# Patient Record
Sex: Male | Born: 1945 | Race: White | Hispanic: No | Smoking: Never smoker
Health system: Southern US, Community
[De-identification: ages and names within clinical notes are randomized; demographics above are authoritative.]

## PROBLEM LIST (undated history)

## (undated) DIAGNOSIS — H349 Unspecified retinal vascular occlusion: Secondary | ICD-10-CM

## (undated) DIAGNOSIS — I480 Paroxysmal atrial fibrillation: Secondary | ICD-10-CM

## (undated) DIAGNOSIS — E119 Type 2 diabetes mellitus without complications: Secondary | ICD-10-CM

## (undated) DIAGNOSIS — E785 Hyperlipidemia, unspecified: Secondary | ICD-10-CM

## (undated) DIAGNOSIS — F0390 Unspecified dementia without behavioral disturbance: Secondary | ICD-10-CM

## (undated) DIAGNOSIS — I2510 Atherosclerotic heart disease of native coronary artery without angina pectoris: Secondary | ICD-10-CM

## (undated) DIAGNOSIS — I469 Cardiac arrest, cause unspecified: Secondary | ICD-10-CM

## (undated) DIAGNOSIS — L570 Actinic keratosis: Secondary | ICD-10-CM

## (undated) DIAGNOSIS — I428 Other cardiomyopathies: Secondary | ICD-10-CM

## (undated) DIAGNOSIS — N200 Calculus of kidney: Secondary | ICD-10-CM

## (undated) DIAGNOSIS — I10 Essential (primary) hypertension: Secondary | ICD-10-CM

## (undated) DIAGNOSIS — Z95810 Presence of automatic (implantable) cardiac defibrillator: Secondary | ICD-10-CM

## (undated) DIAGNOSIS — M1990 Unspecified osteoarthritis, unspecified site: Secondary | ICD-10-CM

## (undated) DIAGNOSIS — C801 Malignant (primary) neoplasm, unspecified: Secondary | ICD-10-CM

## (undated) DIAGNOSIS — C439 Malignant melanoma of skin, unspecified: Secondary | ICD-10-CM

## (undated) DIAGNOSIS — I4891 Unspecified atrial fibrillation: Secondary | ICD-10-CM

## (undated) DIAGNOSIS — J45909 Unspecified asthma, uncomplicated: Secondary | ICD-10-CM

## (undated) HISTORY — PX: JOINT REPLACEMENT: SHX530

## (undated) HISTORY — PX: CYSTOSCOPY W/ LITHOLAPAXY / EHL: SUR377

## (undated) HISTORY — PX: VARICOSE VEIN SURGERY: SHX832

## (undated) HISTORY — DX: Paroxysmal atrial fibrillation: I48.0

## (undated) HISTORY — PX: TOTAL KNEE ARTHROPLASTY: SHX125

## (undated) HISTORY — PX: TOTAL KNEE ARTHROPLASTY WITH REVISION COMPONENTS: SHX6198

## (undated) HISTORY — DX: Unspecified retinal vascular occlusion: H34.9

## (undated) HISTORY — PX: OTHER SURGICAL HISTORY: SHX169

## (undated) HISTORY — PX: TONSILLECTOMY: SUR1361

## (undated) HISTORY — DX: Actinic keratosis: L57.0

---

## 2004-07-09 ENCOUNTER — Ambulatory Visit: Payer: Self-pay | Admitting: General Surgery

## 2004-08-08 ENCOUNTER — Ambulatory Visit: Payer: Self-pay

## 2006-09-10 ENCOUNTER — Other Ambulatory Visit: Payer: Self-pay

## 2006-09-10 ENCOUNTER — Ambulatory Visit: Payer: Self-pay | Admitting: Urology

## 2006-09-11 ENCOUNTER — Ambulatory Visit: Payer: Self-pay | Admitting: Urology

## 2006-10-02 ENCOUNTER — Ambulatory Visit: Payer: Self-pay | Admitting: Urology

## 2006-10-09 ENCOUNTER — Ambulatory Visit: Payer: Self-pay | Admitting: Urology

## 2008-08-29 DIAGNOSIS — C4491 Basal cell carcinoma of skin, unspecified: Secondary | ICD-10-CM

## 2008-08-29 HISTORY — DX: Basal cell carcinoma of skin, unspecified: C44.91

## 2008-11-28 ENCOUNTER — Ambulatory Visit: Payer: Self-pay | Admitting: Orthopedic Surgery

## 2008-12-01 ENCOUNTER — Inpatient Hospital Stay: Payer: Self-pay | Admitting: Orthopedic Surgery

## 2009-01-24 ENCOUNTER — Ambulatory Visit: Payer: Self-pay | Admitting: Orthopedic Surgery

## 2009-07-05 ENCOUNTER — Ambulatory Visit: Payer: Self-pay | Admitting: Orthopedic Surgery

## 2009-07-13 ENCOUNTER — Inpatient Hospital Stay: Payer: Self-pay | Admitting: Orthopedic Surgery

## 2009-10-19 ENCOUNTER — Ambulatory Visit: Payer: Self-pay | Admitting: Orthopedic Surgery

## 2010-12-24 ENCOUNTER — Ambulatory Visit: Payer: Self-pay | Admitting: Gastroenterology

## 2010-12-26 LAB — PATHOLOGY REPORT

## 2012-04-13 ENCOUNTER — Ambulatory Visit: Payer: Self-pay | Admitting: Gastroenterology

## 2012-04-14 LAB — PATHOLOGY REPORT

## 2014-03-30 ENCOUNTER — Other Ambulatory Visit: Payer: Self-pay | Admitting: Physician Assistant

## 2014-03-30 ENCOUNTER — Ambulatory Visit: Payer: Self-pay | Admitting: Student

## 2014-03-30 ENCOUNTER — Encounter (HOSPITAL_COMMUNITY): Payer: Self-pay | Admitting: Physician Assistant

## 2014-03-30 ENCOUNTER — Inpatient Hospital Stay (HOSPITAL_COMMUNITY)
Admission: AD | Admit: 2014-03-30 | Discharge: 2014-04-03 | DRG: 226 | Disposition: A | Discharge status: Home / Self Care | Payer: Medicare Other | Source: Other Acute Inpatient Hospital | Attending: Cardiology | Admitting: Cardiology

## 2014-03-30 ENCOUNTER — Encounter: Payer: Self-pay | Admitting: Cardiology

## 2014-03-30 DIAGNOSIS — E785 Hyperlipidemia, unspecified: Secondary | ICD-10-CM | POA: Diagnosis present

## 2014-03-30 DIAGNOSIS — Z96652 Presence of left artificial knee joint: Secondary | ICD-10-CM | POA: Diagnosis present

## 2014-03-30 DIAGNOSIS — J45909 Unspecified asthma, uncomplicated: Secondary | ICD-10-CM | POA: Diagnosis present

## 2014-03-30 DIAGNOSIS — I10 Essential (primary) hypertension: Secondary | ICD-10-CM | POA: Diagnosis present

## 2014-03-30 DIAGNOSIS — I4901 Ventricular fibrillation: Principal | ICD-10-CM | POA: Diagnosis present

## 2014-03-30 DIAGNOSIS — Z95810 Presence of automatic (implantable) cardiac defibrillator: Secondary | ICD-10-CM

## 2014-03-30 DIAGNOSIS — I469 Cardiac arrest, cause unspecified: Secondary | ICD-10-CM | POA: Diagnosis present

## 2014-03-30 DIAGNOSIS — I495 Sick sinus syndrome: Secondary | ICD-10-CM

## 2014-03-30 DIAGNOSIS — R7401 Elevation of levels of liver transaminase levels: Secondary | ICD-10-CM

## 2014-03-30 DIAGNOSIS — I429 Cardiomyopathy, unspecified: Secondary | ICD-10-CM | POA: Diagnosis present

## 2014-03-30 DIAGNOSIS — G9340 Encephalopathy, unspecified: Secondary | ICD-10-CM | POA: Diagnosis present

## 2014-03-30 DIAGNOSIS — R740 Nonspecific elevation of levels of transaminase and lactic acid dehydrogenase [LDH]: Secondary | ICD-10-CM | POA: Diagnosis present

## 2014-03-30 DIAGNOSIS — R9431 Abnormal electrocardiogram [ECG] [EKG]: Secondary | ICD-10-CM

## 2014-03-30 DIAGNOSIS — E119 Type 2 diabetes mellitus without complications: Secondary | ICD-10-CM | POA: Diagnosis present

## 2014-03-30 DIAGNOSIS — Z79899 Other long term (current) drug therapy: Secondary | ICD-10-CM

## 2014-03-30 DIAGNOSIS — I472 Ventricular tachycardia: Secondary | ICD-10-CM | POA: Diagnosis present

## 2014-03-30 DIAGNOSIS — I428 Other cardiomyopathies: Secondary | ICD-10-CM

## 2014-03-30 DIAGNOSIS — I2510 Atherosclerotic heart disease of native coronary artery without angina pectoris: Secondary | ICD-10-CM

## 2014-03-30 HISTORY — DX: Calculus of kidney: N20.0

## 2014-03-30 HISTORY — DX: Unspecified asthma, uncomplicated: J45.909

## 2014-03-30 HISTORY — DX: Essential (primary) hypertension: I10

## 2014-03-30 HISTORY — PX: CARDIOVERSION: SHX1299

## 2014-03-30 HISTORY — DX: Other cardiomyopathies: I42.8

## 2014-03-30 HISTORY — DX: Atherosclerotic heart disease of native coronary artery without angina pectoris: I25.10

## 2014-03-30 HISTORY — DX: Type 2 diabetes mellitus without complications: E11.9

## 2014-03-30 HISTORY — DX: Hyperlipidemia, unspecified: E78.5

## 2014-03-30 HISTORY — DX: Presence of automatic (implantable) cardiac defibrillator: Z95.810

## 2014-03-30 HISTORY — DX: Cardiac arrest, cause unspecified: I46.9

## 2014-03-30 HISTORY — DX: Unspecified osteoarthritis, unspecified site: M19.90

## 2014-03-30 HISTORY — PX: CARDIAC CATHETERIZATION: SHX172

## 2014-03-30 LAB — COMPREHENSIVE METABOLIC PANEL
ALBUMIN: 3.5 g/dL (ref 3.4–5.0)
ALK PHOS: 100 U/L
ALT: 165 U/L — AB
Anion Gap: 13 (ref 7–16)
BUN: 18 mg/dL (ref 7–18)
Bilirubin,Total: 2.2 mg/dL — ABNORMAL HIGH (ref 0.2–1.0)
CALCIUM: 8.1 mg/dL — AB (ref 8.5–10.1)
CHLORIDE: 105 mmol/L (ref 98–107)
CO2: 21 mmol/L (ref 21–32)
Creatinine: 1.15 mg/dL (ref 0.60–1.30)
EGFR (African American): >60
EGFR (Non-African Amer.): >60
Glucose: 191 mg/dL — ABNORMAL HIGH (ref 65–99)
Osmolality: 285 (ref 275–301)
Potassium: 3.5 mmol/L (ref 3.5–5.1)
SGOT(AST): 199 U/L — ABNORMAL HIGH (ref 15–37)
Sodium: 139 mmol/L (ref 136–145)
Total Protein: 6.5 g/dL (ref 6.4–8.2)

## 2014-03-30 LAB — CBC
HCT: 44.8 % (ref 40.0–52.0)
HCT: 41.6 % (ref 39.0–52.0)
HGB: 14.8 g/dL (ref 13.0–18.0)
Hemoglobin: 14.5 g/dL (ref 13.0–17.0)
MCH: 30.8 pg (ref 26.0–34.0)
MCH: 31.4 pg (ref 26.0–34.0)
MCHC: 33 g/dL (ref 32.0–36.0)
MCHC: 34.9 g/dL (ref 30.0–36.0)
MCV: 93 fL (ref 80–100)
MCV: 90 fL (ref 78.0–100.0)
PLATELETS: 225 10*3/uL (ref 150–440)
Platelets: 194 10*3/uL (ref 150–400)
RBC: 4.8 10*6/uL (ref 4.40–5.90)
RBC: 4.62 MIL/uL (ref 4.22–5.81)
RDW: 13.5 % (ref 11.5–14.5)
RDW: 13.1 % (ref 11.5–15.5)
WBC: 10.5 10*3/uL (ref 3.8–10.6)
WBC: 13.4 10*3/uL — ABNORMAL HIGH (ref 4.0–10.5)

## 2014-03-30 LAB — TROPONIN I: Troponin-I: 0.04 ng/mL

## 2014-03-30 LAB — PROTIME-INR
INR: 1.1
PROTHROMBIN TIME: 13.8 s (ref 11.5–14.7)

## 2014-03-30 LAB — GLUCOSE, CAPILLARY: Glucose-Capillary: 109 mg/dL — ABNORMAL HIGH (ref 70–99)

## 2014-03-30 LAB — CK TOTAL AND CKMB (NOT AT ARMC)
CK, Total: 107 U/L (ref 39–308)
CK-MB: 3.1 ng/mL (ref 0.5–3.6)

## 2014-03-30 LAB — CREATININE, SERUM
CREATININE: 1.17 mg/dL (ref 0.50–1.35)
GFR, EST AFRICAN AMERICAN: 71 mL/min — AB (ref >90)
GFR, EST NON AFRICAN AMERICAN: 61 mL/min — AB (ref >90)

## 2014-03-30 LAB — APTT: Activated PTT: 33.9 secs (ref 23.6–35.9)

## 2014-03-30 LAB — MRSA PCR SCREENING: MRSA BY PCR: NEGATIVE

## 2014-03-30 MED ORDER — NITROGLYCERIN 0.4 MG SL SUBL: 0.4000 mg | SUBLINGUAL_TABLET | SUBLINGUAL | Status: DC | PRN

## 2014-03-30 MED ORDER — BUDESONIDE-FORMOTEROL FUMARATE 160-4.5 MCG/ACT IN AERO
2.0000 | INHALATION_SPRAY | Freq: Two times a day (BID) | RESPIRATORY_TRACT | Status: DC
  Administered 2014-03-31 – 2014-04-03 (×6): 2 via RESPIRATORY_TRACT
  Filled 2014-03-30 (×2): qty 6

## 2014-03-30 MED ORDER — OXYCODONE-ACETAMINOPHEN 5-325 MG PO TABS
1.0000 | ORAL_TABLET | Freq: Four times a day (QID) | ORAL | Status: DC | PRN
  Administered 2014-03-30 – 2014-04-02 (×4): 1 via ORAL
  Filled 2014-03-30 (×5): qty 1

## 2014-03-30 MED ORDER — MORPHINE SULFATE 2 MG/ML IJ SOLN: 2.0000 mg | INTRAMUSCULAR | Status: DC | PRN

## 2014-03-30 MED ORDER — SODIUM CHLORIDE 0.9 % IJ SOLN: 3.0000 mL | INTRAMUSCULAR | Status: DC | PRN

## 2014-03-30 MED ORDER — LISINOPRIL 5 MG PO TABS
5.0000 mg | ORAL_TABLET | Freq: Every day | ORAL | Status: DC
  Filled 2014-03-30 (×2): qty 1

## 2014-03-30 MED ORDER — ACETAMINOPHEN 325 MG PO TABS
650.0000 mg | ORAL_TABLET | ORAL | Status: DC | PRN
  Administered 2014-03-30 – 2014-04-02 (×2): 650 mg via ORAL
  Filled 2014-03-30: qty 2

## 2014-03-30 MED ORDER — SODIUM CHLORIDE 0.9 % IV SOLN
250.0000 mL | INTRAVENOUS | Status: DC | PRN
  Administered 2014-03-30: 250 mL via INTRAVENOUS

## 2014-03-30 MED ORDER — SODIUM CHLORIDE 0.9 % IJ SOLN
3.0000 mL | Freq: Two times a day (BID) | INTRAMUSCULAR | Status: DC
Start: 2014-03-30 — End: 2014-04-03
  Administered 2014-03-30 – 2014-04-02 (×6): 3 mL via INTRAVENOUS

## 2014-03-30 MED ORDER — METFORMIN HCL 500 MG PO TABS
500.0000 mg | ORAL_TABLET | Freq: Two times a day (BID) | ORAL | Status: DC
  Administered 2014-04-01: 500 mg via ORAL
  Filled 2014-03-30 (×4): qty 1

## 2014-03-30 MED ORDER — ONDANSETRON HCL 4 MG/2ML IJ SOLN: 4.0000 mg | Freq: Four times a day (QID) | INTRAMUSCULAR | Status: DC | PRN

## 2014-03-30 MED ORDER — ONDANSETRON HCL 4 MG/2ML IJ SOLN
4.0000 mg | Freq: Four times a day (QID) | INTRAMUSCULAR | Status: DC | PRN
  Administered 2014-03-30: 4 mg via INTRAVENOUS

## 2014-03-30 MED ORDER — AMIODARONE HCL IN DEXTROSE 360-4.14 MG/200ML-% IV SOLN: 60.0000 mg/h | INTRAVENOUS | Status: DC

## 2014-03-30 MED ORDER — CETYLPYRIDINIUM CHLORIDE 0.05 % MT LIQD
7.0000 mL | Freq: Two times a day (BID) | OROMUCOSAL | Status: DC
  Administered 2014-03-30 – 2014-04-02 (×7): 7 mL via OROMUCOSAL

## 2014-03-30 MED ORDER — CARVEDILOL 3.125 MG PO TABS
3.1250 mg | ORAL_TABLET | Freq: Two times a day (BID) | ORAL | Status: DC
  Administered 2014-03-31 – 2014-04-03 (×7): 3.125 mg via ORAL
  Filled 2014-03-30 (×11): qty 1

## 2014-03-30 MED ORDER — AMIODARONE HCL IN DEXTROSE 360-4.14 MG/200ML-% IV SOLN: 30.0000 mg/h | INTRAVENOUS | Status: DC

## 2014-03-30 MED ORDER — DOCUSATE SODIUM 100 MG PO CAPS
100.0000 mg | ORAL_CAPSULE | Freq: Every day | ORAL | Status: DC
  Administered 2014-04-01 – 2014-04-03 (×3): 100 mg via ORAL
  Filled 2014-03-30 (×5): qty 1

## 2014-03-30 MED ORDER — ATORVASTATIN CALCIUM 40 MG PO TABS
40.0000 mg | ORAL_TABLET | Freq: Every day | ORAL | Status: DC
Start: 2014-03-30 — End: 2014-04-03
  Administered 2014-03-31 – 2014-04-02 (×2): 40 mg via ORAL
  Filled 2014-03-30 (×5): qty 1

## 2014-03-30 MED ORDER — HEPARIN SODIUM (PORCINE) 5000 UNIT/ML IJ SOLN
5000.0000 [IU] | Freq: Three times a day (TID) | INTRAMUSCULAR | Status: DC
  Administered 2014-03-30 – 2014-03-31 (×2): 5000 [IU] via SUBCUTANEOUS
  Filled 2014-03-30 (×5): qty 1

## 2014-03-30 MED ORDER — ONDANSETRON HCL 4 MG/2ML IJ SOLN
INTRAMUSCULAR | Status: AC
  Administered 2014-03-30: 4 mg via INTRAVENOUS
  Filled 2014-03-30: qty 2

## 2014-03-30 NOTE — H&P (Signed)
History and Physical  Patient ID: Ethan Vega MRN: [id number], DOB: [birth year] Date of Encounter: 03/30/2014, 2:29 PM Primary Physician: No primary care provider on file. Primary Cardiologist: New to [facility]  Chief Complaint: Cardiac arrest  Reason for Admission: Cardiac arrest (found in v-fib by EMS)  HPI: 69 y.o. male with no prior cardiac history, but with h/o DM2, HTN, HLD, and asthma who was [place] at the mall today with a head contusion and received CPR via bystander until EMS arrival. Upon EMS arrival he was found to be in V-fib, received shock x 2, went into V-tach, followed by NSR and was brought to [facility] ED.   Patient was in his usual state of health this morning while he was at the mall then all of the sudden he became dizzy and could no longer see. This was followed by him passing out. He suffered a head contusion with a laceration over the left eye in this process of him hitting the ground. He received CPR from a bystander at the mall. Upon EMS arrival he was found to be in V-fib. He received shock x 2, went into V-tach, followed by NSR and was brought to [facility] ED. EKG shows irregular rhythm, 101, RBBB, nonspecific st/t changes in lateral leads.   Upon his arrival to [facility] ED he received amiodarone bolus 150 mg and was placed on full-dose amiodarone gtt. He also received heparin bolus. Initial troponin 0.04. CK-MB 3.1. He was sent for head CT, stat read negative for bleed. While in CT he went back into v-tach. He was brought back into ED room 16. He was confused and lethargic. The decision was made to avoid sedation given his somnolence/lethargy. Given his recent cardiac arrest and urgent need for cardiac catheterization the choice was made for immediate synchronized cardioversion at 100 J. He promptly went into NSR with a rate in the 60s-70s. His mentation improved. Unfortunately, his vision remained poor. He was rebolused with 150 mg of amiodarone. He was taken to the cardiac cath lab.  He remained in contact with a provider.  Past Medical History  Diagnosis Date  . DM2 (diabetes mellitus, type 2)   . HTN (hypertension)   . HLD (hyperlipidemia)   . Asthma   . Coronary artery disease, non-occlusive     a. cath 03/2014: normal cors, EF 35%     Most Recent Cardiac Studies: Please see packet   Surgical History: No past surgical history on file.   Home Meds: Prior to Admission medications   Not on File    Allergies: Allergies not on file  History   Social History  . Marital Status: Divorced    Spouse Name: N/A    Number of Children: N/A  . Years of Education: N/A   Occupational History  . Not on file.   Social History Main Topics  . Smoking status: Not on file  . Smokeless tobacco: Not on file  . Alcohol Use: Not on file  . Drug Use: Not on file  . Sexual Activity: Not on file   Other Topics Concern  . Not on file   Social History Narrative  . No narrative on file     No family history on file.  Review of Systems: Unable to obtain 2/2 patient's mental status.   Labs: Please see packet that accompanied patient.     Radiology/Studies:  No results found.   EKG: irregular rhythm, 101, RBBB, nonspecific lateral st/t changes   Physical Exam: BP:  149/100, pulse 104, temp 97.2, resp 24, sat 97% General: Well developed, well nourished, critically ill appearing.  Head: Normocephalic, trauma over left eye, sclera non-icteric, no xanthomas, nares are without discharge.  Neck: Negative for carotid bruits. JVD not elevated. Lungs: Clear bilaterally to auscultation without wheezes, rales, or rhonchi. Breathing is unlabored. Heart: RRR with S1 S2. No murmurs, rubs, or gallops appreciated. Abdomen: Soft, non-tender, non-distended with normoactive bowel sounds. No hepatomegaly. No rebound/guarding. No obvious abdominal masses. Msk:  Strength and tone appear normal for age. Extremities: No clubbing or cyanosis. No edema.  Distal pedal pulses are 2+  and equal bilaterally. Neuro: Alert at times, lethargic mostly. No focal deficit. No facial asymmetry. Moves all extremities spontaneously.  Psych:  Lethargic.     ASSESSMENT AND PLAN:  69 year old male with no prior cardiac history, but with h/o HTN, HLD, and asthma who was found down with cardiac arrest at the mall today with a head contusion and received CPR via bystander until EMS arrival. Upon EMS arrival he was found to be in V-fib, received shock x 2, went into V-tach, followed by NSR and was brought to [facility] ED.   1. Cardiac arrest: -Back into v-tach while in CT, 2/2 his head trauma and lethargy sedation was held. Given his recent cardiac arrest, urgent need for cardiac cath, and declining response in mental status the decision to proceed with synchronized cardioversion at 100 J rather than rebolus of amiodarone was made. Called Dr. Ellyn Hack to discuss case with him who wanted patient out of v-tach then brought over to cath lab. Dr. Ellyn Hack came over to evaluate the patient in the ED as well. He converted to NSR at rate of 70s with 100J with synchronized cardioversion and remained there. -He was rebolused with 150 mg IV amiodarone -He was transported to cardiac cath lab  -Heparin gtt started in cath lab (head CT negative for bleed) -Cardiac cath showed normal coronaries with an EF of 35% -He will be transferred to [facility] for further evaluation including EP   2. Nonischemic cardiomyopathy: -EF 35% -Check echo -EP to evaluate -Coreg 3.125 mg bid and lisinopril 5 mg daily added  3. Encephalopathy: -Head CT negative -May need MRI  -Consider ETOH, UDS -No obvious signs of infection at this time  4. HTN: -Mildly elevated at presentation (149/100 during the above) -Monitor   5. HLD: -Change simvastatin to Lipitor 40 mg    6. Asthma: -Continue inhalers   Signed, DUNN,RYAN PA-C 03/30/2014, 2:29 PM   I personally saw & examined the patient @ [facility] ER - he had just been Cardioverted  for a recurrent run of Sustained VT.  He was quite confused, but awake & arousable.  No c/o CP besides from CPR. Apparently had suffered a witnessed arrest - on-looker CPR.  He fell & hit his head -- Head CT negative for bleed.  He was taken Emergently to the Cardiac CATH Lab to exclude ACS related ischemic VT.  To expedite matters (had a R wrist IV), a femoral approach was chosen.  Minimal CAD noted, but LV Gram EF ~35% --> cannot be sure if the cardiomyopathy preceded or was a result of VT Arrest.    With primary VT event & cardiomyopathy - needs EP evaluation.   Plan is transfer to [facility] for evaluation & treatment.  He was treated in ER with Amiodarone bolus & infusion, 2d bolus given for recurrent VT.  Post cath, he noted MSK related chest pain &  nausea.  Was given IV Morphine & BP/HR droppped wth nausea.  His BP dropped to 80s & HR to 40s in [place] -- Amiodarone infusion was stoppped.  IVF bolus & Zofran given.   Was stable for transport by [facility] to [facility].  Agree with the remainder of History, findings & exam / plan as noted above.   Leonie Man, M.D., M.S. Interventional Cardiologist   Pager # [phone]

## 2014-03-31 ENCOUNTER — Encounter (HOSPITAL_COMMUNITY): Payer: Self-pay | Admitting: *Deleted

## 2014-03-31 ENCOUNTER — Encounter (HOSPITAL_COMMUNITY)
Admission: AD | Disposition: A | Discharge status: Home / Self Care | Payer: Medicare Other | Source: Other Acute Inpatient Hospital | Attending: Interventional Cardiology

## 2014-03-31 ENCOUNTER — Encounter: Payer: Self-pay | Admitting: Cardiology

## 2014-03-31 DIAGNOSIS — I4901 Ventricular fibrillation: Principal | ICD-10-CM

## 2014-03-31 DIAGNOSIS — Z95810 Presence of automatic (implantable) cardiac defibrillator: Secondary | ICD-10-CM

## 2014-03-31 DIAGNOSIS — I059 Rheumatic mitral valve disease, unspecified: Secondary | ICD-10-CM

## 2014-03-31 DIAGNOSIS — I429 Cardiomyopathy, unspecified: Secondary | ICD-10-CM

## 2014-03-31 DIAGNOSIS — I469 Cardiac arrest, cause unspecified: Secondary | ICD-10-CM

## 2014-03-31 HISTORY — PX: IMPLANTABLE CARDIOVERTER DEFIBRILLATOR IMPLANT: SHX5473

## 2014-03-31 HISTORY — DX: Presence of automatic (implantable) cardiac defibrillator: Z95.810

## 2014-03-31 LAB — COMPREHENSIVE METABOLIC PANEL
ALK PHOS: 66 U/L (ref 39–117)
ALT: 104 U/L — ABNORMAL HIGH (ref 0–53)
AST: 112 U/L — AB (ref 0–37)
Albumin: 3.5 g/dL (ref 3.5–5.2)
Anion gap: 17 — ABNORMAL HIGH (ref 5–15)
BILIRUBIN TOTAL: 2.9 mg/dL — AB (ref 0.3–1.2)
BUN: 16 mg/dL (ref 6–23)
CO2: 25 mmol/L (ref 19–32)
Calcium: 9.5 mg/dL (ref 8.4–10.5)
Chloride: 99 mEq/L (ref 96–112)
Creatinine, Ser: 1.07 mg/dL (ref 0.50–1.35)
GFR calc Af Amer: 79 mL/min — ABNORMAL LOW (ref >90)
GFR calc non Af Amer: 68 mL/min — ABNORMAL LOW (ref >90)
Glucose, Bld: 124 mg/dL — ABNORMAL HIGH (ref 70–99)
POTASSIUM: 4.1 mmol/L (ref 3.5–5.1)
SODIUM: 141 mmol/L (ref 135–145)
Total Protein: 5.9 g/dL — ABNORMAL LOW (ref 6.0–8.3)

## 2014-03-31 LAB — HEMOGLOBIN A1C
HEMOGLOBIN A1C: 5.5 % (ref <5.7)
Mean Plasma Glucose: 111 mg/dL (ref <117)

## 2014-03-31 LAB — TSH: TSH: 0.985 u[IU]/mL (ref 0.350–4.500)

## 2014-03-31 LAB — GLUCOSE, CAPILLARY
Glucose-Capillary: 55 mg/dL — ABNORMAL LOW (ref 70–99)
Glucose-Capillary: 97 mg/dL (ref 70–99)

## 2014-03-31 LAB — BRAIN NATRIURETIC PEPTIDE: B NATRIURETIC PEPTIDE 5: 549.6 pg/mL — AB (ref 0.0–100.0)

## 2014-03-31 LAB — MAGNESIUM: MAGNESIUM: 1.9 mg/dL (ref 1.5–2.5)

## 2014-03-31 SURGERY — IMPLANTABLE CARDIOVERTER DEFIBRILLATOR IMPLANT
Anesthesia: LOCAL

## 2014-03-31 MED ORDER — FENTANYL CITRATE 0.05 MG/ML IJ SOLN
INTRAMUSCULAR | Status: AC
  Filled 2014-03-31: qty 2

## 2014-03-31 MED ORDER — ONDANSETRON HCL 4 MG/2ML IJ SOLN: 4.0000 mg | Freq: Four times a day (QID) | INTRAMUSCULAR | Status: DC | PRN

## 2014-03-31 MED ORDER — PERFLUTREN LIPID MICROSPHERE
INTRAVENOUS | Status: AC
  Filled 2014-03-31: qty 10

## 2014-03-31 MED ORDER — CHLORHEXIDINE GLUCONATE 4 % EX LIQD
60.0000 mL | Freq: Once | CUTANEOUS | Status: AC
  Administered 2014-03-31: 4 via TOPICAL
  Filled 2014-03-31: qty 60

## 2014-03-31 MED ORDER — LISINOPRIL 10 MG PO TABS
10.0000 mg | ORAL_TABLET | Freq: Every day | ORAL | Status: DC
  Administered 2014-04-01 – 2014-04-02 (×2): 10 mg via ORAL
  Filled 2014-03-31 (×2): qty 1

## 2014-03-31 MED ORDER — CEFAZOLIN SODIUM 1-5 GM-% IV SOLN
1.0000 g | Freq: Four times a day (QID) | INTRAVENOUS | Status: AC
  Administered 2014-03-31 – 2014-04-01 (×3): 1 g via INTRAVENOUS
  Filled 2014-03-31 (×3): qty 50

## 2014-03-31 MED ORDER — HEPARIN (PORCINE) IN NACL 2-0.9 UNIT/ML-% IJ SOLN
INTRAMUSCULAR | Status: AC
Start: 2014-03-31 — End: 2014-03-31
  Filled 2014-03-31: qty 500

## 2014-03-31 MED ORDER — SODIUM CHLORIDE 0.9 % IV SOLN
INTRAVENOUS | Status: DC
  Administered 2014-03-31: 09:00:00 via INTRAVENOUS

## 2014-03-31 MED ORDER — SODIUM CHLORIDE 0.9 % IR SOLN
80.0000 mg | Status: DC
  Filled 2014-03-31: qty 2

## 2014-03-31 MED ORDER — CEFAZOLIN SODIUM-DEXTROSE 2-3 GM-% IV SOLR
2.0000 g | INTRAVENOUS | Status: DC
  Filled 2014-03-31: qty 50

## 2014-03-31 MED ORDER — LIDOCAINE HCL (PF) 1 % IJ SOLN
INTRAMUSCULAR | Status: AC
  Filled 2014-03-31: qty 30

## 2014-03-31 MED ORDER — ACETAMINOPHEN 325 MG PO TABS
325.0000 mg | ORAL_TABLET | ORAL | Status: DC | PRN
Start: 2014-03-31 — End: 2014-04-03
  Administered 2014-04-01: 650 mg via ORAL
  Filled 2014-03-31 (×2): qty 2

## 2014-03-31 MED ORDER — MIDAZOLAM HCL 5 MG/5ML IJ SOLN
INTRAMUSCULAR | Status: AC
  Filled 2014-03-31: qty 5

## 2014-03-31 MED ORDER — PERFLUTREN LIPID MICROSPHERE
1.0000 mL | INTRAVENOUS | Status: AC | PRN
  Administered 2014-03-31: 2 mL via INTRAVENOUS
  Filled 2014-03-31: qty 10

## 2014-03-31 MED FILL — Ondansetron HCl Inj 4 MG/2ML (2 MG/ML): INTRAMUSCULAR | Qty: 2 | Status: AC

## 2014-03-31 NOTE — Progress Notes (Signed)
Percocet taken from pyxis around 2300 and was contaminated and had to be wasted in sharps. Witnessed by [place] RN. Patient became nauseated and vomited after drinking some water. Percocet was held and order obtained for Zofran IV. After nausea passed, percocet was given for pain.

## 2014-03-31 NOTE — Consult Note (Signed)
ELECTROPHYSIOLOGY CONSULT NOTE    Patient ID: Ethan Vega: [id number], DOB/AGE: [birth year] 69 y.o.  Admit date: 03/30/2014 Date of Consult: 03-31-2014  Primary Physician: Ethan Napoleon, MD Primary Cardiologist: new to [facility]  Reason for Consultation: cardiac arrest  HPI:  Ethan Vega is a 69 y.o. male with a past medical history significant for hypertension, hyperlipidemia, arthritis, and diabetes who suffered an OOH arrest on 03-30-14.  He was walking at the mall when he had a moment of dizziness and collapsed.  He received bystander CPR and EMS was called.  On their arrival, he was in VF and was defibrillated X2 and transferred to [facility].  He had recurrent VT while getting head CT and was cardioverted emergently and placed on amiodarone.  Cardiac catheterization demonstrated non obstructive CAD with an EF of 35%.  He had bradycardia on amiodarone with rates in the 40's and amio was discontinued.  He was transferred to [facility] for further evaluation.   Prior to this event, his activity is limited by arthritis in his knee.  He denies chest pain, shortness of breath, palpitations, pre-syncope. He has had one prior episode of syncope when he was 14 having dental work done.  His father died suddenly at age 71 of "heart attack" but no autopsy was done. There is no other family history of sudden death.    Echocardiogram is pending.  EP has been asked to evaluate for treatment options.  ROS is negative except as outlined above.   Past Medical History  Diagnosis Date  . HTN (hypertension)   . HLD (hyperlipidemia)   . Asthma   . Coronary artery disease, non-occlusive     a. cath 03/2014: normal cors, EF 35%  . Cardiac arrest 03/30/2014     found down at the mall with a head contusion and received CPR via bystander   . Heart murmur   . DM2 (diabetes mellitus, type 2)     "not since losing weight and exercising" (03/30/2014)  . Arthritis     "left knee" (03/30/2014)  .  Kidney stones      Surgical History:  Past Surgical History  Procedure Laterality Date  . Tonsillectomy      "as a kid"  . Total knee arthroplasty Left ~ 2010  . Total knee arthroplasty with revision components Left ~ 2011  . Joint replacement    . Cystoscopy w/ litholapaxy / ehl    . Cardioversion  03/30/2014  . Varicose vein surgery Left ~ 2014  . Cardiac catheterization  03/30/2014     Prescriptions prior to admission  Medication Sig Dispense Refill Last Dose  . lisinopril (PRINIVIL,ZESTRIL) 10 MG tablet Take 10 mg by mouth daily.   03/30/2014 at Unknown time  . meloxicam (MOBIC) 15 MG tablet Take 15 mg by mouth daily.   03/30/2014 at Unknown time    Inpatient Medications:  . antiseptic oral rinse  7 mL Mouth Rinse BID  . atorvastatin  40 mg Oral q1800  . budesonide-formoterol  2 puff Inhalation BID  . carvedilol  3.125 mg Oral BID WC  . docusate sodium  100 mg Oral Daily  . lisinopril  5 mg Oral Daily  . [START ON 04/01/2014] metFORMIN  500 mg Oral BID WC  . sodium chloride  3 mL Intravenous Q12H    Allergies: No Known Allergies  History   Social History  . Marital Status: Divorced    Spouse Name: N/A    Number of  Children: N/A  . Years of Education: N/A   Occupational History  . retired     Furniture conservator/restorer at [employer]  . Smoking status: Passive Smoke Exposure - Never Smoker  . Smokeless tobacco: Never Used  . Alcohol Use: No  . Drug Use: No  . Sexual Activity: Yes   Other Topics Concern  . Not on file   Social History Narrative     Family History  Problem Relation Age of Onset  . Esophageal cancer Mother     died at 27  . Heart attack Father     died at 43  . Hypertension Brother     BP 101/82 mmHg  Pulse 61  Temp(Src) 98.4 F (36.9 C) (Oral)  Resp 21  Ht 5\' 6"  (1.676 m)  Wt 201 lb 1 oz (91.2 kg)  BMI 32.47 kg/m2  SpO2 92%  Physical Exam: Well appearing 69 yo man, NAD HEENT: Unremarkable,[place], AT Neck:   6 JVD, no thyromegally Back:  No CVA tenderness Lungs:  Clear with no wheezes, rales, or rhonchi HEART:  Regular rate rhythm, no murmurs, no rubs, no clicks Abd:  soft, positive bowel sounds, no organomegally, no rebound, no guarding Ext:  2 plus pulses, no edema, no cyanosis, no clubbing Skin:  No rashes no nodules Neuro:  CN II through XII intact, motor grossly intact   Labs:   Lab Results  Component Value Date   WBC 13.4* 03/30/2014   HGB 14.5 03/30/2014   HCT 41.6 03/30/2014   MCV 90.0 03/30/2014   PLT 194 03/30/2014     Recent Labs Lab 03/30/14 1941  CREATININE 1.17    Radiology/Studies: No results found.  EKG:nsr  TELEMETRY: sinus rhythm with NSVT   A/P 1. VF arrest 2. Non-ischemic cm 3. HTN 4. Dyslipidemia 5. Sinus node dysfunction Rec: I have discussed the treatment options with the patient. The risks/benefits/goals/expectations of ICD implantation have been discussed with the patient and he wishes to proceed. He will require long term CHF meds. He was bradycardic on medical therapy and will need an atrial lead as well.  Mikle Bosworth.D.

## 2014-03-31 NOTE — Care Management Note (Signed)
    Page 1 of 1   03/31/2014     8:49:56 AM CARE MANAGEMENT NOTE 03/31/2014  Patient:  KUNIO, CUMMISKEY   Account Number:  [id number]  Date Initiated:  03/31/2014  Documentation initiated by:  Elissa Hefty  Subjective/Objective Assessment:   adm w cardiac arrest     Action/Plan:   lives alone, pcp dr Elijio Miles   Anticipated DC Date:     Anticipated DC Plan:  HOME/SELF CARE         Choice offered to / List presented to:             Status of service:   Medicare Important Message given?   (If response is "NO", the following Medicare IM given date fields will be blank) Date Medicare IM given:   Medicare IM given by:   Date Additional Medicare IM given:   Additional Medicare IM given by:    Discharge Disposition:    Per UR Regulation:  Reviewed for med. necessity/level of care/duration of stay  If discussed at [place] of Stay Meetings, dates discussed:    Comments:

## 2014-03-31 NOTE — Progress Notes (Signed)
ELECTROPHYSIOLOGY PROCEDURE DISCHARGE SUMMARY    Patient ID: Ethan Vega,  MRN: [id number], DOB/AGE: 11/22/1945 69 y.o.  Admit date: 03/30/2014 Discharge date: 04/01/2014  Primary Care Physician: Volanda Napoleon, MD Primary Cardiologist: Rockey Situ (will be new at follow-up) Electrophysiologist: Caryl Comes (to be followed in [facility])  Primary Discharge Diagnosis:  VF arrest status post ICD implant this admission  Secondary Discharge Diagnosis:  1.  Arthritis 2.  Diet controlled diabetes 3.  Hypertension  No Known Allergies   Procedures This Admission:  1.  Implantation of a STJ dual chamber ICD on 03-31-14 by Dr Lovena Le.  The patient received a STJ ICD with model number 2500 right atrial lead, 7122 right ventricular lead.  DFT's were successful at 51 J.  There were no immediate post procedure complications. 2.  CXR on 04-01-14 demonstrated no pneumothorax status post device implantation.   Brief HPI/Hospital Course:  Ethan Vega is a 69 y.o. male with a past medical history significant for hypertension, diabetes, and arthritis.  He was in his usual state of health on the day of admission when he suffered an OOH cardiac arrest.  Bystander CPR was started and EMS was called. On their arrival, he was in VF and was defibrillated.  He was taken to [facility] for further evaluation.  He had recurrent hemodynamically unstable VT while in CT scanner and underwent emergent cardioversion.  Catheterization demonstrated non obstructive CAD with EF of 35%.  He was transferred to [facility] for EP evaluation.  He was seen by Dr Lovena Le who recommended ICD implant for secondary prevention.  Due to bradycardia with medical therapy at [facility], dual chamber ICD implant was planned.  Risks, benefits, and alternatives to ICD implantation were reviewed with the patient who wished to proceed.   The patient underwent implantation of a STJ dual chamber ICD with details as outlined above.   He was monitored on  telemetry overnight which demonstrated sinus rhythm with 1 run of NSVT.  Left chest was without hematoma or ecchymosis.  The device was interrogated and found to be functioning normally.  CXR was obtained and demonstrated no pneumothorax status post device implantation.  Wound care, arm mobility, and restrictions were reviewed with the patient.  Dr Lovena Le examined the patient and considered them stable for discharge to home.   The patient's discharge medications include an ACE-I (Lisinopril) and beta blocker (Coreg).   He will need TOC visit in 7-10 days (staff message sent to schedule). No driving for 6 months-patient aware.   Discharge Vitals: Blood pressure 132/75, pulse 60, temperature 98.6 F (37 C), temperature source Oral, resp. rate 15, height 5\' 6"  (1.676 m), weight 199 lb 6.4 oz (90.447 kg), SpO2 99 %.   Well developed and nourished in no acute distress HENT normal Neck supple with JVP-flat Clear Device pocket without hematoma or erythema.   Regular rate and rhythm, no murmurs or gallops Abd-soft with active BS No Clubbing cyanosis edema Skin-warm and dry A & Oriented  Grossly normal sensory and motor function  Labs:   Lab Results  Component Value Date   WBC 13.4* 03/30/2014   HGB 14.5 03/30/2014   HCT 41.6 03/30/2014   MCV 90.0 03/30/2014   PLT 194 03/30/2014     Recent Labs Lab 03/31/14 0830  NA 141  K 4.1  CL 99  CO2 25  BUN 16  CREATININE 1.07  CALCIUM 9.5  PROT 5.9*  BILITOT 2.9*  ALKPHOS 66  ALT 104*  AST 112*  GLUCOSE 124*   ECG with QT interval of 500 msec  CXR some retraction of the RV ICD lead Discharge Medications:    Medication List    ASK your doctor about these medications        lisinopril 10 MG tablet  Commonly known as:  PRINIVIL,ZESTRIL  Take 10 mg by mouth daily.     meloxicam 15 MG tablet  Commonly known as:  MOBIC  Take 15 mg by mouth daily.       Active Problems:   Cardiac arrest   EF nearly normalized  We have  unexplained VF with modest QT prolongation and notably low voltage without evidence of LVh by echo or effusion  Will discuss with Dr Elliot Cousin re drug therapy  With LV dysfunction would anticpate beta blocker and with unexplained and freqeutn ( 2 episodes in 24 hrs without trigger) VF would anticipate antiarrhythmic therapy  Now with atrial lead  Coreg i nitiated  Will give flecainide challenge today then begin amiodarone and anticipate discharge on sundayu

## 2014-03-31 NOTE — Progress Notes (Signed)
Echocardiogram 2D Echocardiogram with Definity has been performed.  Babe Anthis 03/31/2014, 10:56 AM

## 2014-03-31 NOTE — Progress Notes (Addendum)
Sling applied to left arm. Instructions given. Awake on own. Talking coherently. IV saline locked.

## 2014-03-31 NOTE — CV Procedure (Signed)
   SURGEON:  Cristopher Peru, MD      PREPROCEDURE DIAGNOSES:   1. Non-Ischemic cardiomyopathy, EF 35%  2. Ventricular fibrillation cardiac arrest      POSTPROCEDURE DIAGNOSES:  Same as pre-procedure     PROCEDURES:    1. ICD implantation.  2. Defibrillation threshold testing     INTRODUCTION:  Ethan Vega is a 69 y.o. male with an non-ischemic CM (EF 35%), s/p VF arrest. At this time, he meets criteria for ICD implantation for secondary prevention of sudden death.  The patient has a narrow QRS and does not meet criteria for revascularization.   The patient therefore  presents today for ICD implantation.      DESCRIPTION OF PROCEDURE:  Informed written consent was obtained and the patient was brought to the electrophysiology lab in the fasting state. The patient was adequately sedated with intravenous Versed, and fentanyl as outlined in the nursing report.  The patient's left chest was prepped and draped in the usual sterile fashion by the EP lab staff.  The skin overlying the left deltopectoral region was infiltrated with lidocaine for local analgesia.  A 5-cm incision was made over the left deltopectoral region.  A left subcutaneous defibrillator pocket was fashioned using a combination of sharp and blunt dissection.  Electrocautery was used to assure hemostasis.   RA/RV Lead Placement: The left axillary vein was cannulated with fluoroscopic visualization.  No contrast was required for this endeavor.  Through the left axillary vein, a St. Jude [id number]  (serial # [id number]  ) right atrial lead and a St. Jude [id number] (serial number [id number]) right ventricular defibrillator lead were advanced with fluoroscopic visualization into the right atrial appendage and right ventricular apical septal positions respectively.  Initial atrial lead P-waves measured 3.1 mV with an impedance of 459 ohms and a threshold of 0.7 volts at 0.5 milliseconds.  The right ventricular lead R-wave measured 8.3 mV with impedance  of 629 ohms and a threshold of 0.7 volts at 0.5 milliseconds.   The leads were secured to the pectoralis  fascia using #2 silk suture over the suture sleeves.  The pocket then  irrigated with copious gentamicin solution.  The leads were then  connected to a St. Jude (serial  Number [id number]) ICD.  The defibrillator was placed into the  pocket.  The pocket was then closed in 2 layers with 2.0 Vicryl suture  for the subcutaneous and subcuticular layers.  Steri-Strips and a  sterile dressing were then applied.   DFT Testing: Defibrillation Threshold testing was then performed. Ventricular fibrillation was induced with the fibber mode.  Adequate sensing of ventricular fibrillation was observed with minimal dropout with a programmed sensitivity of 1.11mV.  The patient was successfully defibrillated to sinus rhythm with a single 15 joules shock delivered from the device with an impedance of 57 ohms in a duration of 5 seconds.  The patient went into atrial fibrillation before transitioning into NSR.  There were no early apparent complications.   CONCLUSIONS:   1. Non-Ischemic cardiomyopathy with chronic [place] Heart Association class I heart failure with a rescucitated VF arrest.   2. Successful ICD implantation.   3. DFT less than or equal to 15 joules.   4. No early apparent complications.   Cristopher Peru, MD  3:29 PM 03/31/2014

## 2014-03-31 NOTE — H&P (View-Only) (Signed)
ELECTROPHYSIOLOGY CONSULT NOTE    Patient ID: Ethan Vega MRN: [id number], DOB/AGE: 04/01/1945 69 y.o.  Admit date: 03/30/2014 Date of Consult: 03-31-2014  Primary Physician: Volanda Napoleon, MD Primary Cardiologist: new to [facility]  Reason for Consultation: cardiac arrest  HPI:  WADSWORTH SKOLNICK is a 69 y.o. male with a past medical history significant for hypertension, hyperlipidemia, arthritis, and diabetes who suffered an OOH arrest on 03-30-14.  [place] when he had a moment of dizziness and collapsed.  He received bystander CPR and EMS was called.  On their arrival, he was in VF and was defibrillated X2 and transferred to [facility].  He had recurrent VT while getting head CT and was cardioverted emergently and placed on amiodarone.  Cardiac catheterization demonstrated non obstructive CAD with an EF of 35%.  He had bradycardia on amiodarone with rates in the 40's and amio was discontinued.  He was transferred to [facility] for further evaluation.   Prior to this event, his activity is limited by arthritis in his knee.  He denies chest pain, shortness of breath, palpitations, pre-syncope. He has had one prior episode of syncope when he was 14 having dental work done.  His father died suddenly at age 5 of "heart attack" but no autopsy was done. There is no other family history of sudden death.    Echocardiogram is pending.  EP has been asked to evaluate for treatment options.  ROS is negative except as outlined above.   Past Medical History  Diagnosis Date  . HTN (hypertension)   . HLD (hyperlipidemia)   . Asthma   . Coronary artery disease, non-occlusive     a. cath 03/2014: normal cors, EF 35%  . Cardiac arrest 03/30/2014     [place] the [place] with a [place] contusion and received CPR via bystander   . Heart murmur   . DM2 (diabetes mellitus, type 2)     "not since losing weight and exercising" (03/30/2014)  . Arthritis     "left knee" (03/30/2014)  .  Kidney stones      Surgical History:  Past Surgical History  Procedure Laterality Date  . Tonsillectomy      "as a kid"  . Total knee arthroplasty Left ~ 2010  . Total knee arthroplasty with revision components Left ~ 2011  . Joint replacement    . Cystoscopy w/ litholapaxy / ehl    . Cardioversion  03/30/2014  . Varicose vein surgery Left ~ 2014  . Cardiac catheterization  03/30/2014     Prescriptions prior to admission  Medication Sig Dispense Refill Last Dose  . lisinopril (PRINIVIL,ZESTRIL) 10 MG tablet Take 10 mg by mouth daily.   03/30/2014 at Unknown time  . meloxicam (MOBIC) 15 MG tablet Take 15 mg by mouth daily.   03/30/2014 at Unknown time    Inpatient Medications:  . antiseptic oral rinse  7 mL Mouth Rinse BID  . atorvastatin  40 mg Oral q1800  . budesonide-formoterol  2 puff Inhalation BID  . carvedilol  3.125 mg Oral BID WC  . docusate sodium  100 mg Oral Daily  . lisinopril  5 mg Oral Daily  . [START ON 04/01/2014] metFORMIN  500 mg Oral BID WC  . sodium chloride  3 mL Intravenous Q12H    Allergies: No Known Allergies  History   Social History  . Marital Status: Divorced    Spouse Name: N/A    Number of  Children: N/A  . Years of Education: N/A   Occupational History  . retired     Furniture conservator/restorer at [employer] Topics  . Smoking status: Passive Smoke Exposure - Never Smoker  . Smokeless tobacco: Never Used  . Alcohol Use: No  . Drug Use: No  . Sexual Activity: Yes   Other Topics Concern  . Not on file   Social History Narrative     Family History  Problem Relation Age of Onset  . Esophageal cancer Mother     died at 29  . Heart attack Father     died at 28  . Hypertension Brother     BP 101/82 mmHg  Pulse 61  Temp(Src) 98.4 F (36.9 C) (Oral)  Resp 21  Ht 5\' 6"  (1.676 m)  Wt 201 lb 1 oz (91.2 kg)  BMI 32.47 kg/m2  SpO2 92%  Physical Exam: Well appearing 69 yo man, NAD HEENT: Unremarkable,[place], AT Neck:   6 JVD, no thyromegally Back:  No CVA tenderness Lungs:  Clear with no wheezes, rales, or rhonchi HEART:  Regular rate rhythm, no murmurs, no rubs, no clicks Abd:  soft, positive bowel sounds, no organomegally, no rebound, no guarding Ext:  2 plus pulses, no edema, no cyanosis, no clubbing Skin:  No rashes no nodules Neuro:  CN II through XII intact, motor grossly intact   Labs:   Lab Results  Component Value Date   WBC 13.4* 03/30/2014   HGB 14.5 03/30/2014   HCT 41.6 03/30/2014   MCV 90.0 03/30/2014   PLT 194 03/30/2014     Recent Labs Lab 03/30/14 1941  CREATININE 1.17    Radiology/Studies: No results found.  EKG:nsr  TELEMETRY: sinus rhythm with NSVT   A/P 1. VF arrest 2. Non-ischemic cm 3. HTN 4. Dyslipidemia 5. Sinus node dysfunction Rec: I have discussed the treatment options with the patient. The risks/benefits/goals/expectations of ICD implantation have been discussed with the patient and he wishes to proceed. He will require long term CHF meds. He was bradycardic on medical therapy and will need an atrial lead as well.  Mikle Bosworth.D.

## 2014-03-31 NOTE — Interval H&P Note (Signed)
History and Physical Interval Note:  03/31/2014 1:49 PM  Ethan Vega  has presented today for surgery, with the diagnosis of cardiac arrest  The various methods of treatment have been discussed with the patient and family. After consideration of risks, benefits and other options for treatment, the patient has consented to  Procedure(s): IMPLANTABLE CARDIOVERTER DEFIBRILLATOR IMPLANT (N/A) as a surgical intervention .  The patient's history has been reviewed, patient examined, no change in status, stable for surgery.  I have reviewed the patient's chart and labs.  Questions were answered to the patient's satisfaction.     Cristopher Peru

## 2014-04-01 ENCOUNTER — Inpatient Hospital Stay (HOSPITAL_COMMUNITY): Payer: Medicare Other

## 2014-04-01 ENCOUNTER — Other Ambulatory Visit: Payer: Self-pay

## 2014-04-01 ENCOUNTER — Encounter (HOSPITAL_COMMUNITY): Payer: Self-pay | Admitting: Internal Medicine

## 2014-04-01 DIAGNOSIS — R740 Nonspecific elevation of levels of transaminase and lactic acid dehydrogenase [LDH]: Secondary | ICD-10-CM

## 2014-04-01 DIAGNOSIS — R7401 Elevation of levels of liver transaminase levels: Secondary | ICD-10-CM

## 2014-04-01 DIAGNOSIS — I495 Sick sinus syndrome: Secondary | ICD-10-CM

## 2014-04-01 MED ORDER — FLECAINIDE ACETATE 100 MG PO TABS
300.0000 mg | ORAL_TABLET | Freq: Once | ORAL | Status: AC
  Administered 2014-04-01: 300 mg via ORAL
  Filled 2014-04-01: qty 3

## 2014-04-01 MED ORDER — AMIODARONE HCL 200 MG PO TABS
400.0000 mg | ORAL_TABLET | Freq: Two times a day (BID) | ORAL | Status: DC
  Administered 2014-04-01 – 2014-04-03 (×4): 400 mg via ORAL
  Filled 2014-04-01 (×4): qty 2

## 2014-04-01 NOTE — Progress Notes (Signed)
Patient was given warm prune juice to help relieve symptoms of constipation. States he has not had a bowel movement in 2 days and usually goes every morning. Starting to feel uncomfortable. Will ask MD for prn laxative as well. [employer]

## 2014-04-01 NOTE — Progress Notes (Signed)
Inpatient Diabetes Program Recommendations  AACE/ADA: New Consensus Statement on Inpatient Glycemic Control (2013)  Target Ranges:  Prepandial:   less than 140 mg/dL      Peak postprandial:   less than 180 mg/dL (1-2 hours)      Critically ill patients:  140 - 180 mg/dL   Reason for assessment:   Diabetes history: Type 2, A1C 5.5% Outpatient Diabetes medications: none Current orders for Inpatient glycemic control: Metformin 500mg  bid  Spoke with RN regarding the need for Metformin for this patient (written on discharge orders as well). A1C is 5.5% and patient has not been taking oral diabetes medications at home. RN will speak to physician before patient is discharged home to clarify orders and question the need for Metformin.    Gentry Fitz, RN, BA, MHA, CDE Diabetes Coordinator Inpatient Diabetes Program  [phone] (Team Pager) [phone] [facility]) 04/01/2014 11:06 AM

## 2014-04-01 NOTE — Progress Notes (Signed)
Pt ambulated in hall without walker, gait steady, HR upper 90s, denies chest pain, no SOB noted.

## 2014-04-02 LAB — HEPATIC FUNCTION PANEL
ALK PHOS: 61 U/L (ref 39–117)
ALT: 57 U/L — ABNORMAL HIGH (ref 0–53)
AST: 57 U/L — AB (ref 0–37)
Albumin: 3.6 g/dL (ref 3.5–5.2)
Bilirubin, Direct: 0.3 mg/dL (ref 0.0–0.3)
Indirect Bilirubin: 2.5 mg/dL — ABNORMAL HIGH (ref 0.3–0.9)
TOTAL PROTEIN: 5.9 g/dL — AB (ref 6.0–8.3)
Total Bilirubin: 2.8 mg/dL — ABNORMAL HIGH (ref 0.3–1.2)

## 2014-04-02 MED ORDER — ACETAMINOPHEN-CODEINE #3 300-30 MG PO TABS: 1.0000 | ORAL_TABLET | Freq: Four times a day (QID) | ORAL | Status: DC | PRN

## 2014-04-02 NOTE — Progress Notes (Signed)
Patient Name: Ethan Vega      SUBJECTIVE S/[ cardiac arrest Started amio Without nausea or other complaint  Past Medical History  Diagnosis Date  . HTN (hypertension)   . HLD (hyperlipidemia)   . Asthma   . Coronary artery disease, non-occlusive     a. cath 03/2014: normal cors, EF 35%  . Cardiac arrest 03/30/2014     [place] with a [place] contusion and received CPR via bystander   . Heart murmur   . DM2 (diabetes mellitus, type 2)     "not since losing weight and exercising" (03/30/2014)  . Arthritis     "left knee" (03/30/2014)  . Kidney stones     Scheduled Meds:  Scheduled Meds: . amiodarone  400 mg Oral BID  . antiseptic oral rinse  7 mL Mouth Rinse BID  . atorvastatin  40 mg Oral q1800  . budesonide-formoterol  2 puff Inhalation BID  . carvedilol  3.125 mg Oral BID WC  . docusate sodium  100 mg Oral Daily  . lisinopril  10 mg Oral Daily  . metFORMIN  500 mg Oral BID WC  . sodium chloride  3 mL Intravenous Q12H   Continuous Infusions:  sodium chloride, acetaminophen, acetaminophen, morphine injection, nitroGLYCERIN, ondansetron (ZOFRAN) IV, ondansetron (ZOFRAN) IV, oxyCODONE-acetaminophen, sodium chloride    PHYSICAL EXAM Filed Vitals:   04/01/14 2057 04/02/14 0000 04/02/14 0454 04/02/14 0823  BP:   143/90 137/81  Pulse: 71  60 62  Temp:  98.3 F (36.8 C) 98.2 F (36.8 C)   TempSrc:  Oral Oral   Resp: 12  18   Height:      Weight:   197 lb 12.8 oz (89.721 kg)   SpO2: 98%  98%     Well developed and nourished in no acute distress HENT normal Neck supple with JVP-flat Clear Regular rate and rhythm, no murmurs or gallops Abd-soft with active BS No Clubbing cyanosis edema Skin-warm and dry A & Oriented  Grossly normal sensory and motor function   TELEMETRY: Reviewed telemetry pt in NSR AND some atrial pacing    Intake/Output Summary (Last 24 hours) at 04/02/14 0913 Last data filed at 04/02/14 0848  Gross per 24 hour   Intake    480 ml  Output    575 ml  Net    -95 ml    LABS: Basic Metabolic Panel:  Recent Labs Lab 03/30/14 1941 03/31/14 0830  NA  --  141  K  --  4.1  CL  --  99  CO2  --  25  GLUCOSE  --  124*  BUN  --  16  CREATININE 1.17 1.07  CALCIUM  --  9.5  MG  --  1.9   Cardiac Enzymes: No results for input(s): CKTOTAL, CKMB, CKMBINDEX, TROPONINI in the last 72 hours. CBC:  Recent Labs Lab 03/30/14 1941  WBC 13.4*  HGB 14.5  HCT 41.6  MCV 90.0  PLT 194   PROTIME: No results for input(s): LABPROT, INR in the last 72 hours. Liver Function Tests:  Recent Labs  03/31/14 0830  AST 112*  ALT 104*  ALKPHOS 66  BILITOT 2.9*  PROT 5.9*  ALBUMIN 3.5   No results for input(s): LIPASE, AMYLASE in the last 72 hours. BNP: BNP (last 3 results) No results for input(s): PROBNP in the last 8760 hours. D-Dimer: No results for input(s): DDIMER in the last 72 hours. Hemoglobin A1C:  Recent Labs  03/31/14 0830  HGBA1C 5.5   Fasting Lipid Panel: No results for input(s): CHOL, HDL, LDLCALC, TRIG, CHOLHDL, LDLDIRECT in the last 72 hours. Thyroid Function Tests:  Recent Labs  03/31/14 0830  TSH 0.985    Flecainide challenge negative  QT unimpressive    Has very unusual voltage pattern with low volts ? Infiltrative myopathy  ASSESSMENT AND PLAN:  Active Problems:   Cardiac arrest   Sinus node dysfunction   Elevated transaminase level repeat tranaminases  Continue amio  400 bid x 2qwks 200 bisd x 2weeks The  200 qd  anticpate home on Sunday Will need LFTs at wound check NO QT PROLONGING DRUGS   Signed, Virl Axe MD  04/02/2014

## 2014-04-03 ENCOUNTER — Other Ambulatory Visit: Payer: Self-pay | Admitting: Physician Assistant

## 2014-04-03 ENCOUNTER — Encounter (HOSPITAL_COMMUNITY): Payer: Self-pay | Admitting: Physician Assistant

## 2014-04-03 DIAGNOSIS — Z79899 Other long term (current) drug therapy: Secondary | ICD-10-CM

## 2014-04-03 DIAGNOSIS — I10 Essential (primary) hypertension: Secondary | ICD-10-CM

## 2014-04-03 DIAGNOSIS — R9431 Abnormal electrocardiogram [ECG] [EKG]: Secondary | ICD-10-CM

## 2014-04-03 DIAGNOSIS — R7401 Elevation of levels of liver transaminase levels: Secondary | ICD-10-CM

## 2014-04-03 DIAGNOSIS — Z95810 Presence of automatic (implantable) cardiac defibrillator: Secondary | ICD-10-CM

## 2014-04-03 DIAGNOSIS — R740 Nonspecific elevation of levels of transaminase and lactic acid dehydrogenase [LDH]: Secondary | ICD-10-CM

## 2014-04-03 DIAGNOSIS — I428 Other cardiomyopathies: Secondary | ICD-10-CM

## 2014-04-03 DIAGNOSIS — Z5181 Encounter for therapeutic drug level monitoring: Secondary | ICD-10-CM

## 2014-04-03 DIAGNOSIS — I469 Cardiac arrest, cause unspecified: Secondary | ICD-10-CM

## 2014-04-03 MED ORDER — LISINOPRIL 10 MG PO TABS: 10.0000 mg | ORAL_TABLET | Freq: Two times a day (BID) | ORAL | Status: DC

## 2014-04-03 MED ORDER — AMIODARONE HCL 400 MG PO TABS: ORAL_TABLET | ORAL | Status: DC

## 2014-04-03 MED ORDER — LISINOPRIL 10 MG PO TABS
10.0000 mg | ORAL_TABLET | Freq: Two times a day (BID) | ORAL | Status: DC
  Administered 2014-04-03: 10 mg via ORAL
  Filled 2014-04-03: qty 1

## 2014-04-03 MED ORDER — ACETAMINOPHEN 500 MG PO TABS: 1000.0000 mg | ORAL_TABLET | Freq: Four times a day (QID) | ORAL | Status: DC | PRN

## 2014-04-03 MED ORDER — OXYCODONE HCL 5 MG PO TABS: 5.0000 mg | ORAL_TABLET | Freq: Four times a day (QID) | ORAL | Status: DC | PRN

## 2014-04-03 MED ORDER — ATORVASTATIN CALCIUM 40 MG PO TABS: 40.0000 mg | ORAL_TABLET | Freq: Every day | ORAL | Status: DC

## 2014-04-03 MED ORDER — BUDESONIDE-FORMOTEROL FUMARATE 160-4.5 MCG/ACT IN AERO: 2.0000 | INHALATION_SPRAY | Freq: Two times a day (BID) | RESPIRATORY_TRACT | Status: DC

## 2014-04-03 MED ORDER — CARVEDILOL 3.125 MG PO TABS
3.1250 mg | ORAL_TABLET | Freq: Two times a day (BID) | ORAL | Status: DC
Start: 2014-04-03 — End: 2014-04-11

## 2014-04-03 NOTE — Discharge Instructions (Signed)
° ° °  Supplemental Discharge Instructions for  Defibrillator Patients  Activity No heavy lifting or vigorous activity with your left arm for 6 to 8 weeks.  Do not raise your left  arm above your head for one week.  Gradually raise your affected arm as drawn below.          1/22                              1/23                        1/24                      1/25       NO DRIVING for 6 months     ; you may begin driving on   Dr. Caryl Comes will tell you when       . WOUND CARE - Keep the wound area clean and dry.  Do not get this area wet for one week. No showers for one week; you may shower on   04/08/14           . - The tape/steri-strips on your wound will fall off; do not pull them off.  No bandage is needed on the site.  DO  NOT apply any creams, oils, or ointments to the wound area. - If you notice any drainage or discharge from the wound, any swelling or bruising at the site, or you develop a fever > 101? F after you are discharged home, call the office at once.  Special Instructions - You are still able to use cellular telephones; use the ear opposite the side where you have your pacemaker/defibrillator.  Avoid carrying your cellular phone near your device. - When traveling through airports, show security personnel your identification card to avoid being screened in the metal detectors.  Ask the security personnel to use the hand wand. - Avoid arc welding equipment, MRI testing (magnetic resonance imaging), TENS units (transcutaneous nerve stimulators).  Call the office for questions about other devices. - Avoid electrical appliances that are in poor condition or are not properly grounded. - Microwave ovens are safe to be near or to operate.  Additional information for defibrillator patients should your device go off: - If your device goes off ONCE and you feel fine afterward, notify the device clinic nurses. - If your device goes off ONCE and you do not feel well afterward, call  911. - If your device goes off TWICE, call 911. - If your device goes off THREE times in one day, call 911.  DO NOT DRIVE YOURSELF OR A FAMILY MEMBER WITH A DEFIBRILLATOR TO THE HOSPITAL--CALL 911.

## 2014-04-03 NOTE — Progress Notes (Signed)
Primary cardiologist:  Dr. Glenetta Hew [facility]) Primary electrophysiologist:  Dr. Virl Axe [facility])   Subjective:    69 y.o. male with a history of DM 2, HTN, HL and asthma. Admitted 03/30/14 after suffering a V. fib arrest in the community. He received CPR and was defibrillated 2 by EMS.  He was originally taken to [facility]. He had recurrent VT while undergoing head CT. Head CT was negative for bleed. He was placed on IV amiodarone. LHC demonstrated no significant CAD. EF noted to be 35%. He developed bradycardia and amiodarone was stopped. He was transferred to [facility] for [facility] evaluation. Echo with improved LVF (EF 50-55%).  Evaluated by EP (Dr. Lovena Le). Felt to meet criteria for ICD.  He underwent dual-chamber AICD implantation 03/31/14.  CHF medications have been titrated. He is now on beta blocker and is also back on amiodarone. LFTs elevated on admit.  Improved now.  Discharge on 04/03/14 is anticipated.  Today continues to note chest soreness related to CPR. He denies dyspnea, nausea, dizziness.  Objective:   Temp:  [98.1 F (36.7 C)-98.7 F (37.1 C)] 98.4 F (36.9 C) (01/17 0515) Pulse Rate:  [59-62] 60 (01/17 0515) Resp:  [16-18] 18 (01/17 0515) BP: (104-143)/(56-88) 143/83 mmHg (01/17 0515) SpO2:  [95 %-98 %] 98 % (01/17 0515) Weight:  [197 lb 8 oz (89.585 kg)] 197 lb 8 oz (89.585 kg) (01/17 0515) Last BM Date: 04/02/14  Filed Weights   04/01/14 0400 04/02/14 0454 04/03/14 0515  Weight: 199 lb 6.4 oz (90.447 kg) 197 lb 12.8 oz (89.721 kg) 197 lb 8 oz (89.585 kg)    Intake/Output Summary (Last 24 hours) at 04/03/14 0805 Last data filed at 04/03/14 0514  Gross per 24 hour  Intake    480 ml  Output    800 ml  Net   -320 ml    Telemetry:  Atrial paced, 1 ventricular couplet  Exam:  General:  NAD  HEENT:  Unremarkable  Lungs:  Clear to auscultation bilaterally, no wheezing, no rales  Cardiac:  RRR, no murmur, no edema, no  JVD  Abdomen:  Soft  MSK: No deformities  Neuro: No deficits  Psych: Normal affect   Lab Results:  Basic Metabolic Panel:  Recent Labs Lab 03/30/14 1941 03/31/14 0830  NA  --  141  K  --  4.1  CL  --  99  CO2  --  25  GLUCOSE  --  124*  BUN  --  16  CREATININE 1.17 1.07  CALCIUM  --  9.5  MG  --  1.9    Liver Function Tests:  Recent Labs Lab 03/31/14 0830 04/02/14 1300  AST 112* 57*  ALT 104* 57*  ALKPHOS 66 61  BILITOT 2.9* 2.8*  PROT 5.9* 5.9*  ALBUMIN 3.5 3.6    CBC:  Recent Labs Lab 03/30/14 1941  WBC 13.4*  HGB 14.5  HCT 41.6  MCV 90.0  PLT 194    Cardiac Enzymes: No results for input(s): CKTOTAL, CKMB, CKMBINDEX, TROPONINI in the last 168 hours.  BNP: No results for input(s): PROBNP in the last 8760 hours.  Coagulation: No results for input(s): INR in the last 168 hours.    Medications:   Scheduled Medications: . amiodarone  400 mg Oral BID  . antiseptic oral rinse  7 mL Mouth Rinse BID  . atorvastatin  40 mg Oral q1800  . budesonide-formoterol  2 puff Inhalation BID  . carvedilol  3.125  mg Oral BID WC  . docusate sodium  100 mg Oral Daily  . lisinopril  10 mg Oral Daily  . metFORMIN  500 mg Oral BID WC  . sodium chloride  3 mL Intravenous Q12H    Infusions:    PRN Medications: sodium chloride, acetaminophen, acetaminophen, acetaminophen-codeine, morphine injection, nitroGLYCERIN, ondansetron (ZOFRAN) IV, ondansetron (ZOFRAN) IV, oxyCODONE-acetaminophen, sodium chloride   Assessment:    Principal Problem:   Cardiac arrest Active Problems:   NICM (nonischemic cardiomyopathy)   S/P implantation of automatic cardioverter/defibrillator (AICD)   Sinus node dysfunction/bradycardia   Elevated transaminase level   HTN (hypertension)   Prolonged Q-T interval on ECG     Plan/Discussion:    Doing well. Just remain sore from CPR. He will likely need a prescription for oxycodone at discharge.   BP borderline elevated.   Will increase Lisinopril to 10 mg twice daily.  Will need FU BMET at wound check. Check LFTs at wound check visit. Per Dr. Virl Axe, will DC on Amio 400 twice daily x 2 weeks, then 200 twice daily x 2 weeks, then 200 mg daily. Avoid QT prolonging drugs.  FU:  Device clinic on [facility]. 1/25 at 4 pm.  Dr. Caryl Comes in [facility] 4/19.  Will need to arrange FU with MD/PA in [place] office in next 2-3 weeks for FU on CHF.  Anticipate DC today after seen by attending Cardiologist.   Signed,  Richardson Dopp, PA-C   04/03/2014 8:05 AM Pager [phone]    Personally seen and examined. Agree with above. Doing well. Still having some post CPR chest pain. Recommend Tylenol. Short course of oxycodone. Deep breathing can be helpful to stretch. Muscles. Avoid stretching arm above head with recent defibrillator placement. Agree with increasing lisinopril  Candee Furbish, MD

## 2014-04-03 NOTE — Discharge Summary (Signed)
Discharge Summary   Patient ID: Ethan Vega, MRN: [id number], DOB/AGE: [birth year] 69 y.o.  Admit date: 03/30/2014 Discharge date: 04/03/2014   Primary Care Physician:  [name] AHMED   Primary Cardiologist:  Dr. Glenetta Hew [facility]) Primary Electrophysiologist:  Dr. Virl Axe [facility])  Reason for Admission:  Cardiac Arrest   Primary Discharge Diagnoses:  Principal Problem:   Cardiac arrest Active Problems:   NICM (nonischemic cardiomyopathy)   S/P implantation of automatic cardioverter/defibrillator (AICD)   Sinus node dysfunction/bradycardia   Elevated transaminase level   HTN (hypertension)   Prolonged Q-T interval on ECG     Wt Readings from Last 3 Encounters:  04/03/14 197 lb 8 oz (89.585 kg)    Secondary Discharge Diagnoses:   Past Medical History  Diagnosis Date  . HTN (hypertension)   . HLD (hyperlipidemia)   . Asthma   . Coronary artery disease, non-occlusive     a. cath 03/2014 at [facility]: no sig CAD (OM1 30%, CFX 30%), EF 35%  . Cardiac arrest 03/30/2014    VF arrest in setting of NICM >> CPR/defib in community >> s/p dual chamber AICD  . DM2 (diabetes mellitus, type 2)     "not since losing weight and exercising" (03/30/2014)  . Arthritis     "left knee" (03/30/2014)  . Kidney stones   . AICD (automatic cardioverter/defibrillator) present 03/31/14    Dr. Lovena Le - [employer] Jude (serial  Number [id number]) ICD  . NICM (nonischemic cardiomyopathy)     a. echo 1/16:  Inferolateral and lateral HK, EF 93-26%, grade 1 diastolic dysfunction, mild MR, mild LAE, mild RAE, trivial TR, no effusion      Allergies:   No Known Allergies    Procedures Performed This Admission:   1. Cardiac catheterization at [facility] 03/30/14: No significant CAD 2. Dual-chamber AICD implantation 03/31/14 by Dr. Thayer Ohm Course:  Ethan Vega is a 69 y.o. male with a history of DM 2, HTN, HL and asthma. He was admitted 03/30/14 after suffering a  V. fib arrest in the community. He received CPR and was defibrillated 2 by EMS. He was originally taken to [facility]. He had recurrent VT while undergoing head CT. Head CT was negative for bleed. He was placed on IV amiodarone. Emergent LHC demonstrated no significant CAD. EF noted to be 35%. He developed bradycardia and amiodarone was stopped. He was transferred to [facility] for further evaluation. Echo at [facility] demonstrated improved LVF (EF 50-55%). He was evaluated by EP (Dr. Lovena Le). He was felt to meet criteria for ICD. He underwent dual-chamber AICD implantation 03/31/14. CHF medications have been titrated. He is now on beta blocker, ACEI and is also back on amiodarone. LFTs were elevated on admision. They are improved now. He has had continued chest discomfort from CPR relieved by oxycodone.  QTc was noted to be somewhat prolonged. QT prolonging drugs are to be avoided. He will follow-up with Dr. Caryl Comes in [place] as well as Dr. Ellyn Hack. He will need follow-up LFTs and BMET in the next week. He was seen by Dr. Marlou Porch this morning and felt to be stable for discharge to home.    Discharge Vitals:   Blood pressure 141/77, pulse 60, temperature 98.4 F (36.9 C), temperature source Oral, resp. rate 18, height 5\' 6"  (1.676 m), weight 197 lb 8 oz (89.585 kg), SpO2 98 %.   Labs:    Lab Results  Component Value Date  WBC 13.4* 03/30/2014   HGB 14.5 03/30/2014   HCT 41.6 03/30/2014   MCV 90.0 03/30/2014   PLT 194 03/30/2014     Lab Results  Component Value Date   CREATININE 1.07 03/31/2014   BUN 16 03/31/2014   NA 141 03/31/2014   K 4.1 03/31/2014   CL 99 03/31/2014   CO2 25 03/31/2014     Recent Labs  04/02/14 1300  PROT 5.9*  BILITOT 2.8*  ALKPHOS 61  ALT 57*  AST 57*   B NATRIURETIC PEPTIDE  Date/Time Value Ref Range Status  03/31/2014 08:30 AM 549.6* 0.0 - 100.0 pg/mL Final    Comment:    Please note change in reference range.       Lab Results  Component Value Date   TSH 0.985 03/31/2014    Diagnostic Procedures and Studies:  Dg Chest 2 View  04/01/2014   CLINICAL DATA:  AICD.  EXAM: CHEST  2 VIEW  COMPARISON:  03/30/2014 to  FINDINGS: AICD noted with lead tips in right atrium and right ventricle. Cardiomegaly with normal pulmonary vascularity. No focal infiltrate. No pleural effusion or pneumothorax. Degenerative changes thoracic spine.  IMPRESSION: AICD noted with lead tips in right atrium right ventricle. Cardiomegaly. No CHF.   Electronically Signed   By: Marcello Moores  Register   On: 04/01/2014 07:50     2D Echocardiogram 03/31/14  Study Conclusions  - Left ventricle: There appears to be mid inferolateral and distal lateral hypokinesis, overall LVEF is low normal 50-55%. The cavity size was mildly dilated. Systolic function was normal.  Wall motion was normal; there were no regional wall motion abnormalities. Doppler parameters are consistent with abnormal left ventricular relaxation (grade 1 diastolic dysfunction).  There was no evidence of elevated ventricular filling pressure by Doppler parameters. - Aortic valve: Trileaflet; normal thickness leaflets. There was no regurgitation. - Aortic root: The aortic root was normal in size. - Mitral valve: Structurally normal valve. There was mild regurgitation. - Left atrium: The atrium was mildly dilated. - Right ventricle: Systolic function was normal. - Right atrium: The atrium was mildly dilated. - Tricuspid valve: There was trivial regurgitation. - Pulmonary arteries: Systolic pressure was within the normal range. - Inferior vena cava: The vessel was dilated. The respirophasic diameter changes were blunted (< 50%), consistent with elevated central venous pressure. - Pericardium, extracardiac: There was no pericardial effusion.    Disposition:   Pt is being discharged home today in good condition.  Follow-up Plans & Appointments      Follow-up Information     Follow up with [facility] On 04/11/2014.   Why:  4:00 PM - , For wound re-check; You will need labs drawn:  LFTs, BMET   Contact information:   [place] [phone]       Follow up with Christell Faith, PA-C In 2 weeks.   Specialty:  Physician Assistant   Why:  The office will contact you to arrange an appointment with the PA for Dr. Ellyn Hack in [facility] information:   [place] [address] [phone]       Follow up with Virl Axe, MD On 07/05/2014.   Specialty:  Cardiology   Why:  10:15 am   Contact information:   [address] [place] [address] [phone]       Follow up with Volanda Napoleon, MD.   Specialty:  Internal Medicine   Why:  as planned   Contact information:   [place] [address] [phone]       Discharge Medications    Medication List    STOP taking these medications        meloxicam 15 MG tablet  Commonly known as:  MOBIC      TAKE these medications        acetaminophen 500 MG tablet  Commonly known as:  TYLENOL  Take 2 tablets (1,000 mg total) by mouth every 6 (six) hours as needed for mild pain.     amiodarone 400 MG tablet  Commonly known as:  PACERONE  Take one tab (400 mg) twice a day for 2 weeks, then take 1/2 tab (200 mg) twice a day for 2 weeks, then take 1/2 tab (200 mg) once daily     atorvastatin 40 MG tablet  Commonly known as:  LIPITOR  Take 1 tablet (40 mg total) by mouth daily at 6 PM.     budesonide-formoterol 160-4.5 MCG/ACT inhaler  Commonly known as:  SYMBICORT  Inhale 2 puffs into the lungs 2 (two) times daily.     carvedilol 3.125 MG tablet  Commonly known as:  COREG  Take 1 tablet (3.125 mg total) by mouth 2 (two) times daily with a meal.     lisinopril 10 MG tablet  Commonly known as:  PRINIVIL,ZESTRIL  Take 1 tablet (10 mg total) by mouth 2 (two) times daily.      oxyCODONE 5 MG immediate release tablet  Commonly known as:  Oxy IR/ROXICODONE  Take 1 tablet (5 mg total) by mouth every 6 (six) hours as needed for severe pain.         Outstanding Labs/Studies  1. BMET and LFTs on 1/25/165   Duration of Discharge Encounter: Greater than 30 minutes including physician and PA time.  Signed, Richardson Dopp, PA-C   04/03/2014 11:12 AM

## 2014-04-05 ENCOUNTER — Encounter: Payer: Self-pay | Admitting: Cardiology

## 2014-04-11 ENCOUNTER — Encounter: Payer: Self-pay | Admitting: Nurse Practitioner

## 2014-04-11 ENCOUNTER — Ambulatory Visit (INDEPENDENT_AMBULATORY_CARE_PROVIDER_SITE_OTHER): Payer: Medicare Other | Admitting: *Deleted

## 2014-04-11 ENCOUNTER — Ambulatory Visit (INDEPENDENT_AMBULATORY_CARE_PROVIDER_SITE_OTHER): Payer: Medicare Other | Admitting: Nurse Practitioner

## 2014-04-11 VITALS — BP 168/86 | HR 60 | Ht 66.0 in | Wt 206.4 lb

## 2014-04-11 DIAGNOSIS — I4581 Long QT syndrome: Secondary | ICD-10-CM

## 2014-04-11 DIAGNOSIS — Z95810 Presence of automatic (implantable) cardiac defibrillator: Secondary | ICD-10-CM

## 2014-04-11 DIAGNOSIS — I429 Cardiomyopathy, unspecified: Secondary | ICD-10-CM

## 2014-04-11 DIAGNOSIS — I428 Other cardiomyopathies: Secondary | ICD-10-CM

## 2014-04-11 DIAGNOSIS — R9431 Abnormal electrocardiogram [ECG] [EKG]: Secondary | ICD-10-CM

## 2014-04-11 DIAGNOSIS — I472 Ventricular tachycardia, unspecified: Secondary | ICD-10-CM

## 2014-04-11 DIAGNOSIS — I469 Cardiac arrest, cause unspecified: Secondary | ICD-10-CM

## 2014-04-11 DIAGNOSIS — I495 Sick sinus syndrome: Secondary | ICD-10-CM

## 2014-04-11 DIAGNOSIS — Z9889 Other specified postprocedural states: Secondary | ICD-10-CM

## 2014-04-11 LAB — MDC_IDC_ENUM_SESS_TYPE_INCLINIC
Battery Remaining Longevity: 69.6 mo
Brady Statistic RV Percent Paced: 0.01 %
Date Time Interrogation Session: 20160125162032
HighPow Impedance: 52.875
Lead Channel Impedance Value: 437.5 Ohm
Lead Channel Pacing Threshold Amplitude: 0.75 V
Lead Channel Pacing Threshold Amplitude: 0.75 V
Lead Channel Pacing Threshold Amplitude: 0.75 V
Lead Channel Pacing Threshold Pulse Width: 0.5 ms
Lead Channel Sensing Intrinsic Amplitude: 10.3 mV
Lead Channel Setting Pacing Amplitude: 3.5 V
Lead Channel Setting Pacing Amplitude: 3.5 V
Lead Channel Setting Pacing Pulse Width: 0.5 ms
Lead Channel Setting Sensing Sensitivity: 0.5 mV
MDC IDC MSMT LEADCHNL RA PACING THRESHOLD AMPLITUDE: 0.75 V
MDC IDC MSMT LEADCHNL RA PACING THRESHOLD PULSEWIDTH: 0.5 ms
MDC IDC MSMT LEADCHNL RA PACING THRESHOLD PULSEWIDTH: 0.5 ms
MDC IDC MSMT LEADCHNL RA SENSING INTR AMPL: 2.9 mV
MDC IDC MSMT LEADCHNL RV IMPEDANCE VALUE: 462.5 Ohm
MDC IDC MSMT LEADCHNL RV PACING THRESHOLD PULSEWIDTH: 0.5 ms
MDC IDC PG SERIAL: 7241695
MDC IDC STAT BRADY RA PERCENT PACED: 93 %
Zone Setting Detection Interval: 250 ms
Zone Setting Detection Interval: 300 ms
Zone Setting Detection Interval: 350 ms

## 2014-04-11 MED ORDER — CARVEDILOL 6.25 MG PO TABS: 6.2500 mg | ORAL_TABLET | Freq: Two times a day (BID) | ORAL | Status: DC

## 2014-04-11 NOTE — Progress Notes (Signed)
CARDIOLOGY OFFICE NOTE  Date:  04/11/2014    [name] Date of Birth: [birth year] Medical Record #[id number]  PCP:  Volanda Napoleon, MD  Cardiologist:  Randa Spike   Chief Complaint  Patient presents with  . Cardiac Arrest    Post hospital/TOC visit - seen for Dr. Ellyn Hack & Caryl Comes   History of Present Illness: Ethan Vega is a 69 y.o. male who presents today for a TOC/post hospital visit. He has not had a TOC phone call however. He is seen for Dr. Caryl Comes & Ellyn Hack. He has a history of DM 2, HTN, HL and asthma.   He was admitted 03/30/14 after suffering a V. fib arrest in the community. [place] when he had a moment of dizziness and collapsed. He received bystander CPR and EMS was called. On their arrival, he was in VF and was defibrillated X2 and transferred to [facility]. He had recurrent VT while undergoing head CT. Head CT was negative for bleed. He was placed on IV amiodarone. Emergent LHC demonstrated no significant CAD. EF noted to be 35%. He developed bradycardia and amiodarone was stopped. He was transferred to [facility] for further evaluation. Echo at [facility] demonstrated improved LVF (EF 50-55%). He was evaluated by EP (Dr. Lovena Le). He was felt to meet criteria for ICD. He underwent dual-chamber AICD implantation 03/31/14.  He was discharged on on beta blocker, ACEI and put back on amiodarone. LFTs were elevated on admission but improved at discharge.  He has had continued chest discomfort from CPR relieved by oxycodone. QTc was noted to be somewhat prolonged. QT prolonging drugs are to be avoided.   Comes in today. Here alone. Home a little over a week. Has done ok. He is not driving. He understands that this is for 6 months. Chest discomfort is improving. Not using the narcotic. Not short of breath. Not dizzy or lightheaded. Does not have a BP monitor at home. Restricting his salt. Seems to have a good understanding about his medicines.  He does use SBE since he has had prior knee replacement. Needs a permanent crown in about 4 weeks. Seeing his PCP next month as well. No fever or chills. Limiting his movement of the left arm.    Past Medical History  Diagnosis Date  . HTN (hypertension)   . HLD (hyperlipidemia)   . Asthma   . Coronary artery disease, non-occlusive     a. cath 03/2014 at [facility]: no sig CAD (OM1 30%, CFX 30%), EF 35%  . Cardiac arrest 03/30/2014    VF arrest in setting of NICM >> CPR/defib in community >> s/p dual chamber AICD  . DM2 (diabetes mellitus, type 2)     "not since losing weight and exercising" (03/30/2014)  . Arthritis     "left knee" (03/30/2014)  . Kidney stones   . AICD (automatic cardioverter/defibrillator) present 03/31/14    Dr. Lovena Le - St. Jude (serial  Number [id number]) ICD  . NICM (nonischemic cardiomyopathy)     a. echo 1/16:  Inferolateral and lateral HK, EF 01-75%, grade 1 diastolic dysfunction, mild MR, mild LAE, mild RAE, trivial TR, no effusion    Past Surgical History  Procedure Laterality Date  . Tonsillectomy      "as a kid"  . Total knee arthroplasty Left ~ 2010  . Total knee arthroplasty with revision components Left ~ 2011  . Joint replacement    . Cystoscopy w/ litholapaxy /  ehl    . Cardioversion  03/30/2014  . Varicose vein surgery Left ~ 2014  . Cardiac catheterization  03/30/2014  . Implantable cardioverter defibrillator implant N/A 03/31/2014    Procedure: IMPLANTABLE CARDIOVERTER DEFIBRILLATOR IMPLANT;  Surgeon: Evans Lance, MD;  Location: [facility] CATH LAB;  Service: Cardiovascular;  Laterality: N/A;     Medications: Current Outpatient Prescriptions  Medication Sig Dispense Refill  . acetaminophen (TYLENOL) 500 MG tablet Take 2 tablets (1,000 mg total) by mouth every 6 (six) hours as needed for mild pain.    Marland Kitchen amiodarone (PACERONE) 400 MG tablet Take one tab (400 mg) twice a day for 2 weeks, then take 1/2 tab (200 mg) twice a day for 2 weeks, then take 1/2 tab (200  mg) once daily 60 tablet 6  . atorvastatin (LIPITOR) 40 MG tablet Take 1 tablet (40 mg total) by mouth daily at 6 PM. 30 tablet 5  . budesonide-formoterol (SYMBICORT) 160-4.5 MCG/ACT inhaler Inhale 2 puffs into the lungs 2 (two) times daily. 1 Inhaler 12  . carvedilol (COREG) 3.125 MG tablet Take 1 tablet (3.125 mg total) by mouth 2 (two) times daily with a meal. 60 tablet 5  . lisinopril (PRINIVIL,ZESTRIL) 10 MG tablet Take 1 tablet (10 mg total) by mouth 2 (two) times daily. 60 tablet 5  . oxyCODONE (OXY IR/ROXICODONE) 5 MG immediate release tablet Take 1 tablet (5 mg total) by mouth every 6 (six) hours as needed for severe pain. 30 tablet 0   No current facility-administered medications for this visit.    Allergies: No Known Allergies  Social History: The patient  reports that he has been passively smoking.  He has never used smokeless tobacco. He reports that he does not drink alcohol or use illicit drugs.   Family History: The patient's family history includes Esophageal cancer in his mother; Heart attack in his father; Hypertension in his brother.   Review of Systems: Please see the history of present illness.  All other systems are reviewed and negative.   Physical Exam: VS:  BP 168/86 mmHg  Pulse 60  Ht 5\' 6"  (1.676 m)  Wt 206 lb 6.4 oz (93.622 kg)  BMI 33.33 kg/m2 .  BMI Body mass index is 33.33 kg/(m^2).  BP by me is 150/80.  General: Pleasant. Well developed, well nourished and in no acute distress.  HEENT: Normal. Neck: Supple, no JVD, carotid bruits, or masses noted.  Cardiac: Regular rate and rhythm. No murmurs, rubs, or gallops. No edema. His ICD site looks good.  Respiratory:  Lungs are clear to auscultation bilaterally with normal work of breathing.  GI: Soft and nontender.  MS: No deformity or atrophy. Gait and ROM intact. Skin: Warm and dry. Color is normal.  Neuro:  Strength and sensation are intact and no gross focal deficits noted.  Psych: Alert,  appropriate and with normal affect.   Wt Readings from Last 3 Encounters:  04/11/14 206 lb 6.4 oz (93.622 kg)  04/03/14 197 lb 8 oz (89.585 kg)    LABORATORY DATA:  EKG:  EKG is ordered today.  The EKG ordered today demonstrates atrial pacing.  Lab Results  Component Value Date   WBC 13.4* 03/30/2014   HGB 14.5 03/30/2014   HCT 41.6 03/30/2014   PLT 194 03/30/2014   GLUCOSE 124* 03/31/2014   ALT 57* 04/02/2014   AST 57* 04/02/2014   NA 141 03/31/2014   K 4.1 03/31/2014   CL 99 03/31/2014   CREATININE 1.07 03/31/2014  BUN 16 03/31/2014   CO2 25 03/31/2014   TSH 0.985 03/31/2014   HGBA1C 5.5 03/31/2014    BNP (last 3 results) No results for input(s): PROBNP in the last 8760 hours.  Other Studies Reviewed Today:  Echo Study Conclusions from 03/31/2014  - Left ventricle: There appears to be mid inferolateral and distal lateral hypokinesis, overall LVEF is low normal 50-55%. The cavity size was mildly dilated. Systolic function was normal. Wall motion was normal; there were no regional wall motion abnormalities. Doppler parameters are consistent with abnormal left ventricular relaxation (grade 1 diastolic dysfunction). There was no evidence of elevated ventricular filling pressure by Doppler parameters. - Aortic valve: Trileaflet; normal thickness leaflets. There was no regurgitation. - Aortic root: The aortic root was normal in size. - Mitral valve: Structurally normal valve. There was mild regurgitation. - Left atrium: The atrium was mildly dilated. - Right ventricle: Systolic function was normal. - Right atrium: The atrium was mildly dilated. - Tricuspid valve: There was trivial regurgitation. - Pulmonary arteries: Systolic pressure was within the normal range. - Inferior vena cava: The vessel was dilated. The respirophasic diameter changes were blunted (< 50%), consistent with elevated central venous pressure. - Pericardium,  extracardiac: There was no pericardial effusion.  Assessment/Plan: 1. Post cardiac arrest - doing very well. Has his ICD in place - looks good. Wound check and device check to follow my visit.   2. S/P emergent cardiac cath - no significant CAD noted in the chart.  3. HTN - BP is up some - he is agreeable to checking some readings at home and getting his own cuff. I have increased his Coreg today to 6.25 mg BID. See back in 2 to 3 weeks.   4. Elevated LFTs - rechecking today.  5. High risk drug therapy - on decreasing doses of amiodarone.   6.  LV dysfunction - improved on echocardiogram - would continue with ACE, beta blocker  Current medicines are reviewed with the patient today.  Coreg is increased. We have reviewed the indications for his current regiman.   The following changes have been made:  See above  Labs/ tests ordered today include:    Orders Placed This Encounter  Procedures  . Basic metabolic panel  . Magnesium  . Hepatic function panel  . EKG 12-Lead     Disposition:   FU with me in 2 weeks. He has follow up with Dr. Caryl Comes. I will be happy to see back as well.   Patient is agreeable to this plan and will call if any problems develop in the interim.   Signed: Burtis Junes, RN, ANP-C 04/11/2014 3:19 PM  [facility] [address] [place], [place]  [address] Phone: [phone] Fax: [phone]

## 2014-04-11 NOTE — Progress Notes (Signed)
Wound check appointment. Steri-strips removed. Wound without redness or edema. Incision edges approximated, wound well healed. Normal device function. Thresholds, sensing, and impedances consistent with implant measurements. Device programmed at 3.5V for extra safety margin until 3 month visit. Histogram distribution appropriate for patient and level of activity. No mode switches or ventricular arrhythmias noted. Patient educated about wound care, arm mobility, lifting restrictions, shock plan. ROV on 4/19 @ 10:15am w/SK/B.

## 2014-04-11 NOTE — Patient Instructions (Addendum)
We will be checking the following labs today BMET & HPF  Stay on your current medicines but I am increasing the Coreg to 6.25 mg two times a day - you can take 2 of your 3.125 mg tabs twice a day and use those up - I have sent the 6.25 mg to your drug store  We will get your device checked today  Try to get your own BP cuff - Omron is the better brand - keep a diary  See Dr. Caryl Comes as planned in April at the [facility] office  See me in 2 to 3 weeks  Call the [place] office at [phone] if you have any questions, problems or concerns.

## 2014-04-12 ENCOUNTER — Telehealth: Payer: Self-pay

## 2014-04-12 LAB — HEPATIC FUNCTION PANEL
ALT: 31 U/L (ref 0–53)
AST: 29 U/L (ref 0–37)
Albumin: 4 g/dL (ref 3.5–5.2)
Alkaline Phosphatase: 117 U/L (ref 39–117)
Bilirubin, Direct: 0.3 mg/dL (ref 0.0–0.3)
Total Bilirubin: 1.3 mg/dL — ABNORMAL HIGH (ref 0.2–1.2)
Total Protein: 6.6 g/dL (ref 6.0–8.3)

## 2014-04-12 LAB — BASIC METABOLIC PANEL
BUN: 17 mg/dL (ref 6–23)
CO2: 29 mEq/L (ref 19–32)
Calcium: 9.4 mg/dL (ref 8.4–10.5)
Chloride: 104 mEq/L (ref 96–112)
Creatinine, Ser: 0.91 mg/dL (ref 0.40–1.50)
GFR: 87.35 mL/min (ref >60.00)
Glucose, Bld: 93 mg/dL (ref 70–99)
Potassium: 4.3 mEq/L (ref 3.5–5.1)
Sodium: 140 mEq/L (ref 135–145)

## 2014-04-12 LAB — MAGNESIUM: Magnesium: 2 mg/dL (ref 1.5–2.5)

## 2014-04-12 NOTE — Telephone Encounter (Signed)
Called pt to inform of lab results and he has questions about is device transmission box.  Pt is worried that something has changed and that it is not transmitting properly.  Per the pt "cell phone light on back of the box is usually red but sometime this afternoon changed to green", wants to make sure all is okay with box and device.

## 2014-04-13 NOTE — H&P (Signed)
  ICD Criteria  Current LVEF:35% ;Obtained < 1 month ago.  NYHA Functional Classification: Class II  Heart Failure History:  Yes, Duration of heart failure since onset is < 3 months  Non-Ischemic Dilated Cardiomyopathy History:  Yes, timeframe is < 3 months  Atrial Fibrillation/Atrial Flutter:  No.  Ventricular Tachycardia History:  Yes, Hemodynamic instability present, VT Type:  SVT - Monomorphic.  Cardiac Arrest History:  Yes, This was a Ventricular Tachycardia/Ventricular Fibrillation Arrest. This was NOT a bradycardia arrest.  History of Syndromes with Risk of Sudden Death:  No.  Previous ICD:  No.  Electrophysiology Study: No.  Prior MI: No.  PPM: No.  OSA:  No  Patient Life Expectancy of >=1 year: Yes.  Anticoagulation Therapy:  Patient is NOT on anticoagulation therapy.   Beta Blocker Therapy:  Yes.   Ace Inhibitor/ARB Therapy:  Yes.

## 2014-04-21 NOTE — Telephone Encounter (Signed)
Per pt, he contacted tech [employer] who advised him to keep his monitor near a window for reception. Pt complied.

## 2014-04-27 ENCOUNTER — Encounter: Payer: Self-pay | Admitting: Internal Medicine

## 2014-05-03 ENCOUNTER — Encounter: Payer: Self-pay | Admitting: Nurse Practitioner

## 2014-05-03 ENCOUNTER — Ambulatory Visit (INDEPENDENT_AMBULATORY_CARE_PROVIDER_SITE_OTHER): Payer: Medicare Other | Admitting: Nurse Practitioner

## 2014-05-03 ENCOUNTER — Encounter (INDEPENDENT_AMBULATORY_CARE_PROVIDER_SITE_OTHER): Payer: Self-pay

## 2014-05-03 VITALS — BP 138/82 | HR 60 | Ht 66.0 in | Wt 201.0 lb

## 2014-05-03 DIAGNOSIS — I10 Essential (primary) hypertension: Secondary | ICD-10-CM

## 2014-05-03 DIAGNOSIS — I2510 Atherosclerotic heart disease of native coronary artery without angina pectoris: Secondary | ICD-10-CM

## 2014-05-03 DIAGNOSIS — I429 Cardiomyopathy, unspecified: Secondary | ICD-10-CM

## 2014-05-03 DIAGNOSIS — I4581 Long QT syndrome: Secondary | ICD-10-CM

## 2014-05-03 DIAGNOSIS — I428 Other cardiomyopathies: Secondary | ICD-10-CM

## 2014-05-03 DIAGNOSIS — I469 Cardiac arrest, cause unspecified: Secondary | ICD-10-CM

## 2014-05-03 MED ORDER — LISINOPRIL 20 MG PO TABS: 20.0000 mg | ORAL_TABLET | Freq: Two times a day (BID) | ORAL | Status: DC

## 2014-05-03 NOTE — Patient Instructions (Signed)
Please increase your lisinopril to 20 mg twice daily  Your physician recommends that you schedule a follow-up appointment in: 3 months w/ Dr. Ellyn Hack

## 2014-05-03 NOTE — Progress Notes (Signed)
Patient Name: Ethan Vega. Date of Encounter: 05/03/2014  Primary Care Provider:  Volanda Napoleon, MD Primary Cardiologist:  Roni Bread, MD / S. Caryl Comes, MD   Chief Complaint  69 year old male status post VF arrest who presents for follow-up.  Past Medical History   Past Medical History  Diagnosis Date  . HTN (hypertension)   . HLD (hyperlipidemia)   . Asthma   . Coronary artery disease, non-occlusive     a. cath 03/2014 at [facility] no sig CAD (OM1 30%, CFX 30%), EF 35%  . Cardiac arrest     a. 03/30/2014 VF arrest in setting of NICM >> CPR/defib in community >> s/p dual chamber AICD  . DM2 (diabetes mellitus, type 2)     a. Diet controlled.  . Arthritis     a. L knee.  . Kidney stones   . AICD (automatic cardioverter/defibrillator) present 03/31/14    a. 03/2014 s/p SJM 2411-36C dual chamber AICD (serial Number [id number])- followed by Dr. Caryl Comes  . NICM (nonischemic cardiomyopathy)     a. 03/2014 Echo:  Inferolateral and lateral HK, EF 40-08%, grade 1 diastolic dysfunction, mild MR, mild LAE, mild RAE, trivial TR, no effusion.   Past Surgical History  Procedure Laterality Date  . Tonsillectomy      "as a kid"  . Total knee arthroplasty Left ~ 2010  . Total knee arthroplasty with revision components Left ~ 2011  . Joint replacement    . Cystoscopy w/ litholapaxy / ehl    . Cardioversion  03/30/2014  . Varicose vein surgery Left ~ 2014  . Cardiac catheterization  03/30/2014  . Implantable cardioverter defibrillator implant N/A 03/31/2014    Procedure: IMPLANTABLE CARDIOVERTER DEFIBRILLATOR IMPLANT;  Surgeon: Evans Lance, MD;  Location: [facility] CATH LAB;  Service: Cardiovascular;  Laterality: N/A;   Allergies  No Known Allergies  HPI  69 year old male with the above complex problem list. In mid January 2016, he suffered a VF arrest. He was resuscitated via bystander CPR and subsequent defibrillation. He underwent emergent catheterization revealing normal coronary  arteries at [facility] and was subsequent transferred to [facility] for further evaluation. Though he was noted to have LV dysfunction by left ventriculography during catheterization, follow-up echocardiogram a day later showed normal LV function with an EF 50-55%. He was seen by electrophysiology and decision was made to place an AICD and this was successfully performed. He was subsequently discharged and followed up in the office on January 25, at which time he was doing well. His carvedilol was titrated. He presents today for follow-up and reports that overall he has been doing well. He denies chest pain, dyspnea, PND, orthopnea, dizziness, syncope, palpitations, edema, or early satiety. He monitors his blood pressure at home regularly and notes that it runs in the high 130s to the 150s. His weight has been stable.  Home Medications  Prior to Admission medications   Medication Sig Start Date End Date Taking? Authorizing Provider  acetaminophen (TYLENOL) 500 MG tablet Take 2 tablets (1,000 mg total) by mouth every 6 (six) hours as needed for mild pain. 04/03/14  Yes Scott Joylene Draft, PA-C  amiodarone (PACERONE) 200 MG tablet Take 200 mg by mouth daily.   Yes Historical Provider, MD  atorvastatin (LIPITOR) 40 MG tablet Take 1 tablet (40 mg total) by mouth daily at 6 PM. 04/03/14  Yes Scott T Kathlen Mody, PA-C  budesonide-formoterol (SYMBICORT) 160-4.5 MCG/ACT inhaler Inhale 2 puffs into the lungs 2 (two) times daily. 04/03/14  Yes Liliane Shi, PA-C  carvedilol (COREG) 6.25 MG tablet Take 1 tablet (6.25 mg total) by mouth 2 (two) times daily. 04/11/14  Yes Burtis Junes, NP  lisinopril (PRINIVIL,ZESTRIL) 20 MG tablet Take 1 tablet (20 mg total) by mouth 2 (two) times daily. 05/03/14  Yes Rogelia Mire, NP  oxyCODONE (OXY IR/ROXICODONE) 5 MG immediate release tablet Take 1 tablet (5 mg total) by mouth every 6 (six) hours as needed for severe pain. 04/03/14  Yes Liliane Shi, PA-C    Review of Systems  He  denies chest pain, palpitations, dyspnea, pnd, orthopnea, n, v, dizziness, syncope, edema, weight gain, or early satiety. All other systems reviewed and are otherwise negative except as noted above.  Physical Exam  VS:  BP 138/82 mmHg  Pulse 60  Ht 5\' 6"  (1.676 m)  Wt 201 lb (91.173 kg)  BMI 32.46 kg/m2 , BMI Body mass index is 32.46 kg/(m^2). GEN: Well nourished, well developed, in no acute distress. HEENT: normal. Neck: Supple, no JVD, carotid bruits, or masses. Cardiac: RRR, no murmurs, rubs, or gallops. No clubbing, cyanosis, edema.  Radials/DP/PT 2+ and equal bilaterally.  Respiratory:  Respirations regular and unlabored, clear to auscultation bilaterally. GI: Soft, nontender, nondistended, BS + x 4. MS: no deformity or atrophy. Skin: warm and dry, no rash. Neuro:  Strength and sensation are intact. Psych: Normal affect.  Accessory Clinical Findings  ECG - a paced, V sensed, 60, IVCD. Nonspecific ST and T changes.  Assessment & Plan  1.  Cardiac arrest/VF arrest: Status post ICD placement. He remains on amiodarone and carvedilol therapy. Amiodarone has been reduced to 200 mg daily previously. He appears to be tolerating this well. He had repeat liver function testing in January and this was normal. TSH was also normal on January 14. He will require outpatient pulmonary function testing at some point.  He has follow-up with electrophysiology in April.  2. Nonischemic cardiomyopathy: Patient had normal coronary arteries by catheterization in January. LV function was initially measured at 30-35% by left ventriculography on the day of the event that an echo a day later showed normal LV function. He remains on beta blocker and ACE inhibitor therapy and is euvolemic on exam. I will titrate his ACE inhibitor 20 mg twice a day as his blood pressures at home have been running in the 130s to 150s.  3. Hypertension: As above, I am increasing lisinopril to 20 mg twice a day.   4.  Hyperlipidemia: Continue statin therapy. LFTs normalized in late January.  5. Disposition: Follow-up with Dr. Caryl Comes as scheduled in April. We will arrange for follow-up with Dr. Ellyn Hack here in 3 months or sooner if necessary.  Murray Hodgkins, NP 05/03/2014, 4:02 PM

## 2014-05-06 ENCOUNTER — Ambulatory Visit: Payer: Medicare Other | Admitting: Nurse Practitioner

## 2014-05-13 ENCOUNTER — Ambulatory Visit: Payer: Medicare Other | Admitting: Nurse Practitioner

## 2014-05-30 ENCOUNTER — Telehealth: Payer: Self-pay | Admitting: *Deleted

## 2014-05-30 NOTE — Telephone Encounter (Signed)
Pt calling stating that PCP told pt that he may need [place] to re look at Lipitor  For the mg might be a bit high Pt is running out soon and does not want to reorder them if they are to be lowered in dosage.  Please call patient.

## 2014-05-31 NOTE — Telephone Encounter (Signed)
Patient stated that he had his labs done with his PCP and they feel his dose of Lipitor might be to high  They advised him to check with [place]  Patient is having lab results faxed to [place] today

## 2014-06-03 NOTE — Telephone Encounter (Signed)
Pt calling to make sure of the doses that were given to him.  I Told him what I saw in his chart  and explained to him the mg on each pill. If he had any questions he would call us back.

## 2014-07-05 ENCOUNTER — Encounter: Payer: Self-pay | Admitting: Internal Medicine

## 2014-07-05 ENCOUNTER — Ambulatory Visit (INDEPENDENT_AMBULATORY_CARE_PROVIDER_SITE_OTHER): Payer: Medicare Other | Admitting: Internal Medicine

## 2014-07-05 VITALS — BP 144/90 | HR 60 | Ht 66.0 in | Wt 213.0 lb

## 2014-07-05 DIAGNOSIS — I4581 Long QT syndrome: Secondary | ICD-10-CM | POA: Diagnosis not present

## 2014-07-05 DIAGNOSIS — I469 Cardiac arrest, cause unspecified: Secondary | ICD-10-CM

## 2014-07-05 DIAGNOSIS — I428 Other cardiomyopathies: Secondary | ICD-10-CM

## 2014-07-05 DIAGNOSIS — I495 Sick sinus syndrome: Secondary | ICD-10-CM | POA: Diagnosis not present

## 2014-07-05 DIAGNOSIS — I429 Cardiomyopathy, unspecified: Secondary | ICD-10-CM

## 2014-07-05 DIAGNOSIS — R9431 Abnormal electrocardiogram [ECG] [EKG]: Secondary | ICD-10-CM

## 2014-07-05 LAB — MDC_IDC_ENUM_SESS_TYPE_INCLINIC
Battery Remaining Longevity: 87.6 mo
Brady Statistic RV Percent Paced: 0 %
HighPow Impedance: 60.75 Ohm
Implantable Pulse Generator Serial Number: 7241695
Lead Channel Impedance Value: 437.5 Ohm
Lead Channel Pacing Threshold Amplitude: 1 V
Lead Channel Pacing Threshold Amplitude: 1 V
Lead Channel Pacing Threshold Pulse Width: 0.5 ms
Lead Channel Pacing Threshold Pulse Width: 0.5 ms
Lead Channel Pacing Threshold Pulse Width: 0.5 ms
Lead Channel Sensing Intrinsic Amplitude: 11.7 mV
Lead Channel Sensing Intrinsic Amplitude: 2.4 mV
Lead Channel Setting Pacing Amplitude: 2 V
Lead Channel Setting Pacing Amplitude: 2.5 V
Lead Channel Setting Sensing Sensitivity: 0.5 mV
MDC IDC MSMT LEADCHNL RA PACING THRESHOLD PULSEWIDTH: 0.5 ms
MDC IDC MSMT LEADCHNL RV IMPEDANCE VALUE: 462.5 Ohm
MDC IDC MSMT LEADCHNL RV PACING THRESHOLD AMPLITUDE: 0.75 V
MDC IDC MSMT LEADCHNL RV PACING THRESHOLD AMPLITUDE: 0.75 V
MDC IDC SESS DTM: 20160419104511
MDC IDC SET LEADCHNL RV PACING PULSEWIDTH: 0.5 ms
MDC IDC SET ZONE DETECTION INTERVAL: 250 ms
MDC IDC SET ZONE DETECTION INTERVAL: 300 ms
MDC IDC SET ZONE DETECTION INTERVAL: 350 ms
MDC IDC STAT BRADY RA PERCENT PACED: 97 %

## 2014-07-05 NOTE — Patient Instructions (Addendum)
Medication Instructions: 1) Decrease amiodarone to 100 mg daily 2) Increase lisinopril to 20 mg one tablet by mouth twice daily  Labwork: Your physician recommends that you have lab work today: CMET/ TSH  Procedures/Testing: Your physician has requested that you have a resting thallium study. Please follow instruction sheet, as given.  Your physician has requested that you have an echocardiogram with Bubble Study. Echocardiography is a painless test that uses sound waves to create images of your heart. It provides your doctor with information about the size and shape of your heart and how well your heart's chambers and valves are working. This procedure takes approximately one hour. There are no restrictions for this procedure.  Follow-Up: Your physician recommends that you schedule a follow-up appointment in: 4 months with Dr. Caryl Comes.  Any Additional Special Instructions Will Be Listed Below (If Applicable). - None    [facility] MYOVIEW  Your caregiver has ordered a Stress Test with nuclear imaging. The purpose of this test is to evaluate the blood supply to your heart muscle. This procedure is referred to as a "Non-Invasive Stress Test." This is because other than having an IV started in your vein, nothing is inserted or "invades" your body. Cardiac stress tests are done to find areas of poor blood flow to the heart by determining the extent of coronary artery disease (CAD). Some patients exercise on a treadmill, which naturally increases the blood flow to your heart, while others who are  unable to walk on a treadmill due to physical limitations have a pharmacologic/chemical stress agent called Lexiscan . This medicine will mimic walking on a treadmill by temporarily increasing your coronary blood flow.   Please note: these test may take anywhere between 2-4 hours to complete  PLEASE REPORT TO [place] AT THE FIRST DESK WILL DIRECT YOU WHERE TO GO  Date of  Procedure:_____Tuesday 4/26/16________________________________  Arrival Time for Procedure:________7:45 am______________________  Instructions regarding medication:   ____ : Hold diabetes medication morning of procedure  _x___:  Hold betablocker(s) night before procedure and morning of procedure (Carvedilol)  ____:  Hold other medications as follows:_________________________________________________________________________________________________________________________________________________________________________________________________________________________________________________________________________________________  PLEASE NOTIFY THE OFFICE AT LEAST 24 HOURS IN ADVANCE IF YOU ARE UNABLE TO KEEP YOUR APPOINTMENT.  [phone] AND  PLEASE NOTIFY NUCLEAR MEDICINE AT [facility] AT LEAST 24 HOURS IN ADVANCE IF YOU ARE UNABLE TO KEEP YOUR APPOINTMENT. [phone]  How to prepare for your Myoview test:  1. Do not eat or drink after midnight 2. No caffeine for 24 hours prior to test 3. No smoking 24 hours prior to test. 4. Your medication may be taken with water.  If your doctor stopped a medication because of this test, do not take that medication. 5. Ladies, please do not wear dresses.  Skirts or pants are appropriate. Please wear a short sleeve shirt. 6. No perfume, cologne or lotion. 7. Wear comfortable walking shoes. No heels!

## 2014-07-05 NOTE — Progress Notes (Signed)
Patient Care Team: Jodi Marble, MD as PCP - General (Internal Medicine)   HPI  Ethan Vega. is a 69 y.o. male Seen following ICD implantation 1/16 following aborted cardiac arrest. Cardiac evaluation at that time included an echocardiogram demonstrating normal left ventricular function had recovered from the EF of 30-35% range. Catheterization 1/16 demonstrated no significant coronary disease Flecainide challenge was negative ECG  Low Volts R-V1 with RAD with mild RA enlargement SAECG done but results not in EPIC  He was discharged with amiodarone beta blockers and Ace inhibitors  He denies significant exercise tolerance  Has long hx of bradycardia  Past Medical History  Diagnosis Date  . HTN (hypertension)   . HLD (hyperlipidemia)   . Asthma   . Coronary artery disease, non-occlusive     a. cath 03/2014 at [facility]: no sig CAD (OM1 30%, CFX 30%), EF 35%  . Cardiac arrest     a. 03/30/2014 VF arrest in setting of NICM >> CPR/defib in community >> s/p dual chamber AICD  . DM2 (diabetes mellitus, type 2)     a. Diet controlled.  . Arthritis     a. L knee.  . Kidney stones   . AICD (automatic cardioverter/defibrillator) present 03/31/14    a. 03/2014 s/p SJM 2411-36C dual chamber AICD (serial Number [id number])- followed by Dr. Caryl Comes  . NICM (nonischemic cardiomyopathy)     a. 03/2014 Echo:  Inferolateral and lateral HK, EF 42-35%, grade 1 diastolic dysfunction, mild MR, mild LAE, mild RAE, trivial TR, no effusion.    Past Surgical History  Procedure Laterality Date  . Tonsillectomy      "as a kid"  . Total knee arthroplasty Left ~ 2010  . Total knee arthroplasty with revision components Left ~ 2011  . Joint replacement    . Cystoscopy w/ litholapaxy / ehl    . Cardioversion  03/30/2014  . Varicose vein surgery Left ~ 2014  . Cardiac catheterization  03/30/2014  . Implantable cardioverter defibrillator implant N/A 03/31/2014    Procedure: IMPLANTABLE CARDIOVERTER  DEFIBRILLATOR IMPLANT;  Surgeon: Evans Lance, MD;  Location: [facility] CATH LAB;  Service: Cardiovascular;  Laterality: N/A;    Current Outpatient Prescriptions  Medication Sig Dispense Refill  . amiodarone (PACERONE) 200 MG tablet Take 200 mg by mouth daily.    Marland Kitchen atorvastatin (LIPITOR) 40 MG tablet Take 1 tablet (40 mg total) by mouth daily at 6 PM. 30 tablet 5  . budesonide-formoterol (SYMBICORT) 160-4.5 MCG/ACT inhaler Inhale 2 puffs into the lungs 2 (two) times daily. (Patient taking differently: Inhale 2 puffs into the lungs as needed. ) 1 Inhaler 12  . carvedilol (COREG) 6.25 MG tablet Take 1 tablet (6.25 mg total) by mouth 2 (two) times daily. 60 tablet 3  . lisinopril (PRINIVIL,ZESTRIL) 20 MG tablet Take 1 tablet (20 mg total) by mouth 2 (two) times daily. (Patient taking differently: Take 20 mg by mouth daily. ) 60 tablet 6  . oxyCODONE (OXY IR/ROXICODONE) 5 MG immediate release tablet Take 1 tablet (5 mg total) by mouth every 6 (six) hours as needed for severe pain. 30 tablet 0   No current facility-administered medications for this visit.    No Known Allergies  Review of Systems negative except from HPI and PMH  Physical Exam BP 144/90 mmHg  Pulse 60  Ht 5\' 6"  (1.676 m)  Wt 213 lb (96.616 kg)  BMI 34.40 kg/m2 Well developed and well nourished in no acute distress  HENT normal E scleral and icterus clear Neck Supple JVP flat; carotids brisk and full Clear to ausculation Device pocket well healed; without hematoma or erythema.  There is no tethering Regular rate and rhythm, no murmurs gallops or rub Soft with active bowel sounds No clubbing cyanosis  Edema Alert and oriented, grossly normal motor and sensory function Skin Warm and Dry  ECG  Apacing 14/11/35 Low Volts RAD and Rwave V1  Assessment and  Plan  Aborted Cardiac arrest  ICD  St Jude   Hypertension  Sleep disiordered breathing  Sinus bradycardia/chronotropic incompetence  Low Voltage ECG  FHx  Cardiac Arrest  The patient had an aborted cardiac arrest without a identified cause. Potential clues are suggested by his electrocardiogram which demonstrates low voltage as well as RVH and right axis deviation. The latter is associated by echo with mild right atrial enlargement suggesting a possible ASD. Having had his defibrillator implanted, ability to look at myocardium is limited; will try to undertake a resting thallium scan  We have discussed the importance of trying to understand the mechanism of his cardiac arrest. The family history of death in his father previously ascribed to coronary disease now gives [place] further pause.  Will increase his lisinopril to try to address his elevated blood pressure.  We will decrease his amiodarone as an been no intercurrent arrhythmias. We will check surveillance laboratories today.  We will activate rate response on his device

## 2014-07-06 ENCOUNTER — Other Ambulatory Visit: Payer: Self-pay

## 2014-07-06 ENCOUNTER — Other Ambulatory Visit
Admit: 2014-07-06 | Disposition: A | Discharge status: Home / Self Care | Payer: Self-pay | Attending: Internal Medicine | Admitting: Internal Medicine

## 2014-07-06 DIAGNOSIS — E875 Hyperkalemia: Secondary | ICD-10-CM

## 2014-07-06 DIAGNOSIS — I428 Other cardiomyopathies: Secondary | ICD-10-CM

## 2014-07-06 LAB — COMPREHENSIVE METABOLIC PANEL
ALBUMIN: 4.7 g/dL (ref 3.5–4.8)
ALK PHOS: 94 IU/L (ref 39–117)
ALT: 31 IU/L (ref 0–44)
AST: 34 IU/L (ref 0–40)
Albumin/Globulin Ratio: 2.1 (ref 1.1–2.5)
BILIRUBIN TOTAL: 2.9 mg/dL — AB (ref 0.0–1.2)
BUN/Creatinine Ratio: 14 (ref 10–22)
BUN: 15 mg/dL (ref 8–27)
CALCIUM: 9.9 mg/dL (ref 8.6–10.2)
CO2: 24 mmol/L (ref 18–29)
Chloride: 104 mmol/L (ref 97–108)
Creatinine, Ser: 1.09 mg/dL (ref 0.76–1.27)
GFR, EST AFRICAN AMERICAN: 79 mL/min/{1.73_m2} (ref >59)
GFR, EST NON AFRICAN AMERICAN: 68 mL/min/{1.73_m2} (ref >59)
Globulin, Total: 2.2 g/dL (ref 1.5–4.5)
Glucose: 95 mg/dL (ref 65–99)
Potassium: 5.8 mmol/L — ABNORMAL HIGH (ref 3.5–5.2)
Sodium: 146 mmol/L — ABNORMAL HIGH (ref 134–144)
TOTAL PROTEIN: 6.9 g/dL (ref 6.0–8.5)

## 2014-07-06 LAB — BASIC METABOLIC PANEL
Anion Gap: 5 — ABNORMAL LOW (ref 7–16)
BUN: 17 mg/dL
CO2: 30 mmol/L
CREATININE: 0.87 mg/dL
Calcium, Total: 9.1 mg/dL
Chloride: 106 mmol/L
EGFR (African American): >60
EGFR (Non-African Amer.): >60
Glucose: 99 mg/dL
Potassium: 4.2 mmol/L
SODIUM: 141 mmol/L

## 2014-07-06 LAB — TSH: TSH: 4.98 u[IU]/mL — AB (ref 0.450–4.500)

## 2014-07-12 ENCOUNTER — Ambulatory Visit
Admit: 2014-07-12 | Disposition: A | Discharge status: Home / Self Care | Payer: Self-pay | Attending: Internal Medicine | Admitting: Internal Medicine

## 2014-07-12 DIAGNOSIS — I340 Nonrheumatic mitral (valve) insufficiency: Secondary | ICD-10-CM | POA: Diagnosis not present

## 2014-07-17 NOTE — Consult Note (Signed)
General Aspect Primary cardiologist: New to [facility] __________________  69 year old male with no prior cardiac history [place] contusion and received CPR via bystander until EMS arrival. Upon their arrival he was found to be in V-fib, received shock x 2, went into V-tach, followed by NSR and was brought to [facility] ED.  __________________  PMH: Unknown at this time secondary to emergent situation  __________________   Present Illness 69 year old male with the above problem list who [place] with a head contusion and received CPR via bystander until EMS arrival. Upon their arrival he was found to be in V-fib, received shock x 2, went into V-tach, followed by NSR and was brought to [facility] ED.   Patient was in his usual state of health this morning while he was at the mall then all of the sudden he became dizzy and could no longer see. This was followed by him passing out. He suffered a head contusion in this process. He received CPR from a bystander. Upon EMS arrival he was found to be in V-fib, received shock x 2, went into V-tach, followed by NSR and was brought to [facility] ED. EKG shows irregular rhythm, 101, RBBB, nonspecific st/t changes in lateral leads.   Upon his arrival to [facility] ED he received amiodarone bolus 150 mg and was placed on amiodarone gtt. He also received heparin bolus. Initial troponin 0.04. CK-MB 3.1. He was sent for head CT, state read as negative for bleed. While in CT he went back into v-tach. He was brought back into ED 16. He was confused and lethargic. The decision was made to avoid sedation given that. Given his recent cardiac arrest and urgent need for cardiac catheterization the choice was made for immediate synchronized cardioversion at 100 J. He promptly went into NSR with a rate in the 60s-70s. His mentation improved. Unfortunately, his vision remained poor. He was rebolused with 150 mg of amiodarone. He was taken to  the cardiac cath lab. He remained in contact with a provider.   Physical Exam:  GEN obese, critically ill appearing   HEENT hearing intact to voice   NECK supple   RESP normal resp effort  clear BS   CARD Irregular rate and rhythm  No murmur   ABD denies tenderness  soft   EXTR negative edema   SKIN normal to palpation   NEURO lethargic   PSYCH lethargic   Review of Systems:  ROS Pt not able to provide ROS   Medications/Allergies Reviewed Medications/Allergies reviewed   Family & Social History:  Family and Social History:  Family History unable to provide 2/2 emergent situation and lethargy   Social History as above   Place of Living Home  live alone     diabetes:    high cholesterol:    chicken pox:    Back problems [diffuse idiopathic skeletal hyperostosis]:    Hx Kidney Stones:    Asthma:    Hypertension:    tonsillectomy and adenoidectomy:    left total knee revision: 13-Jul-2009   Left Total Knee Replacement: Sep 2010   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 + D 1 tab(s) PO 2 times a day  Active  Fish Oil 1000 mg 1 cap(s) PO 2 times a day  Active  aspirin 42m 1   once a day  Active  metformin 500 mg oral tablet, extended release 1  orally 2 times a day  Active  stool softener 100 3   once a day (at bedtime)  Active  hydrochlorothiazide-lisinopril 12.5 mg-10 mg oral tablet 1  orally once a day AM Active  Celebrex 200 mg oral capsule 1 cap(s) orally once a day x 30 days as needed  Active   oxycodone 5 mg oral tablet 1- 2 tabs  orally every 4 hours as needed  for pain Active   Lab Results:  Hepatic:  13-Jan-16 11:14   Bilirubin, Total  2.2  Alkaline Phosphatase 100 (46-116 NOTE: New Reference Range 10/05/13)  SGPT (ALT)  165 (14-63 NOTE: New Reference Range 10/05/13)  SGOT (AST)  199  Total Protein, Serum 6.5  Albumin, Serum 3.5  Routine Chem:  13-Jan-16 11:14   Glucose, Serum  191  BUN 18  Creatinine (comp) 1.15  Sodium, Serum 139  Potassium, Serum 3.5  Chloride, Serum 105  CO2, Serum 21  Calcium (Total), Serum  8.1  Osmolality (calc) 285  eGFR (African American) >60  eGFR (Non-African American) >60 (eGFR values <633mmin/1.73 m2 may be an indication of chronic kidney disease (CKD). Calculated eGFR, using the MRDR Study equation, is useful in  patients with stable renal function. The eGFR calculation will not be reliable in acutely ill patients when serum creatinine is changing rapidly. It is not useful in patients on dialysis. The eGFR calculation may not be applicable to patients at the low and high extremes of body sizes, pregnant women, and vegetarians.)  Anion Gap 13  Cardiac:  13-Jan-16 11:14   Troponin I 0.04 (0.00-0.05 0.05 ng/mL or less: NEGATIVE  Repeat testing in 3-6 hrs  if clinically indicated. >0.05 ng/mL: POTENTIAL  MYOCARDIAL INJURY. Repeat  testing in 3-6 hrs if  clinically indicated. NOTE: An increase or decrease  of 30% or more on serial  testing suggests a  clinically important change)  CK, Total 107  CPK-MB, Serum 3.1 (Result(s) reported on 30 Mar 2014 at 11:42AM.)  Routine Coag:  13-Jan-16 11:14   Prothrombin 13.8  INR 1.1 (INR reference interval applies to patients on anticoagulant therapy. A single INR therapeutic range for coumarins is not optimal for all indications; however, the suggested range for most indications is 2.0 - 3.0. Exceptions to the INR Reference Range may include: Prosthetic heart valves, acute  myocardial infarction, prevention of myocardial infarction, and combinations of aspirin and anticoagulant. The need for a higher or lower target INR must be assessed individually. Reference: The Pharmacology and Management of the Vitamin K  antagonists: the seventh ACCP Conference on Antithrombotic and Thrombolytic Therapy. ChWCBJS.2831ept:126 (3suppl): [id number] HCT value >55% may artifactually increase the PT.  In one study,  the increase was an average of 25%. Reference:  "Effect on Routine and Special Coagulation Testing Values of Citrate Anticoagulant Adjustment in Patients with High HCT Values." American Journal of Clinical Pathology 2006;126:400-405.)  Activated PTT (APTT) 33.9 (A HCT value >55% may artifactually increase the APTT. In one study, the increase was  an average of 19%. Reference: "Effect on Routine and Special Coagulation Testing Values of Citrate Anticoagulant Adjustment in Patients with High HCT Values." American Journal of Clinical Pathology 2006;126:400-405.)  Routine Hem:  13-Jan-16 11:14   WBC (CBC) 10.5  RBC (CBC) 4.80  Hemoglobin (CBC) 14.8  Hematocrit (CBC) 44.8  Platelet Count (CBC) 225 (Result(s) reported on 30 Mar 2014 at 11:27AM.)  MCV 93  MCH 30.8  MCHC 33.0  RDW 13.5   EKG:  EKG Interp. by me   Interpretation irregular rhythm possible  a-fib, 101, RBBB, nonspecific st/t changes in lateral leads   Radiology Results: XRay:    13-Jan-16 11:27, Chest Portable Single View  Chest Portable Single View   REASON FOR EXAM:    Chest Pain  COMMENTS:       PROCEDURE: DXR - DXR PORTABLE CHEST SINGLE VIEW  - Mar 30 2014 11:27AM     CLINICAL DATA:  Cardiac arrest.    EXAM:  PORTABLE CHEST - 1 VIEW    COMPARISON:  PA and lateral chest 11/28/2008.    FINDINGS:  Defibrillator pads are in place. Lung volumes are low with  asymmetric elevation of the right hemidiaphragm identified. Heart  size is mildly enlarged but there is no pulmonary edema.  No  pneumothorax or pleural effusion.     IMPRESSION:  No acute finding in a low volume chest.      Electronically Signed    By: Inge Rise M.D.    On: 03/30/2014 11:44         Verified By: Ramond Dial, M.D.,  CT:    13-Jan-16 11:46, CT Head Without Contrast  CT Head Without Contrast   REASON FOR EXAM:    loss of sight post CPR  COMMENTS:       PROCEDURE: CT  - CT HEAD WITHOUT CONTRAST  - Mar 30 2014 11:46AM     CLINICAL DATA:  Loss of vision after CPR today.    EXAM:  CT HEAD WITHOUT CONTRAST    TECHNIQUE:  Contiguous axial imageswere obtained from the base of the skull  through the vertex without intravenous contrast.    COMPARISON:  None.  FINDINGS:  No evidence of acute intracranial abnormality including hemorrhage,  infarct, mass lesion, mass effect, midline shift or abnormal  extra-axial fluid collection is identified. No hydrocephalus or  pneumocephalus. Small mucous retention cysts or polyps are seen the  maxillary sinuses.     IMPRESSION:  No acute abnormality.      Electronically Signed    By: Inge Rise M.D.    On: 03/30/2014 11:56     Verified By: Ramond Dial, M.D.,    Adhesive: Blisters   Impression 69 year old male with no prior cardiac history who [place] with a head contusion and received CPR via bystander until EMS arrival. Upon their arrival he was found to be in V-fib, received shock x 2, went into V-tach, followed by NSR and was brought to [facility] ED.   1. Cardiac arrest: -Received CPR via bystander until EMS arrival. Upon their arrival he was found to be in V-fib, received shock x 2, went into V-tach, followed by NSR and was brought to [facility] ED --Back into v-tach while in CT, given his head trauma sedation was held. Given his recent cardiac arrest, urgent need for cardiac cath, and declining response in mental status the decision to proceed with synchronized cardioversion at 100 J rather  than  rebolus of amiodarone was made. He converted to NSR at rate of 70s with 100J with synchronized cardioversion and remained there. -He was rebolused with 150 mg IV amiodarone -He was transported to cardiac cath lab - provider remained with patient -Heparin gtt started in cath lab (head CT negative for bleed) -Cardiac cath  2. Encephalopathy: -Head CT negative -May need MRI  -Consider ETOH, UDS -No obvious signs of infection at this time  3. HTN: -Publishing rights manager for Addendum Section:  Leonie Man (MD) (Signed Addendum 13-Jan-16 18:48)  I saw the patient in the ER along with Mr. Idolina Primer.  Witnessed cardiac arrest - VT s/p CPR / Defibrillation - again in ER.   Rebolused Amiodarone. Unknown Cardiac history - no DM or HLD but + HTN with new unexplained VT arrest.  Need to exclude Ischemic etiology  Emergent Cardiac Cath today - PCI if indicated, if not, will arrange transfer to [facility] for EP evaluation. Continue Amiodarone infusion for now - rebolused in ER.  --> monitor for bradycardia.  More plan following cardiac cath.   Briefly discussed procedure with R/B/A/I with the patient - he remains confused, but well awake.  Not able to fully comprehend everything, but agrees to proceed.  [place]   Electronic Signatures: Rise Mu (PA-C)  (Signed 13-Jan-16 12:28)  Authored: General Aspect/Present Illness, History and Physical Exam, Review of System, Family & Social History, Past Medical History, Home Medications, Labs, EKG , Radiology, Allergies, Impression/Plan Leonie Man (MD)  (Signed 13-Jan-16 18:48)  Co-Signer: General Aspect/Present Illness, History and Physical Exam, Review of System, Family & Social History, Past Medical History, Home Medications, Labs, EKG , Radiology, Allergies, Impression/Plan   Last Updated: 13-Jan-16 18:48 by Leonie Man (MD)

## 2014-07-21 ENCOUNTER — Other Ambulatory Visit: Payer: Self-pay

## 2014-07-21 DIAGNOSIS — I428 Other cardiomyopathies: Secondary | ICD-10-CM

## 2014-07-27 ENCOUNTER — Ambulatory Visit
Admission: RE | Admit: 2014-07-27 | Discharge: 2014-07-27 | Disposition: A | Discharge status: Home / Self Care | Payer: Medicare Other | Source: Ambulatory Visit | Attending: Internal Medicine | Admitting: Internal Medicine

## 2014-07-27 ENCOUNTER — Ambulatory Visit: Payer: Medicare Other

## 2014-07-27 ENCOUNTER — Encounter: Payer: Medicare Other | Attending: Internal Medicine

## 2014-07-27 ENCOUNTER — Ambulatory Visit (INDEPENDENT_AMBULATORY_CARE_PROVIDER_SITE_OTHER): Payer: Medicare Other | Admitting: Cardiovascular Disease

## 2014-07-27 ENCOUNTER — Other Ambulatory Visit: Payer: Self-pay | Admitting: *Deleted

## 2014-07-27 ENCOUNTER — Encounter: Payer: Self-pay | Admitting: Cardiovascular Disease

## 2014-07-27 ENCOUNTER — Ambulatory Visit (INDEPENDENT_AMBULATORY_CARE_PROVIDER_SITE_OTHER): Payer: Medicare Other | Admitting: Cardiology

## 2014-07-27 VITALS — BP 158/90 | HR 60 | Ht 66.0 in | Wt 214.0 lb

## 2014-07-27 DIAGNOSIS — I4581 Long QT syndrome: Secondary | ICD-10-CM

## 2014-07-27 DIAGNOSIS — Z95810 Presence of automatic (implantable) cardiac defibrillator: Secondary | ICD-10-CM

## 2014-07-27 DIAGNOSIS — I428 Other cardiomyopathies: Secondary | ICD-10-CM

## 2014-07-27 DIAGNOSIS — M7989 Other specified soft tissue disorders: Secondary | ICD-10-CM | POA: Diagnosis not present

## 2014-07-27 DIAGNOSIS — I495 Sick sinus syndrome: Secondary | ICD-10-CM | POA: Diagnosis not present

## 2014-07-27 DIAGNOSIS — I429 Cardiomyopathy, unspecified: Secondary | ICD-10-CM | POA: Insufficient documentation

## 2014-07-27 DIAGNOSIS — I10 Essential (primary) hypertension: Secondary | ICD-10-CM | POA: Diagnosis not present

## 2014-07-27 MED ORDER — FUROSEMIDE 20 MG PO TABS: 20.0000 mg | ORAL_TABLET | Freq: Every day | ORAL | Status: DC | PRN

## 2014-07-27 NOTE — Patient Instructions (Signed)
You are doing well.  If swelling gets severe, Take lasix as needed Leg elevation, TED/compression hose  Please call us if you have new issues that need to be addressed before your next appt.  Your physician wants you to follow-up in: 6 months with Dr. Melody Haver will receive a reminder letter in the mail two months in advance. If you don't receive a letter, please call our office to schedule the follow-up appointment.

## 2014-07-27 NOTE — Progress Notes (Signed)
Patient ID: Ethan Maule., male    DOB: 02/27/1946, 69 y.o.   MRN: [id number]  HPI Comments: 69 year old male with history of VF arrest in January 2016 , resuscitated via bystander CPR and subsequent defibrillation, catheterization revealing normal coronary arteries at [facility] and was subsequent transferred to [facility] for further evaluation.  Initial LV dysfunction by left ventriculography during catheterization, follow-up echocardiogram a day later showed normal LV function with an EF 50-55%. AICD successfully placed.  Previously seen by Dr. Caryl Comes with increase of lisinopril for hypertension. He was scheduled to see Dr. Ellyn Hack today but he was unavailable and called to the cardiac catheterization lab for urgent case. He was added to my schedule. He reports having worsening lower extremity edema over the past week or 2.  He has been very active, on his feet, cleaning out old papers, trying to walk more. Previously seen by Dr. dew one year ago for chronic venous insufficiency. Reports they did a "procedure"to his legs.   Denies drinking excessively, blood pressure has been stable at home, 120 up to 130s. He bought a new blood pressure cuff. He is surprised his blood pressure is elevated on today's visit.  He has completed his thallium scan to look at viability but we were not aware this was complete and it has not been read yet  EKG on today's visit shows normal sinus rhythm with rate 60 bpm, low voltage    No Known Allergies  Current Outpatient Prescriptions on File Prior to Visit  Medication Sig Dispense Refill  . amiodarone (PACERONE) 200 MG tablet Take 1/2 tablet (100 mg) by mouth once daily    . atorvastatin (LIPITOR) 40 MG tablet Take 1 tablet (40 mg total) by mouth daily at 6 PM. 30 tablet 5  . carvedilol (COREG) 6.25 MG tablet Take 1 tablet (6.25 mg total) by mouth 2 (two) times daily. 60 tablet 3  . lisinopril (PRINIVIL,ZESTRIL) 20 MG tablet Take 1 tablet (20 mg total) by  mouth 2 (two) times daily.     No current facility-administered medications on file prior to visit.    Past Medical History  Diagnosis Date  . HTN (hypertension)   . HLD (hyperlipidemia)   . Asthma   . Coronary artery disease, non-occlusive     a. cath 03/2014 at [facility]: no sig CAD (OM1 30%, CFX 30%), EF 35%  . Cardiac arrest     a. 03/30/2014 VF arrest in setting of NICM >> CPR/defib in community >> s/p dual chamber AICD  . DM2 (diabetes mellitus, type 2)     a. Diet controlled.  . Arthritis     a. L knee.  . Kidney stones   . AICD (automatic cardioverter/defibrillator) present 03/31/14    a. 03/2014 s/p SJM 2411-36C dual chamber AICD (serial Number [id number])- followed by Dr. Caryl Comes  . NICM (nonischemic cardiomyopathy)     a. 03/2014 Echo:  Inferolateral and lateral HK, EF 05-39%, grade 1 diastolic dysfunction, mild MR, mild LAE, mild RAE, trivial TR, no effusion.    Past Surgical History  Procedure Laterality Date  . Tonsillectomy      "as a kid"  . Total knee arthroplasty Left ~ 2010  . Total knee arthroplasty with revision components Left ~ 2011  . Joint replacement    . Cystoscopy w/ litholapaxy / ehl    . Cardioversion  03/30/2014  . Varicose vein surgery Left ~ 2014  . Cardiac catheterization  03/30/2014  . Implantable cardioverter defibrillator implant  N/A 03/31/2014    Procedure: IMPLANTABLE CARDIOVERTER DEFIBRILLATOR IMPLANT;  Surgeon: Evans Lance, MD;  Location: [facility] CATH LAB;  Service: Cardiovascular;  Laterality: N/A;    Social History  reports that he has been passively smoking.  He has never used smokeless tobacco. He reports that he does not drink alcohol or use illicit drugs.  Family History family history includes Esophageal cancer in his mother; Heart attack in his father; Hypertension in his brother.  Review of Systems  Constitutional: Negative.   Respiratory: Negative.   Cardiovascular: Negative.   Gastrointestinal: Negative.   Musculoskeletal: Negative.    Skin: Negative.   Neurological: Negative.   Hematological: Negative.   Psychiatric/Behavioral: Negative.   All other systems reviewed and are negative.   BP 158/90 mmHg  Pulse 60  Ht 5\' 6"  (1.676 m)  Wt 214 lb (97.07 kg)  BMI 34.56 kg/m2 Blood pressure remained elevated even on my recheck Physical Exam  Constitutional: He is oriented to person, place, and time. He appears well-developed and well-nourished.  HENT:  Head: Normocephalic.  Nose: Nose normal.  Mouth/Throat: Oropharynx is clear and moist.  Eyes: Conjunctivae are normal. Pupils are equal, round, and reactive to light.  Neck: Normal range of motion. Neck supple. No JVD present.  Cardiovascular: Normal rate, regular rhythm, S1 normal, S2 normal, normal heart sounds and intact distal pulses.  Exam reveals no gallop and no friction rub.   No murmur heard. Nonpitting edema consistent with venous insufficiency  Pulmonary/Chest: Effort normal and breath sounds normal. No respiratory distress. He has no wheezes. He has no rales. He exhibits no tenderness.  Abdominal: Soft. Bowel sounds are normal. He exhibits no distension. There is no tenderness.  Musculoskeletal: Normal range of motion. He exhibits edema. He exhibits no tenderness.  Lymphadenopathy:    He has no cervical adenopathy.  Neurological: He is alert and oriented to person, place, and time. Coordination normal.  Skin: Skin is warm and dry. No rash noted. No erythema.  Psychiatric: He has a normal mood and affect. His behavior is normal. Judgment and thought content normal.      Assessment and Plan   Nursing note and vitals reviewed.

## 2014-07-27 NOTE — Assessment & Plan Note (Signed)
Followed by Dr. Caryl Comes. History of arrest

## 2014-07-27 NOTE — Assessment & Plan Note (Signed)
Leg edema likely noncardiac. Lasix provided to take sparingly. Will monitor blood pressure closely with him. Otherwise no medication changes at this time

## 2014-07-27 NOTE — Assessment & Plan Note (Signed)
He reports significantly improved blood pressure at home using new blood pressure cuff. We have recommended he bring this into the office so we can compare his numbers to our manual cuff

## 2014-07-27 NOTE — Assessment & Plan Note (Signed)
Leg swelling likely from chronic venous insufficiency. Recommended compression hose, leg elevation. He has been on his feet more the past week or so. He also volunteers that he has been evaluated in the past by Dr. dew for a vein problem Lasix was provided for him to take dairy sparingly if symptoms get worse. Recommended he take this with potassium

## 2014-08-18 ENCOUNTER — Other Ambulatory Visit: Payer: Self-pay | Admitting: Ophthalmology

## 2014-08-18 DIAGNOSIS — H4710 Unspecified papilledema: Secondary | ICD-10-CM

## 2014-08-19 ENCOUNTER — Telehealth: Payer: Self-pay | Admitting: *Deleted

## 2014-08-19 NOTE — Telephone Encounter (Signed)
Pt went to an eye specialist. And now he is told he needs an MRI  Not sure if he was told by Dr Caryl Comes, but he recalls that he was told he is not allowed to do those any more for pt has a pacemaker. Please adviise

## 2014-08-19 NOTE — Telephone Encounter (Signed)
Patient wants to know if he can take [place] with all the other medications he is on for his heart???

## 2014-08-19 NOTE — Telephone Encounter (Signed)
Spoke w/ pt.  Advised him of Ryan's recommendation.  She verbalizes understanding and states that he needs an answer regarding MRI before 08/26/14. Please advise.  Thank you.

## 2014-08-19 NOTE — Telephone Encounter (Signed)
Please see previous note.

## 2014-08-19 NOTE — Telephone Encounter (Signed)
1. Generally MRI may be contraindicated with PPM. WIth some of the newer models they may be able to be done. Some institutions actually have cardiologists sit in on patients that have devices placed while they are undergoing MRIs to make sure things go ok. I would defer this question to Dr. Caryl Comes as he could answer yes or no for him definitively.   2. There are no interactions with amoxicillin with the current medications on his list.

## 2014-08-19 NOTE — Telephone Encounter (Signed)
Ethan Vega at 08/19/2014 11:02 AM             Patient wants to know if he can take [place] with all the other medications he is on for his heart???

## 2014-08-22 ENCOUNTER — Other Ambulatory Visit: Payer: Self-pay | Admitting: Nurse Practitioner

## 2014-08-22 NOTE — Telephone Encounter (Signed)
Most institutions will not do an MRI with a ICD in place.  Some amy and it will be up to them,  If it is a matter oflife or death ( in terms of diagnosis), we should have the orderign MD call me

## 2014-08-23 ENCOUNTER — Other Ambulatory Visit: Payer: Self-pay | Admitting: Ophthalmology

## 2014-08-23 NOTE — Telephone Encounter (Signed)
Pt called back, states his MRI was cancelled, and he was scheduled for a CT scan instead. Pt would like to know if a CT scan is ok to have.

## 2014-08-23 NOTE — Telephone Encounter (Signed)
The patient is aware that CT scanning is ok.

## 2014-08-23 NOTE — Telephone Encounter (Signed)
Confirmed with Juanda Crumble in the device clinic that the patient does not have a MRI compatible device.  I spoke with the patient and made him aware of Dr. Olin Pia recommendations regarding the MRI and the need for the ordering MD to call him if it is critical that an MRI be done. Per the patient, the ordering doctor is Dr. Charlann Boxer at [facility].  I advised him to call [facility] and give them our office # to contact, we can then give them the appropriate contact # for Dr. Caryl Comes is he is not still in the office.  He verbalizes understanding.

## 2014-08-25 ENCOUNTER — Other Ambulatory Visit: Payer: Self-pay | Admitting: Ophthalmology

## 2014-08-25 DIAGNOSIS — H4710 Unspecified papilledema: Secondary | ICD-10-CM

## 2014-08-26 ENCOUNTER — Ambulatory Visit: Payer: Medicare Other

## 2014-08-29 ENCOUNTER — Ambulatory Visit
Admission: RE | Admit: 2014-08-29 | Discharge: 2014-08-29 | Disposition: A | Discharge status: Home / Self Care | Payer: Medicare Other | Source: Ambulatory Visit | Attending: Ophthalmology | Admitting: Ophthalmology

## 2014-08-29 DIAGNOSIS — H4710 Unspecified papilledema: Secondary | ICD-10-CM

## 2014-08-29 HISTORY — DX: Malignant (primary) neoplasm, unspecified: C80.1

## 2014-08-29 MED ORDER — IOHEXOL 300 MG/ML  SOLN
75.0000 mL | Freq: Once | INTRAMUSCULAR | Status: AC | PRN
  Administered 2014-08-29: 75 mL via INTRAVENOUS

## 2014-08-31 ENCOUNTER — Other Ambulatory Visit
Admission: RE | Admit: 2014-08-31 | Discharge: 2014-08-31 | Disposition: A | Discharge status: Home / Self Care | Payer: Medicare Other | Source: Ambulatory Visit | Attending: Ophthalmology | Admitting: Ophthalmology

## 2014-08-31 DIAGNOSIS — H4710 Unspecified papilledema: Secondary | ICD-10-CM | POA: Insufficient documentation

## 2014-08-31 LAB — C-REACTIVE PROTEIN: CRP: <0.5 mg/dL (ref <1.0)

## 2014-08-31 LAB — CBC WITH DIFFERENTIAL/PLATELET
Basophils Absolute: 0.1 10*3/uL (ref 0–0.1)
Basophils Relative: 2 %
EOS ABS: 0.4 10*3/uL (ref 0–0.7)
Eosinophils Relative: 8 %
HCT: 41.7 % (ref 40.0–52.0)
Hemoglobin: 13.7 g/dL (ref 13.0–18.0)
LYMPHS PCT: 21 %
Lymphs Abs: 1 10*3/uL (ref 1.0–3.6)
MCH: 29.9 pg (ref 26.0–34.0)
MCHC: 32.8 g/dL (ref 32.0–36.0)
MCV: 91.2 fL (ref 80.0–100.0)
MONOS PCT: 9 %
Monocytes Absolute: 0.4 10*3/uL (ref 0.2–1.0)
NEUTROS ABS: 2.8 10*3/uL (ref 1.4–6.5)
NEUTROS PCT: 60 %
PLATELETS: 199 10*3/uL (ref 150–440)
RBC: 4.57 MIL/uL (ref 4.40–5.90)
RDW: 13.8 % (ref 11.5–14.5)
WBC: 4.6 10*3/uL (ref 3.8–10.6)

## 2014-08-31 LAB — SEDIMENTATION RATE: Sed Rate: 5 mm/hr (ref 0–20)

## 2014-09-22 ENCOUNTER — Encounter: Payer: Self-pay | Admitting: Internal Medicine

## 2014-10-04 ENCOUNTER — Encounter: Payer: Self-pay | Admitting: Internal Medicine

## 2014-10-04 ENCOUNTER — Ambulatory Visit (INDEPENDENT_AMBULATORY_CARE_PROVIDER_SITE_OTHER): Payer: Medicare Other | Admitting: Internal Medicine

## 2014-10-04 ENCOUNTER — Telehealth: Payer: Self-pay | Admitting: Internal Medicine

## 2014-10-04 ENCOUNTER — Other Ambulatory Visit: Payer: Self-pay | Admitting: Internal Medicine

## 2014-10-04 ENCOUNTER — Other Ambulatory Visit
Admission: RE | Admit: 2014-10-04 | Discharge: 2014-10-04 | Disposition: A | Discharge status: Home / Self Care | Payer: Medicare Other | Source: Ambulatory Visit | Attending: Internal Medicine | Admitting: Internal Medicine

## 2014-10-04 VITALS — BP 133/82 | HR 60 | Ht 66.0 in | Wt 210.4 lb

## 2014-10-04 DIAGNOSIS — Z95810 Presence of automatic (implantable) cardiac defibrillator: Secondary | ICD-10-CM | POA: Diagnosis not present

## 2014-10-04 DIAGNOSIS — Z79899 Other long term (current) drug therapy: Secondary | ICD-10-CM | POA: Insufficient documentation

## 2014-10-04 DIAGNOSIS — I4581 Long QT syndrome: Secondary | ICD-10-CM

## 2014-10-04 DIAGNOSIS — R9439 Abnormal result of other cardiovascular function study: Secondary | ICD-10-CM

## 2014-10-04 DIAGNOSIS — I429 Cardiomyopathy, unspecified: Secondary | ICD-10-CM

## 2014-10-04 DIAGNOSIS — Z5181 Encounter for therapeutic drug level monitoring: Secondary | ICD-10-CM | POA: Insufficient documentation

## 2014-10-04 DIAGNOSIS — R931 Abnormal findings on diagnostic imaging of heart and coronary circulation: Secondary | ICD-10-CM

## 2014-10-04 DIAGNOSIS — I495 Sick sinus syndrome: Secondary | ICD-10-CM | POA: Diagnosis not present

## 2014-10-04 DIAGNOSIS — R748 Abnormal levels of other serum enzymes: Secondary | ICD-10-CM | POA: Diagnosis present

## 2014-10-04 LAB — CUP PACEART INCLINIC DEVICE CHECK
Date Time Interrogation Session: 20160719114455
HighPow Impedance: 65.25 Ohm
Lead Channel Impedance Value: 400 Ohm
Lead Channel Impedance Value: 425 Ohm
Lead Channel Pacing Threshold Amplitude: 0.75 V
Lead Channel Pacing Threshold Amplitude: 1 V
Lead Channel Pacing Threshold Amplitude: 1 V
Lead Channel Pacing Threshold Pulse Width: 0.5 ms
Lead Channel Pacing Threshold Pulse Width: 0.5 ms
Lead Channel Sensing Intrinsic Amplitude: 11.7 mV
Lead Channel Sensing Intrinsic Amplitude: 2.8 mV
Lead Channel Setting Pacing Pulse Width: 0.5 ms
Lead Channel Setting Sensing Sensitivity: 0.5 mV
MDC IDC MSMT BATTERY REMAINING LONGEVITY: 84 mo
MDC IDC MSMT LEADCHNL RV PACING THRESHOLD PULSEWIDTH: 0.5 ms
MDC IDC SET LEADCHNL RA PACING AMPLITUDE: 1.75 V
MDC IDC SET LEADCHNL RV PACING AMPLITUDE: 2.5 V
MDC IDC SET ZONE DETECTION INTERVAL: 350 ms
MDC IDC STAT BRADY RA PERCENT PACED: 99.31 %
MDC IDC STAT BRADY RV PERCENT PACED: 1.7 %
Pulse Gen Serial Number: 7241695
Zone Setting Detection Interval: 250 ms
Zone Setting Detection Interval: 300 ms

## 2014-10-04 LAB — TSH: TSH: 5.163 u[IU]/mL — AB (ref 0.350–4.500)

## 2014-10-04 MED ORDER — CLOPIDOGREL BISULFATE 75 MG PO TABS: 75.0000 mg | ORAL_TABLET | Freq: Every day | ORAL | Status: DC

## 2014-10-04 NOTE — Patient Instructions (Addendum)
Medication Instructions: 1) STOP aleve/ aspirin 2) START plavix 75 mg once daily  Labwork: - Your physician recommends that you have lab work today: TSH  Procedures/Testing: - Non-Cardiac CT scanning, (CAT scanning), is a noninvasive, special x-ray that produces cross-sectional images of the body using x-rays and a computer. CT scans help physicians diagnose and treat medical conditions. For some CT exams, a contrast material is used to enhance visibility in the area of the body being studied. CT scans provide greater clarity and reveal more details than regular x-ray exams.- High resolution CT scan of the chest  Follow-Up: - Remote monitoring is used to monitor your Pacemaker of ICD from home. This monitoring reduces the number of office visits required to check your device to one time per year. It allows [place] to keep an eye on the functioning of your device to ensure it is working properly. You are scheduled for a device check from home on 01/03/15. You may send your transmission at any time that day. If you have a wireless device, the transmission will be sent automatically. After your physician reviews your transmission, you will receive a postcard with your next transmission date.  - Your physician wants you to follow-up in: 6 months with Dr. Caryl Comes. You will receive a reminder letter in the mail two months in advance. If you don't receive a letter, please call our office to schedule the follow-up appointment.  Any Additional Special Instructions Will Be Listed Below (If Applicable). - none

## 2014-10-04 NOTE — Progress Notes (Signed)
Patient Care Team: Jodi Marble, MD as PCP - General (Internal Medicine)   HPI  Ethan Vega. is a 69 y.o. male Seen following ICD implantation 1/16 following aborted cardiac arrest. Cardiac evaluation at that time included an echocardiogram demonstrating normal left ventricular function had recovered from the EF of 30-35% range. Catheterization 1/16 demonstrated no significant coronary disease;   We undertook thallium for perfusion defects looking for infiltrative processes   Flecainide challenge was negative ECG  Low Volts R-V1 with RAD with mild RA enlargement SAECG done but results not in EPIC  He was discharged with amiodarone beta blockers and Ace inhibitors; surveillance laboratories 4/16 demonstrated normal ALT and a TSH that had gone from 0.9--4.9.  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 HTN (hypertension)   . HLD (hyperlipidemia)   . Asthma   . Coronary artery disease, non-occlusive     a. cath 03/2014 at [facility]: no sig CAD (OM1 30%, CFX 30%), EF 35%  . Cardiac arrest     a. 03/30/2014 VF arrest in setting of NICM >> CPR/defib in community >> s/p dual chamber AICD  . DM2 (diabetes mellitus, type 2)     a. Diet controlled.  . Arthritis     a. L knee.  . Kidney stones   . AICD (automatic cardioverter/defibrillator) present 03/31/14    a. 03/2014 s/p SJM 2411-36C dual chamber AICD (serial Number [id number])- followed by Dr. Caryl Comes  . NICM (nonischemic cardiomyopathy)     a. 03/2014 Echo:  Inferolateral and lateral HK, EF 82-95%, grade 1 diastolic dysfunction, mild MR, mild LAE, mild RAE, trivial TR, no effusion.  . Presence of combination internal cardiac defibrillator (ICD) and pacemaker 03/30/2014  . Cancer     Melanoma resected from scalp    Past Surgical History  Procedure Laterality Date  .  Tonsillectomy      "as a kid"  . Total knee arthroplasty Left ~ 2010  . Total knee arthroplasty with revision components Left ~ 2011  . Joint replacement    . Cystoscopy w/ litholapaxy / ehl    . Cardioversion  03/30/2014  . Varicose vein surgery Left ~ 2014  . Cardiac catheterization  03/30/2014  . Implantable cardioverter defibrillator implant N/A 03/31/2014    Procedure: IMPLANTABLE CARDIOVERTER DEFIBRILLATOR IMPLANT;  Surgeon: Evans Lance, MD;  Location: [facility] CATH LAB;  Service: Cardiovascular;  Laterality: N/A;    Current Outpatient Prescriptions  Medication Sig Dispense Refill  . amiodarone (PACERONE) 200 MG tablet Take 1/2 tablet (100 mg) by mouth once daily    . atorvastatin (LIPITOR) 20 MG tablet Take 20 mg by mouth daily.    . carvedilol (COREG) 6.25 MG tablet TAKE ONE TABLET BY MOUTH TWICE DAILY 60 tablet 6  . lisinopril (PRINIVIL,ZESTRIL) 20 MG tablet Take 1 tablet (20 mg total) by mouth 2 (two) times daily.    . traMADol-acetaminophen (ULTRACET) 37.5-325 MG per tablet 1 tablet every 4 (four) hours as needed.      No current facility-administered medications for this visit.    No Known Allergies  Review of Systems negative except from HPI and PMH  Physical Exam BP 133/82 mmHg  Pulse 60  Ht 5\' 6"  (1.676 m)  Wt 210 lb 6.4 oz (95.437 kg)  BMI 33.98 kg/m2 Well developed  and well nourished in no acute distress HENT normal E scleral and icterus clear Neck Supple JVP flat; carotids brisk and full Clear to ausculation Device pocket well healed; without hematoma or erythema.  There is no tethering Regular rate and rhythm, no murmurs gallops or rub Soft with active bowel sounds No clubbing cyanosis  Edema Alert and oriented, grossly normal motor and sensory function Skin Warm and Dry     Assessment and  Plan  Aborted Cardiac arrest  ICD  St Jude   Hypertension  Sleep disordered breathing  Sinus bradycardia/chronotropic incompetence  Low Voltage ECG  FHx  Cardiac Arrest   Retinal artery occlusion   Blood pressure is reasonably controlled  Thallium scan raises the possibility of sarcoid. This does not address the issues related to family history unfortunately. The echocardiogram was unrevealing inherited structural issues i.e. cardiomyopathy, non-compaction, HCM    We will undertake a CT scan-high resolution of his lungs. He has been seeing an eye doctor because of the retinal artery occlusion (see below) and will ask him to follow up as to whether there is any evidence of sarcoid or whether it could've been excluded by the examinations  With his retinal artery occlusion, notably he does not have atrial fibrillation detected on his device. Reviewing recommendations as relates to large vessel stroke disease, recommendations were for Plavix and/or aspirin-dipyridamole as opposed to aspirin alone and certainly not Aleve. I've discussed this with him and we'll begin him on Plavix 75 mg a day.  He anticipates going to [place]; we discussed interactions between roller coasters and devices, specifically breaks-magnetic and acceleration. He will avoid them  We will check a TSH given the interval change from [facility]

## 2014-10-04 NOTE — Telephone Encounter (Signed)
New message     Pt would like Dr. Caryl Comes to call Dr. Dawna Part @ [phone] regarding the problem with pt eye/stroke behind the eye. Pt states that Dr Dawna Part is only in the office Monday- Wednesday. Pt cannot make out the name of the condition that Dr. Caryl Comes wanted him to ask this other doctor about.   Please call to discuss.

## 2014-10-06 ENCOUNTER — Other Ambulatory Visit
Admission: RE | Admit: 2014-10-06 | Discharge: 2014-10-06 | Disposition: A | Discharge status: Home / Self Care | Payer: Medicare Other | Source: Ambulatory Visit | Attending: Internal Medicine | Admitting: Internal Medicine

## 2014-10-06 ENCOUNTER — Other Ambulatory Visit: Payer: Self-pay

## 2014-10-06 ENCOUNTER — Telehealth: Payer: Self-pay

## 2014-10-06 DIAGNOSIS — E038 Other specified hypothyroidism: Secondary | ICD-10-CM | POA: Diagnosis present

## 2014-10-06 NOTE — Telephone Encounter (Signed)
Left detailed message on pt cell phone VM with CB number for any questions.

## 2014-10-06 NOTE — Telephone Encounter (Signed)
Pt came in today, states he is having a CT scan on 7/27. Pt would like to know if there is any prep before this test.

## 2014-10-06 NOTE — Telephone Encounter (Signed)
Informed patient that I would call this physician on 7/26 when return to office.  Pt is agreeable to this and praised Dr. Caryl Comes!

## 2014-10-07 ENCOUNTER — Telehealth: Payer: Self-pay | Admitting: *Deleted

## 2014-10-07 LAB — T4: T4, Total: 5.3 ug/dL (ref 4.5–12.0)

## 2014-10-07 LAB — T3: T3 TOTAL: 91 ng/dL (ref 71–180)

## 2014-10-07 NOTE — Telephone Encounter (Signed)
Amy from scheduling calling for pt had a CT coming up and they need the orders to be signed  Please call amy if we have any questions.

## 2014-10-12 ENCOUNTER — Ambulatory Visit
Admission: RE | Admit: 2014-10-12 | Discharge: 2014-10-12 | Disposition: A | Discharge status: Home / Self Care | Payer: Medicare Other | Source: Ambulatory Visit | Attending: Internal Medicine | Admitting: Internal Medicine

## 2014-10-12 DIAGNOSIS — I2510 Atherosclerotic heart disease of native coronary artery without angina pectoris: Secondary | ICD-10-CM | POA: Insufficient documentation

## 2014-10-12 DIAGNOSIS — R931 Abnormal findings on diagnostic imaging of heart and coronary circulation: Secondary | ICD-10-CM | POA: Diagnosis present

## 2014-10-12 DIAGNOSIS — R918 Other nonspecific abnormal finding of lung field: Secondary | ICD-10-CM | POA: Diagnosis not present

## 2014-10-12 DIAGNOSIS — R9439 Abnormal result of other cardiovascular function study: Secondary | ICD-10-CM

## 2014-10-12 NOTE — Telephone Encounter (Signed)
Dr. Dawna Part returns my call and explains pt does not have sarcoid. I will relay information to Dr. Caryl Comes.

## 2014-10-12 NOTE — Telephone Encounter (Signed)
Left detailed message with Judeen Hammans at Dr. Olegario Shearer office about sarcoid question.

## 2014-10-13 NOTE — Progress Notes (Signed)
Spoke with Dr. Dawna Part at [facility] who states pt does not have sarcoid from an eye point of view.

## 2014-10-17 ENCOUNTER — Encounter: Payer: Self-pay | Admitting: Internal Medicine

## 2014-10-24 ENCOUNTER — Telehealth: Payer: Self-pay | Admitting: *Deleted

## 2014-10-24 NOTE — Telephone Encounter (Signed)
Forward to [place] results

## 2014-10-24 NOTE — Telephone Encounter (Signed)
Pt called asking for results on test he did on the 27 th.  It was his CT of the chest.  Please call pt is worried about results.

## 2014-10-27 ENCOUNTER — Other Ambulatory Visit: Payer: Self-pay | Admitting: Physician Assistant

## 2014-11-01 ENCOUNTER — Ambulatory Visit: Payer: Medicare Other | Admitting: Internal Medicine

## 2014-11-09 NOTE — Telephone Encounter (Signed)
Forward again to Dr. Caryl Comes Pt awaiting results

## 2014-11-11 ENCOUNTER — Telehealth: Payer: Self-pay

## 2014-11-11 NOTE — Telephone Encounter (Signed)
Forward to Dr Klein 

## 2014-11-13 NOTE — Telephone Encounter (Addendum)
Please beg my apologies   CT showed no evidence of sarcoid   He has tiny nodules which if he is a smoker should be re CT in one year, but otherwise no followup is needed

## 2014-11-18 ENCOUNTER — Telehealth: Payer: Self-pay

## 2014-11-18 NOTE — Telephone Encounter (Signed)
Patient Demographics     Patient Name Sex DOB SSN Address Phone    Ethan Vega, Ethan Vega. Male December 19, 1945 [id number] [address] [place] [address] [phone] (Home)      Message  Received: 2 days ago    Deboraha Sprang, MD  Georgiana Shore, RN           There is a message in cyber space about this CT was neg for sarcoid He has tiny nodules that if he is a smoker requires repeat scanning in one year but otherwise ( low risk) nothing else needs to be done  TM ------  Guess:)       Previous Messages       S/w pt regarding results and recommendations. Pt denies being a smoker therefore, per Dr. Caryl Comes, nothing else needs to be done.  Pt verbalized understanding with no other questions.

## 2014-11-18 NOTE — Telephone Encounter (Signed)
Patient Demographics     Patient Name Sex DOB SSN Address Phone    Ethan Vega, Ethan Vega. Male 1945/05/14 [id number] [place] [address] [phone] (Home)      Message  Received: 2 days ago    Deboraha Sprang, MD  Georgiana Shore, RN           There is a message in cyber space about this CT was neg for sarcoid He has tiny nodules that if he is a smoker requires repeat scanning in one year but otherwise ( low risk) nothing else needs to be done  TM ------  Guess:)       Previous Messages       Reviewed results and recommendations with pt who verbalized understanding. Per pt., he is not a smoker. Pt had no further questions.

## 2014-12-19 ENCOUNTER — Other Ambulatory Visit: Payer: Self-pay | Admitting: Nurse Practitioner

## 2014-12-23 ENCOUNTER — Other Ambulatory Visit: Payer: Self-pay

## 2014-12-28 ENCOUNTER — Other Ambulatory Visit: Payer: Self-pay

## 2014-12-28 MED ORDER — ATORVASTATIN CALCIUM 20 MG PO TABS: 20.0000 mg | ORAL_TABLET | Freq: Every day | ORAL | Status: DC

## 2014-12-28 MED ORDER — AMIODARONE HCL 200 MG PO TABS: 100.0000 mg | ORAL_TABLET | Freq: Every day | ORAL | Status: DC

## 2014-12-28 MED ORDER — CARVEDILOL 6.25 MG PO TABS: 6.2500 mg | ORAL_TABLET | Freq: Two times a day (BID) | ORAL | Status: DC

## 2014-12-28 MED ORDER — CLOPIDOGREL BISULFATE 75 MG PO TABS: 75.0000 mg | ORAL_TABLET | Freq: Every day | ORAL | Status: DC

## 2014-12-28 MED ORDER — LISINOPRIL 20 MG PO TABS: 20.0000 mg | ORAL_TABLET | Freq: Two times a day (BID) | ORAL | Status: DC

## 2015-01-03 ENCOUNTER — Ambulatory Visit (INDEPENDENT_AMBULATORY_CARE_PROVIDER_SITE_OTHER): Payer: Medicare Other | Admitting: *Deleted

## 2015-01-03 ENCOUNTER — Encounter: Payer: Self-pay | Admitting: Cardiology

## 2015-01-03 DIAGNOSIS — I469 Cardiac arrest, cause unspecified: Secondary | ICD-10-CM

## 2015-01-03 DIAGNOSIS — I495 Sick sinus syndrome: Secondary | ICD-10-CM

## 2015-01-03 LAB — CUP PACEART REMOTE DEVICE CHECK
Brady Statistic AP VP Percent: 1.6 %
Brady Statistic AP VS Percent: 98 %
Brady Statistic AS VP Percent: 1 %
Brady Statistic RA Percent Paced: 98 %
Brady Statistic RV Percent Paced: 1.6 %
HighPow Impedance: 69 Ohm
HighPow Impedance: 69 Ohm
Implantable Lead Implant Date: 20160114
Implantable Lead Implant Date: 20160114
Implantable Lead Location: 753859
Implantable Lead Location: 753860
Implantable Lead Model: 7122
Lead Channel Impedance Value: 350 Ohm
Lead Channel Pacing Threshold Pulse Width: 0.5 ms
Lead Channel Sensing Intrinsic Amplitude: 11.7 mV
Lead Channel Sensing Intrinsic Amplitude: 2.1 mV
Lead Channel Setting Pacing Amplitude: 1.75 V
Lead Channel Setting Pacing Amplitude: 2.5 V
Lead Channel Setting Pacing Pulse Width: 0.5 ms
Lead Channel Setting Sensing Sensitivity: 0.5 mV
MDC IDC MSMT BATTERY REMAINING LONGEVITY: 78 mo
MDC IDC MSMT BATTERY REMAINING PERCENTAGE: 89 %
MDC IDC MSMT BATTERY VOLTAGE: 3.07 V
MDC IDC MSMT LEADCHNL RA IMPEDANCE VALUE: 400 Ohm
MDC IDC MSMT LEADCHNL RA PACING THRESHOLD AMPLITUDE: 0.75 V
MDC IDC PG SERIAL: 7241695
MDC IDC SESS DTM: 20161018080015
MDC IDC STAT BRADY AS VS PERCENT: 1 %

## 2015-01-03 NOTE — Progress Notes (Signed)
Remote ICD transmission.   

## 2015-02-15 ENCOUNTER — Other Ambulatory Visit: Payer: Self-pay | Admitting: Internal Medicine

## 2015-02-17 ENCOUNTER — Other Ambulatory Visit: Payer: Self-pay

## 2015-02-17 MED ORDER — ATORVASTATIN CALCIUM 20 MG PO TABS: 20.0000 mg | ORAL_TABLET | Freq: Every day | ORAL | Status: DC

## 2015-03-28 ENCOUNTER — Ambulatory Visit (INDEPENDENT_AMBULATORY_CARE_PROVIDER_SITE_OTHER): Payer: Medicare Other | Admitting: Internal Medicine

## 2015-03-28 ENCOUNTER — Encounter: Payer: Self-pay | Admitting: Internal Medicine

## 2015-03-28 VITALS — BP 138/84 | HR 67 | Ht 66.0 in | Wt 220.2 lb

## 2015-03-28 DIAGNOSIS — I480 Paroxysmal atrial fibrillation: Secondary | ICD-10-CM

## 2015-03-28 DIAGNOSIS — I429 Cardiomyopathy, unspecified: Secondary | ICD-10-CM | POA: Diagnosis not present

## 2015-03-28 DIAGNOSIS — I428 Other cardiomyopathies: Secondary | ICD-10-CM

## 2015-03-28 DIAGNOSIS — Z95810 Presence of automatic (implantable) cardiac defibrillator: Secondary | ICD-10-CM | POA: Diagnosis not present

## 2015-03-28 DIAGNOSIS — I10 Essential (primary) hypertension: Secondary | ICD-10-CM | POA: Diagnosis not present

## 2015-03-28 DIAGNOSIS — I495 Sick sinus syndrome: Secondary | ICD-10-CM | POA: Diagnosis not present

## 2015-03-28 DIAGNOSIS — I4581 Long QT syndrome: Secondary | ICD-10-CM

## 2015-03-28 DIAGNOSIS — R9431 Abnormal electrocardiogram [ECG] [EKG]: Secondary | ICD-10-CM

## 2015-03-28 HISTORY — DX: Paroxysmal atrial fibrillation: I48.0

## 2015-03-28 LAB — CUP PACEART INCLINIC DEVICE CHECK
Battery Remaining Longevity: 79.2
Brady Statistic RA Percent Paced: 96 %
Brady Statistic RV Percent Paced: 1.5 %
HighPow Impedance: 64.125
Implantable Lead Implant Date: 20160114
Implantable Lead Location: 753859
Implantable Lead Location: 753860
Implantable Lead Model: 7122
Lead Channel Impedance Value: 375 Ohm
Lead Channel Pacing Threshold Amplitude: 0.625 V
Lead Channel Pacing Threshold Pulse Width: 0.5 ms
Lead Channel Pacing Threshold Pulse Width: 0.5 ms
Lead Channel Sensing Intrinsic Amplitude: 11.7 mV
Lead Channel Sensing Intrinsic Amplitude: 2.4 mV
Lead Channel Setting Pacing Amplitude: 1.625
Lead Channel Setting Pacing Amplitude: 2.5 V
MDC IDC LEAD IMPLANT DT: 20160114
MDC IDC MSMT LEADCHNL RV IMPEDANCE VALUE: 350 Ohm
MDC IDC MSMT LEADCHNL RV PACING THRESHOLD AMPLITUDE: 0.75 V
MDC IDC SESS DTM: 20170110125555
MDC IDC SET LEADCHNL RV PACING PULSEWIDTH: 0.5 ms
MDC IDC SET LEADCHNL RV SENSING SENSITIVITY: 0.5 mV
Pulse Gen Serial Number: 7241695

## 2015-03-28 MED ORDER — LOSARTAN POTASSIUM 100 MG PO TABS: 100.0000 mg | ORAL_TABLET | Freq: Every day | ORAL | Status: DC

## 2015-03-28 NOTE — Progress Notes (Signed)
Patient Care Team: Jodi Marble, MD as PCP - General (Internal Medicine)   HPI  Ethan Vega. is a 70 y.o. male Seen following ICD implantation 1/16 following aborted cardiac arrest. Cardiac evaluation at that time included an echocardiogram demonstrating normal left ventricular function had recovered from the EF of 30-35% range. Catheterization 1/16 demonstrated no significant coronary disease;   We undertook thallium for perfusion defects looking for infiltrative processes  thsi was abnormal raising the spectre of sarcoid,  Further eval has been unilluminating   CT-highresolution was neg for interstitial/LN   There were small nodules  Eye exam showed apparently no sarcoid  Flecainide challenge was negative ECG  Low Volts R-V1 with RAD with mild RA enlargement SAECG not done as he was in AFib RVR and rate related RBBB  He was discharged with amiodarone beta blockers and Ace inhibitors; surveillance laboratories 4/16 demonstrated normal ALT and a TSH that had gone from 0.9--4.9.  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 HTN (hypertension)   . HLD (hyperlipidemia)   . Asthma   . Coronary artery disease, non-occlusive     a. cath 03/2014 at [facility]: no sig CAD (OM1 30%, CFX 30%), EF 35%  . Cardiac arrest [facility])     a. 03/30/2014 VF arrest in setting of NICM >> CPR/defib in community >> s/p dual chamber AICD  . DM2 (diabetes mellitus, type 2) ([place])     a. Diet controlled.  . Arthritis     a. L knee.  . Kidney stones   . AICD (automatic cardioverter/defibrillator) present 03/31/14    a. 03/2014 s/p SJM 2411-36C dual chamber AICD (serial Number [id number])- followed by Dr. Caryl Comes  . NICM (nonischemic cardiomyopathy) ([place])     a. 03/2014 Echo:  Inferolateral and lateral HK, EF 50-55%, grade 1  diastolic dysfunction, mild MR, mild LAE, mild RAE, trivial TR, no effusion.  . Presence of combination internal cardiac defibrillator (ICD) and pacemaker 03/30/2014  . Cancer [facility])     Melanoma resected from scalp    Past Surgical History  Procedure Laterality Date  . Tonsillectomy      "as a kid"  . Total knee arthroplasty Left ~ 2010  . Total knee arthroplasty with revision components Left ~ 2011  . Joint replacement    . Cystoscopy w/ litholapaxy / ehl    . Cardioversion  03/30/2014  . Varicose vein surgery Left ~ 2014  . Cardiac catheterization  03/30/2014  . Implantable cardioverter defibrillator implant N/A 03/31/2014    Procedure: IMPLANTABLE CARDIOVERTER DEFIBRILLATOR IMPLANT;  Surgeon: Evans Lance, MD;  Location: [facility] CATH LAB;  Service: Cardiovascular;  Laterality: N/A;    Current Outpatient Prescriptions  Medication Sig Dispense Refill  . amiodarone (PACERONE) 200 MG tablet Take 0.5 tablets (100 mg total) by mouth daily. 30 tablet 3  . atorvastatin (LIPITOR) 20 MG tablet Take 1 tablet (20 mg total) by mouth daily. 30 tablet 1  . carvedilol (COREG) 6.25 MG tablet Take 1 tablet (6.25 mg total) by mouth 2 (two) times daily. 60 tablet 6  . clopidogrel (PLAVIX) 75 MG tablet Take 1 tablet (75 mg total) by mouth daily. 30 tablet 6  . lisinopril (PRINIVIL,ZESTRIL) 20 MG tablet Take 1 tablet (20 mg total) by mouth  2 (two) times daily. 180 tablet 3   No current facility-administered medications for this visit.    No Known Allergies  Review of Systems negative except from HPI and PMH  Physical Exam BP 138/84 mmHg  Pulse 67  Ht 5\' 6"  (1.676 m)  Wt 220 lb 4 oz (99.905 kg)  BMI 35.57 kg/m2 Well developed and well nourished in no acute distress HENT normal E scleral and icterus clear Neck Supple JVP flat; carotids brisk and full Clear to ausculation Device pocket well healed; without hematoma or erythema.  There is no tethering Regular rate and rhythm, no murmurs gallops or  rub Soft with active bowel sounds No clubbing cyanosis  Edema Alert and oriented, grossly normal motor and sensory function Skin Warm and Dry   Atrial pacing at 67 Intervals 15/10/34 neck site incomplete right bundle branch block with right axis deviation Rare PVC  Assessment and  Plan  Aborted Cardiac arrest  ICD  St Jude   Hypertension  Sleep disordered breathing  Sinus bradycardia/chronotropic incompetence  Low Voltage ECG  FHx Cardiac Arrest   Atrial Fibrillation   Cough  Retinal artery occlusion   Blood pressure is reasonably controlled  Interestingly, when he went for his signal average ECG he was noted to be in atrial fibrillation.  In the context of his retinal artery occlusion it would be appropriate at this juncture to begin him on anticoagulation.  I will have him look into the prices of the NOACs    We discussed the use of the NOACs compared to Coumadin. We briefly reviewed the data of at least comparability in stroke prevention, bleeding and outcome. We discussed some of the new once wherein somewhat associated with decreased ischemic stroke risk, one to be taken daily, and has been shown to be comparable and bleeding risk to aspirin.  We also discussed bleeding associated with warfarin as well as NOACs and a wall bleeding as a complication of all these drugs intracranial bleeding is more frequently associated with warfarin then the NOACs and a GI bleeding is more commonly associated with the latter  He also has a cough. He previously poorly tolerated a different ACE inhibitor; I think could be reasonable to start him on an ARB. His blood pressure still modestly elevated. We will increase his carvedilol from 6.25--12.5.  Needs surveillance labs for amio; TSH mildly elvated 7/16 , transaminases normal 4/16

## 2015-03-28 NOTE — Patient Instructions (Signed)
Medication Instructions: 1) Increase coreg (carvedilol) to 12.5 mg one tablet by mouth twice daily 2) Stop lisinopril when your current supply is done. 3) Start Cozaar (losartan) 100 mg one tablet by mouth once daily- (when your lisinopril is completed)   Labwork: - Your physician recommends that you return for lab work today: Liver/ TSH  Procedures/Testing: - none  Follow-Up: - Remote monitoring is used to monitor your Pacemaker of ICD from home. This monitoring reduces the number of office visits required to check your device to one time per year. It allows [place] to keep an eye on the functioning of your device to ensure it is working properly. You are scheduled for a device check from home on 06/27/15. You may send your transmission at any time that day. If you have a wireless device, the transmission will be sent automatically. After your physician reviews your transmission, you will receive a postcard with your next transmission date.  - Your physician wants you to follow-up in: 6 months with Dr. Caryl Comes. You will receive a reminder letter in the mail two months in advance. If you don't receive a letter, please call our office to schedule the follow-up appointment.  Any Additional Special Instructions Will Be Listed Below (If Applicable).

## 2015-03-29 LAB — HEPATIC FUNCTION PANEL
ALK PHOS: 85 IU/L (ref 39–117)
ALT: 31 IU/L (ref 0–44)
AST: 39 IU/L (ref 0–40)
Albumin: 4.5 g/dL (ref 3.5–4.8)
BILIRUBIN, DIRECT: 0.31 mg/dL (ref 0.00–0.40)
Bilirubin Total: 2.4 mg/dL — ABNORMAL HIGH (ref 0.0–1.2)
Total Protein: 6.6 g/dL (ref 6.0–8.5)

## 2015-03-29 LAB — TSH: TSH: 6.41 u[IU]/mL — AB (ref 0.450–4.500)

## 2015-03-31 ENCOUNTER — Other Ambulatory Visit: Payer: Self-pay | Admitting: *Deleted

## 2015-03-31 MED ORDER — CARVEDILOL 12.5 MG PO TABS: 12.5000 mg | ORAL_TABLET | Freq: Two times a day (BID) | ORAL | Status: DC

## 2015-04-14 ENCOUNTER — Encounter: Payer: Self-pay | Admitting: Internal Medicine

## 2015-05-05 ENCOUNTER — Other Ambulatory Visit: Payer: Self-pay | Admitting: Internal Medicine

## 2015-05-07 ENCOUNTER — Other Ambulatory Visit: Payer: Self-pay | Admitting: Internal Medicine

## 2015-06-13 ENCOUNTER — Other Ambulatory Visit: Payer: Self-pay | Admitting: Internal Medicine

## 2015-06-27 ENCOUNTER — Ambulatory Visit (INDEPENDENT_AMBULATORY_CARE_PROVIDER_SITE_OTHER): Payer: Medicare Other | Admitting: *Deleted

## 2015-06-27 ENCOUNTER — Telehealth: Payer: Self-pay | Admitting: Cardiology

## 2015-06-27 DIAGNOSIS — R9431 Abnormal electrocardiogram [ECG] [EKG]: Secondary | ICD-10-CM

## 2015-06-27 DIAGNOSIS — Z95810 Presence of automatic (implantable) cardiac defibrillator: Secondary | ICD-10-CM | POA: Diagnosis not present

## 2015-06-27 DIAGNOSIS — I4581 Long QT syndrome: Secondary | ICD-10-CM

## 2015-06-27 DIAGNOSIS — I495 Sick sinus syndrome: Secondary | ICD-10-CM | POA: Diagnosis not present

## 2015-06-27 NOTE — Telephone Encounter (Signed)
Spoke with pt and reminded pt of remote transmission that is due today. Pt verbalized understanding.   

## 2015-06-28 NOTE — Progress Notes (Signed)
Remote ICD transmission.   

## 2015-08-07 LAB — CUP PACEART REMOTE DEVICE CHECK
Battery Remaining Percentage: 84 %
Battery Voltage: 2.99 V
Brady Statistic AS VS Percent: 1.2 %
Brady Statistic RA Percent Paced: 97 %
Date Time Interrogation Session: 20170412044011
HIGH POWER IMPEDANCE MEASURED VALUE: 69 Ohm
HIGH POWER IMPEDANCE MEASURED VALUE: 69 Ohm
Implantable Lead Location: 753859
Lead Channel Impedance Value: 350 Ohm
Lead Channel Impedance Value: 380 Ohm
Lead Channel Pacing Threshold Pulse Width: 0.5 ms
Lead Channel Sensing Intrinsic Amplitude: 11.7 mV
Lead Channel Setting Pacing Amplitude: 1.625
Lead Channel Setting Sensing Sensitivity: 0.5 mV
MDC IDC LEAD IMPLANT DT: 20160114
MDC IDC LEAD IMPLANT DT: 20160114
MDC IDC LEAD LOCATION: 753860
MDC IDC LEAD MODEL: 7122
MDC IDC MSMT BATTERY REMAINING LONGEVITY: 74 mo
MDC IDC MSMT LEADCHNL RA PACING THRESHOLD AMPLITUDE: 0.625 V
MDC IDC MSMT LEADCHNL RA SENSING INTR AMPL: 2.1 mV
MDC IDC SET LEADCHNL RV PACING AMPLITUDE: 2.5 V
MDC IDC SET LEADCHNL RV PACING PULSEWIDTH: 0.5 ms
MDC IDC STAT BRADY AP VP PERCENT: 1.3 %
MDC IDC STAT BRADY AP VS PERCENT: 97 %
MDC IDC STAT BRADY AS VP PERCENT: 1 %
MDC IDC STAT BRADY RV PERCENT PACED: 1.3 %
Pulse Gen Serial Number: 7241695

## 2015-08-08 ENCOUNTER — Encounter: Payer: Self-pay | Admitting: Cardiology

## 2015-08-25 ENCOUNTER — Telehealth: Payer: Self-pay | Admitting: Internal Medicine

## 2015-08-25 ENCOUNTER — Encounter: Payer: Self-pay | Admitting: *Deleted

## 2015-08-25 NOTE — Telephone Encounter (Signed)
Pt c/o medication issue:  1. Name of Medication: Coreg  2. How are you currently taking this medication (dosage and times per day)? 6.25 po x 2 daily   3. Are you having a reaction (difficulty breathing--STAT)? no  4. What is your medication issue? Patient has new bottle that says 12.5 mg po x 2 daily and wants to confirm correct dosage   Please call to confirm increase with patient

## 2015-08-25 NOTE — Telephone Encounter (Signed)
Patient called in and was very confused about his medications. He was not clear on the change in his coreg. Reviewed his medications and he was still confused and had not been on the losartan at all. We had a very lengthy discussion regarding his medications and I reviewed them multiple times as he was not understanding. Reviewed his medication list with him one by one and had him write down the medication, dosage, and when to take it. He stated that he had not received the losartan and I see that it was sent to [facility] back on 03/28/2015. He requested that I call and see why they had not sent that medication to him and provided me with that number. Offered to send a medication list and he said that would be helpful (see letter under chart review). He did state that his primary care physician did decrease his atorvastatin to 10 mg daily. Let him know that I would mail out a list for his personal use and to please call back if he has any further questions. He verbalized understanding of our conversation and had no further questions at this time.   Called [facility] and spoke with the pharmacist. She reported that they did not receive the prescription from 03/28/15 at 10:54 am and also had not received a discontinue order for the lisinopril that he was on. Gave her a verbal order for that and also confirmed the current dosage of atorvastatin with her to be 10 mg once daily. She stated that she made the changes in her system and would send out that medication to the patient.

## 2015-08-30 ENCOUNTER — Telehealth: Payer: Self-pay | Admitting: Internal Medicine

## 2015-08-30 NOTE — Telephone Encounter (Signed)
Pt presented to the office today stating he is concerned that his BP is too high. We spoke in the lobby and he reports normal BP 128/60 but since speaking with Pam, RN on 6/9 and clarifying medications his SBP has been 140. He checks his BP each morning prior to taking medications but does not record readings. He stopped lisinopril June 11, expecting losartan to arrive via mail June 12. It did not and he did not take lisinopril that day.  He started losartan 100mg  qd as directed at [facility] on June 13. I reviewed all medications w/pt who confirms he is now taking as prescribed in January. Advised pt to follow low sodium diet, check BP prior to and one hour after medications and keep a log.  He understands to report consistent elevated blood pressures to [place]. States he knows Dr. Caryl Comes will "chew him out" at [facility] for not taking meds properly but states he doesn't remember getting any paperwork reflecting changes. Advised pt to always be sure he has paperwork before he leaves [facility] and to always feel free to ask questions and review changes w/MD or nurse. Assured him Dr. Caryl Comes will not be upset with him.  Pt verbalized understanding and is appreciative of time spent w/him reviewing information.

## 2015-10-17 ENCOUNTER — Encounter: Payer: Self-pay | Admitting: Internal Medicine

## 2015-10-17 ENCOUNTER — Ambulatory Visit (INDEPENDENT_AMBULATORY_CARE_PROVIDER_SITE_OTHER): Payer: Medicare Other | Admitting: Internal Medicine

## 2015-10-17 VITALS — BP 130/84 | HR 60 | Ht 66.0 in | Wt 225.5 lb

## 2015-10-17 DIAGNOSIS — I4589 Other specified conduction disorders: Secondary | ICD-10-CM

## 2015-10-17 DIAGNOSIS — I469 Cardiac arrest, cause unspecified: Secondary | ICD-10-CM | POA: Diagnosis not present

## 2015-10-17 DIAGNOSIS — I429 Cardiomyopathy, unspecified: Secondary | ICD-10-CM

## 2015-10-17 DIAGNOSIS — Z95810 Presence of automatic (implantable) cardiac defibrillator: Secondary | ICD-10-CM | POA: Diagnosis not present

## 2015-10-17 DIAGNOSIS — I428 Other cardiomyopathies: Secondary | ICD-10-CM

## 2015-10-17 DIAGNOSIS — I495 Sick sinus syndrome: Secondary | ICD-10-CM | POA: Diagnosis not present

## 2015-10-17 DIAGNOSIS — I480 Paroxysmal atrial fibrillation: Secondary | ICD-10-CM

## 2015-10-17 LAB — CUP PACEART INCLINIC DEVICE CHECK
Brady Statistic RV Percent Paced: 1.4 %
HighPow Impedance: 65.25 Ohm
Implantable Lead Implant Date: 20160114
Implantable Lead Location: 753859
Lead Channel Impedance Value: 350 Ohm
Lead Channel Pacing Threshold Pulse Width: 0.5 ms
Lead Channel Sensing Intrinsic Amplitude: 11.7 mV
Lead Channel Sensing Intrinsic Amplitude: 2.7 mV
Lead Channel Setting Pacing Pulse Width: 0.5 ms
MDC IDC LEAD IMPLANT DT: 20160114
MDC IDC LEAD LOCATION: 753860
MDC IDC LEAD MODEL: 7122
MDC IDC MSMT BATTERY REMAINING LONGEVITY: 74.4
MDC IDC MSMT LEADCHNL RA IMPEDANCE VALUE: 400 Ohm
MDC IDC MSMT LEADCHNL RA PACING THRESHOLD AMPLITUDE: 0.625 V
MDC IDC MSMT LEADCHNL RV PACING THRESHOLD AMPLITUDE: 1.25 V
MDC IDC MSMT LEADCHNL RV PACING THRESHOLD PULSEWIDTH: 0.5 ms
MDC IDC SESS DTM: 20170801121941
MDC IDC SET LEADCHNL RA PACING AMPLITUDE: 1.625
MDC IDC SET LEADCHNL RV PACING AMPLITUDE: 2.5 V
MDC IDC SET LEADCHNL RV SENSING SENSITIVITY: 0.5 mV
MDC IDC STAT BRADY RA PERCENT PACED: 97 %
Pulse Gen Serial Number: 7241695

## 2015-10-17 NOTE — Progress Notes (Signed)
Patient Care Team: Jodi Marble, MD as PCP - General (Internal Medicine)   HPI  Ethan Vega. is a 70 y.o. male Seen following ICD implantation 1/16 following aborted cardiac arrest. Cardiac evaluation at that time included an echocardiogram demonstrating normal left ventricular function had recovered from the EF of 30-35% range. Catheterization 1/16 demonstrated no significant coronary disease;   We undertook thallium for perfusion defects looking for infiltrative processes  this was abnormal raising the spectre of sarcoid,  Further eval has been unilluminating   CT-highresolution was neg for interstitial/LN   There were small nodules  Eye exam showed apparently no sarcoid  Flecainide challenge was negative ECG  Low Volts R-V1 with RAD with mild RA enlargement SAECG not done as he was in AFib RVR and rate related RBBB  He was discharged with amiodarone beta blockers and Ace inhibitors;   Surveillance laboratories 4/16 demonstrated normal ALT and a TSH that had gone from 0.9--4.9. 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 NOAC therapy was recommended but not consummated because of cost   Remains on Plavix/ASA   No chest pain or sob  Walking to try and lose weight--L knee is hinderance  Past Medical History:  Diagnosis Date  . AICD (automatic cardioverter/defibrillator) present 03/31/14   a. 03/2014 s/p SJM 2411-36C dual chamber AICD (serial Number [id number])- followed by Dr. Caryl Comes  . Arthritis    a. L knee.  . Asthma   . Cancer ([place])    Melanoma resected from scalp  . Cardiac arrest [facility])    a. 03/30/2014 VF arrest in setting of NICM >> CPR/defib in community >> s/p dual chamber AICD  . Coronary artery  disease, non-occlusive    a. cath 03/2014 at [facility]: no sig CAD (OM1 30%, CFX 30%), EF 35%  . DM2 (diabetes mellitus, type 2) ([place])    a. Diet controlled.  Marland Kitchen HLD (hyperlipidemia)   . HTN (hypertension)   . Kidney stones   . NICM (nonischemic cardiomyopathy) ([place])    a. 03/2014 Echo:  Inferolateral and lateral HK, EF 99991111, grade 1 diastolic dysfunction, mild MR, mild LAE, mild RAE, trivial TR, no effusion.  . Paroxysmal atrial fibrillation ([place]) 03/28/2015  . Presence of combination internal cardiac defibrillator (ICD) and pacemaker 03/30/2014    Past Surgical History:  Procedure Laterality Date  . CARDIAC CATHETERIZATION  03/30/2014  . CARDIOVERSION  03/30/2014  . CYSTOSCOPY W/ LITHOLAPAXY / EHL    . IMPLANTABLE CARDIOVERTER DEFIBRILLATOR IMPLANT N/A 03/31/2014   Procedure: IMPLANTABLE CARDIOVERTER DEFIBRILLATOR IMPLANT;  Surgeon: Evans Lance, MD;  Location: [facility] CATH LAB;  Service: Cardiovascular;  Laterality: N/A;  . JOINT REPLACEMENT    . TONSILLECTOMY     "as a kid"  . TOTAL KNEE ARTHROPLASTY Left ~ 2010  . TOTAL KNEE ARTHROPLASTY WITH REVISION COMPONENTS Left ~ 2011  . VARICOSE VEIN SURGERY Left ~ 2014    Current Outpatient Prescriptions  Medication Sig Dispense Refill  . amiodarone (PACERONE) 200 MG tablet Take one-half tablet by  mouth daily 45 tablet 3  . atorvastatin (LIPITOR) 10 MG tablet Take 10 mg by mouth daily.    . budesonide-formoterol (SYMBICORT) 160-4.5  MCG/ACT inhaler Inhale 2 puffs into the lungs daily.    . carvedilol (COREG) 12.5 MG tablet Take 1 tablet (12.5 mg total) by mouth 2 (two) times daily. 180 tablet 3  . clopidogrel (PLAVIX) 75 MG tablet Take 1 tablet by mouth  daily 90 tablet 3  . fexofenadine (ALLEGRA) 180 MG tablet Take 180 mg by mouth daily.    . fluticasone (VERAMYST) 27.5 MCG/SPRAY nasal spray Place 2 sprays into the nose daily.    Marland Kitchen losartan (COZAAR) 100 MG tablet Take 1 tablet (100 mg total) by mouth daily. 90 tablet 3  . Multiple Vitamin  (MULTIVITAMIN) capsule Take 1 capsule by mouth daily.    . Omega-3 Fatty Acids (FISH OIL) 1200 MG CAPS Take by mouth daily.     No current facility-administered medications for this visit.     No Known Allergies  Review of Systems negative except from HPI and PMH  Physical Exam BP 130/84 (BP Location: Left Arm, Patient Position: Sitting, Cuff Size: Large)   Pulse 60   Ht 5\' 6"  (1.676 m)   Wt 225 lb 8 oz (102.3 kg)   BMI 36.40 kg/m  Well developed and well nourished in no acute distress HENT normal E scleral and icterus clear Neck Supple JVP flat; carotids brisk and full Clear to ausculation Device pocket well healed; without hematoma or erythema.  There is no tethering Regular rate and rhythm, no murmurs gallops or rub Soft with active bowel sounds No clubbing cyanosis  Edema Alert and oriented, grossly normal motor and sensory function Skin Warm and Dry   Atrial pacing at 67 Intervals 15/10/34 neck site incomplete right bundle branch block with right axis deviation Rare PVC  Assessment and  Plan  Aborted Cardiac arrest//FHx Cardiac Arrest   ICD  St Jude   Hypertension  Sleep disordered breathing  Sinus bradycardia/chronotropic incompetence  Low Voltage ECG  Atrial Fibrillation   Retinal artery occlusion  No intercurrent Ventricular tachycardia  Blood pressure is reasonably controlled  Interestingly, when he went for his signal average ECG he was noted to be in atrial fibrillation.  In the context of his retinal artery occlusion it would be appropriate at this juncture to begin him on anticoagulation.  I will have him look into the prices of the NOACs    We discussed the use of the NOACs compared to Coumadin. We briefly reviewed the data of at least comparability in stroke prevention, bleeding and outcome. We discussed some of the new once wherein somewhat associated with decreased ischemic stroke risk,   We also discussed bleeding associated with warfarin  as well as NOACs and a wall bleeding as a complication of all these drugs intracranial bleeding is more frequently associated with warfarin then the NOACs and a GI bleeding is more commonly associated with the latter  Finances are limited. He is on [employer]  Will give him LIS form  Needs surveillance labs for amio; TSH mildly elvated 7/16 , transaminases normal 4/16  Have encouraged him to look for a job

## 2015-10-17 NOTE — Patient Instructions (Signed)
Medication Instructions: - Your physician recommends that you continue on your current medications as directed. Please refer to the Current Medication list given to you today.  Labwork: - Your physician recommends that you have lab work today: TSH/ Liver  Procedures/Testing: - none  Follow-Up: - Remote monitoring is used to monitor your Pacemaker of ICD from home. This monitoring reduces the number of office visits required to check your device to one time per year. It allows [place] to keep an eye on the functioning of your device to ensure it is working properly. You are scheduled for a device check from home on 01/16/16. You may send your transmission at any time that day. If you have a wireless device, the transmission will be sent automatically. After your physician reviews your transmission, you will receive a postcard with your next transmission date.  - Your physician wants you to follow-up in: 6 months with Dr. Caryl Comes. You will receive a reminder letter in the mail two months in advance. If you don't receive a letter, please call our office to schedule the follow-up appointment.   Any Additional Special Instructions Will Be Listed Below (If Applicable).     If you need a refill on your cardiac medications before your next appointment, please call your pharmacy.

## 2015-10-18 LAB — HEPATIC FUNCTION PANEL
ALT: 29 IU/L (ref 0–44)
AST: 37 IU/L (ref 0–40)
Albumin: 4.1 g/dL (ref 3.5–4.8)
Alkaline Phosphatase: 72 IU/L (ref 39–117)
BILIRUBIN TOTAL: 2.8 mg/dL — AB (ref 0.0–1.2)
BILIRUBIN, DIRECT: 0.22 mg/dL (ref 0.00–0.40)
TOTAL PROTEIN: 6.3 g/dL (ref 6.0–8.5)

## 2015-10-18 LAB — TSH: TSH: 4.85 u[IU]/mL — AB (ref 0.450–4.500)

## 2015-10-24 ENCOUNTER — Other Ambulatory Visit: Payer: Self-pay | Admitting: *Deleted

## 2015-10-24 DIAGNOSIS — R7989 Other specified abnormal findings of blood chemistry: Secondary | ICD-10-CM

## 2015-10-26 ENCOUNTER — Other Ambulatory Visit (INDEPENDENT_AMBULATORY_CARE_PROVIDER_SITE_OTHER): Payer: Medicare Other | Admitting: *Deleted

## 2015-10-26 DIAGNOSIS — R7989 Other specified abnormal findings of blood chemistry: Secondary | ICD-10-CM | POA: Diagnosis not present

## 2015-10-27 LAB — T3, FREE: T3, Free: 3 pg/mL (ref 2.0–4.4)

## 2015-10-27 LAB — T4, FREE: FREE T4: 1.08 ng/dL (ref 0.82–1.77)

## 2015-10-30 ENCOUNTER — Encounter: Payer: Self-pay | Admitting: Internal Medicine

## 2015-12-21 ENCOUNTER — Telehealth: Payer: Self-pay | Admitting: Internal Medicine

## 2015-12-21 NOTE — Telephone Encounter (Signed)
Spoke with the patient regarding the switch from Plavix to Eliquis per Dr. Olin Pia request. The patient was to check to see how much the Eliquis would cost the patient and he states, he can't afford $298.00 for 90 day supply.  I have suggested the patient fill out the forms for Eliquis patient assistance coverage to see if he will qualify.

## 2015-12-21 NOTE — Telephone Encounter (Signed)
Patient came to office today with Questions about possibly switching to Eliquis from Plavix.    Patient is concerned about cost of med and wants to check and see if there are assistance options to lower the cost.  Patient does not want to try samples and then not be able to afford to keep taking them.    Patient given Medication mag clinic application and is now aware there are coupons and assistance programs through the drug company. Please call to discuss further what we may be able to approve for him before actually switching to Eliqus.

## 2015-12-22 NOTE — Telephone Encounter (Signed)
The patient will not be given samples of Eliquis until he gets approved. The patient will continue on the plavix for now.

## 2015-12-22 NOTE — Telephone Encounter (Signed)
Please pick up forms for patient assistance program on Eliquis.  Patient will be provided samples of Eliquis until he is approved with the patient assistance program.

## 2015-12-27 ENCOUNTER — Telehealth: Payer: Self-pay | Admitting: Internal Medicine

## 2015-12-27 NOTE — Telephone Encounter (Signed)
Patient states that he has talked to [facility] about medication cost for Eliquis but they cannot give him information about cost and tier until they have a rx or a strength for dose he would be taking.  Please call patient to discuss.     Patient says that insurance wants PA/ tier exeption  done.  Please also call [phone] to discuss .

## 2015-12-28 NOTE — Telephone Encounter (Signed)
lmtcb Regarding patient assistance forms.

## 2015-12-28 NOTE — Telephone Encounter (Signed)
No answer left message to contact office.

## 2015-12-29 ENCOUNTER — Telehealth: Payer: Self-pay | Admitting: Internal Medicine

## 2015-12-29 DIAGNOSIS — I480 Paroxysmal atrial fibrillation: Secondary | ICD-10-CM

## 2015-12-29 NOTE — Telephone Encounter (Signed)
Patient has a large deductible and needs samples for Eliquis. Dr. Caryl Comes wants him off of Plavix.

## 2015-12-29 NOTE — Telephone Encounter (Signed)
Spoke with Ethan Vega regarding the eliquis, told him to continue taking the plavix until we here back from Dr. Caryl Comes on the instructions and correct dose for the Eliquis.  The patient understands instructions and will continue with the plavix.

## 2015-12-29 NOTE — Telephone Encounter (Signed)
Spoke with Ivin Booty in [place] office.  Informed her patient should be on full dose Eliquis (5mg  BID) per protocol.  However, will forward this to Dr. Olin Pia  nurse to address next week w/ physician.  Need confirmation ok to stop Plavix as he does not mention stopping this medication in his last office note. Ivin Booty will advise patient that nurse will contact him next week with orders.

## 2015-12-29 NOTE — Telephone Encounter (Signed)
If you discussed switching Ethan Vega from plavix to eliquis, what dosage do your recommend he be on?  Please advise.

## 2016-01-01 NOTE — Telephone Encounter (Signed)
He should probably be on Eliquis 5 mg BID, but his creatinine has been checked, that I can see since April 2016. Will forward to Dr. Caryl Comes to review and see if labs need to be updated first.

## 2016-01-02 ENCOUNTER — Other Ambulatory Visit
Admission: RE | Admit: 2016-01-02 | Discharge: 2016-01-02 | Disposition: A | Discharge status: Home / Self Care | Payer: Medicare Other | Source: Ambulatory Visit | Attending: Internal Medicine | Admitting: Internal Medicine

## 2016-01-02 DIAGNOSIS — I480 Paroxysmal atrial fibrillation: Secondary | ICD-10-CM | POA: Diagnosis present

## 2016-01-02 LAB — CBC WITH DIFFERENTIAL/PLATELET
Basophils Absolute: 0.1 10*3/uL (ref 0–0.1)
Basophils Relative: 1 %
EOS ABS: 0.2 10*3/uL (ref 0–0.7)
Eosinophils Relative: 3 %
HEMATOCRIT: 44.7 % (ref 40.0–52.0)
HEMOGLOBIN: 14.7 g/dL (ref 13.0–18.0)
LYMPHS ABS: 1 10*3/uL (ref 1.0–3.6)
Lymphocytes Relative: 21 %
MCH: 30.6 pg (ref 26.0–34.0)
MCHC: 32.8 g/dL (ref 32.0–36.0)
MCV: 93.1 fL (ref 80.0–100.0)
Monocytes Absolute: 0.4 10*3/uL (ref 0.2–1.0)
Monocytes Relative: 8 %
NEUTROS ABS: 3.2 10*3/uL (ref 1.4–6.5)
NEUTROS PCT: 67 %
Platelets: 208 10*3/uL (ref 150–440)
RBC: 4.8 MIL/uL (ref 4.40–5.90)
RDW: 13.7 % (ref 11.5–14.5)
WBC: 4.9 10*3/uL (ref 3.8–10.6)

## 2016-01-02 LAB — BASIC METABOLIC PANEL
Anion gap: 8 (ref 5–15)
BUN: 20 mg/dL (ref 6–20)
CALCIUM: 9.6 mg/dL (ref 8.9–10.3)
CO2: 26 mmol/L (ref 22–32)
CREATININE: 0.82 mg/dL (ref 0.61–1.24)
Chloride: 106 mmol/L (ref 101–111)
GFR calc Af Amer: >60 mL/min (ref >60)
GLUCOSE: 108 mg/dL — AB (ref 65–99)
Potassium: 4.2 mmol/L (ref 3.5–5.1)
SODIUM: 140 mmol/L (ref 135–145)

## 2016-01-02 NOTE — Telephone Encounter (Signed)
Patient came to office to check response and to see which person he should talk to [place] or heather.  Please call patient back when response received from md.

## 2016-01-02 NOTE — Telephone Encounter (Signed)
Per Dr. Caryl Comes- will need a repeat BMP/ CBC to verify that his renal function is ok for eliquis 5 mg BID. I saw the patient in the hall in the office when I was sending a patient out.  He is agreeable with labs- will go to [facility] today for labs.

## 2016-01-03 ENCOUNTER — Telehealth: Payer: Self-pay | Admitting: Internal Medicine

## 2016-01-03 NOTE — Telephone Encounter (Signed)
NEw Message  Antionette from [employer] call requesting to speak with RN about a pre authorization for pt. Please call back to discuss

## 2016-01-04 NOTE — Telephone Encounter (Signed)
I called and spoke with a rep from [employer] and they were stating that the pharmacy had not received the PA back on this patient and they were calling to see how they could assist. I advised the rep that I was unsure of a PA that was started, but will inquire from our CMA's to see if they are aware of this. They state they will resend forms if needed.  Sharon/ Lenda Kelp- do either of you know about a PA for this patient?

## 2016-01-04 NOTE — Telephone Encounter (Signed)
Please review regarding Eliquis.

## 2016-01-04 NOTE — Telephone Encounter (Deleted)
I called and spoke with a rep from [employer] and they were stating that the pharmacy had not received the PA back on this patient and they were calling to see how they could assist. I advised the rep that I was unsure of a PA that was started, but will inquire from our CMA's to see if they are aware of this. They state they will resend forms if needed.  Sharon/ Lenda Kelp- do either of you know about a PA for this patient?

## 2016-01-05 NOTE — Telephone Encounter (Signed)
Can you ask Dr. Caryl Comes if he is going to start this patient on Eliquis? The patient would like to start the Eliquis and stop plavix but Dr. Caryl Comes has not yet said, what dose and instructions on how to take nor a Rx sent in for him. Please discuss with Dr. Caryl Comes since the patient has not had a Rx sent in yet.

## 2016-01-05 NOTE — Telephone Encounter (Signed)
Please advise based on labs if the patient is to start on Eliquis 5 mg twice a day and if you can send in Rx to his pharmacy. I will take care of the prior authorization.

## 2016-01-09 NOTE — Telephone Encounter (Signed)
Pt came in and is asking about starting Eliquis. He would like a call so he can know the status of this. Please call.

## 2016-01-09 NOTE — Telephone Encounter (Signed)
Patient calling in to see the status on his Eliquis. I do not see an order for this medication and let him know that is why no prior authorization has been done. We can't do prior authorization until after it has been ordered by a physician. I see where it had been discussed in various phone notes but I do not see an order in for this medication. Let him know that I would check with Nira Conn RN, Dr. Olin Pia nurse and we would call him with an update. He verbalized some frustration and let him know that I was sorry for any confusion but I would follow up and someone would give him a call. He verbalized understanding of our conversation and had no further questions at this time.

## 2016-01-10 MED ORDER — APIXABAN 5 MG PO TABS: 5.0000 mg | ORAL_TABLET | Freq: Two times a day (BID) | ORAL | 11 refills | Status: DC

## 2016-01-10 NOTE — Telephone Encounter (Signed)
See 10/18 phone note.

## 2016-01-10 NOTE — Telephone Encounter (Signed)
Patient is also aware he will stop plavix when he starts eliquis.

## 2016-01-10 NOTE — Telephone Encounter (Signed)
I spoke with the patient: age < 25, wt > 60 kg, serum creatinine- 0.82 on 10/17. He is aware to start eliquis 5 mg BID. He is also aware that samples are available at the front desk in out [place] office to pick up. I explained to him that when Dr. Caryl Comes returns next week, I will have him sign a RX and give him a free 30-day trial card as well. He is agreeable and voices understanding.

## 2016-01-10 NOTE — Telephone Encounter (Signed)
Medication Samples have been placed up front for patient to pick up per Alvis Lemmings RN request.  Drug name: Eliquis       Strength: 5 mg        Qty: 4 boxes LOT: [id number] Exp.Date: [facility] 2020

## 2016-01-11 ENCOUNTER — Telehealth: Payer: Self-pay | Admitting: Internal Medicine

## 2016-01-11 NOTE — Telephone Encounter (Signed)
New message      Calling to speak with the nurse regarding a prior authorization/tier exception for this pt

## 2016-01-11 NOTE — Telephone Encounter (Signed)
Patient picked up samples

## 2016-01-12 ENCOUNTER — Telehealth: Payer: Self-pay

## 2016-01-12 ENCOUNTER — Other Ambulatory Visit: Payer: Self-pay

## 2016-01-12 MED ORDER — APIXABAN 5 MG PO TABS: 5.0000 mg | ORAL_TABLET | Freq: Two times a day (BID) | ORAL | 1 refills | Status: DC

## 2016-01-12 NOTE — Telephone Encounter (Signed)
Prior auth for Eliquis 5mg  obtained through [facility]. PA- [id number], and is good through 03/17/2017.

## 2016-01-12 NOTE — Telephone Encounter (Signed)
Spoke with patient about his Eliquis 5mg . He feels quite sure he will not be able to afford it. Has samples for a month, plus a coupon for 30 free days at the local pharmacy. This should take him until January 1, when the insurance starts all over again. I sent in a Rx to [facility] in [place] for 30 days supply with a refill.

## 2016-01-16 ENCOUNTER — Ambulatory Visit (INDEPENDENT_AMBULATORY_CARE_PROVIDER_SITE_OTHER): Payer: Medicare Other | Admitting: *Deleted

## 2016-01-16 ENCOUNTER — Telehealth: Payer: Self-pay

## 2016-01-16 DIAGNOSIS — I428 Other cardiomyopathies: Secondary | ICD-10-CM | POA: Diagnosis not present

## 2016-01-16 NOTE — Telephone Encounter (Signed)
Pt is aware and agreeable to normal results. Pt wanted me to tell Dr. Caryl Comes that he started his first dose of Eliquis today and is extremely thankful to Dr. Caryl Comes for all the help he provided in him receiving that medication. Pt also wanted to confirm that today was his remote transmission day. I confirmed with pt date and time of remote, he was agreeable.

## 2016-01-17 NOTE — Progress Notes (Signed)
Remote ICD transmission.   

## 2016-01-18 NOTE — Telephone Encounter (Signed)
RX and #30- free trial card mailed to the patient 10/31.

## 2016-01-24 ENCOUNTER — Encounter: Payer: Self-pay | Admitting: Cardiology

## 2016-02-13 ENCOUNTER — Telehealth: Payer: Self-pay | Admitting: Internal Medicine

## 2016-02-14 NOTE — Telephone Encounter (Signed)
Patient calling the office for samples of medication:  1.  What medication and dosage are you requesting samples for? Eliquis 5 mg  2.  Are you currently out of this medication?  He is just needing to get to the end of the year he has some left

## 2016-02-14 NOTE — Telephone Encounter (Signed)
Eliquis 5 mg samples placed at front desk for pick up. 

## 2016-02-15 LAB — CUP PACEART REMOTE DEVICE CHECK
Battery Remaining Percentage: 79 %
Brady Statistic AP VP Percent: 2.3 %
Brady Statistic AP VS Percent: 97 %
Brady Statistic AS VP Percent: 1 %
Brady Statistic AS VS Percent: 1 %
Brady Statistic RV Percent Paced: 2.3 %
HighPow Impedance: 70 Ohm
HighPow Impedance: 70 Ohm
Implantable Lead Implant Date: 20160114
Implantable Lead Location: 753859
Implantable Pulse Generator Implant Date: 20160114
Lead Channel Impedance Value: 380 Ohm
Lead Channel Pacing Threshold Amplitude: 0.625 V
Lead Channel Pacing Threshold Pulse Width: 0.5 ms
Lead Channel Pacing Threshold Pulse Width: 0.5 ms
Lead Channel Sensing Intrinsic Amplitude: 2.2 mV
Lead Channel Setting Pacing Amplitude: 2.5 V
MDC IDC LEAD IMPLANT DT: 20160114
MDC IDC LEAD LOCATION: 753860
MDC IDC LEAD MODEL: 7122
MDC IDC MSMT BATTERY REMAINING LONGEVITY: 69 mo
MDC IDC MSMT BATTERY VOLTAGE: 2.96 V
MDC IDC MSMT LEADCHNL RV IMPEDANCE VALUE: 350 Ohm
MDC IDC MSMT LEADCHNL RV PACING THRESHOLD AMPLITUDE: 1.25 V
MDC IDC MSMT LEADCHNL RV SENSING INTR AMPL: 9 mV
MDC IDC PG SERIAL: 7241695
MDC IDC SESS DTM: 20171031080017
MDC IDC SET LEADCHNL RA PACING AMPLITUDE: 1.625
MDC IDC SET LEADCHNL RV PACING PULSEWIDTH: 0.5 ms
MDC IDC SET LEADCHNL RV SENSING SENSITIVITY: 0.5 mV
MDC IDC STAT BRADY RA PERCENT PACED: 98 %

## 2016-03-01 ENCOUNTER — Other Ambulatory Visit: Payer: Self-pay | Admitting: Internal Medicine

## 2016-03-21 ENCOUNTER — Other Ambulatory Visit: Payer: Self-pay | Admitting: Internal Medicine

## 2016-03-21 ENCOUNTER — Telehealth: Payer: Self-pay | Admitting: Internal Medicine

## 2016-03-21 MED ORDER — APIXABAN 5 MG PO TABS: 5.0000 mg | ORAL_TABLET | Freq: Two times a day (BID) | ORAL | 3 refills | Status: DC

## 2016-03-21 NOTE — Telephone Encounter (Signed)
Sent refill

## 2016-03-21 NOTE — Telephone Encounter (Signed)
°*  STAT* If patient is at the pharmacy, call can be transferred to refill team.   1. Which medications need to be refilled? (please list name of each medication and dose if known)  Eliquis 5 mg   2. Which pharmacy/location (including street and city if local pharmacy) is medication to be sent to? [facility]   3. Do they need a 30 day or 90 day supply?  90 Day

## 2016-04-16 ENCOUNTER — Encounter: Payer: Self-pay | Admitting: Internal Medicine

## 2016-04-16 ENCOUNTER — Telehealth: Payer: Self-pay | Admitting: Internal Medicine

## 2016-04-16 ENCOUNTER — Ambulatory Visit (INDEPENDENT_AMBULATORY_CARE_PROVIDER_SITE_OTHER): Payer: Medicare Other | Admitting: Internal Medicine

## 2016-04-16 VITALS — BP 142/80 | HR 60 | Ht 66.0 in | Wt 226.2 lb

## 2016-04-16 DIAGNOSIS — I428 Other cardiomyopathies: Secondary | ICD-10-CM

## 2016-04-16 DIAGNOSIS — I469 Cardiac arrest, cause unspecified: Secondary | ICD-10-CM

## 2016-04-16 DIAGNOSIS — I480 Paroxysmal atrial fibrillation: Secondary | ICD-10-CM

## 2016-04-16 DIAGNOSIS — Z95810 Presence of automatic (implantable) cardiac defibrillator: Secondary | ICD-10-CM

## 2016-04-16 DIAGNOSIS — Z79899 Other long term (current) drug therapy: Secondary | ICD-10-CM

## 2016-04-16 LAB — CUP PACEART INCLINIC DEVICE CHECK
Brady Statistic RV Percent Paced: 2 %
HighPow Impedance: 68.625
Implantable Lead Implant Date: 20160114
Implantable Lead Location: 753860
Implantable Lead Model: 7122
Implantable Pulse Generator Implant Date: 20160114
Lead Channel Impedance Value: 362.5 Ohm
Lead Channel Pacing Threshold Amplitude: 1.25 V
Lead Channel Pacing Threshold Pulse Width: 0.5 ms
Lead Channel Sensing Intrinsic Amplitude: 11.7 mV
Lead Channel Sensing Intrinsic Amplitude: 2.5 mV
Lead Channel Setting Pacing Amplitude: 1.75 V
Lead Channel Setting Pacing Pulse Width: 0.5 ms
Lead Channel Setting Sensing Sensitivity: 0.5 mV
MDC IDC LEAD IMPLANT DT: 20160114
MDC IDC LEAD LOCATION: 753859
MDC IDC MSMT LEADCHNL RA IMPEDANCE VALUE: 412.5 Ohm
MDC IDC MSMT LEADCHNL RA PACING THRESHOLD AMPLITUDE: 0.75 V
MDC IDC MSMT LEADCHNL RA PACING THRESHOLD PULSEWIDTH: 0.5 ms
MDC IDC PG SERIAL: 7241695
MDC IDC SESS DTM: 20180130103906
MDC IDC SET LEADCHNL RV PACING AMPLITUDE: 2.5 V
MDC IDC STAT BRADY RA PERCENT PACED: 98 %

## 2016-04-16 MED ORDER — FUROSEMIDE 20 MG PO TABS: ORAL_TABLET | ORAL | Status: DC

## 2016-04-16 MED ORDER — CARVEDILOL 12.5 MG PO TABS: 18.7500 mg | ORAL_TABLET | Freq: Two times a day (BID) | ORAL | Status: DC

## 2016-04-16 MED ORDER — FUROSEMIDE 20 MG PO TABS: ORAL_TABLET | ORAL | 2 refills | Status: DC

## 2016-04-16 MED ORDER — CARVEDILOL 12.5 MG PO TABS: 18.7500 mg | ORAL_TABLET | Freq: Two times a day (BID) | ORAL | 3 refills | Status: DC

## 2016-04-16 MED ORDER — FUROSEMIDE 20 MG PO TABS: ORAL_TABLET | ORAL | 0 refills | Status: DC

## 2016-04-16 NOTE — Patient Instructions (Signed)
Medication Instructions: - Your physician has recommended you make the following change in your medication:  1) Increase coreg (carvedilol) 12.5 mg- take 1 & 1/2 tablets (18.75 mg) by mouth twice daily 2) Start lasix (furosemide) 20 mg- take one tablet by mouth daily x 3 days, then take one tablet by mouth Mondays/ Wednesdays/ & Fridays  Labwork: - Your physician recommends that you have lab work: TSH- please take the lab order given to you today when you have your next blood draw at your primary care office.  Procedures/Testing: - Please check with your primary care doctor to see if you have had an echocardiogram done recently- if so, please have them fax results to Dr. Caryl Comes at [phone].  Follow-Up: - Remote monitoring is used to monitor your Pacemaker of ICD from home. This monitoring reduces the number of office visits required to check your device to one time per year. It allows [place] to keep an eye on the functioning of your device to ensure it is working properly. You are scheduled for a device check from home on 07/16/16. You may send your transmission at any time that day. If you have a wireless device, the transmission will be sent automatically. After your physician reviews your transmission, you will receive a postcard with your next transmission date.  - Your physician wants you to follow-up in: 6 months with Dr. Caryl Comes. You will receive a reminder letter in the mail two months in advance. If you don't receive a letter, please call our office to schedule the follow-up appointment.  Any Additional Special Instructions Will Be Listed Below (If Applicable).     If you need a refill on your cardiac medications before your next appointment, please call your pharmacy.

## 2016-04-16 NOTE — Telephone Encounter (Signed)
Patient called and stated that no ultrasound has been done per Dr. Elijio Miles.

## 2016-04-16 NOTE — Progress Notes (Signed)
Patient Care Team: Jodi Marble, MD as PCP - General (Internal Medicine)   HPI  Ethan Vega. is a 71 y.o. male Seen following ICD implantation 1/16 following aborted cardiac arrest. Cardiac evaluation at that time included an echocardiogram demonstrating normal left ventricular function had recovered from the EF of 30-35% range. Catheterization 1/16 demonstrated no significant coronary disease;   We undertook thallium for perfusion defects looking for infiltrative processes  this was abnormal raising the spectre of sarcoid,  Further eval has been unilluminating   CT-highresolution was neg for interstitial/LN   There were small nodules  Eye exam showed apparently no sarcoid  Flecainide challenge was negative ECG  Low Volts R-V1 with RAD with mild RA enlargement SAECG not done as he was in AFib RVR and rate related RBBB  He also has atrial fibrillation; now on amio and apixoban   TERF  Stroke-2, age 73, HTN-1 DM -1 for CHADSVAS score >= 5  Date TSH LFTs PFTs  1/17  6.4 31    8/17  4.85 29    1.18   28      DATE TEST    1/16 Cath EF 30-35%  CA normal   4/16    Echo   EF 45-50 %   5/16 myoview   EF %  prior infarct?? No ischemia        C/o increasing SOB   edema  Past Medical History:  Diagnosis Date  . AICD (automatic cardioverter/defibrillator) present 03/31/14   a. 03/2014 s/p SJM 2411-36C dual chamber AICD (serial Number [id number])- followed by Dr. Caryl Comes  . Arthritis    a. L knee.  . Asthma   . Cancer ([place])    Melanoma resected from scalp  . Cardiac arrest [facility])    a. 03/30/2014 VF arrest in setting of NICM >> CPR/defib in community >> s/p dual chamber AICD  . Coronary artery disease, non-occlusive    a. cath 03/2014 at [facility]: no sig CAD (OM1 30%, CFX 30%), EF 35%  . DM2 (diabetes mellitus, type 2) ([place])    a. Diet controlled.  Marland Kitchen HLD (hyperlipidemia)   . HTN (hypertension)   . Kidney stones   . NICM (nonischemic cardiomyopathy) ([place])    a. 03/2014  Echo:  Inferolateral and lateral HK, EF 99991111, grade 1 diastolic dysfunction, mild MR, mild LAE, mild RAE, trivial TR, no effusion.  . Paroxysmal atrial fibrillation ([place]) 03/28/2015  . Retinal artery occlusion     Past Surgical History:  Procedure Laterality Date  . CARDIAC CATHETERIZATION  03/30/2014  . CARDIOVERSION  03/30/2014  . CYSTOSCOPY W/ LITHOLAPAXY / EHL    . IMPLANTABLE CARDIOVERTER DEFIBRILLATOR IMPLANT N/A 03/31/2014   Procedure: IMPLANTABLE CARDIOVERTER DEFIBRILLATOR IMPLANT;  Surgeon: Evans Lance, MD;  Location: [facility] CATH LAB;  Service: Cardiovascular;  Laterality: N/A;  . JOINT REPLACEMENT    . TONSILLECTOMY     "as a kid"  . TOTAL KNEE ARTHROPLASTY Left ~ 2010  . TOTAL KNEE ARTHROPLASTY WITH REVISION COMPONENTS Left ~ 2011  . VARICOSE VEIN SURGERY Left ~ 2014    Current Outpatient Prescriptions  Medication Sig Dispense Refill  . amiodarone (PACERONE) 200 MG tablet Take one-half tablet by  mouth daily 45 tablet 3  . apixaban (ELIQUIS) 5 MG TABS tablet Take 1 tablet (5 mg total) by mouth 2 (two) times daily. 180 tablet 3  . atorvastatin (LIPITOR) 10 MG tablet Take 10 mg by mouth daily.    Marland Kitchen  budesonide-formoterol (SYMBICORT) 160-4.5 MCG/ACT inhaler Inhale 2 puffs into the lungs daily.    . carvedilol (COREG) 12.5 MG tablet TAKE 1 TABLET BY MOUTH TWO  TIMES DAILY 180 tablet 3  . fexofenadine (ALLEGRA) 180 MG tablet Take 180 mg by mouth daily.    . fluticasone (VERAMYST) 27.5 MCG/SPRAY nasal spray Place 2 sprays into the nose daily.    Marland Kitchen losartan (COZAAR) 100 MG tablet Take 1 tablet (100 mg total) by mouth daily. 90 tablet 3  . Multiple Vitamin (MULTIVITAMIN) capsule Take 1 capsule by mouth daily.    . Omega-3 Fatty Acids (FISH OIL) 1200 MG CAPS Take by mouth daily.     No current facility-administered medications for this visit.     No Known Allergies  Review of Systems negative except from HPI and PMH  Physical Exam BP (!) 142/80 (BP Location: Left Arm, Patient  Position: Sitting, Cuff Size: Large)   Pulse 60   Ht 5\' 6"  (1.676 m)   Wt 226 lb 4 oz (102.6 kg)   BMI 36.52 kg/m  Well developed and well nourished in no acute distress HENT normal E scleral and icterus clear Neck Supple JVP 6 ; carotids brisk and full Clear to ausculation Device pocket well healed; without hematoma or erythema.  There is no tethering Regular rate and rhythm, no murmurs gallops or rub Soft with active bowel sounds No clubbing cyanosis  1-2 +Edema Alert and oriented, grossly normal motor and sensory function Skin Warm and Dry   Atrial pacing at  6  incomplete right bundle branch block with right axis deviation    Assessment and  Plan  Aborted Cardiac arrest//FHx Cardiac Arrest   ICD  St Jude   Hypertension  Sleep disordered breathing  Sinus bradycardia/chronotropic incompetence  Low Voltage ECG  Atrial Fibrillation   Retinal artery occlusion  Dyspnea on Exertion  No intercurrent Ventricular tachycardia  Blood pressure is reasonably controlled  No intercurrent atrial fibrillation or flutter  On Anticoagulation;  No bleeding issues --   Blood pressure a little bit up and with cardiomyopathy will increase carvediolol 12.5>>18.75  Some volume overload  Will begin low dose diuretic   Kidney function ( GFR 85 ) 1/18  Still no explanation for low voltage--amyloid remains in the back of the mind; without evidence of hypertrophy do not need to worry about [place]  Will get echo to look at LV function and have encouraged to get a sleep study   .n

## 2016-04-16 NOTE — Telephone Encounter (Signed)
I called and spoke with the patient. He is aware that per Dr. Caryl Comes, he will need to have a repeat echocardiogram done. He is agreeable. I advised I will have one of the schedulers from [facility] call him to arrange.

## 2016-04-17 NOTE — Telephone Encounter (Signed)
Attempted to schedule.  Patient wants to wait .  Will call us back when ready

## 2016-04-22 ENCOUNTER — Telehealth: Payer: Self-pay | Admitting: Cardiovascular Disease

## 2016-04-22 NOTE — Telephone Encounter (Signed)
Please call patient as he is confused on the directions for taking furosemide (LASIX) 20 MG tablet.

## 2016-04-22 NOTE — Telephone Encounter (Signed)
S/w patient.  Clarified with patient to take furosemide on Mondays/Wednesdays/Fridays according to AVS from oV on 04/16/16: 2) Start lasix (furosemide) 20 mg- take one tablet by mouth daily x 3 days, then take one tablet by mouth Mondays/ Wednesdays/ & Fridays.  Patient verbalized understanding and took the 3 days in a row of furosemide last week. Patient very appreciative.

## 2016-05-08 ENCOUNTER — Other Ambulatory Visit: Payer: Self-pay | Admitting: Cardiovascular Disease

## 2016-05-13 ENCOUNTER — Other Ambulatory Visit: Payer: Self-pay | Admitting: Internal Medicine

## 2016-05-15 ENCOUNTER — Ambulatory Visit
Admission: RE | Admit: 2016-05-15 | Discharge: 2016-05-15 | Disposition: A | Discharge status: Home / Self Care | Payer: Medicare Other | Source: Ambulatory Visit | Attending: Physician Assistant | Admitting: Physician Assistant

## 2016-05-15 ENCOUNTER — Other Ambulatory Visit: Payer: Self-pay

## 2016-05-15 ENCOUNTER — Ambulatory Visit (INDEPENDENT_AMBULATORY_CARE_PROVIDER_SITE_OTHER): Payer: Medicare Other

## 2016-05-15 ENCOUNTER — Other Ambulatory Visit: Payer: Self-pay | Admitting: Physician Assistant

## 2016-05-15 DIAGNOSIS — I428 Other cardiomyopathies: Secondary | ICD-10-CM

## 2016-05-15 DIAGNOSIS — I4891 Unspecified atrial fibrillation: Secondary | ICD-10-CM | POA: Insufficient documentation

## 2016-05-15 LAB — ECHOCARDIOGRAM COMPLETE
Ao-asc: 36 cm
E decel time: 229 msec
EERAT: 14.02
FS: 34 % (ref 28–44)
IVS/LV PW RATIO, ED: 0.77
LA ID, A-P, ES: 45 mm
LA diam index: 2.13 cm/m2
LA vol A4C: 58.4 ml
LA vol index: 30.3 mL/m2
LAVOL: 63.9 mL
LDCA: 3.46 cm2
LEFT ATRIUM END SYS DIAM: 45 mm
LV E/e' medial: 14.02
LV E/e'average: 14.02
LV PW d: 13 mm — AB (ref 0.6–1.1)
LV TDI E'LATERAL: 6.64
LV TDI E'MEDIAL: 6.2
LVELAT: 6.64 cm/s
LVOT VTI: 24.2 cm
LVOT diameter: 21 mm
LVOTSV: 84 mL
Lateral S' vel: 15.6 cm/s
MV Dec: 229
MV pk A vel: 105 m/s
MV pk E vel: 93.1 m/s
MVPG: 3 mmHg
PV Reg vel dias: 85.4 cm/s
RV TAPSE: 29 mm

## 2016-05-15 MED ORDER — PERFLUTREN LIPID MICROSPHERE
1.0000 mL | INTRAVENOUS | Status: AC | PRN
Start: 2016-05-15 — End: 2016-05-15
  Administered 2016-05-15: 2 mL via INTRAVENOUS
  Filled 2016-05-15: qty 10

## 2016-05-15 NOTE — Progress Notes (Signed)
*  PRELIMINARY RESULTS* Echocardiogram 2D Echocardiogram has been performed.  Ethan Vega 05/15/2016, 3:59 PM

## 2016-05-16 NOTE — Significant Event (Signed)
Patient presented for outpatient echo. Images are concerning for apical akinesis and abnormal echos that could represent mural thrombus (though patient is on apixaban). Arrangements have been made for limited echo with [facility] at [facility] to rule out thrombus.  Nelva Bush, MD [facility] Pager: [phone]

## 2016-07-16 ENCOUNTER — Ambulatory Visit (INDEPENDENT_AMBULATORY_CARE_PROVIDER_SITE_OTHER): Payer: Medicare Other | Admitting: *Deleted

## 2016-07-16 DIAGNOSIS — I469 Cardiac arrest, cause unspecified: Secondary | ICD-10-CM

## 2016-07-16 DIAGNOSIS — I428 Other cardiomyopathies: Secondary | ICD-10-CM

## 2016-07-16 NOTE — Progress Notes (Signed)
Remote ICD transmission.   

## 2016-07-17 ENCOUNTER — Encounter: Payer: Self-pay | Admitting: Cardiology

## 2016-07-17 LAB — CUP PACEART REMOTE DEVICE CHECK
Battery Remaining Longevity: 65 mo
Brady Statistic AP VS Percent: 98 %
Brady Statistic AS VP Percent: 1 %
Brady Statistic AS VS Percent: 1 %
Brady Statistic RA Percent Paced: 98 %
Brady Statistic RV Percent Paced: 1.3 %
Date Time Interrogation Session: 20180501080017
HIGH POWER IMPEDANCE MEASURED VALUE: 69 Ohm
HighPow Impedance: 69 Ohm
Implantable Lead Implant Date: 20160114
Implantable Lead Location: 753860
Lead Channel Impedance Value: 380 Ohm
Lead Channel Pacing Threshold Amplitude: 0.625 V
Lead Channel Pacing Threshold Amplitude: 1.25 V
Lead Channel Sensing Intrinsic Amplitude: 2.1 mV
Lead Channel Setting Sensing Sensitivity: 0.5 mV
MDC IDC LEAD IMPLANT DT: 20160114
MDC IDC LEAD LOCATION: 753859
MDC IDC MSMT BATTERY REMAINING PERCENTAGE: 74 %
MDC IDC MSMT BATTERY VOLTAGE: 2.95 V
MDC IDC MSMT LEADCHNL RA PACING THRESHOLD PULSEWIDTH: 0.5 ms
MDC IDC MSMT LEADCHNL RV IMPEDANCE VALUE: 340 Ohm
MDC IDC MSMT LEADCHNL RV PACING THRESHOLD PULSEWIDTH: 0.5 ms
MDC IDC MSMT LEADCHNL RV SENSING INTR AMPL: 8 mV
MDC IDC PG IMPLANT DT: 20160114
MDC IDC SET LEADCHNL RA PACING AMPLITUDE: 1.625
MDC IDC SET LEADCHNL RV PACING AMPLITUDE: 2.5 V
MDC IDC SET LEADCHNL RV PACING PULSEWIDTH: 0.5 ms
MDC IDC STAT BRADY AP VP PERCENT: 1.3 %
Pulse Gen Serial Number: 7241695

## 2016-07-17 NOTE — Progress Notes (Signed)
Letter  

## 2016-08-08 IMAGING — NM MYOCARDIAL PERFUSION
6 series · 36 of 36 positions shown · non-contrast
Comparison: none

[Series 1000: rest thallium 6 hr · 9.59mm/px · 6 of 63 frames shown]
[frame 6/63]
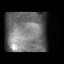
[frame 16/63]
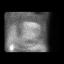
[frame 27/63]
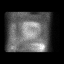
[frame 37/63]
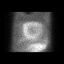
[frame 48/63]
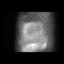
[frame 58/63]
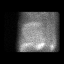

[Series 1000: rest thallium 24 hr_dc · 9.59mm/px · 6 of 64 frames shown]
[frame 6/64]
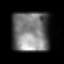
[frame 16/64]
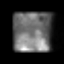
[frame 27/64]
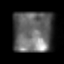
[frame 38/64]
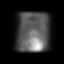
[frame 48/64]
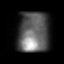
[frame 59/64]
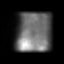

[Series 1000: rest thallium 6 hr_dc · 9.59mm/px · 6 of 64 frames shown]
[frame 6/64]
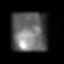
[frame 16/64]
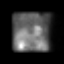
[frame 27/64]
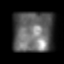
[frame 38/64]
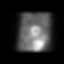
[frame 48/64]
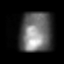
[frame 59/64]
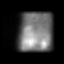

[Series 1000: rest thallium 24 hr (recon - noac ) · 9.6mm · 9.59mm/px · 6 of 22 frames shown]
[frame 2/22]
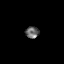
[frame 6/22]
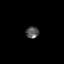
[frame 10/22]
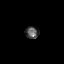
[frame 13/22]
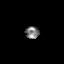
[frame 17/22]
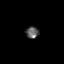
[frame 21/22]
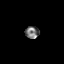

[Series 1000: rest thallium 6 hr (recon - noac ) · 9.6mm · 9.59mm/px · 6 of 22 frames shown]
[frame 2/22]
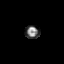
[frame 6/22]
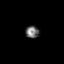
[frame 10/22]
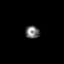
[frame 13/22]
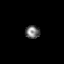
[frame 17/22]
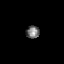
[frame 21/22]
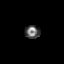

[Series 1000: rest thallium 24 hr · 9.59mm/px · 6 of 58 frames shown]
[frame 5/58]
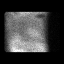
[frame 15/58]
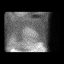
[frame 25/58]
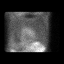
[frame 34/58]
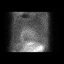
[frame 44/58]
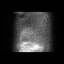
[frame 54/58]
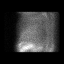

[36 of 36 positions shown; findings below may reference images not displayed]

Canned report from images found in remote index.

Refer to host system for actual result text.

## 2016-09-17 ENCOUNTER — Telehealth: Payer: Self-pay | Admitting: Internal Medicine

## 2016-09-17 NOTE — Telephone Encounter (Signed)
Clearance request for 03/24/2017 colonoscopy with [facility] placed in [place] [facility] inbasket. Pt has July 10 appt w/Dr. Caryl Comes.

## 2016-09-24 ENCOUNTER — Encounter: Payer: Self-pay | Admitting: Internal Medicine

## 2016-09-24 ENCOUNTER — Ambulatory Visit (INDEPENDENT_AMBULATORY_CARE_PROVIDER_SITE_OTHER): Payer: Medicare Other | Admitting: Internal Medicine

## 2016-09-24 VITALS — BP 140/78 | HR 61 | Ht 66.0 in | Wt 235.2 lb

## 2016-09-24 DIAGNOSIS — Z95810 Presence of automatic (implantable) cardiac defibrillator: Secondary | ICD-10-CM

## 2016-09-24 DIAGNOSIS — I469 Cardiac arrest, cause unspecified: Secondary | ICD-10-CM | POA: Diagnosis not present

## 2016-09-24 DIAGNOSIS — I480 Paroxysmal atrial fibrillation: Secondary | ICD-10-CM

## 2016-09-24 DIAGNOSIS — I495 Sick sinus syndrome: Secondary | ICD-10-CM | POA: Diagnosis not present

## 2016-09-24 DIAGNOSIS — Z79899 Other long term (current) drug therapy: Secondary | ICD-10-CM

## 2016-09-24 MED ORDER — FUROSEMIDE 20 MG PO TABS: ORAL_TABLET | ORAL | Status: DC

## 2016-09-24 NOTE — Patient Instructions (Signed)
Medication Instructions: - Your physician has recommended you make the following change in your medication:  1) Take lasix (furosemide) 20 mg- take 2 tablets (40 mg) by mouth once daily on the even days of the month  Labwork: - Your physician recommends that you have lab work with your next lab draw at primary care: TSH (please take the order given to you today when you go for your next draw)  Procedures/Testing: - Your physician has recommended that you have a pulmonary function test. Pulmonary Function Tests are a group of tests that measure how well air moves in and out of your lungs.  Follow-Up: - Remote monitoring is used to monitor your Pacemaker of ICD from home. This monitoring reduces the number of office visits required to check your device to one time per year. It allows [place] to keep an eye on the functioning of your device to ensure it is working properly. You are scheduled for a device check from home on 12/24/16. You may send your transmission at any time that day. If you have a wireless device, the transmission will be sent automatically. After your physician reviews your transmission, you will receive a postcard with your next transmission date.  - Your physician wants you to follow-up in: 6 months with Dr. Caryl Comes. You will receive a reminder letter in the mail two months in advance. If you don't receive a letter, please call our office to schedule the follow-up appointment.   Any Additional Special Instructions Will Be Listed Below (If Applicable).     If you need a refill on your cardiac medications before your next appointment, please call your pharmacy.

## 2016-09-24 NOTE — Progress Notes (Signed)
Patient Care Team: Jodi Marble, MD as PCP - General (Internal Medicine)   HPI  Ethan Vega. is a 71 y.o. male Seen following ICD implantation 1/16 following aborted cardiac arrest. Cardiac evaluation at that time included an echocardiogram demonstrating normal left ventricular function had recovered from the EF of 30-35% range. Catheterization 1/16 demonstrated no significant coronary disease;   We undertook thallium for perfusion defects looking for infiltrative processes  this was abnormal raising the spectre of sarcoid,  Further eval has been unilluminating    CT-highresolution was neg for interstitial/LN   There were small nodules  Eye exam showed apparently no sarcoid  Flecainide challenge was negative ECG  Low Volts R-V1 with RAD with mild RA enlargement SAECG not done as he was in AFib RVR and rate related RBBB  He also has atrial fibrillation; now on amio and apixoban no interval atrial fibrillation. Tolerating the amiodarone without complaints of nausea or sun sensitivity. He has had no major bleeding on apixoban.  He did stop taking his diuretics. He's had problems with some edema. Some sob but no chest pain  TERF  Stroke-2, age 29, HTN-1 DM -1 for CHADSVAS score >= 5  Date TSH LFTs PFTs  1/17  6.4 31    8/17  4.85 29    5/18 - 27    Date Cr Hgb  10/17 0.82 14.7  5/18 0.87 13.2      DATE TEST    1/16 Cath EF 30-35%  CA normal   4/16  Echo   EF 45-50 %   5/16 myoview   EF %  prior infarct?? No ischemia  2/18 ECHO EF 40-45 %    C/o increasing SOB   edema  Past Medical History:  Diagnosis Date  . AICD (automatic cardioverter/defibrillator) present 03/31/14   a. 03/2014 s/p SJM 2411-36C dual chamber AICD (serial Number [id number])- followed by Dr. Caryl Comes  . Arthritis    a. L knee.  . Asthma   . Cancer ([place])    Melanoma resected from scalp  . Cardiac arrest [facility])    a. 03/30/2014 VF arrest in setting of NICM >> CPR/defib in community >> s/p  dual chamber AICD  . Coronary artery disease, non-occlusive    a. cath 03/2014 at [facility]: no sig CAD (OM1 30%, CFX 30%), EF 35%  . DM2 (diabetes mellitus, type 2) ([place])    a. Diet controlled.  Marland Kitchen HLD (hyperlipidemia)   . HTN (hypertension)   . Kidney stones   . NICM (nonischemic cardiomyopathy) ([place])    a. 03/2014 Echo:  Inferolateral and lateral HK, EF 82-50%, grade 1 diastolic dysfunction, mild MR, mild LAE, mild RAE, trivial TR, no effusion.  . Paroxysmal atrial fibrillation ([place]) 03/28/2015  . Retinal artery occlusion     Past Surgical History:  Procedure Laterality Date  . CARDIAC CATHETERIZATION  03/30/2014  . CARDIOVERSION  03/30/2014  . CYSTOSCOPY W/ LITHOLAPAXY / EHL    . IMPLANTABLE CARDIOVERTER DEFIBRILLATOR IMPLANT N/A 03/31/2014   Procedure: IMPLANTABLE CARDIOVERTER DEFIBRILLATOR IMPLANT;  Surgeon: Evans Lance, MD;  Location: [facility] CATH LAB;  Service: Cardiovascular;  Laterality: N/A;  . JOINT REPLACEMENT    . TONSILLECTOMY     "as a kid"  . TOTAL KNEE ARTHROPLASTY Left ~ 2010  . TOTAL KNEE ARTHROPLASTY WITH REVISION COMPONENTS Left ~ 2011  . VARICOSE VEIN SURGERY Left ~ 2014    Current Outpatient Prescriptions  Medication Sig Dispense Refill  . amiodarone (  PACERONE) 200 MG tablet TAKE ONE-HALF TABLET BY  MOUTH DAILY 45 tablet 3  . apixaban (ELIQUIS) 5 MG TABS tablet Take 1 tablet (5 mg total) by mouth 2 (two) times daily. 180 tablet 3  . atorvastatin (LIPITOR) 10 MG tablet Take 10 mg by mouth daily.    . budesonide-formoterol (SYMBICORT) 160-4.5 MCG/ACT inhaler Inhale 2 puffs into the lungs daily.    . carvedilol (COREG) 12.5 MG tablet Take 1.5 tablets (18.75 mg total) by mouth 2 (two) times daily. 270 tablet 3  . fexofenadine (ALLEGRA) 180 MG tablet Take 180 mg by mouth daily.    Marland Kitchen losartan (COZAAR) 100 MG tablet TAKE 1 TABLET BY MOUTH  DAILY 90 tablet 3  . Multiple Vitamin (MULTIVITAMIN) capsule Take 1 capsule by mouth daily.    . Omega-3 Fatty Acids (FISH OIL) 1200 MG  CAPS Take by mouth daily.     No current facility-administered medications for this visit.     No Known Allergies  Review of Systems negative except from HPI and PMH  Physical Exam BP 140/78 (BP Location: Left Arm, Patient Position: Sitting, Cuff Size: Large)   Pulse 61   Ht 5\' 6"  (1.676 m)   Wt 235 lb 4 oz (106.7 kg)   BMI 37.97 kg/m  Well developed and well nourished in no acute distress HENT normal E scleral and icterus clear Neck Supple JVP 6 ; carotids brisk and full Clear to ausculation Device pocket well healed; without hematoma or erythema.  There is no tethering Regular rate and rhythm, no murmurs gallops or rub Soft with active bowel sounds No clubbing cyanosis  1-2 +Edema Alert and oriented, grossly normal motor and sensory function Skin Warm and Dry   Atrial pacing at 61 with intrinsic conduction 24/10/41  Assessment and  Plan  Aborted Cardiac arrest//FHx Cardiac Arrest   ICD  St Jude   Hypertension  Sinus bradycardia/chronotropic incompetence  Low Voltage ECG  Atrial Fibrillation   Retinal artery occlusion  Congestive heart failure-chronic-systolic/diastolic  On Anticoagulation;  No bleeding issues   willd volume overload  we'll have him resume his diuretics 40 mg every other day  Amiodarone surveillance labs lactic TSH. We'll have him get this from his primary care physician  We will arrange pulmonary function testing.  No intercurrent Ventricular tachycardia  No intercurrent atrial fibrillation or flutter

## 2016-09-25 ENCOUNTER — Other Ambulatory Visit: Payer: Self-pay

## 2016-09-25 ENCOUNTER — Telehealth: Payer: Self-pay | Admitting: Internal Medicine

## 2016-09-25 MED ORDER — FUROSEMIDE 20 MG PO TABS: ORAL_TABLET | ORAL | Status: DC

## 2016-09-25 NOTE — Telephone Encounter (Signed)
I called and spoke with the patient. Advised him that his Pulmonary Function Test has been scheduled for Thursday 10/17/16 at 10:30 am. He is aware to arrive at the [facility] entrance at 10:15 am. Letter of instructions given to the patient yesterday. He verbalizes understanding.  Patient also aware that Eliquis 5 mg samples have been pulled for him to pick up at the front desk.  Eliquis 5 mg Lot: [id number] Exp: 10/20 # 3 boxes given

## 2016-09-25 NOTE — Telephone Encounter (Deleted)
Requested Prescriptions   Signed Prescriptions Disp Refills  . furosemide (LASIX) 20 MG tablet 60 tablet     Sig: Take 2 tablets (40 mg) by mouth daily on the even days days of the month    Authorizing Provider: Deboraha Sprang    Ordering User: Janan Ridge

## 2016-09-26 ENCOUNTER — Other Ambulatory Visit: Payer: Self-pay | Admitting: *Deleted

## 2016-09-26 MED ORDER — FUROSEMIDE 20 MG PO TABS: ORAL_TABLET | ORAL | 3 refills | Status: DC

## 2016-10-01 LAB — CUP PACEART INCLINIC DEVICE CHECK
Battery Remaining Longevity: 67 mo
Brady Statistic RA Percent Paced: 98 %
Brady Statistic RV Percent Paced: 1.2 %
HIGH POWER IMPEDANCE MEASURED VALUE: 64.125
Implantable Lead Implant Date: 20160114
Implantable Lead Model: 7122
Lead Channel Impedance Value: 350 Ohm
Lead Channel Impedance Value: 400 Ohm
Lead Channel Pacing Threshold Pulse Width: 0.5 ms
Lead Channel Pacing Threshold Pulse Width: 0.5 ms
Lead Channel Sensing Intrinsic Amplitude: 2.1 mV
Lead Channel Setting Pacing Amplitude: 1.625
MDC IDC LEAD IMPLANT DT: 20160114
MDC IDC LEAD LOCATION: 753859
MDC IDC LEAD LOCATION: 753860
MDC IDC MSMT LEADCHNL RA PACING THRESHOLD AMPLITUDE: 0.75 V
MDC IDC MSMT LEADCHNL RV PACING THRESHOLD AMPLITUDE: 1 V
MDC IDC MSMT LEADCHNL RV SENSING INTR AMPL: 11.7 mV
MDC IDC PG IMPLANT DT: 20160114
MDC IDC PG SERIAL: 7241695
MDC IDC SESS DTM: 20180710123813
MDC IDC SET LEADCHNL RV PACING AMPLITUDE: 2.5 V
MDC IDC SET LEADCHNL RV PACING PULSEWIDTH: 0.5 ms
MDC IDC SET LEADCHNL RV SENSING SENSITIVITY: 0.5 mV

## 2016-10-15 ENCOUNTER — Ambulatory Visit (INDEPENDENT_AMBULATORY_CARE_PROVIDER_SITE_OTHER): Payer: Medicare Other | Admitting: *Deleted

## 2016-10-15 DIAGNOSIS — I428 Other cardiomyopathies: Secondary | ICD-10-CM

## 2016-10-15 NOTE — Progress Notes (Signed)
Remote ICD transmission.   

## 2016-10-17 ENCOUNTER — Ambulatory Visit: Payer: Medicare Other | Attending: Internal Medicine

## 2016-10-17 DIAGNOSIS — J449 Chronic obstructive pulmonary disease, unspecified: Secondary | ICD-10-CM | POA: Insufficient documentation

## 2016-10-17 DIAGNOSIS — I480 Paroxysmal atrial fibrillation: Secondary | ICD-10-CM | POA: Diagnosis present

## 2016-10-17 DIAGNOSIS — Z79899 Other long term (current) drug therapy: Secondary | ICD-10-CM | POA: Diagnosis present

## 2016-10-17 MED ORDER — ALBUTEROL SULFATE (2.5 MG/3ML) 0.083% IN NEBU
2.5000 mg | INHALATION_SOLUTION | Freq: Once | RESPIRATORY_TRACT | Status: AC
Start: 2016-10-17 — End: 2016-10-17
  Administered 2016-10-17: 2.5 mg via RESPIRATORY_TRACT
  Filled 2016-10-17: qty 3

## 2016-10-18 ENCOUNTER — Encounter: Payer: Self-pay | Admitting: Cardiology

## 2016-10-22 ENCOUNTER — Other Ambulatory Visit (HOSPITAL_COMMUNITY): Payer: Self-pay | Admitting: Gastroenterology

## 2016-10-22 ENCOUNTER — Other Ambulatory Visit: Payer: Self-pay | Admitting: Gastroenterology

## 2016-10-22 DIAGNOSIS — R7401 Elevation of levels of liver transaminase levels: Secondary | ICD-10-CM

## 2016-10-22 DIAGNOSIS — R740 Nonspecific elevation of levels of transaminase and lactic acid dehydrogenase [LDH]: Principal | ICD-10-CM

## 2016-10-28 ENCOUNTER — Telehealth: Payer: Self-pay | Admitting: Internal Medicine

## 2016-10-28 ENCOUNTER — Telehealth: Payer: Self-pay

## 2016-10-28 NOTE — Telephone Encounter (Signed)
Patient came to office to talk about labs and testing that gi clinic has ordered .    Patient wants to talk to someone here about this before he does it .  He states he trusts Dr. Caryl Comes but isn't familiar with  Algis Liming np at [facility] clinic ordered U/s and Liver testing   Please call.

## 2016-10-28 NOTE — Telephone Encounter (Signed)
See additional telephone note from today where these concerns were addressed.

## 2016-10-28 NOTE — Telephone Encounter (Signed)
Pt is aware and agreeable to normal results in regards to amio. Patient was very confused as to what pulmonary function test was and what COPD was so I had to educate him. Informed patient that Dr. Caryl Comes would recommend him to see Pulmonology for moderate COPD and patient requested [facility] as he already sees GI and ENDO there. I called and got patient scheduled 11/11/16 at 9:15 with Pulmonology at [facility]. He was very grateful for my efforts and my time. I will fax over PFT results and last OV Note for records. We are both agreeable to plan.

## 2016-10-31 ENCOUNTER — Ambulatory Visit
Admission: RE | Admit: 2016-10-31 | Discharge: 2016-10-31 | Disposition: A | Discharge status: Home / Self Care | Payer: Medicare Other | Source: Ambulatory Visit | Attending: Gastroenterology | Admitting: Gastroenterology

## 2016-10-31 DIAGNOSIS — I77811 Abdominal aortic ectasia: Secondary | ICD-10-CM | POA: Diagnosis not present

## 2016-10-31 DIAGNOSIS — R740 Nonspecific elevation of levels of transaminase and lactic acid dehydrogenase [LDH]: Secondary | ICD-10-CM | POA: Diagnosis not present

## 2016-10-31 DIAGNOSIS — N289 Disorder of kidney and ureter, unspecified: Secondary | ICD-10-CM | POA: Insufficient documentation

## 2016-10-31 DIAGNOSIS — R7401 Elevation of levels of liver transaminase levels: Secondary | ICD-10-CM

## 2016-11-05 ENCOUNTER — Other Ambulatory Visit: Payer: Self-pay | Admitting: *Deleted

## 2016-11-05 ENCOUNTER — Telehealth: Payer: Self-pay | Admitting: Internal Medicine

## 2016-11-05 ENCOUNTER — Other Ambulatory Visit
Admission: RE | Admit: 2016-11-05 | Discharge: 2016-11-05 | Disposition: A | Discharge status: Home / Self Care | Payer: Medicare Other | Source: Ambulatory Visit | Attending: Internal Medicine | Admitting: Internal Medicine

## 2016-11-05 DIAGNOSIS — Z79899 Other long term (current) drug therapy: Secondary | ICD-10-CM

## 2016-11-05 DIAGNOSIS — I10 Essential (primary) hypertension: Secondary | ICD-10-CM | POA: Diagnosis present

## 2016-11-05 LAB — TSH: TSH: 5.585 u[IU]/mL — AB (ref 0.350–4.500)

## 2016-11-05 NOTE — Telephone Encounter (Signed)
Returned call to patient. He was going to have TSH from Dr Caryl Comes and some other lab work from his GI doctor drawn at his PCP. However, they are unable to do it at the PCP. Patient calling to see about alternative way to have labs. Patient at [facility] and is having some labs drawn. He has the "external" lab order from his last office visit with Dr. Caryl Comes. He will see if they will accept that order. If so, he will have lab work there. If not, he will call back so that order may be entered for the [place]. Patient was very appreciative and verbalized understanding.

## 2016-11-05 NOTE — Telephone Encounter (Signed)
Cannot draw lab at PCP  Would like lab to be drawn [facility] Please call

## 2016-11-15 ENCOUNTER — Other Ambulatory Visit: Payer: Self-pay | Admitting: *Deleted

## 2016-11-15 DIAGNOSIS — E039 Hypothyroidism, unspecified: Secondary | ICD-10-CM

## 2016-11-20 LAB — CUP PACEART REMOTE DEVICE CHECK
Battery Remaining Longevity: 62 mo
Battery Voltage: 2.95 V
Brady Statistic RA Percent Paced: 97 %
Brady Statistic RV Percent Paced: 1.5 %
HIGH POWER IMPEDANCE MEASURED VALUE: 72 Ohm
HighPow Impedance: 72 Ohm
Implantable Lead Implant Date: 20160114
Implantable Lead Location: 753859
Implantable Lead Model: 7122
Lead Channel Impedance Value: 400 Ohm
Lead Channel Pacing Threshold Amplitude: 1 V
Lead Channel Pacing Threshold Pulse Width: 0.5 ms
Lead Channel Sensing Intrinsic Amplitude: 1.3 mV
Lead Channel Sensing Intrinsic Amplitude: 8.6 mV
Lead Channel Setting Pacing Amplitude: 1.75 V
Lead Channel Setting Pacing Amplitude: 2.5 V
Lead Channel Setting Pacing Pulse Width: 0.5 ms
MDC IDC LEAD IMPLANT DT: 20160114
MDC IDC LEAD LOCATION: 753860
MDC IDC MSMT BATTERY REMAINING PERCENTAGE: 71 %
MDC IDC MSMT LEADCHNL RA PACING THRESHOLD AMPLITUDE: 0.75 V
MDC IDC MSMT LEADCHNL RA PACING THRESHOLD PULSEWIDTH: 0.5 ms
MDC IDC MSMT LEADCHNL RV IMPEDANCE VALUE: 350 Ohm
MDC IDC PG IMPLANT DT: 20160114
MDC IDC SESS DTM: 20180731133635
MDC IDC SET LEADCHNL RV SENSING SENSITIVITY: 0.5 mV
MDC IDC STAT BRADY AP VP PERCENT: 1.5 %
MDC IDC STAT BRADY AP VS PERCENT: 98 %
MDC IDC STAT BRADY AS VP PERCENT: 1 %
MDC IDC STAT BRADY AS VS PERCENT: 1 %
Pulse Gen Serial Number: 7241695

## 2016-11-21 ENCOUNTER — Telehealth: Payer: Self-pay | Admitting: Internal Medicine

## 2016-11-21 ENCOUNTER — Other Ambulatory Visit
Admission: RE | Admit: 2016-11-21 | Discharge: 2016-11-21 | Disposition: A | Discharge status: Home / Self Care | Payer: Medicare Other | Source: Ambulatory Visit | Attending: Internal Medicine | Admitting: Internal Medicine

## 2016-11-21 DIAGNOSIS — E039 Hypothyroidism, unspecified: Secondary | ICD-10-CM

## 2016-11-21 DIAGNOSIS — Z79899 Other long term (current) drug therapy: Secondary | ICD-10-CM

## 2016-11-21 DIAGNOSIS — I480 Paroxysmal atrial fibrillation: Secondary | ICD-10-CM | POA: Diagnosis present

## 2016-11-21 LAB — T4, FREE: Free T4: 0.74 ng/dL (ref 0.61–1.12)

## 2016-11-21 LAB — TSH: TSH: 6.811 u[IU]/mL — ABNORMAL HIGH (ref 0.350–4.500)

## 2016-11-21 NOTE — Telephone Encounter (Signed)
Patient came to office to let us know he had an Abdominal CT at [facility] , an U/S at [facility] imaging and also some breathing tests with Dr Raul Del at [facility] (? Chest Xray)   Please call patient to discuss if Dr. Caryl Comes needs these results.

## 2016-11-21 NOTE — Telephone Encounter (Signed)
Will forward to Dr. Caryl Comes to review. We had him do PFT's on 10/18/16 and Dr. Caryl Comes recommended pulmonary follow up at that time. The patient chose to go to [facility] pulmonary.

## 2016-11-22 LAB — T3, FREE: T3 FREE: 2.8 pg/mL (ref 2.0–4.4)

## 2016-11-27 ENCOUNTER — Encounter: Payer: Self-pay | Admitting: Internal Medicine

## 2016-11-27 NOTE — Progress Notes (Signed)
With high TSH and normal T3 and T4, discussed with Dr. Carrolyn Meiers, recommended low-dose thyroid replacement for subclinical hypothyroidism.Ethan Vega begin 25 g.

## 2016-11-28 ENCOUNTER — Telehealth: Payer: Self-pay | Admitting: Internal Medicine

## 2016-11-28 MED ORDER — LEVOTHYROXINE SODIUM 25 MCG PO TABS: 25.0000 ug | ORAL_TABLET | Freq: Every day | ORAL | 6 refills | Status: DC

## 2016-11-28 NOTE — Telephone Encounter (Signed)
I spoke with the patient- he is aware of his thyroid lab results.  He will begin synthroid 25 mcg once daily as recommended by Dr. Caryl Comes.  I advised I will forward a copy of his results to his PCP as well.  He voices understanding.

## 2016-12-03 ENCOUNTER — Other Ambulatory Visit: Payer: Self-pay | Admitting: *Deleted

## 2016-12-03 MED ORDER — FUROSEMIDE 20 MG PO TABS: ORAL_TABLET | ORAL | 3 refills | Status: DC

## 2017-01-06 ENCOUNTER — Other Ambulatory Visit: Payer: Self-pay | Admitting: Gastroenterology

## 2017-01-06 DIAGNOSIS — K760 Fatty (change of) liver, not elsewhere classified: Secondary | ICD-10-CM

## 2017-01-14 ENCOUNTER — Ambulatory Visit (INDEPENDENT_AMBULATORY_CARE_PROVIDER_SITE_OTHER): Payer: Medicare Other | Admitting: *Deleted

## 2017-01-14 DIAGNOSIS — I428 Other cardiomyopathies: Secondary | ICD-10-CM

## 2017-01-14 NOTE — Progress Notes (Signed)
Remote ICD transmission.   

## 2017-01-22 ENCOUNTER — Encounter: Payer: Self-pay | Admitting: Cardiology

## 2017-02-03 LAB — CUP PACEART REMOTE DEVICE CHECK
Battery Remaining Longevity: 61 mo
Battery Remaining Percentage: 69 %
Battery Voltage: 2.95 V
Brady Statistic AP VP Percent: 1.4 %
Brady Statistic AS VP Percent: 1 %
Brady Statistic RA Percent Paced: 98 %
Brady Statistic RV Percent Paced: 1.4 %
Date Time Interrogation Session: 20181030080017
HIGH POWER IMPEDANCE MEASURED VALUE: 78 Ohm
HighPow Impedance: 78 Ohm
Implantable Pulse Generator Implant Date: 20160114
Lead Channel Impedance Value: 360 Ohm
Lead Channel Pacing Threshold Amplitude: 0.75 V
Lead Channel Pacing Threshold Amplitude: 1 V
Lead Channel Pacing Threshold Pulse Width: 0.5 ms
Lead Channel Sensing Intrinsic Amplitude: 8.7 mV
Lead Channel Setting Pacing Amplitude: 1.75 V
Lead Channel Setting Pacing Amplitude: 2.5 V
Lead Channel Setting Pacing Pulse Width: 0.5 ms
MDC IDC LEAD IMPLANT DT: 20160114
MDC IDC LEAD IMPLANT DT: 20160114
MDC IDC LEAD LOCATION: 753859
MDC IDC LEAD LOCATION: 753860
MDC IDC MSMT LEADCHNL RA IMPEDANCE VALUE: 410 Ohm
MDC IDC MSMT LEADCHNL RA PACING THRESHOLD PULSEWIDTH: 0.5 ms
MDC IDC MSMT LEADCHNL RA SENSING INTR AMPL: 2.8 mV
MDC IDC PG SERIAL: 7241695
MDC IDC SET LEADCHNL RV SENSING SENSITIVITY: 0.5 mV
MDC IDC STAT BRADY AP VS PERCENT: 98 %
MDC IDC STAT BRADY AS VS PERCENT: 1 %

## 2017-02-16 ENCOUNTER — Encounter (HOSPITAL_COMMUNITY): Payer: Self-pay | Admitting: Nurse Practitioner

## 2017-02-16 ENCOUNTER — Other Ambulatory Visit: Payer: Self-pay

## 2017-02-16 ENCOUNTER — Emergency Department (HOSPITAL_COMMUNITY): Payer: Medicare Other

## 2017-02-16 ENCOUNTER — Other Ambulatory Visit: Payer: Self-pay | Admitting: Internal Medicine

## 2017-02-16 ENCOUNTER — Observation Stay (HOSPITAL_COMMUNITY)
Admission: EM | Admit: 2017-02-16 | Discharge: 2017-02-17 | Disposition: A | Discharge status: Home / Self Care | Payer: Medicare Other | Attending: Internal Medicine | Admitting: Internal Medicine

## 2017-02-16 DIAGNOSIS — I2510 Atherosclerotic heart disease of native coronary artery without angina pectoris: Secondary | ICD-10-CM | POA: Diagnosis not present

## 2017-02-16 DIAGNOSIS — I5022 Chronic systolic (congestive) heart failure: Secondary | ICD-10-CM

## 2017-02-16 DIAGNOSIS — Z7901 Long term (current) use of anticoagulants: Secondary | ICD-10-CM | POA: Diagnosis not present

## 2017-02-16 DIAGNOSIS — I480 Paroxysmal atrial fibrillation: Secondary | ICD-10-CM | POA: Insufficient documentation

## 2017-02-16 DIAGNOSIS — Z8674 Personal history of sudden cardiac arrest: Secondary | ICD-10-CM | POA: Diagnosis not present

## 2017-02-16 DIAGNOSIS — Z7722 Contact with and (suspected) exposure to environmental tobacco smoke (acute) (chronic): Secondary | ICD-10-CM | POA: Diagnosis not present

## 2017-02-16 DIAGNOSIS — I472 Ventricular tachycardia, unspecified: Secondary | ICD-10-CM

## 2017-02-16 DIAGNOSIS — Z79899 Other long term (current) drug therapy: Secondary | ICD-10-CM | POA: Diagnosis not present

## 2017-02-16 DIAGNOSIS — Z95810 Presence of automatic (implantable) cardiac defibrillator: Secondary | ICD-10-CM | POA: Insufficient documentation

## 2017-02-16 DIAGNOSIS — Z87442 Personal history of urinary calculi: Secondary | ICD-10-CM | POA: Diagnosis not present

## 2017-02-16 DIAGNOSIS — Z85820 Personal history of malignant melanoma of skin: Secondary | ICD-10-CM | POA: Insufficient documentation

## 2017-02-16 DIAGNOSIS — J45909 Unspecified asthma, uncomplicated: Secondary | ICD-10-CM | POA: Insufficient documentation

## 2017-02-16 DIAGNOSIS — E785 Hyperlipidemia, unspecified: Secondary | ICD-10-CM | POA: Insufficient documentation

## 2017-02-16 DIAGNOSIS — R001 Bradycardia, unspecified: Secondary | ICD-10-CM | POA: Insufficient documentation

## 2017-02-16 DIAGNOSIS — I10 Essential (primary) hypertension: Secondary | ICD-10-CM | POA: Insufficient documentation

## 2017-02-16 DIAGNOSIS — R55 Syncope and collapse: Secondary | ICD-10-CM | POA: Insufficient documentation

## 2017-02-16 DIAGNOSIS — E119 Type 2 diabetes mellitus without complications: Secondary | ICD-10-CM | POA: Diagnosis not present

## 2017-02-16 DIAGNOSIS — I4729 Other ventricular tachycardia: Secondary | ICD-10-CM

## 2017-02-16 LAB — PROTIME-INR
INR: 1.35
PROTHROMBIN TIME: 16.5 s — AB (ref 11.4–15.2)

## 2017-02-16 LAB — CBC WITH DIFFERENTIAL/PLATELET
Basophils Absolute: 0 10*3/uL (ref 0.0–0.1)
Basophils Relative: 1 %
EOS ABS: 0.1 10*3/uL (ref 0.0–0.7)
EOS PCT: 2 %
HCT: 43.2 % (ref 39.0–52.0)
Hemoglobin: 14.3 g/dL (ref 13.0–17.0)
LYMPHS ABS: 1.1 10*3/uL (ref 0.7–4.0)
LYMPHS PCT: 18 %
MCH: 32.6 pg (ref 26.0–34.0)
MCHC: 33.1 g/dL (ref 30.0–36.0)
MCV: 98.6 fL (ref 78.0–100.0)
MONO ABS: 0.2 10*3/uL (ref 0.1–1.0)
Monocytes Relative: 4 %
Neutro Abs: 4.7 10*3/uL (ref 1.7–7.7)
Neutrophils Relative %: 75 %
PLATELETS: 212 10*3/uL (ref 150–400)
RBC: 4.38 MIL/uL (ref 4.22–5.81)
RDW: 14 % (ref 11.5–15.5)
WBC: 6.2 10*3/uL (ref 4.0–10.5)

## 2017-02-16 LAB — COMPREHENSIVE METABOLIC PANEL
ALT: 42 U/L (ref 17–63)
ANION GAP: 12 (ref 5–15)
AST: 66 U/L — ABNORMAL HIGH (ref 15–41)
Albumin: 3.5 g/dL (ref 3.5–5.0)
Alkaline Phosphatase: 75 U/L (ref 38–126)
BUN: 16 mg/dL (ref 6–20)
CHLORIDE: 107 mmol/L (ref 101–111)
CO2: 21 mmol/L — AB (ref 22–32)
CREATININE: 1.71 mg/dL — AB (ref 0.61–1.24)
Calcium: 9 mg/dL (ref 8.9–10.3)
GFR, EST AFRICAN AMERICAN: 44 mL/min — AB (ref >60)
GFR, EST NON AFRICAN AMERICAN: 38 mL/min — AB (ref >60)
Glucose, Bld: 201 mg/dL — ABNORMAL HIGH (ref 65–99)
POTASSIUM: 4.5 mmol/L (ref 3.5–5.1)
SODIUM: 140 mmol/L (ref 135–145)
Total Bilirubin: 2.9 mg/dL — ABNORMAL HIGH (ref 0.3–1.2)
Total Protein: 6.2 g/dL — ABNORMAL LOW (ref 6.5–8.1)

## 2017-02-16 LAB — I-STAT TROPONIN, ED: TROPONIN I, POC: 0.2 ng/mL — AB (ref 0.00–0.08)

## 2017-02-16 MED ORDER — LEVOTHYROXINE SODIUM 25 MCG PO TABS
25.0000 ug | ORAL_TABLET | Freq: Every day | ORAL | Status: DC
  Administered 2017-02-17: 25 ug via ORAL
  Filled 2017-02-16: qty 1

## 2017-02-16 MED ORDER — GUAIFENESIN-DM 100-10 MG/5ML PO SYRP
5.0000 mL | ORAL_SOLUTION | ORAL | Status: DC | PRN
  Administered 2017-02-16: 5 mL via ORAL
  Filled 2017-02-16: qty 5

## 2017-02-16 MED ORDER — CARVEDILOL 6.25 MG PO TABS
18.7500 mg | ORAL_TABLET | Freq: Two times a day (BID) | ORAL | Status: DC
  Administered 2017-02-16 – 2017-02-17 (×2): 18.75 mg via ORAL
  Filled 2017-02-16: qty 2
  Filled 2017-02-16 (×2): qty 1

## 2017-02-16 MED ORDER — LOSARTAN POTASSIUM 50 MG PO TABS
100.0000 mg | ORAL_TABLET | Freq: Every day | ORAL | Status: DC
Start: 2017-02-16 — End: 2017-02-17
  Administered 2017-02-16 – 2017-02-17 (×2): 100 mg via ORAL
  Filled 2017-02-16 (×2): qty 2

## 2017-02-16 MED ORDER — ACETAMINOPHEN 325 MG PO TABS: 650.0000 mg | ORAL_TABLET | ORAL | Status: DC | PRN

## 2017-02-16 MED ORDER — FUROSEMIDE 40 MG PO TABS
40.0000 mg | ORAL_TABLET | Freq: Every day | ORAL | Status: DC
  Administered 2017-02-16 – 2017-02-17 (×2): 40 mg via ORAL
  Filled 2017-02-16: qty 1
  Filled 2017-02-16: qty 2

## 2017-02-16 MED ORDER — MULTIVITAMINS PO CAPS: 1.0000 | ORAL_CAPSULE | Freq: Every day | ORAL | Status: DC

## 2017-02-16 MED ORDER — SODIUM CHLORIDE 0.9 % IV BOLUS (SEPSIS)
500.0000 mL | Freq: Once | INTRAVENOUS | Status: AC
  Administered 2017-02-16: 500 mL via INTRAVENOUS

## 2017-02-16 MED ORDER — ADULT MULTIVITAMIN W/MINERALS CH
1.0000 | ORAL_TABLET | Freq: Every day | ORAL | Status: DC
Start: 2017-02-16 — End: 2017-02-17
  Administered 2017-02-16 – 2017-02-17 (×2): 1 via ORAL
  Filled 2017-02-16 (×2): qty 1

## 2017-02-16 MED ORDER — AMIODARONE HCL 100 MG PO TABS
100.0000 mg | ORAL_TABLET | Freq: Every day | ORAL | Status: DC
  Administered 2017-02-16 – 2017-02-17 (×2): 100 mg via ORAL
  Filled 2017-02-16 (×2): qty 1

## 2017-02-16 MED ORDER — APIXABAN 5 MG PO TABS
5.0000 mg | ORAL_TABLET | Freq: Two times a day (BID) | ORAL | Status: DC
  Administered 2017-02-16 – 2017-02-17 (×3): 5 mg via ORAL
  Filled 2017-02-16 (×3): qty 1

## 2017-02-16 MED ORDER — ATORVASTATIN CALCIUM 10 MG PO TABS
10.0000 mg | ORAL_TABLET | Freq: Every day | ORAL | Status: DC
  Administered 2017-02-16 – 2017-02-17 (×2): 10 mg via ORAL
  Filled 2017-02-16 (×2): qty 1

## 2017-02-16 NOTE — ED Triage Notes (Signed)
Per EMS- pt woke up covered in urine, got up and felt nauseated & called EMS. Pt was in [facility]. Given amiodorone X 2,  Pt became unresponsive and was shocked by EMS, converted back into normal rhythm. Pt has a pacemaker/defib that is not working. Pt Hr currently 64. Pt on [place]. 20G PIV to R hand placed by EMS. 20 G PIV placed to L hand.

## 2017-02-16 NOTE — ED Notes (Signed)
Cardiology assessing the patient.

## 2017-02-16 NOTE — H&P (Signed)
Ethan Vega. is an 71 y.o. male.    Chief Complaint: "I passed out" HPI: The patient is a 71 year old man with a long-standing nonischemic cardiomyopathy, as well as coronary disease, with a history of hemodynamically unstable ventricular tachycardia over 2 years ago status post [employer] dual-chamber ICD. He has done well in the interim with no syncope, class II heart failure symptoms, and no anginal symptoms. He was in his usual state of health earlier this morning when he was watching television and passed out. On awakening he felt weak and lightheaded and had urinated on himself. He called the paramedics and was defibrillated in the field out of ventricular tachycardia at 180 bpm. He currently feels well with no chest pain or shortness of breath. Prior to the episode, he has not had anginal symptoms and his heart failure is no worse if not better. He denies prior syncope since his device was placed.  Past Medical History:  Diagnosis Date  . AICD (automatic cardioverter/defibrillator) present 03/31/14   a. 03/2014 s/p SJM 2411-36C dual chamber AICD (serial Number [id number])- followed by Dr. Caryl Comes  . Arthritis    a. L knee.  . Asthma   . Cancer ([place])    Melanoma resected from scalp  . Cardiac arrest [facility])    a. 03/30/2014 VF arrest in setting of NICM >> CPR/defib in community >> s/p dual chamber AICD  . Coronary artery disease, non-occlusive    a. cath 03/2014 at [facility]: no sig CAD (OM1 30%, CFX 30%), EF 35%  . DM2 (diabetes mellitus, type 2) ([place])    a. Diet controlled.  Marland Kitchen HLD (hyperlipidemia)   . HTN (hypertension)   . Kidney stones   . NICM (nonischemic cardiomyopathy) ([place])    a. 03/2014 Echo:  Inferolateral and lateral HK, EF 32-44%, grade 1 diastolic dysfunction, mild MR, mild LAE, mild RAE, trivial TR, no effusion.  . Paroxysmal atrial fibrillation ([place]) 03/28/2015  . Retinal artery occlusion     Past Surgical History:  Procedure Laterality Date  . CARDIAC CATHETERIZATION   03/30/2014  . CARDIOVERSION  03/30/2014  . CYSTOSCOPY W/ LITHOLAPAXY / EHL    . IMPLANTABLE CARDIOVERTER DEFIBRILLATOR IMPLANT N/A 03/31/2014   Procedure: IMPLANTABLE CARDIOVERTER DEFIBRILLATOR IMPLANT;  Surgeon: Evans Lance, MD;  Location: [facility] CATH LAB;  Service: Cardiovascular;  Laterality: N/A;  . JOINT REPLACEMENT    . TONSILLECTOMY     "as a kid"  . TOTAL KNEE ARTHROPLASTY Left ~ 2010  . TOTAL KNEE ARTHROPLASTY WITH REVISION COMPONENTS Left ~ 2011  . VARICOSE VEIN SURGERY Left ~ 2014    Family History  Problem Relation Age of Onset  . Esophageal cancer Mother        died at 12  . Heart attack Father        died at 76  . Hypertension Brother    Social History:  reports that he is a non-smoker but has been exposed to tobacco smoke. he has never used smokeless tobacco. He reports that he does not drink alcohol or use drugs.  Allergies: No Known Allergies   (Not in a hospital admission)  Results for orders placed or performed during the hospital encounter of 02/16/17 (from the past 48 hour(s))  CBC with Differential/Platelet     Status: None   Collection Time: 02/16/17  8:50 AM  Result Value Ref Range   WBC 6.2 4.0 - 10.5 K/uL   RBC 4.38 4.22 - 5.81 MIL/uL   Hemoglobin 14.3 13.0 -  17.0 g/dL   HCT 43.2 39.0 - 52.0 %   MCV 98.6 78.0 - 100.0 fL   MCH 32.6 26.0 - 34.0 pg   MCHC 33.1 30.0 - 36.0 g/dL   RDW 14.0 11.5 - 15.5 %   Platelets 212 150 - 400 K/uL   Neutrophils Relative % 75 %   Neutro Abs 4.7 1.7 - 7.7 K/uL   Lymphocytes Relative 18 %   Lymphs Abs 1.1 0.7 - 4.0 K/uL   Monocytes Relative 4 %   Monocytes Absolute 0.2 0.1 - 1.0 K/uL   Eosinophils Relative 2 %   Eosinophils Absolute 0.1 0.0 - 0.7 K/uL   Basophils Relative 1 %   Basophils Absolute 0.0 0.0 - 0.1 K/uL  Comprehensive metabolic panel     Status: Abnormal   Collection Time: 02/16/17  8:50 AM  Result Value Ref Range   Sodium 140 135 - 145 mmol/L   Potassium 4.5 3.5 - 5.1 mmol/L   Chloride 107 101 -  111 mmol/L   CO2 21 (L) 22 - 32 mmol/L   Glucose, Bld 201 (H) 65 - 99 mg/dL   BUN 16 6 - 20 mg/dL   Creatinine, Ser 1.71 (H) 0.61 - 1.24 mg/dL   Calcium 9.0 8.9 - 10.3 mg/dL   Total Protein 6.2 (L) 6.5 - 8.1 g/dL   Albumin 3.5 3.5 - 5.0 g/dL   AST 66 (H) 15 - 41 U/L   ALT 42 17 - 63 U/L   Alkaline Phosphatase 75 38 - 126 U/L   Total Bilirubin 2.9 (H) 0.3 - 1.2 mg/dL   GFR calc non Af Amer 38 (L) >60 mL/min   GFR calc Af Amer 44 (L) >60 mL/min    Comment: (NOTE) The eGFR has been calculated using the CKD EPI equation. This calculation has not been validated in all clinical situations. eGFR's persistently <60 mL/min signify possible Chronic Kidney Disease.    Anion gap 12 5 - 15  Protime-INR     Status: Abnormal   Collection Time: 02/16/17  8:50 AM  Result Value Ref Range   Prothrombin Time 16.5 (H) 11.4 - 15.2 seconds   INR 1.35   I-stat troponin, ED     Status: Abnormal   Collection Time: 02/16/17  8:52 AM  Result Value Ref Range   Troponin i, poc 0.20 (HH) 0.00 - 0.08 ng/mL   Comment NOTIFIED PHYSICIAN    Comment 3            Comment: Due to the release kinetics of cTnI, a negative result within the first hours of the onset of symptoms does not rule out myocardial infarction with certainty. If myocardial infarction is still suspected, repeat the test at appropriate intervals.    Dg Chest Port 1 View  Result Date: 02/16/2017 CLINICAL DATA:  Ventricular tachycardia. EXAM: PORTABLE CHEST 1 VIEW COMPARISON:  03/30/2014 FINDINGS: The lungs are clear without focal pneumonia, edema, pneumothorax or pleural effusion. Cardiopericardial silhouette is at upper limits of normal for size. The visualized bony structures of the thorax are intact. Pacer/AICD is new in the interval. Telemetry leads overlie the chest. Defibrillator pads overlie the left hemithorax. IMPRESSION: No active disease. Electronically Signed   By: Misty Stanley M.D.   On: 02/16/2017 08:55    ROS - all systems  reviewed and negative except as noted in the history of present illness  Blood pressure 93/70, pulse (!) 59, resp. rate 18, height _0  (1.6 m), weight 214 lb (97.1 kg),  SpO2 100 %. Physical Exam  Well appearing 71 year old man, NAD HEENT: Unremarkable,[place], AT Neck:  7 JVD, no thyromegally Back:  No CVA tenderness Lungs:  Clear with no wheezes, rales, or rhonchi HEART:  Regular rate rhythm, no murmurs, no rubs, no clicks Abd:  soft, positive bowel sounds, no organomegally, no rebound, no guarding Ext:  2 plus pulses, no edema, no cyanosis, no clubbing Skin:  No rashes no nodules Neuro:  CN II through XII intact, motor grossly intact EKG: EKG during ventricular tachycardia demonstrates a right bundle branch block ventricular tachycardia at 180 bpm  ICD interrogation has been carried out today. The patient's device appears to be working normally. Pacing and sensing are normal. His therapy was set up with a detection zone from 171-200. This was a monitor only zone.  Assessment/Plan 1. Sustained monomorphic ventricular tachycardia - the patient has had ventricular tachycardia below his device therapy. He appropriately did not receive therapy for SVT as it was slower than the therapy zone. We will reprogram the device to deliver therapy from the low 160s up to 200 in a VT zone with 4 burst and for ramp of anti-tachycardic pacing. Defibrillation shocks will  Follow. 2. Chronic systolic heart failure - his symptoms are class 2. No change in meds planned for now. 3. CAD - he denies any anginal symptoms. Will follow.  Gregg Taylor,M.D.  Cristopher Peru 02/16/2017, 10:37 AM

## 2017-02-16 NOTE — ED Notes (Signed)
[place] daughter

## 2017-02-16 NOTE — ED Provider Notes (Addendum)
[place] EMERGENCY DEPARTMENT Provider Note   CSN: [id number] Arrival date & time: 02/16/17  0831     History   Chief Complaint No chief complaint on file.   HPI Ethan Molner. is a 71 y.o. male.  HPI 71 year old man history of the tach/V. fib arrest in 2015, status post ICD placement presents today after V tach.  He reports feeling very weak and clammy this morning.  He called 911.  EMS reports that he was in ventricular tachycardia on their arrival.  He received amiodarone 150 mg x2.  After the second dose he became somewhat clammy air and nauseated at which time they delivered 100 mV denies cardioversion with resultant organized rhythm and pulses.  Patient remained awake and alert in route to hospital.  He is awake and able to give me history.  He denies any chest pain.  He states his only medical history is a cardiac arrest.  He denies being told that he has fibrillated in the past.       Past Medical History:  Diagnosis Date  . AICD (automatic cardioverter/defibrillator) present 03/31/14   a. 03/2014 s/p SJM 2411-36C dual chamber AICD (serial Number [id number])- followed by Dr. Caryl Comes  . Arthritis    a. L knee.  . Asthma   . Cancer ([place])    Melanoma resected from scalp  . Cardiac arrest [facility])    a. 03/30/2014 VF arrest in setting of NICM >> CPR/defib in community >> s/p dual chamber AICD  . Coronary artery disease, non-occlusive    a. cath 03/2014 at [facility]: no sig CAD (OM1 30%, CFX 30%), EF 35%  . DM2 (diabetes mellitus, type 2) ([place])    a. Diet controlled.  Marland Kitchen HLD (hyperlipidemia)   . HTN (hypertension)   . Kidney stones   . NICM (nonischemic cardiomyopathy) ([place])    a. 03/2014 Echo:  Inferolateral and lateral HK, EF 58-85%, grade 1 diastolic dysfunction, mild MR, mild LAE, mild RAE, trivial TR, no effusion.  . Paroxysmal atrial fibrillation ([place]) 03/28/2015  . Retinal artery occlusion     Patient Active Problem List   Diagnosis Date Noted  .  Paroxysmal atrial fibrillation ([place]) 03/28/2015  . Leg swelling 07/27/2014  . NICM (nonischemic cardiomyopathy) ([place]) 04/03/2014  . HTN (hypertension) 04/03/2014  . S/P implantation of automatic cardioverter/defibrillator (AICD) 04/03/2014  . Prolonged Q-T interval on ECG 04/03/2014  . Encounter for monitoring amiodarone therapy 04/03/2014  . Sinus node dysfunction/bradycardia 04/01/2014  . Elevated transaminase level 04/01/2014  . Cardiac arrest ([place]) 03/30/2014    Past Surgical History:  Procedure Laterality Date  . CARDIAC CATHETERIZATION  03/30/2014  . CARDIOVERSION  03/30/2014  . CYSTOSCOPY W/ LITHOLAPAXY / EHL    . IMPLANTABLE CARDIOVERTER DEFIBRILLATOR IMPLANT N/A 03/31/2014   Procedure: IMPLANTABLE CARDIOVERTER DEFIBRILLATOR IMPLANT;  Surgeon: Evans Lance, MD;  Location: [facility] C[facility] LAB;  Service: Cardiovascular;  Laterality: N/A;  . JOINT REPLACEMENT    . TONSILLECTOMY     "as a kid"  . TOTAL KNEE ARTHROPLASTY Left ~ 2010  . TOTAL KNEE ARTHROPLASTY WITH REVISION COMPONENTS Left ~ 2011  . VARICOSE VEIN SURGERY Left ~ 2014       Home Medications    Prior to Admission medications   Medication Sig Start Date End Date Taking? Authorizing Provider  amiodarone (PACERONE) 200 MG tablet TAKE ONE-HALF TABLET BY  MOUTH DAILY 05/08/16   Minna Merritts, MD  apixaban (ELIQUIS) 5 MG TABS tablet Take 1 tablet (5  mg total) by mouth 2 (two) times daily. 03/21/16   Deboraha Sprang, MD  atorvastatin (LIPITOR) 10 MG tablet Take 10 mg by mouth daily.    [provider]  budesonide-formoterol (SYMBICORT) 160-4.5 MCG/ACT inhaler Inhale 2 puffs into the lungs daily.    [provider]  carvedilol (COREG) 12.5 MG tablet Take 1.5 tablets (18.75 mg total) by mouth 2 (two) times daily. 04/16/16   Deboraha Sprang, MD  fexofenadine (ALLEGRA) 180 MG tablet Take 180 mg by mouth daily.    [provider]  furosemide (LASIX) 20 MG tablet Take 2 tablets (40 mg) by mouth daily on  the even days days of the month 12/03/16   Deboraha Sprang, MD  levothyroxine (SYNTHROID, LEVOTHROID) 25 MCG tablet Take 1 tablet (25 mcg total) by mouth daily before breakfast. 11/28/16   Deboraha Sprang, MD  losartan (COZAAR) 100 MG tablet TAKE 1 TABLET BY MOUTH  DAILY 05/13/16   Deboraha Sprang, MD  Multiple Vitamin (MULTIVITAMIN) capsule Take 1 capsule by mouth daily.    [provider]  Omega-3 Fatty Acids (FISH OIL) 1200 MG CAPS Take by mouth daily.    [provider]    Family History Family History  Problem Relation Age of Onset  . Esophageal cancer Mother        died at 17  . Heart attack Father        died at 57  . Hypertension Brother     Social History Social History   Tobacco Use  . Smoking status: Passive Smoke Exposure - Never Smoker  . Smokeless tobacco: Never Used  Substance Use Topics  . Alcohol use: No  . Drug use: No     Allergies   Patient has no known allergies.   Review of Systems Review of Systems  All other systems reviewed and are negative.    Physical Exam Updated Vital Signs BP (!) 86/51   Pulse 61   Resp 11   SpO2 100%   Physical Exam  Constitutional: He is oriented to person, place, and time. He appears well-developed and well-nourished. No distress.  Pale  HENT:  Head: Normocephalic and atraumatic.  Eyes: EOM are normal. Pupils are equal, round, and reactive to light.  Cardiovascular:  Bradycardic and regular  Pulmonary/Chest: Effort normal and breath sounds normal.  Abdominal: Soft. Bowel sounds are normal.  Musculoskeletal: Normal range of motion.  Neurological: He is alert and oriented to person, place, and time. No cranial nerve deficit. Coordination normal.  Skin: Skin is warm. Capillary refill takes less than 2 seconds. There is pallor.  Psychiatric: He has a normal mood and affect.  Nursing note and vitals reviewed.  Vitals:   02/16/17 0836 02/16/17 0837  BP: (!) 86/51   Pulse:  61  Resp:  11    SpO2:  100%     ED Treatments / Results  Labs (all labs ordered are listed, but only abnormal results are displayed) Labs Reviewed  CBC WITH DIFFERENTIAL/PLATELET  COMPREHENSIVE METABOLIC PANEL  PROTIME-INR  I-STAT TROPONIN, ED    EKG  EKG Interpretation None       Radiology No results found.  Procedures Procedures (including critical care time)  Medications Ordered in ED Medications - No data to display   Initial Impression / Assessment and Plan / ED Course  I have reviewed the triage vital signs and the nursing notes.  Pertinent labs & imaging results that were available during my care of  the patient were reviewed by me and considered in my medical decision making (see chart for details).    9:36 AM Daughter at bedside.  BP increase to 96. Discussed with Dr. Sallyanne Kuster and they will see and evaluate  1 ventricular tachycardia patient reverted prehospital had has been stable here cardiology consult obtained to evaluate his defibrillator and pacemaker 2 hyperglycemia 3 elevated creatinine Final Clinical Impressions(s) / ED Diagnoses   Final diagnoses:  Ventricular tachycardia [facility])    ED Discharge Orders    None       Pattricia Boss, MD 02/16/17 1105    Pattricia Boss, MD 02/16/17 1241

## 2017-02-17 ENCOUNTER — Other Ambulatory Visit: Payer: Self-pay | Admitting: Internal Medicine

## 2017-02-17 DIAGNOSIS — I472 Ventricular tachycardia: Secondary | ICD-10-CM | POA: Diagnosis not present

## 2017-02-17 LAB — BASIC METABOLIC PANEL
Anion gap: 5 (ref 5–15)
BUN: 16 mg/dL (ref 6–20)
CALCIUM: 8.8 mg/dL — AB (ref 8.9–10.3)
CHLORIDE: 105 mmol/L (ref 101–111)
CO2: 29 mmol/L (ref 22–32)
Creatinine, Ser: 1.43 mg/dL — ABNORMAL HIGH (ref 0.61–1.24)
GFR, EST AFRICAN AMERICAN: 55 mL/min — AB (ref >60)
GFR, EST NON AFRICAN AMERICAN: 47 mL/min — AB (ref >60)
Glucose, Bld: 99 mg/dL (ref 65–99)
POTASSIUM: 4 mmol/L (ref 3.5–5.1)
SODIUM: 139 mmol/L (ref 135–145)

## 2017-02-17 NOTE — Discharge Instructions (Signed)
NO DRIVING FOR 6 MONTHS °

## 2017-02-17 NOTE — Progress Notes (Signed)
Progress Note  Patient Name: Ethan Vega. Date of Encounter: 02/17/2017  Primary Cardiologist: MA  Primary Electrophysiologist: SK   Patient Profile     71 y.o. male  With NICM with ICD implanted originally for aborted cardiac arrest.  Now admitted w recurrent syncope 2/2 VT below detection rate  Subjective   Without complaints No chest pain or sob  Inpatient Medications    Scheduled Meds: . amiodarone  100 mg Oral Daily  . apixaban  5 mg Oral BID  . atorvastatin  10 mg Oral Daily  . carvedilol  18.75 mg Oral BID  . furosemide  40 mg Oral Daily  . levothyroxine  25 mcg Oral QAC breakfast  . losartan  100 mg Oral Daily  . multivitamin with minerals  1 tablet Oral Daily   Continuous Infusions:  PRN Meds: acetaminophen, guaiFENesin-dextromethorphan   Vital Signs    Vitals:   02/16/17 1556 02/16/17 1925 02/17/17 0024 02/17/17 0625  BP: 111/66 (!) 104/54 108/63 114/65  Pulse: 60 63 60 66  Resp: 20 18 18 18   Temp: 97.9 F (36.6 C) 98.3 F (36.8 C) 98 F (36.7 C) 98.2 F (36.8 C)  TempSrc: Oral Oral Oral Oral  SpO2: 98% 98% 96% 98%  Weight: 218 lb 12.8 oz (99.2 kg)   217 lb 3.2 oz (98.5 kg)  Height: 5\' 3"  (1.6 m)       Intake/Output Summary (Last 24 hours) at 02/17/2017 0808 Last data filed at 02/17/2017 0500 Gross per 24 hour  Intake 980 ml  Output 1050 ml  Net -70 ml   Filed Weights   02/16/17 0904 02/16/17 1556 02/17/17 0625  Weight: 214 lb (97.1 kg) 218 lb 12.8 oz (99.2 kg) 217 lb 3.2 oz (98.5 kg)    Telemetry    Sinus  - Personally Reviewed  ECG       Physical Exam   GEN: No acute distress.   Neck: JVD flat Cardiac: RRR, no  murmurs, rubs, or gallops.  Respiratory: Clear to auscultation bilaterally. GI: Soft, nontender, non-distended  MS:  edema; No deformity. Neuro:  Nonfocal  Psych: Normal affect  Skin Warm and dry   Labs    Chemistry Recent Labs  Lab 02/16/17 0850 02/17/17 0546  NA 140 139  K 4.5 4.0  CL 107 105    CO2 21* 29  GLUCOSE 201* 99  BUN 16 16  CREATININE 1.71* 1.43*  CALCIUM 9.0 8.8*  PROT 6.2*  --   ALBUMIN 3.5  --   AST 66*  --   ALT 42  --   ALKPHOS 75  --   BILITOT 2.9*  --   GFRNONAA 38* 47*  GFRAA 44* 55*  ANIONGAP 12 5     Hematology Recent Labs  Lab 02/16/17 0850  WBC 6.2  RBC 4.38  HGB 14.3  HCT 43.2  MCV 98.6  MCH 32.6  MCHC 33.1  RDW 14.0  PLT 212    Cardiac EnzymesNo results for input(s): TROPONINI in the last 168 hours.  Recent Labs  Lab 02/16/17 0852  TROPIPOC 0.20*     BNPNo results for input(s): BNP, PROBNP in the last 168 hours.   DDimer No results for input(s): DDIMER in the last 168 hours.   Radiology    Dg Chest Port 1 View  Result Date: 02/16/2017 CLINICAL DATA:  Ventricular tachycardia. EXAM: PORTABLE CHEST 1 VIEW COMPARISON:  03/30/2014 FINDINGS: The lungs are clear without focal pneumonia, edema, pneumothorax or pleural  effusion. Cardiopericardial silhouette is at upper limits of normal for size. The visualized bony structures of the thorax are intact. Pacer/AICD is new in the interval. Telemetry leads overlie the chest. Defibrillator pads overlie the left hemithorax. IMPRESSION: No active disease. Electronically Signed   By: Misty Stanley M.D.   On: 02/16/2017 08:55    Cardiac Studies    Echo P   Device Interrogation   Reviewed by Dr Elliot Cousin  Assessment & Plan    VT sustained monomorphic  NICM ? Infiltrative  Afib paroxysmal   Amiodarone   ICD    Device reprogrammed to address slow VT  No interval AFib  Renal insufficiency better  Reminded no driving x 6 m    Signed, Virl Axe, MD  02/17/2017, 8:08 AM

## 2017-02-17 NOTE — Discharge Summary (Signed)
ELECTROPHYSIOLOGY PROCEDURE DISCHARGE SUMMARY    Patient ID: Ethan Ciavarella.,  MRN: [id number], DOB/AGE: 1945/05/25 71 y.o.  Admit date: 02/16/2017 Discharge date: 02/17/2017  Primary Care Physician: Jodi Marble, MD  Primary Cardiologist/Electrophysiologist: Dr. Caryl Comes  Primary Discharge Diagnosis:  1. Syncope 2. VT  Secondary Discharge Diagnosis:  1. HTN 2. DM 3. NICM 4. Paroxysmal AFib     CHA2DS2Vasc is 4, on Eliquis appropriately dosed  No Known Allergies   Procedures This Admission:  None  Brief HPI: Ethan Frayre. is a 71 y.o. male with PMHx of aborted cardiac arrest w/ICD, NICM with improved LVEF, HTN, DM, PAFib was admitted to [facility] yesterday after a syncopal episode.  EMS found him in VT 180bpm and shocked out of the rhythm.  Hospital Course:  The patient was admitted and observed on telemetry without further VT, remaining in A paced/V sensed rhythm.  His device was interrogated, his VT was below detection rate and reprogrammed as noted by Dr. Lovena Le to be able to detect and treat slower VT. He was not felt to be acutely fluid OL, no anginal symptoms were described, with history of no obstructive disease by cath in 2016.  Labs noted acute renal insufficiency likely 2/2 VT and trending down today.   He haws ambulated without difficulty, and anxious to go home. The patient was examined by Dr. Caryl Comes and considered stable for discharge to home, no changes to be made to home regime. Follow up has been arranged.  The patient was informed of [place] law, no driving for 6 months  Physical Exam: Vitals:   02/17/17 0024 02/17/17 0625 02/17/17 0845 02/17/17 0948  BP: 108/63 114/65 (!) 100/52 (!) 107/56  Pulse: 60 66 66 66  Resp: 18 18  18   Temp: 98 F (36.7 C) 98.2 F (36.8 C)  97.9 F (36.6 C)  TempSrc: Oral Oral  Oral  SpO2: 96% 98%  100%  Weight:  217 lb 3.2 oz (98.5 kg)    Height:        GEN- The patient is well appearing, alert and oriented x 3  today.   HEENT: normocephalic, atraumatic; sclera clear, conjunctiva pink; hearing intact Lungs- CTA b/l, normal work of breathing.  No wheezes, rales, rhonchi Heart- RRR, no murmurs, rubs or gallops, PMI not laterally displaced GI- soft, non-tender, non-distended Extremities- no clubbing, cyanosis, or edema MS- no significant deformity or atrophy Skin- warm and dry, no rash or lesion Psych- euthymic mood, full affect Neuro- no gross defecits  Labs:   Lab Results  Component Value Date   WBC 6.2 02/16/2017   HGB 14.3 02/16/2017   HCT 43.2 02/16/2017   MCV 98.6 02/16/2017   PLT 212 02/16/2017    Recent Labs  Lab 02/16/17 0850 02/17/17 0546  NA 140 139  K 4.5 4.0  CL 107 105  CO2 21* 29  BUN 16 16  CREATININE 1.71* 1.43*  CALCIUM 9.0 8.8*  PROT 6.2*  --   BILITOT 2.9*  --   ALKPHOS 75  --   ALT 42  --   AST 66*  --   GLUCOSE 201* 99    Discharge Medications:  Allergies as of 02/17/2017   No Known Allergies     Medication List    TAKE these medications   amiodarone 200 MG tablet Commonly known as:  PACERONE TAKE ONE-HALF TABLET BY  MOUTH DAILY What changed:    how much to take  how to  take this  when to take this   apixaban 5 MG Tabs tablet Commonly known as:  ELIQUIS Take 1 tablet (5 mg total) by mouth 2 (two) times daily.   atorvastatin 10 MG tablet Commonly known as:  LIPITOR Take 10 mg by mouth daily.   budesonide-formoterol 160-4.5 MCG/ACT inhaler Commonly known as:  SYMBICORT Inhale 2 puffs into the lungs daily.   carvedilol 12.5 MG tablet Commonly known as:  COREG Take 1.5 tablets (18.75 mg total) by mouth 2 (two) times daily.   Fish Oil 1200 MG Caps Take 1,200 mg by mouth daily.   furosemide 20 MG tablet Commonly known as:  LASIX Take 2 tablets (40 mg) by mouth daily on the even days days of the month What changed:    how much to take  how to take this  when to take this  additional instructions   levothyroxine 25 MCG  tablet Commonly known as:  SYNTHROID, LEVOTHROID Take 1 tablet (25 mcg total) by mouth daily before breakfast.   losartan 100 MG tablet Commonly known as:  COZAAR TAKE 1 TABLET BY MOUTH  DAILY What changed:    how much to take  how to take this  when to take this   multivitamin capsule Take 1 capsule by mouth daily.       Disposition: Home Discharge Instructions    Diet - low sodium heart healthy   Complete by:  As directed    Increase activity slowly   Complete by:  As directed      Follow-up Information    Deboraha Sprang, MD Follow up on 03/13/2017.   Specialty:  Cardiology Why:  11:15AM Contact information: [address] [address] [phone]           Duration of Discharge Encounter: Greater than 30 minutes including physician time.  Venetia Night, PA-C 02/17/2017 11:05 AM

## 2017-02-17 NOTE — Telephone Encounter (Signed)
Refill Request.  

## 2017-03-13 ENCOUNTER — Ambulatory Visit: Payer: Medicare Other | Admitting: Internal Medicine

## 2017-03-13 ENCOUNTER — Other Ambulatory Visit: Payer: Self-pay | Admitting: *Deleted

## 2017-03-13 VITALS — BP 140/86 | HR 62 | Ht 63.0 in | Wt 213.0 lb

## 2017-03-13 DIAGNOSIS — I495 Sick sinus syndrome: Secondary | ICD-10-CM

## 2017-03-13 DIAGNOSIS — I428 Other cardiomyopathies: Secondary | ICD-10-CM | POA: Diagnosis not present

## 2017-03-13 DIAGNOSIS — R9431 Abnormal electrocardiogram [ECG] [EKG]: Secondary | ICD-10-CM | POA: Diagnosis not present

## 2017-03-13 DIAGNOSIS — I480 Paroxysmal atrial fibrillation: Secondary | ICD-10-CM

## 2017-03-13 DIAGNOSIS — Z95810 Presence of automatic (implantable) cardiac defibrillator: Secondary | ICD-10-CM

## 2017-03-13 MED ORDER — APIXABAN 5 MG PO TABS: 5.0000 mg | ORAL_TABLET | Freq: Two times a day (BID) | ORAL | 3 refills | Status: DC

## 2017-03-13 MED ORDER — AMIODARONE HCL 200 MG PO TABS: 200.0000 mg | ORAL_TABLET | Freq: Every day | ORAL | 3 refills | Status: DC

## 2017-03-13 MED ORDER — CARVEDILOL 25 MG PO TABS: 25.0000 mg | ORAL_TABLET | Freq: Two times a day (BID) | ORAL | 3 refills | Status: DC

## 2017-03-13 NOTE — Progress Notes (Signed)
Patient Care Team: Jodi Marble, MD as PCP - General (Internal Medicine)   HPI  Ethan Vega. is a 71 y.o. male Seen following ICD implantation 1/16 following aborted cardiac arrest. Cardiac evaluation at that time included an echocardiogram demonstrating normal left ventricular function had recovered from the EF of 30-35% range. Catheterization 1/16 demonstrated no significant coronary disease;   We undertook thallium for perfusion defects looking for infiltrative processes  this was abnormal raising the spectre of sarcoid,  Further eval has been unilluminating    CT-highresolution was neg for interstitial/LN   There were small nodules  Eye exam showed apparently no sarcoid  Flecainide challenge was negative ECG  Low Volts R-V1 with RAD with mild RA enlargement SAECG notdone as he was in AFib RVR and rate related RBBB  He has had no atrial fibrillation.  He remains on amiodarone and apixaban and is tolerating the amiodarone without complaints of sensitivity or nausea. NO bleeding on apixaban.  He was hospitalized 12/18 for syncope and was found to be in ventricular tachycardia below his detection rate.  Amiodarone was increased and the device was reprogrammed.  Since discharge, he feels  better He denies chest pain ; there is increasing shortness of breath with peripheral edema.  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 No ischemia  2/18 ECHO EF 40-45 %            Past Medical History:  Diagnosis Date  . AICD (automatic cardioverter/defibrillator) present 03/31/14   a. 03/2014 s/p  SJM 2411-36C dual chamber AICD (serial Number [id number])- followed by Dr. Caryl Comes  . Arthritis    a. L knee.  . Asthma   . Cancer ([place])    Melanoma resected from scalp  . Cardiac arrest [facility])    a. 03/30/2014 VF arrest in setting of NICM >> CPR/defib in community >> s/p dual chamber AICD  . Coronary artery disease, non-occlusive    a. cath 03/2014 at [facility]: no sig CAD (OM1 30%, CFX 30%), EF 35%  . DM2 (diabetes mellitus, type 2) ([place])    a. Diet controlled.  Marland Kitchen HLD (hyperlipidemia)   . HTN (hypertension)   . Kidney stones   . NICM (nonischemic cardiomyopathy) ([place])    a. 03/2014 Echo:  Inferolateral and lateral HK, EF 59-16%, grade 1 diastolic dysfunction, mild MR, mild LAE, mild RAE, trivial TR, no effusion.  . Paroxysmal atrial fibrillation ([place]) 03/28/2015  . Retinal artery occlusion     Past Surgical History:  Procedure Laterality Date  . CARDIAC CATHETERIZATION  03/30/2014  . CARDIOVERSION  03/30/2014  . CYSTOSCOPY W/ LITHOLAPAXY / EHL    . IMPLANTABLE CARDIOVERTER DEFIBRILLATOR IMPLANT N/A 03/31/2014   Procedure: IMPLANTABLE CARDIOVERTER DEFIBRILLATOR IMPLANT;  Surgeon: Evans Lance, MD;  Location: [facility] CATH LAB;  Service: Cardiovascular;  Laterality: N/A;  .  JOINT REPLACEMENT    . TONSILLECTOMY     "as a kid"  . TOTAL KNEE ARTHROPLASTY Left ~ 2010  . TOTAL KNEE ARTHROPLASTY WITH REVISION COMPONENTS Left ~ 2011  . VARICOSE VEIN SURGERY Left ~ 2014    Current Outpatient Medications  Medication Sig Dispense Refill  . amiodarone (PACERONE) 200 MG tablet TAKE ONE-HALF TABLET BY  MOUTH DAILY (Patient taking differently: TAKE 100 mg  BY  MOUTH DAILY) 45 tablet 3  . atorvastatin (LIPITOR) 10 MG tablet Take 10 mg by mouth daily.    . budesonide-formoterol (SYMBICORT) 160-4.5 MCG/ACT inhaler Inhale 2 puffs into the lungs daily.    . carvedilol (COREG) 12.5 MG tablet TAKE 1 AND 1/2 TABLETS BY  MOUTH TWO TIMES DAILY 270 tablet 1  . ELIQUIS 5 MG TABS tablet TAKE 1 TABLET BY MOUTH TWO  TIMES  DAILY 180 tablet 3  . furosemide (LASIX) 20 MG tablet Take 2 tablets (40 mg) by mouth daily on the even days days of the month (Patient taking differently: Take 20 mg by mouth daily. ) 90 tablet 3  . levothyroxine (SYNTHROID, LEVOTHROID) 75 MCG tablet Take 75 mcg by mouth daily.  2  . losartan (COZAAR) 100 MG tablet TAKE 1 TABLET BY MOUTH  DAILY (Patient taking differently: TAKE 100 mg BY MOUTH  DAILY) 90 tablet 3  . Multiple Vitamin (MULTIVITAMIN) capsule Take 1 capsule by mouth daily.    . Omega-3 Fatty Acids (FISH OIL) 1200 MG CAPS Take 1,200 mg by mouth daily.      No current facility-administered medications for this visit.     No Known Allergies  Review of Systems negative except from HPI and PMH  Physical Exam BP 140/86   Pulse 62   Ht 5\' 3"  (1.6 m)   Wt 213 lb (96.6 kg)   SpO2 96%   BMI 37.73 kg/m  Well developed and well nourished in no acute distress HENT normal E scleral and icterus clear Neck Supple JVP 6 ; carotids brisk and full Clear to ausculation Device pocket well healed; without hematoma or erythema.  There is no tethering Regular rate and rhythm, no murmurs gallops or rub Soft with active bowel sounds No clubbing cyanosis  1-2 +Edema Alert and oriented, grossly normal motor and sensory function Skin Warm and Dry   Atrial pacing at 60 with intrinsic conduction.  low voltage.  Right bundle branch block is present.  Assessment and  Plan  Aborted Cardiac arrest//FHx Cardiac Arrest   ICD  St Jude   Hypertension  Sinus bradycardia/chronotropic incompetence  Low Voltage ECG  Atrial Fibrillation   Retinal artery occlusion  Congestive heart failure-chronic-systolic/diastolic  On Anticoagulation;  No bleeding issues   willd volume overload  we'll have him resume his diuretics 40 mg every other day  Amiodarone surveillance labs TSH. We'll have him get this from his primary care physician  We will arrange pulmonary function testing serve as a  baseline for amiodarone.  No intercurrent Ventricular tachycardia  No intercurrent atrial fibrillation or flutter

## 2017-03-13 NOTE — Patient Instructions (Addendum)
Medication Instructions: Your physician has recommended you make the following change in your medication:  -1) INCREASE AMIODARONE 200 mg - Take 1 tablet (200 mg) by mouth daily - NEW RX SENT -2) INCREASE Carvedilol (Coreg) 25 mg - Take 1 tablet (25 mg) by mouth daily - NEW RX SENT  Labwork: Your physician has recommended that you have lab work today: TSH  Procedures/Testing: None Ordered  Follow-Up: Your physician recommends that you schedule a follow-up appointment in 3 MONTHS with Dr. Caryl Comes in [place]   Remote monitoring is used to monitor your ICD from home. This monitoring reduces the number of office visits required to check your device to one time per year. It allows [place] to keep an eye on the functioning of your device to ensure it is working properly. You are scheduled for a device check from home on 04/15/17. You may send your transmission at any time that day. If you have a wireless device, the transmission will be sent automatically. After your physician reviews your transmission, you will receive a postcard with your next transmission date.    If you need a refill on your cardiac medications before your next appointment, please call your pharmacy.

## 2017-03-14 LAB — TSH: TSH: 2.36 u[IU]/mL (ref 0.450–4.500)

## 2017-03-17 ENCOUNTER — Telehealth: Payer: Self-pay | Admitting: Internal Medicine

## 2017-03-17 ENCOUNTER — Telehealth: Payer: Self-pay | Admitting: *Deleted

## 2017-03-17 NOTE — Telephone Encounter (Signed)
Please call today

## 2017-03-17 NOTE — Telephone Encounter (Signed)
   Primary Cardiologist: Virl Axe, MD  Chart reviewed as part of pre-operative protocol coverage. He has h/o aborted cardiac arrest s/p AICD 2016, non-ischemic cardiomyopathy, ventricular tachycardia 02/2017, DM, HTN, HLD, retinal artery occlusion, paroxysmal atrial fib recently seen by Dr. Caryl Comes 03/13/17. Will route to pharmacy for input on Eliquis. Given complex PMH, recent admission for v-tach, and very recent OV, will forward to Dr. Caryl Comes for input on clearance for colonoscopy.  Dr. Caryl Comes, please route reply to P CV DIV PREOP.  Callback team, please notify requesting party that clearance request has been received and pending MD input.  Charlie Pitter, PA-C 03/17/2017, 2:08 PM

## 2017-03-17 NOTE — Telephone Encounter (Signed)
I called and spoke with the patient. I advised him we have received no request for clearance for him from [facility] at this time. He is aware that I will review this with Dr. Caryl Comes when he is back in the office on Thursday morning.  The patient is concerned since [facility] suggested her hold eliquis for 5 days prior. I advised him he will not need to hold eliquis that long.  He is also concerned that the meds they have on file for him do not match what we have. He is aware that he will need to call [facility] and update them of what he is taking.  Otherwise, he is aware I will call him back on Thursday after reviewing clearance and how long to hold eliquis prior to colonscopy with Dr. Caryl Comes. He is agreeable.

## 2017-03-17 NOTE — Telephone Encounter (Addendum)
Pt takes Eliquis for afib with CHADS2VASc score of 6 (age, HTN, DM, ocular stroke, CHF). SCr is 1.43 with CrCl of 81mL/min. Recommend holding Eliquis for only 24 hours given elevated cardiac risk.

## 2017-03-17 NOTE — Telephone Encounter (Signed)
Patient returning call to go over Eliquis instructions

## 2017-03-17 NOTE — Telephone Encounter (Signed)
S/w Rhonda at [facility]. Pt will need cardiac clearance and instructions regarding Eliquis. She will fax request to our office now. Notified patient.

## 2017-03-17 NOTE — Telephone Encounter (Signed)
   [facility] Pre-operative Risk Assessment    Request for surgical clearance:  1. What type of surgery is being performed? COLONOSCOPY   2. When is this surgery scheduled? 03/24/17   3. Are there any medications that need to be held prior to surgery and how long?ELIQUIS   4. Practice name and name of physician performing surgery? [facility]   5. What is your office phone and fax number? PH# [phone], FAX # [phone]   5. Anesthesia type (None, local, MAC, general) ? MONITORED ANESTHESIA    Julaine Hua 03/17/2017, 1:26 PM  _________________________________________________________________   (provider comments below)

## 2017-03-17 NOTE — Telephone Encounter (Signed)
Pt c/o medication issue:  1. Name of Medication: ALL  2. How are you currently taking this medication (dosage and times per day)? See med list   3. Are you having a reaction (difficulty breathing--STAT)? No   4. What is your medication issue? Patient has upcoming colonoscopy on 03-24-17 and wants to discuss which meds are and are not safe to take prior to procedure

## 2017-03-17 NOTE — Telephone Encounter (Signed)
Tried to call [facility] to let them know the clearance has been received and is awaiting on MD input. Their offices were closed. Will leave in preop call back pool for someone to try again Wednesday, 03/20/17

## 2017-03-20 ENCOUNTER — Encounter: Payer: Self-pay | Admitting: Internal Medicine

## 2017-03-20 NOTE — Telephone Encounter (Signed)
Faxed to [facility] at [phone]- confirmation received.   The patient is aware this has been faxed and of instructions on holding eliquis x 24 hours prior to procedure.

## 2017-03-20 NOTE — Telephone Encounter (Addendum)
Reviewed cardiac clearance for colonoscopy with Dr. Caryl Comes. Per Dr. Caryl Comes, the patient is at acceptable cardiovascular risk for colonoscopy- he may hold his eliquis for 24 hours prior to procedure.   Will send clearance to [facility].

## 2017-03-20 NOTE — Progress Notes (Signed)
Paperwork for eliquis patient assistance completed by Dr. Caryl Comes and mailed back to [facility].

## 2017-03-20 NOTE — Telephone Encounter (Signed)
Reviewed cardiac clearance for colonoscopy with Dr. Caryl Comes. Per Dr. Caryl Comes, the patient is at acceptable cardiovascular risk for colonoscopy- he may hold his eliquis for 24 hours prior to procedure.   Faxed to [facility] at [phone]- confirmation received.   The patient is aware this has been faxed and of instructions on holding eliquis x 24 hours prior to procedure.

## 2017-03-21 ENCOUNTER — Encounter: Payer: Self-pay | Admitting: *Deleted

## 2017-03-24 ENCOUNTER — Ambulatory Visit: Payer: Medicare HMO | Admitting: Anesthesiology

## 2017-03-24 ENCOUNTER — Ambulatory Visit
Admission: RE | Admit: 2017-03-24 | Discharge: 2017-03-24 | Disposition: A | Discharge status: Home / Self Care | Payer: Medicare HMO | Source: Ambulatory Visit | Attending: Gastroenterology | Admitting: Gastroenterology

## 2017-03-24 ENCOUNTER — Encounter
Admission: RE | Disposition: A | Discharge status: Home / Self Care | Payer: Self-pay | Source: Ambulatory Visit | Attending: Gastroenterology

## 2017-03-24 DIAGNOSIS — K648 Other hemorrhoids: Secondary | ICD-10-CM | POA: Insufficient documentation

## 2017-03-24 DIAGNOSIS — D125 Benign neoplasm of sigmoid colon: Secondary | ICD-10-CM | POA: Diagnosis not present

## 2017-03-24 DIAGNOSIS — Z95810 Presence of automatic (implantable) cardiac defibrillator: Secondary | ICD-10-CM | POA: Insufficient documentation

## 2017-03-24 DIAGNOSIS — D122 Benign neoplasm of ascending colon: Secondary | ICD-10-CM | POA: Diagnosis not present

## 2017-03-24 DIAGNOSIS — D120 Benign neoplasm of cecum: Secondary | ICD-10-CM | POA: Diagnosis not present

## 2017-03-24 DIAGNOSIS — I480 Paroxysmal atrial fibrillation: Secondary | ICD-10-CM | POA: Diagnosis not present

## 2017-03-24 DIAGNOSIS — J45909 Unspecified asthma, uncomplicated: Secondary | ICD-10-CM | POA: Diagnosis not present

## 2017-03-24 DIAGNOSIS — E119 Type 2 diabetes mellitus without complications: Secondary | ICD-10-CM | POA: Insufficient documentation

## 2017-03-24 DIAGNOSIS — I10 Essential (primary) hypertension: Secondary | ICD-10-CM | POA: Insufficient documentation

## 2017-03-24 DIAGNOSIS — I2510 Atherosclerotic heart disease of native coronary artery without angina pectoris: Secondary | ICD-10-CM | POA: Insufficient documentation

## 2017-03-24 DIAGNOSIS — M1990 Unspecified osteoarthritis, unspecified site: Secondary | ICD-10-CM | POA: Insufficient documentation

## 2017-03-24 DIAGNOSIS — Z86010 Personal history of colonic polyps: Secondary | ICD-10-CM | POA: Insufficient documentation

## 2017-03-24 DIAGNOSIS — E039 Hypothyroidism, unspecified: Secondary | ICD-10-CM | POA: Diagnosis not present

## 2017-03-24 DIAGNOSIS — Z8674 Personal history of sudden cardiac arrest: Secondary | ICD-10-CM | POA: Insufficient documentation

## 2017-03-24 DIAGNOSIS — K5730 Diverticulosis of large intestine without perforation or abscess without bleeding: Secondary | ICD-10-CM | POA: Insufficient documentation

## 2017-03-24 DIAGNOSIS — Z1211 Encounter for screening for malignant neoplasm of colon: Secondary | ICD-10-CM | POA: Insufficient documentation

## 2017-03-24 DIAGNOSIS — E785 Hyperlipidemia, unspecified: Secondary | ICD-10-CM | POA: Insufficient documentation

## 2017-03-24 DIAGNOSIS — K621 Rectal polyp: Secondary | ICD-10-CM | POA: Insufficient documentation

## 2017-03-24 DIAGNOSIS — Z79899 Other long term (current) drug therapy: Secondary | ICD-10-CM | POA: Diagnosis not present

## 2017-03-24 DIAGNOSIS — Z85820 Personal history of malignant melanoma of skin: Secondary | ICD-10-CM | POA: Diagnosis not present

## 2017-03-24 DIAGNOSIS — Z87442 Personal history of urinary calculi: Secondary | ICD-10-CM | POA: Insufficient documentation

## 2017-03-24 DIAGNOSIS — D123 Benign neoplasm of transverse colon: Secondary | ICD-10-CM | POA: Insufficient documentation

## 2017-03-24 HISTORY — PX: COLONOSCOPY WITH PROPOFOL: SHX5780

## 2017-03-24 SURGERY — COLONOSCOPY WITH PROPOFOL
Anesthesia: General

## 2017-03-24 MED ORDER — MIDAZOLAM HCL 2 MG/2ML IJ SOLN
INTRAMUSCULAR | Status: DC | PRN
  Administered 2017-03-24: 1 mg via INTRAVENOUS

## 2017-03-24 MED ORDER — MIDAZOLAM HCL 2 MG/2ML IJ SOLN
INTRAMUSCULAR | Status: AC
  Filled 2017-03-24: qty 2

## 2017-03-24 MED ORDER — PROPOFOL 500 MG/50ML IV EMUL
INTRAVENOUS | Status: AC
  Filled 2017-03-24: qty 50

## 2017-03-24 MED ORDER — SODIUM CHLORIDE 0.9 % IV SOLN
INTRAVENOUS | Status: DC
  Administered 2017-03-24: 08:00:00 via INTRAVENOUS

## 2017-03-24 MED ORDER — FENTANYL CITRATE (PF) 100 MCG/2ML IJ SOLN
INTRAMUSCULAR | Status: AC
  Filled 2017-03-24: qty 2

## 2017-03-24 MED ORDER — LIDOCAINE HCL (PF) 2 % IJ SOLN
INTRAMUSCULAR | Status: AC
  Filled 2017-03-24: qty 10

## 2017-03-24 MED ORDER — PROPOFOL 10 MG/ML IV BOLUS
INTRAVENOUS | Status: DC | PRN
  Administered 2017-03-24: 30 mg via INTRAVENOUS

## 2017-03-24 MED ORDER — LIDOCAINE HCL (CARDIAC) 20 MG/ML IV SOLN
INTRAVENOUS | Status: DC | PRN
  Administered 2017-03-24: 10 mg via INTRAVENOUS

## 2017-03-24 MED ORDER — FENTANYL CITRATE (PF) 100 MCG/2ML IJ SOLN
INTRAMUSCULAR | Status: DC | PRN
  Administered 2017-03-24: 50 ug via INTRAVENOUS

## 2017-03-24 MED ORDER — EPHEDRINE SULFATE 50 MG/ML IJ SOLN
INTRAMUSCULAR | Status: DC | PRN
  Administered 2017-03-24: 10 mg via INTRAVENOUS
  Administered 2017-03-24 (×2): 5 mg via INTRAVENOUS

## 2017-03-24 MED ORDER — PROPOFOL 500 MG/50ML IV EMUL
INTRAVENOUS | Status: DC | PRN
  Administered 2017-03-24: 140 ug/kg/min via INTRAVENOUS

## 2017-03-24 MED ORDER — PHENYLEPHRINE HCL 10 MG/ML IJ SOLN
INTRAMUSCULAR | Status: AC
  Filled 2017-03-24: qty 1

## 2017-03-24 MED ORDER — PHENYLEPHRINE HCL 10 MG/ML IJ SOLN
INTRAMUSCULAR | Status: DC | PRN
  Administered 2017-03-24: 200 ug via INTRAVENOUS
  Administered 2017-03-24: 100 ug via INTRAVENOUS

## 2017-03-24 NOTE — Op Note (Signed)
[facility] Gastroenterology Patient Name: Ethan Vega Procedure Date: 03/24/2017 7:57 AM MRN: [id number] Account #: [id number] Date of Birth: 1945-10-17 Admit Type: Outpatient Age: 72 Room: [facility] ENDO ROOM 1 Gender: Male Note Status: Finalized Procedure:            Colonoscopy Indications:          Personal history of colonic polyps Providers:            Lollie Sails, MD Referring MD:         Venetia Maxon. Elijio Miles, MD (Referring MD) Medicines:            Monitored Anesthesia Care Complications:        No immediate complications. Procedure:            Pre-Anesthesia Assessment:                       - ASA Grade Assessment: III - A patient with severe                        systemic disease.                       After obtaining informed consent, the colonoscope was                        passed under direct vision. Throughout the procedure,                        the patient's blood pressure, pulse, and oxygen                        saturations were monitored continuously. The                        Colonoscope was introduced through the anus and                        advanced to the the cecum, identified by appendiceal                        orifice and ileocecal valve. The colonoscopy was                        performed without difficulty. The patient tolerated the                        procedure well. The quality of the bowel preparation                        was good. Findings:      A 5 mm polyp was found in the splenic flexure. The polyp was sessile.       The polyp was removed with a cold snare. Resection and retrieval were       complete.      A 5 mm polyp was found in the hepatic flexure. The polyp was sessile.       The polyp was removed with a cold snare. Resection and retrieval were       complete.      Eight sessile polyps were found in the ascending colon. The polyps were  3 to 9 mm in size. These polyps were removed with a cold snare.        Resection and retrieval were complete. To prevent bleeding after the       polypectomy, one hemostatic clip was successfully placed. There was no       bleeding at the end of the maneuver.      Two sessile polyps were found in the cecum. The polyps were 2 to 4 mm in       size. These polyps were removed with a cold snare. Resection and       retrieval were complete.      A 6 mm polyp was found in the proximal transverse colon. The polyp was       sessile. The polyp was removed with a cold snare. Resection and       retrieval were complete.      A 6 mm polyp was found in the sigmoid colon. The polyp was sessile. The       polyp was removed with a cold snare. Resection and retrieval were       complete.      Two sessile polyps were found in the rectum. The polyps were 2 mm in       size. These polyps were removed with a cold biopsy forceps. Resection       and retrieval were complete.      Multiple small-mouthed diverticula were found in the sigmoid colon and       descending colon. Impression:           - One 5 mm polyp at the splenic flexure, removed with a                        cold snare. Resected and retrieved.                       - One 5 mm polyp at the hepatic flexure, removed with a                        cold snare. Resected and retrieved.                       - Eight 3 to 9 mm polyps in the ascending colon,                        removed with a cold snare. Resected and retrieved. Clip                        was placed.                       - Two 2 to 4 mm polyps in the cecum, removed with a                        cold snare. Resected and retrieved.                       - One 6 mm polyp in the proximal transverse colon,                        removed with a cold snare. Resected and retrieved.                       -  One 6 mm polyp in the sigmoid colon, removed with a                        cold snare. Resected and retrieved.                       - Two 2 mm polyps in  the rectum, removed with a cold                        biopsy forceps. Resected and retrieved.                       - Diverticulosis in the sigmoid colon and in the                        descending colon. Recommendation:       - Discharge patient to home.                       - Full liquid diet today, then advance as tolerated to                        soft diet for 3 days. Procedure Code(s):    --- Professional ---                       [phone], Colonoscopy, flexible; with removal of tumor(s),                        polyp(s), or other lesion(s) by snare technique                       45380, 22, Colonoscopy, flexible; with biopsy, single                        or multiple Diagnosis Code(s):    --- Professional ---                       D12.3, Benign neoplasm of transverse colon (hepatic                        flexure or splenic flexure)                       D12.5, Benign neoplasm of sigmoid colon                       D12.2, Benign neoplasm of ascending colon                       D12.0, Benign neoplasm of cecum                       K62.1, Rectal polyp                       Z86.010, Personal history of colonic polyps                       K57.30, Diverticulosis of large intestine without  perforation or abscess without bleeding CPT copyright 2016 American Medical Association. All rights reserved. The codes documented in this report are preliminary and upon coder review may  be revised to meet current compliance requirements. Lollie Sails, MD 03/24/2017 9:25:56 AM This report has been signed electronically. Number of Addenda: 0 Note Initiated On: 03/24/2017 7:57 AM Scope Withdrawal Time: 0 hours 26 minutes 0 seconds  Total Procedure Duration: 0 hours 46 minutes 37 seconds       [facility]

## 2017-03-24 NOTE — Anesthesia Post-op Follow-up Note (Signed)
Anesthesia QCDR form completed.        

## 2017-03-24 NOTE — Anesthesia Postprocedure Evaluation (Signed)
Anesthesia Post Note  Patient: Ethan Vega.  Procedure(s) Performed: COLONOSCOPY WITH PROPOFOL (N/A )  Patient location during evaluation: Endoscopy Anesthesia Type: General Level of consciousness: awake and alert Pain management: pain level controlled Vital Signs Assessment: post-procedure vital signs reviewed and stable Respiratory status: spontaneous breathing, nonlabored ventilation, respiratory function stable and patient connected to nasal cannula oxygen Cardiovascular status: blood pressure returned to baseline and stable Postop Assessment: no apparent nausea or vomiting Anesthetic complications: no     Last Vitals:  Vitals:   03/24/17 0949 03/24/17 0959  BP: 126/64 124/69  Pulse: 60 61  Resp: 17 15  Temp:    SpO2: 100% 99%    Last Pain:  Vitals:   03/24/17 0929  TempSrc: Tympanic                 Martha Clan

## 2017-03-24 NOTE — Transfer of Care (Signed)
Immediate Anesthesia Transfer of Care Note  Patient: Ethan Vega.  Procedure(s) Performed: COLONOSCOPY WITH PROPOFOL (N/A )  Patient Location: PACU  Anesthesia Type:General  Level of Consciousness: awake  Airway & Oxygen Therapy: Patient Spontanous Breathing and Patient connected to nasal cannula oxygen  Post-op Assessment: Report given to RN and Post -op Vital signs reviewed and stable  Post vital signs: Reviewed and stable  Last Vitals:  Vitals:   03/24/17 0754 03/24/17 0929  BP: 139/74 (!) 96/54  Pulse: 78 60  Resp: 20 20  Temp: (!) 36.1 C (!) 35.8 C  SpO2: 100% 100%    Last Pain:  Vitals:   03/24/17 0929  TempSrc: Tympanic         Complications: No apparent anesthesia complications

## 2017-03-24 NOTE — H&P (Addendum)
Outpatient short stay form Pre-procedure 03/24/2017 8:03 AM Lollie Sails MD  Primary Physician:  Dr Gerald Dexter  Reason for visit:  Colonoscopy  History of present illness:  Patient is a 72 year old male presenting today for colonoscopy in regards to his personal history of adenomatous colon polyps. He tolerated his prep well. He does take eliquis which she has held for about 48 hours. He takes no other aspirin or blood thinning agent. He does have a patient/AICD in regards to his history of cardiac myopathy.    Current Facility-Administered Medications:  .  0.9 %  sodium chloride infusion, , Intravenous, Continuous, Lollie Sails, MD .  0.9 %  sodium chloride infusion, , Intravenous, Continuous, Lollie Sails, MD  Medications Prior to Admission  Medication Sig Dispense Refill Last Dose  . amiodarone (PACERONE) 200 MG tablet Take 1 tablet (200 mg total) by mouth daily. 90 tablet 3 03/24/2017 at 0615  . apixaban (ELIQUIS) 5 MG TABS tablet Take 1 tablet (5 mg total) by mouth 2 (two) times daily. 180 tablet 3 Past Week at Unknown time  . atorvastatin (LIPITOR) 10 MG tablet Take 10 mg by mouth daily.   03/23/2017 at Unknown time  . budesonide-formoterol (SYMBICORT) 160-4.5 MCG/ACT inhaler Inhale 2 puffs into the lungs daily.   03/24/2017 at 0615  . carvedilol (COREG) 25 MG tablet Take 1 tablet (25 mg total) by mouth 2 (two) times daily with a meal. 180 tablet 3 03/24/2017 at 0615  . Cholecalciferol 1000 units tablet Take 1,000 Units by mouth daily.   Past Week at Unknown time  . fluticasone (FLONASE) 50 MCG/ACT nasal spray Place into both nostrils daily.   Past Week at Unknown time  . furosemide (LASIX) 20 MG tablet Take 2 tablets (40 mg) by mouth daily on the even days days of the month (Patient taking differently: Take 20 mg by mouth daily. ) 90 tablet 3 Past Week at Unknown time  . levothyroxine (SYNTHROID, LEVOTHROID) 75 MCG tablet Take 75 mcg by mouth daily.  2 Past Week at  Unknown time  . losartan (COZAAR) 100 MG tablet TAKE 1 TABLET BY MOUTH  DAILY (Patient taking differently: TAKE 100 mg BY MOUTH  DAILY) 90 tablet 3 Past Week at Unknown time  . Multiple Vitamin (MULTIVITAMIN) capsule Take 1 capsule by mouth daily.   Past Week at Unknown time  . Omega-3 Fatty Acids (FISH OIL) 1200 MG CAPS Take 1,200 mg by mouth daily.    Past Week at Unknown time     No Known Allergies   Past Medical History:  Diagnosis Date  . AICD (automatic cardioverter/defibrillator) present 03/31/14   a. 03/2014 s/p SJM 2411-36C dual chamber AICD (serial Number [id number])- followed by Dr. Caryl Comes  . Arthritis    a. L knee.  . Asthma   . Cancer ([place])    Melanoma resected from scalp  . Cardiac arrest [facility])    a. 03/30/2014 VF arrest in setting of NICM >> CPR/defib in community >> s/p dual chamber AICD  . Coronary artery disease, non-occlusive    a. cath 03/2014 at [facility]: no sig CAD (OM1 30%, CFX 30%), EF 35%  . DM2 (diabetes mellitus, type 2) ([place])    a. Diet controlled.  Marland Kitchen HLD (hyperlipidemia)   . HTN (hypertension)   . Kidney stones   . NICM (nonischemic cardiomyopathy) ([place])    a. 03/2014 Echo:  Inferolateral and lateral HK, EF 93-79%, grade 1 diastolic dysfunction, mild MR, mild LAE, mild RAE,  trivial TR, no effusion.  . Paroxysmal atrial fibrillation ([place]) 03/28/2015  . Retinal artery occlusion     Review of systems:      Physical Exam    Heart and lungs: Regular rate and rhythm without rub or gallop, lungs are bilaterally clear    HEENT: Normocephalic atraumatic eyes are anicteric    Other:     Pertinant exam for procedure: Soft nontender nondistended bowel sounds positive and normoactive.    Planned proceedures: Colonoscopy and indicated procedures. I have discussed the risks benefits and complications of procedures to include not limited to bleeding, infection, perforation and the risk of sedation and the patient wishes to proceed.    Lollie Sails,  MD Gastroenterology 03/24/2017  8:03 AM

## 2017-03-24 NOTE — Anesthesia Preprocedure Evaluation (Signed)
Anesthesia Evaluation  Patient identified by MRN, date of birth, ID band Patient awake    Reviewed: Allergy & Precautions, H&P , NPO status , Patient's Chart, lab work & pertinent test results, reviewed documented beta blocker date and time   History of Anesthesia Complications Negative for: history of anesthetic complications  Airway Mallampati: II  TM Distance: >3 FB Neck ROM: full    Dental  (+) Dental Advidsory Given, Partial Upper   Pulmonary neg shortness of breath, asthma , neg sleep apnea, neg COPD, neg recent URI,           Cardiovascular Exercise Tolerance: Good hypertension, (-) angina+ CAD and + Peripheral Vascular Disease  (-) Past MI, (-) Cardiac Stents and (-) CABG (-) dysrhythmias (-) pacemaker+ Cardiac Defibrillator (-) Valvular Problems/Murmurs  NICM   Neuro/Psych negative neurological ROS  negative psych ROS   GI/Hepatic negative GI ROS, Neg liver ROS,   Endo/Other  diabetes, Well ControlledHypothyroidism   Renal/GU Renal disease  negative genitourinary   Musculoskeletal   Abdominal   Peds  Hematology negative hematology ROS (+)   Anesthesia Other Findings Past Medical History: 03/31/14: AICD (automatic cardioverter/defibrillator) present     Comment:  a. 03/2014 s/p SJM 2411-36C dual chamber AICD (serial               Number [id number])- followed by Dr. Caryl Comes No date: Arthritis     Comment:  a. L knee. No date: Asthma No date: Cancer [facility])     Comment:  Melanoma resected from scalp No date: Cardiac arrest [facility])     Comment:  a. 03/30/2014 VF arrest in setting of NICM >> CPR/defib               in community >> s/p dual chamber AICD No date: Coronary artery disease, non-occlusive     Comment:  a. cath 03/2014 at [facility]: no sig CAD (OM1 30%, CFX 30%),               EF 35% No date: DM2 (diabetes mellitus, type 2) ([place])     Comment:  a. Diet controlled. No date: HLD (hyperlipidemia) No date: HTN  (hypertension) No date: Kidney stones No date: NICM (nonischemic cardiomyopathy) ([place])     Comment:  a. 03/2014 Echo:  Inferolateral and lateral HK, EF               82-95%, grade 1 diastolic dysfunction, mild MR, mild LAE,              mild RAE, trivial TR, no effusion. 03/28/2015: Paroxysmal atrial fibrillation (HCC) No date: Retinal artery occlusion   Reproductive/Obstetrics negative OB ROS                             Anesthesia Physical Anesthesia Plan  ASA: III  Anesthesia Plan: General   Post-op Pain Management:    Induction: Intravenous  PONV Risk Score and Plan: 2 and Propofol infusion  Airway Management Planned: Nasal Cannula  Additional Equipment:   Intra-op Plan:   Post-operative Plan:   Informed Consent: I have reviewed the patients History and Physical, chart, labs and discussed the procedure including the risks, benefits and alternatives for the proposed anesthesia with the patient or authorized representative who has indicated his/her understanding and acceptance.   Dental Advisory Given  Plan Discussed with: Anesthesiologist, CRNA and Surgeon  Anesthesia Plan Comments:         Anesthesia Quick Evaluation

## 2017-03-24 NOTE — Anesthesia Procedure Notes (Signed)
Date/Time: 03/24/2017 8:20 AM Performed by: Allean Found, CRNA Pre-anesthesia Checklist: Timeout performed, Patient being monitored, Suction available, Emergency Drugs available and Patient identified Patient Re-evaluated:Patient Re-evaluated prior to induction Oxygen Delivery Method: Nasal cannula Placement Confirmation: positive ETCO2

## 2017-03-25 ENCOUNTER — Encounter: Payer: Medicare Other | Admitting: Internal Medicine

## 2017-03-25 ENCOUNTER — Encounter: Payer: Self-pay | Admitting: Gastroenterology

## 2017-03-25 ENCOUNTER — Telehealth: Payer: Self-pay | Admitting: Internal Medicine

## 2017-03-25 LAB — SURGICAL PATHOLOGY

## 2017-03-25 MED ORDER — APIXABAN 5 MG PO TABS: 5.0000 mg | ORAL_TABLET | Freq: Two times a day (BID) | ORAL | 3 refills | Status: DC

## 2017-03-25 NOTE — Telephone Encounter (Signed)
RX sent to [facility] via Epic for Eliquis 5 mg BID #180 w/ 3 refills.

## 2017-03-25 NOTE — Telephone Encounter (Signed)
Patient is new with [facility] They need new prescription from Dr. Caryl Comes for [place] Fax [phone]

## 2017-04-11 LAB — CUP PACEART INCLINIC DEVICE CHECK
HighPow Impedance: 61.875
Implantable Lead Implant Date: 20160114
Implantable Lead Location: 753860
Implantable Lead Model: 7122
Implantable Pulse Generator Implant Date: 20160114
Lead Channel Pacing Threshold Pulse Width: 0.5 ms
Lead Channel Sensing Intrinsic Amplitude: 10.4 mV
Lead Channel Sensing Intrinsic Amplitude: 2.5 mV
Lead Channel Setting Pacing Amplitude: 1.75 V
Lead Channel Setting Pacing Pulse Width: 0.5 ms
MDC IDC LEAD IMPLANT DT: 20160114
MDC IDC LEAD LOCATION: 753859
MDC IDC MSMT BATTERY REMAINING LONGEVITY: 62 mo
MDC IDC MSMT LEADCHNL RA IMPEDANCE VALUE: 375 Ohm
MDC IDC MSMT LEADCHNL RA PACING THRESHOLD AMPLITUDE: 0.75 V
MDC IDC MSMT LEADCHNL RA PACING THRESHOLD PULSEWIDTH: 0.5 ms
MDC IDC MSMT LEADCHNL RV IMPEDANCE VALUE: 350 Ohm
MDC IDC MSMT LEADCHNL RV PACING THRESHOLD AMPLITUDE: 1 V
MDC IDC PG SERIAL: 7241695
MDC IDC SESS DTM: 20181227164050
MDC IDC SET LEADCHNL RV PACING AMPLITUDE: 2.5 V
MDC IDC SET LEADCHNL RV SENSING SENSITIVITY: 0.5 mV
MDC IDC STAT BRADY RA PERCENT PACED: 99 %
MDC IDC STAT BRADY RV PERCENT PACED: 0.14 %

## 2017-04-15 ENCOUNTER — Ambulatory Visit (INDEPENDENT_AMBULATORY_CARE_PROVIDER_SITE_OTHER): Payer: Medicare HMO | Admitting: *Deleted

## 2017-04-15 DIAGNOSIS — I428 Other cardiomyopathies: Secondary | ICD-10-CM

## 2017-04-15 NOTE — Progress Notes (Signed)
Remote ICD transmission.   

## 2017-04-17 ENCOUNTER — Encounter: Payer: Self-pay | Admitting: Cardiology

## 2017-04-25 ENCOUNTER — Telehealth: Payer: Self-pay | Admitting: Internal Medicine

## 2017-04-25 MED ORDER — LOSARTAN POTASSIUM 100 MG PO TABS: 100.0000 mg | ORAL_TABLET | Freq: Every day | ORAL | 3 refills | Status: DC

## 2017-04-25 NOTE — Telephone Encounter (Signed)
°*  STAT* If patient is at the pharmacy, call can be transferred to refill team.   1. Which medications need to be refilled? (please list name of each medication and dose if known) losartan (COZAAR) 100 MG 1 tablet daily   2. Which pharmacy/location (including street and city if local pharmacy) is medication to be sent to? [facility] mail order - fax [phone]  3. Do they need a 30 day or 90 day supply? 90 day

## 2017-04-30 LAB — CUP PACEART REMOTE DEVICE CHECK
Battery Remaining Longevity: 58 mo
Battery Remaining Percentage: 66 %
Brady Statistic AP VS Percent: 98 %
Brady Statistic AS VS Percent: 1 %
Brady Statistic RV Percent Paced: 1 %
HIGH POWER IMPEDANCE MEASURED VALUE: 65 Ohm
HIGH POWER IMPEDANCE MEASURED VALUE: 65 Ohm
Implantable Lead Implant Date: 20160114
Implantable Lead Location: 753859
Implantable Pulse Generator Implant Date: 20160114
Lead Channel Impedance Value: 350 Ohm
Lead Channel Pacing Threshold Amplitude: 0.75 V
Lead Channel Pacing Threshold Pulse Width: 0.5 ms
Lead Channel Setting Pacing Amplitude: 1.75 V
Lead Channel Setting Pacing Amplitude: 2.5 V
Lead Channel Setting Sensing Sensitivity: 0.5 mV
MDC IDC LEAD IMPLANT DT: 20160114
MDC IDC LEAD LOCATION: 753860
MDC IDC MSMT BATTERY VOLTAGE: 2.95 V
MDC IDC MSMT LEADCHNL RA IMPEDANCE VALUE: 380 Ohm
MDC IDC MSMT LEADCHNL RA PACING THRESHOLD PULSEWIDTH: 0.5 ms
MDC IDC MSMT LEADCHNL RA SENSING INTR AMPL: 2.1 mV
MDC IDC MSMT LEADCHNL RV PACING THRESHOLD AMPLITUDE: 1 V
MDC IDC MSMT LEADCHNL RV SENSING INTR AMPL: 8.8 mV
MDC IDC SESS DTM: 20190129090015
MDC IDC SET LEADCHNL RV PACING PULSEWIDTH: 0.5 ms
MDC IDC STAT BRADY AP VP PERCENT: 1 %
MDC IDC STAT BRADY AS VP PERCENT: 1 %
MDC IDC STAT BRADY RA PERCENT PACED: 97 %
Pulse Gen Serial Number: 7241695

## 2017-05-09 ENCOUNTER — Ambulatory Visit
Admission: RE | Admit: 2017-05-09 | Discharge: 2017-05-09 | Disposition: A | Discharge status: Home / Self Care | Payer: Medicare HMO | Source: Ambulatory Visit | Attending: Gastroenterology | Admitting: Gastroenterology

## 2017-05-09 DIAGNOSIS — N281 Cyst of kidney, acquired: Secondary | ICD-10-CM | POA: Insufficient documentation

## 2017-05-09 DIAGNOSIS — K760 Fatty (change of) liver, not elsewhere classified: Secondary | ICD-10-CM | POA: Insufficient documentation

## 2017-05-09 DIAGNOSIS — K8020 Calculus of gallbladder without cholecystitis without obstruction: Secondary | ICD-10-CM | POA: Diagnosis not present

## 2017-06-03 ENCOUNTER — Ambulatory Visit: Payer: Medicare HMO | Admitting: Internal Medicine

## 2017-06-03 ENCOUNTER — Encounter: Payer: Self-pay | Admitting: Internal Medicine

## 2017-06-03 ENCOUNTER — Other Ambulatory Visit: Payer: Self-pay

## 2017-06-03 VITALS — BP 130/88 | HR 60 | Ht 64.0 in | Wt 230.8 lb

## 2017-06-03 DIAGNOSIS — I469 Cardiac arrest, cause unspecified: Secondary | ICD-10-CM | POA: Diagnosis not present

## 2017-06-03 DIAGNOSIS — Z79899 Other long term (current) drug therapy: Secondary | ICD-10-CM

## 2017-06-03 DIAGNOSIS — I480 Paroxysmal atrial fibrillation: Secondary | ICD-10-CM

## 2017-06-03 DIAGNOSIS — I5042 Chronic combined systolic (congestive) and diastolic (congestive) heart failure: Secondary | ICD-10-CM

## 2017-06-03 DIAGNOSIS — Z95810 Presence of automatic (implantable) cardiac defibrillator: Secondary | ICD-10-CM | POA: Diagnosis not present

## 2017-06-03 DIAGNOSIS — I428 Other cardiomyopathies: Secondary | ICD-10-CM

## 2017-06-03 MED ORDER — AMIODARONE HCL 200 MG PO TABS: 200.0000 mg | ORAL_TABLET | Freq: Every day | ORAL | 3 refills | Status: DC

## 2017-06-03 NOTE — Progress Notes (Signed)
Patient Care Team: Jodi Marble, MD as PCP - General (Internal Medicine) Deboraha Sprang, MD as PCP - Cardiology (Cardiology)   HPI  Ethan Vega. is a 72 y.o. male Seen following ICD implantation 1/16 following aborted cardiac arrest. Cardiac evaluation at that time included an echocardiogram demonstrating normal left ventricular function had recovered from the EF of 30-35% range. Catheterization 1/16 demonstrated no significant coronary disease;   We undertook thallium for perfusion defects looking for infiltrative processes  this was abnormal raising the spectre of sarcoid,  Further eval has been unilluminating    CT-highresolution was neg for interstitial/LN   There were small nodules  Eye exam showed apparently no sarcoid  Flecainide challenge was negative ECG  Low Volts R-V1 with RAD with mild RA enlargement SAECG notdone as he was in AFib RVR and rate related RBBB  He has had no atrial fibrillation.  He remains on amiodarone and apixaban and is tolerating the amiodarone without complaints of sensitivity or nausea. NO bleeding on apixaban.  He was hospitalized 12/18 for syncope and was found to be in ventricular tachycardia below his detection rate.  Amiodarone was increased and the device was reprogrammed.  No intercurrent VT  No SOB or chest pain  Chronic  edema  No bleeding    TERF  Stroke-2, age 64, HTN-1 DM -1 for CHADSVAS score >= 5  Date TSH LFTs PFTs  1/17  6.4 31    8/17  4.85 29    5/18 - 27   12/18 6.8 (9/18) 42    PCP just checked TSH was within range ( 3/19)    Date Cr K Hgb  10/17 0.82  14.7  5/18 0.87  13.2  12/18 1.43 4.0 14.3       DATE TEST    1/16 Cath EF 30-35%  CA normal   4/16  Echo   EF 45-50 %   5/16 myoview   EF %  prior infarct?? No ischemia  2/18 ECHO EF 40-45 %            Past Medical History:  Diagnosis Date  . AICD (automatic cardioverter/defibrillator) present 03/31/14   a. 03/2014 s/p SJM 2411-36C dual  chamber AICD (serial Number [id number])- followed by Dr. Caryl Comes  . Arthritis    a. L knee.  . Asthma   . Cancer ([place])    Melanoma resected from scalp  . Cardiac arrest [facility])    a. 03/30/2014 VF arrest in setting of NICM >> CPR/defib in community >> s/p dual chamber AICD  . Coronary artery disease, non-occlusive    a. cath 03/2014 at [facility]: no sig CAD (OM1 30%, CFX 30%), EF 35%  . DM2 (diabetes mellitus, type 2) ([place])    a. Diet controlled.  [place] HLD (hyperlipidemia)   . HTN (hypertension)   . Kidney stones   . NICM (nonischemic cardiomyopathy) ([place])    a. 03/2014 Echo:  Inferolateral and lateral HK, EF 31-49%, grade 1 diastolic dysfunction, mild MR, mild LAE, mild RAE, trivial TR, no effusion.  . Paroxysmal atrial fibrillation ([place]) 03/28/2015  . Retinal artery occlusion     Past Surgical History:  Procedure Laterality Date  . adenomatous polyps    . CARDIAC CATHETERIZATION  03/30/2014  . CARDIOVERSION  03/30/2014  . COLONOSCOPY WITH PROPOFOL N/A 03/24/2017   Procedure: COLONOSCOPY WITH PROPOFOL;  Surgeon: Lollie Sails, MD;  Location: [facility] ENDOSCOPY;  Service: Endoscopy;  Laterality: N/A;  . CYSTOSCOPY  W/ LITHOLAPAXY / EHL    . IMPLANTABLE CARDIOVERTER DEFIBRILLATOR IMPLANT N/A 03/31/2014   Procedure: IMPLANTABLE CARDIOVERTER DEFIBRILLATOR IMPLANT;  Surgeon: Evans Lance, MD;  Location[facility] CATH LAB;  Service: Cardiovascular;  Laterality: N/A;  . JOINT REPLACEMENT    . TONSILLECTOMY     "as a kid"  . TOTAL KNEE ARTHROPLASTY Left ~ 2010  . TOTAL KNEE ARTHROPLASTY WITH REVISION COMPONENTS Left ~ 2011  . VARICOSE VEIN SURGERY Left ~ 2014    Current Outpatient Medications  Medication Sig Dispense Refill  . amiodarone (PACERONE) 200 MG tablet Take 1 tablet (200 mg total) by mouth daily. 90 tablet 3  . apixaban (ELIQUIS) 5 MG TABS tablet Take 1 tablet (5 mg total) by mouth 2 (two) times daily. 180 tablet 3  . atorvastatin (LIPITOR) 10 MG tablet Take 10 mg by mouth daily.    .  budesonide-formoterol (SYMBICORT) 160-4.5 MCG/ACT inhaler Inhale 2 puffs into the lungs daily.    . carvedilol (COREG) 25 MG tablet Take 1 tablet (25 mg total) by mouth 2 (two) times daily with a meal. 180 tablet 3  . Cholecalciferol 1000 units tablet Take 1,000 Units by mouth daily.    . fluticasone (FLONASE) 50 MCG/ACT nasal spray Place into both nostrils daily.    . furosemide (LASIX) 20 MG tablet Take 2 tablets (40 mg) by mouth daily on the even days days of the month (Patient taking differently: Take 20 mg by mouth daily. ) 90 tablet 3  . levothyroxine (SYNTHROID, LEVOTHROID) 75 MCG tablet Take 75 mcg by mouth daily.  2  . losartan (COZAAR) 100 MG tablet Take 1 tablet (100 mg total) by mouth daily. 90 tablet 3  . Multiple Vitamin (MULTIVITAMIN) capsule Take 1 capsule by mouth daily.    . Omega-3 Fatty Acids (FISH OIL) 1200 MG CAPS Take 1,200 mg by mouth daily.      No current facility-administered medications for this visit.     No Known Allergies  Review of Systems negative except from HPI and PMH  Physical Exam BP 130/88 (BP Location: Left Arm, Patient Position: Sitting, Cuff Size: Normal)   Pulse 60   Ht 5\' 4"  (1.626 m)   Wt 230 lb 12 oz (104.7 kg)   BMI 39.61 kg/m  Well developed and nourished in no acute distress HENT normal Neck supple with JVP-flat Clear Device pocket well healed; without hematoma or erythema.  There is no tethering  Regular rate and rhythm, 2/6 sys m and wide S2    Abd-soft with active BS No Clubbing cyanosis 1+edema Skin-warm and dry A & Oriented  Grossly normal sensory and motor function  ECG  Apaced @ 60 25/09/38 Low voltatge   Assessment and  Plan  Aborted Cardiac arrest//FHx Cardiac Arrest   ICD  [employer]   Hypertension  Sinus bradycardia/chronotropic incompetence  Low Voltage ECG  Atrial Fibrillation   Retinal artery occlusion  Congestive heart failure-chronic-systolic/diastolic  Tolerating amio  Labs ok  No intercurrent  Ventricular tachycardia  Continue amiodarone  TSH apparently normal with pcp  On Anticoagulation;  No bleeding issues   BP well controlled    We spent more than 50% of our >25 min visit in face to face counseling regarding the above

## 2017-06-03 NOTE — Patient Instructions (Addendum)
Medication Instructions: - Your physician recommends that you continue on your current medications as directed. Please refer to the Current Medication list given to you today.  Labwork: - none ordered  Procedures/Testing: - none ordered  Follow-Up: - Remote monitoring is used to monitor your Pacemaker of ICD from home. This monitoring reduces the number of office visits required to check your device to one time per year. It allows [place] to keep an eye on the functioning of your device to ensure it is working properly. You are scheduled for a device check from home on 07/15/17. You may send your transmission at any time that day. If you have a wireless device, the transmission will be sent automatically. After your physician reviews your transmission, you will receive a postcard with your next transmission date.  - Your physician wants you to follow-up in: 6 months with Dr. Caryl Comes. You will receive a reminder letter in the mail two months in advance. If you don't receive a letter, please call our office to schedule the follow-up appointment.   Any Additional Special Instructions Will Be Listed Below (If Applicable).     If you need a refill on your cardiac medications before your next appointment, please call your pharmacy.

## 2017-06-10 ENCOUNTER — Encounter: Payer: Medicare HMO | Admitting: Internal Medicine

## 2017-06-12 ENCOUNTER — Encounter: Payer: Medicare Other | Admitting: Internal Medicine

## 2017-07-15 ENCOUNTER — Ambulatory Visit (INDEPENDENT_AMBULATORY_CARE_PROVIDER_SITE_OTHER): Payer: Medicare HMO | Admitting: *Deleted

## 2017-07-15 DIAGNOSIS — R9431 Abnormal electrocardiogram [ECG] [EKG]: Secondary | ICD-10-CM

## 2017-07-15 NOTE — Progress Notes (Signed)
Remote ICD transmission.   

## 2017-07-16 ENCOUNTER — Encounter: Payer: Self-pay | Admitting: Cardiology

## 2017-07-28 ENCOUNTER — Telehealth: Payer: Self-pay | Admitting: Internal Medicine

## 2017-07-28 NOTE — Telephone Encounter (Signed)
S/w patient. His complaint is that he's been coughing and having lots of nasal drainage. He's been blowing his nose a lot as well. He would like to know what he should take for this. Advised patient he could use Coricidin HBP as well as speak to his pharmacist about drugs that would not interfere with his medications. I also encouraged patient to reach out to his PCP. He said he already called there and they did not have any appointments. He verbalized understanding of instructions and to call PCP again if needed.

## 2017-07-28 NOTE — Telephone Encounter (Signed)
Pt c/o Shortness Of Breath: STAT if SOB developed within the last 24 hours or pt is noticeably SOB on the phone  1. Are you currently SOB (can you hear that pt is SOB on the phone)? yes  2. How long have you been experiencing SOB? 07/25/2017  3. Are you SOB when sitting or when up moving around? both  4. Are you currently experiencing any other symptoms? Pt has a lot of drainage, he stated that he has a terrible cold and that he dont know what he can take over the counter

## 2017-07-30 ENCOUNTER — Encounter: Payer: Self-pay | Admitting: Emergency Medicine

## 2017-07-30 ENCOUNTER — Emergency Department: Payer: Medicare HMO

## 2017-07-30 ENCOUNTER — Emergency Department
Admission: EM | Admit: 2017-07-30 | Discharge: 2017-07-30 | Disposition: A | Discharge status: Home / Self Care | Payer: Medicare HMO | Attending: Emergency Medicine | Admitting: Emergency Medicine

## 2017-07-30 ENCOUNTER — Other Ambulatory Visit: Payer: Self-pay

## 2017-07-30 ENCOUNTER — Telehealth: Payer: Self-pay | Admitting: Internal Medicine

## 2017-07-30 DIAGNOSIS — I110 Hypertensive heart disease with heart failure: Secondary | ICD-10-CM | POA: Diagnosis not present

## 2017-07-30 DIAGNOSIS — Z79899 Other long term (current) drug therapy: Secondary | ICD-10-CM | POA: Diagnosis not present

## 2017-07-30 DIAGNOSIS — Z7722 Contact with and (suspected) exposure to environmental tobacco smoke (acute) (chronic): Secondary | ICD-10-CM | POA: Diagnosis not present

## 2017-07-30 DIAGNOSIS — R05 Cough: Secondary | ICD-10-CM

## 2017-07-30 DIAGNOSIS — I2510 Atherosclerotic heart disease of native coronary artery without angina pectoris: Secondary | ICD-10-CM | POA: Insufficient documentation

## 2017-07-30 DIAGNOSIS — Z96652 Presence of left artificial knee joint: Secondary | ICD-10-CM | POA: Insufficient documentation

## 2017-07-30 DIAGNOSIS — J45909 Unspecified asthma, uncomplicated: Secondary | ICD-10-CM | POA: Insufficient documentation

## 2017-07-30 DIAGNOSIS — I509 Heart failure, unspecified: Secondary | ICD-10-CM | POA: Insufficient documentation

## 2017-07-30 DIAGNOSIS — R059 Cough, unspecified: Secondary | ICD-10-CM

## 2017-07-30 DIAGNOSIS — R0602 Shortness of breath: Secondary | ICD-10-CM | POA: Diagnosis not present

## 2017-07-30 LAB — CBC WITH DIFFERENTIAL/PLATELET
BASOS PCT: 0 %
Basophils Absolute: 0 10*3/uL (ref 0–0.1)
Eosinophils Absolute: 0.2 10*3/uL (ref 0–0.7)
Eosinophils Relative: 3 %
HCT: 41.3 % (ref 40.0–52.0)
Hemoglobin: 14.2 g/dL (ref 13.0–18.0)
Lymphocytes Relative: 9 %
Lymphs Abs: 0.7 10*3/uL — ABNORMAL LOW (ref 1.0–3.6)
MCH: 32.2 pg (ref 26.0–34.0)
MCHC: 34.5 g/dL (ref 32.0–36.0)
MCV: 93.4 fL (ref 80.0–100.0)
MONO ABS: 0.7 10*3/uL (ref 0.2–1.0)
MONOS PCT: 9 %
NEUTROS ABS: 6.3 10*3/uL (ref 1.4–6.5)
Neutrophils Relative %: 79 %
Platelets: 159 10*3/uL (ref 150–440)
RBC: 4.43 MIL/uL (ref 4.40–5.90)
RDW: 14.6 % — AB (ref 11.5–14.5)
WBC: 8 10*3/uL (ref 3.8–10.6)

## 2017-07-30 LAB — BASIC METABOLIC PANEL
Anion gap: 10 (ref 5–15)
BUN: 14 mg/dL (ref 6–20)
CALCIUM: 8.8 mg/dL — AB (ref 8.9–10.3)
CO2: 24 mmol/L (ref 22–32)
CREATININE: 0.83 mg/dL (ref 0.61–1.24)
Chloride: 103 mmol/L (ref 101–111)
GFR calc non Af Amer: >60 mL/min (ref >60)
GLUCOSE: 113 mg/dL — AB (ref 65–99)
Potassium: 4 mmol/L (ref 3.5–5.1)
Sodium: 137 mmol/L (ref 135–145)

## 2017-07-30 LAB — TROPONIN I
Troponin I: 0.03 ng/mL (ref <0.03)
Troponin I: 0.04 ng/mL (ref <0.03)

## 2017-07-30 LAB — BRAIN NATRIURETIC PEPTIDE: B Natriuretic Peptide: 529 pg/mL — ABNORMAL HIGH (ref 0.0–100.0)

## 2017-07-30 MED ORDER — FUROSEMIDE 10 MG/ML IJ SOLN
40.0000 mg | Freq: Once | INTRAMUSCULAR | Status: AC
  Administered 2017-07-30: 40 mg via INTRAVENOUS
  Filled 2017-07-30: qty 4

## 2017-07-30 MED ORDER — ALBUTEROL SULFATE HFA 108 (90 BASE) MCG/ACT IN AERS: 2.0000 | INHALATION_SPRAY | Freq: Four times a day (QID) | RESPIRATORY_TRACT | 2 refills | Status: DC | PRN

## 2017-07-30 MED ORDER — IPRATROPIUM-ALBUTEROL 0.5-2.5 (3) MG/3ML IN SOLN
3.0000 mL | Freq: Once | RESPIRATORY_TRACT | Status: AC
  Administered 2017-07-30: 3 mL via RESPIRATORY_TRACT
  Filled 2017-07-30: qty 3

## 2017-07-30 NOTE — ED Provider Notes (Signed)
[facility] Emergency Department Provider Note  ____________________________________________  Time seen: Approximately 9:35 AM  I have reviewed the triage vital signs and the nursing notes.   HISTORY  Chief Complaint Cough and Shortness of Breath   HPI Armani Brar. is a 72 y.o. male with a history of asthma, nonischemic cardiomyopathy, cardiac arrest status post AICD, hypertension, hyperlipidemia, paroxysmal atrial fibrillation who presents for evaluation of shortness of breath.  Patient reports 1 week of cough productive of yellow sputum. for the last 5 days has been having progressively worsening shortness of breath.  Patient reports that he has been unable to lay flat for the last 3 nights and has been sleeping on a chair due to severe orthopnea.  He also endorses weight gain, reports significant swelling of his bilateral lower extremities and this morning had a difficult time putting his shoes on due to the swelling.  He denies fever or chills.  He denies chest pain.  His shortness of breath is now constant and severe, worse when laying flat and better with sitting up.  Past Medical History:  Diagnosis Date  . AICD (automatic cardioverter/defibrillator) present 03/31/14   a. 03/2014 s/p SJM 2411-36C dual chamber AICD (serial Number [id number])- followed by Dr. Caryl Comes  . Arthritis    a. L knee.  . Asthma   . Cancer ([place])    Melanoma resected from scalp  . Cardiac arrest [facility])    a. 03/30/2014 VF arrest in setting of NICM >> CPR/defib in community >> s/p dual chamber AICD  . Coronary artery disease, non-occlusive    a. cath 03/2014 at [facility]: no sig CAD (OM1 30%, CFX 30%), EF 35%  . DM2 (diabetes mellitus, type 2) ([place])    a. Diet controlled.  Marland Kitchen HLD (hyperlipidemia)   . HTN (hypertension)   . Kidney stones   . NICM (nonischemic cardiomyopathy) ([place])    a. 03/2014 Echo:  Inferolateral and lateral HK, EF 08-65%, grade 1 diastolic dysfunction, mild MR, mild  LAE, mild RAE, trivial TR, no effusion.  . Paroxysmal atrial fibrillation ([place]) 03/28/2015  . Retinal artery occlusion     Patient Active Problem List   Diagnosis Date Noted  . Ventricular tachycardia ([place]) 02/16/2017  . Paroxysmal atrial fibrillation ([place]) 03/28/2015  . Leg swelling 07/27/2014  . NICM (nonischemic cardiomyopathy) ([place]) 04/03/2014  . HTN (hypertension) 04/03/2014  . S/P implantation of automatic cardioverter/defibrillator (AICD) 04/03/2014  . Prolonged Q-T interval on ECG 04/03/2014  . Encounter for monitoring amiodarone therapy 04/03/2014  . Sinus node dysfunction/bradycardia 04/01/2014  . Elevated transaminase level 04/01/2014  . Cardiac arrest ([place]) 03/30/2014    Past Surgical History:  Procedure Laterality Date  . adenomatous polyps    . CARDIAC CATHETERIZATION  03/30/2014  . CARDIOVERSION  03/30/2014  . COLONOSCOPY WITH PROPOFOL N/A 03/24/2017   Procedure: COLONOSCOPY WITH PROPOFOL;  Surgeon: Lollie Sails, MD;  Location: [facility] ENDOSCOPY;  Service: Endoscopy;  Laterality: N/A;  . CYSTOSCOPY W/ LITHOLAPAXY / EHL    . IMPLANTABLE CARDIOVERTER DEFIBRILLATOR IMPLANT N/A 03/31/2014   Procedure: IMPLANTABLE CARDIOVERTER DEFIBRILLATOR IMPLANT;  Surgeon: Evans Lance, MD;  Location: [facility] CATH LAB;  Service: Cardiovascular;  Laterality: N/A;  . JOINT REPLACEMENT    . TONSILLECTOMY     "as a kid"  . TOTAL KNEE ARTHROPLASTY Left ~ 2010  . TOTAL KNEE ARTHROPLASTY WITH REVISION COMPONENTS Left ~ 2011  . VARICOSE VEIN SURGERY Left ~ 2014    Prior to Admission medications  Medication Sig Start Date End Date Taking? Authorizing Provider  amiodarone (PACERONE) 200 MG tablet Take 1 tablet (200 mg total) by mouth daily. 06/03/17  Yes Deboraha Sprang, MD  apixaban (ELIQUIS) 5 MG TABS tablet Take 1 tablet (5 mg total) by mouth 2 (two) times daily. 03/25/17  Yes Deboraha Sprang, MD  atorvastatin (LIPITOR) 10 MG tablet Take 10 mg by mouth daily.   Yes [provider]    budesonide-formoterol (SYMBICORT) 160-4.5 MCG/ACT inhaler Inhale 2 puffs into the lungs daily.   Yes [provider]  carvedilol (COREG) 25 MG tablet Take 1 tablet (25 mg total) by mouth 2 (two) times daily with a meal. 03/13/17  Yes Deboraha Sprang, MD  Cholecalciferol 1000 units tablet Take 1,000 Units by mouth daily.   Yes [provider]  furosemide (LASIX) 20 MG tablet Take 2 tablets (40 mg) by mouth daily on the even days days of the month Patient taking differently: Take 20 mg by mouth every other day.  12/03/16  Yes Deboraha Sprang, MD  levothyroxine (SYNTHROID, LEVOTHROID) 75 MCG tablet Take 75 mcg by mouth daily. 02/19/17  Yes [provider]  losartan (COZAAR) 100 MG tablet Take 1 tablet (100 mg total) by mouth daily. 04/25/17  Yes Deboraha Sprang, MD  Multiple Vitamin (MULTIVITAMIN) capsule Take 1 capsule by mouth daily.   Yes [provider]  Omega-3 Fatty Acids (FISH OIL) 1000 MG CAPS Take 1,000 mg by mouth daily.    Yes [provider]    Allergies Patient has no known allergies.  Family History  Problem Relation Age of Onset  . Esophageal cancer Mother        died at 37  . Heart attack Father        died at 24  . Hypertension Brother     Social History Social History   Tobacco Use  . Smoking status: Passive Smoke Exposure - Never Smoker  . Smokeless tobacco: Never Used  Substance Use Topics  . Alcohol use: No  . Drug use: No    Review of Systems  Constitutional: Negative for fever. + weight gain Eyes: Negative for visual changes. ENT: Negative for sore throat. Neck: No neck pain  Cardiovascular: Negative for chest pain. + orthopnea Respiratory: + shortness of breath, cough Gastrointestinal: Negative for abdominal pain, vomiting or diarrhea. Genitourinary: Negative for dysuria. Musculoskeletal: Negative for back pain. Skin: Negative for rash. Neurological: Negative for headaches, weakness or numbness. Psych: No  SI or HI  ____________________________________________   PHYSICAL EXAM:  VITAL SIGNS: ED Triage Vitals  Enc Vitals Group     BP 07/30/17 0817 (!) 174/93     Pulse Rate 07/30/17 0817 69     Resp 07/30/17 0817 (!) 22     Temp 07/30/17 0817 98.2 F (36.8 C)     Temp Source 07/30/17 0817 Oral     SpO2 07/30/17 0817 95 %     Weight 07/30/17 0823 230 lb (104.3 kg)     Height 07/30/17 0823 5\' 6"  (1.676 m)     Head Circumference --      Peak Flow --      Pain Score 07/30/17 0822 0     Pain Loc --      Pain Edu? --      Excl. in [place]? --     Constitutional: Alert and oriented. Well appearing and in no apparent distress. HEENT:      Head: Normocephalic and atraumatic.  Eyes: Conjunctivae are normal. Sclera is non-icteric.       Mouth/Throat: Mucous membranes are moist.       Neck: Supple with no signs of meningismus. Cardiovascular: Regular rate and rhythm. No murmurs, gallops, or rubs. 2+ symmetrical distal pulses are present in all extremities. Respiratory: Tachypneic, normal sats on room air, coarse rhonchi bilaterally  Gastrointestinal: Soft, non tender, and non distended with positive bowel sounds. No rebound or guarding. Musculoskeletal: 1+ pitting edema bilateral lower extremities  neurologic: Normal speech and language. Face is symmetric. Moving all extremities. No gross focal neurologic deficits are appreciated. Skin: Skin is warm, dry and intact. No rash noted. Psychiatric: Mood and affect are normal. Speech and behavior are normal.  ____________________________________________   LABS (all labs ordered are listed, but only abnormal results are displayed)  Labs Reviewed  CBC WITH DIFFERENTIAL/PLATELET - Abnormal; Notable for the following components:      Result Value   RDW 14.6 (*)    Lymphs Abs 0.7 (*)    All other components within normal limits  BASIC METABOLIC PANEL - Abnormal; Notable for the following components:   Glucose, Bld 113 (*)    Calcium 8.8  (*)    All other components within normal limits  BRAIN NATRIURETIC PEPTIDE - Abnormal; Notable for the following components:   B Natriuretic Peptide 529.0 (*)    All other components within normal limits  TROPONIN I - Abnormal; Notable for the following components:   Troponin I 0.03 (*)    All other components within normal limits  TROPONIN I - Abnormal; Notable for the following components:   Troponin I 0.04 (*)    All other components within normal limits   ____________________________________________  EKG  ED ECG REPORT I, Rudene Re, the attending physician, personally viewed and interpreted this ECG.  Atrial paced rhythm, rate of 66, normal intervals, right axis deviation, low QRS, no concordant ST elevation.  Unchanged from prior from March 2015 ____________________________________________  RADIOLOGY  I have personally reviewed the images performed during this visit and I agree with the Radiologist's read.   Interpretation by Radiologist:  Dg Chest 2 View  Result Date: 07/30/2017 CLINICAL DATA:  Cough and shortness of breath for several days EXAM: CHEST - 2 VIEW COMPARISON:  02/16/2017 FINDINGS: Cardiac shadow is mildly enlarged but stable. Defibrillator is again seen and stable. The lungs are well aerated bilaterally. Mild vascular congestion and mild interstitial edema is seen. No sizable effusion is noted. No focal infiltrate is seen. No bony abnormality is noted. IMPRESSION: Changes of mild CHF. Electronically Signed   By: Inez Catalina M.D.   On: 07/30/2017 09:45      ____________________________________________   PROCEDURES  Procedure(s) performed: None Procedures Critical Care performed:  None ____________________________________________   INITIAL IMPRESSION / ASSESSMENT AND PLAN / ED COURSE   72 y.o. male with a history of asthma, nonischemic cardiomyopathy, cardiac arrest status post AICD, hypertension, hyperlipidemia, paroxysmal atrial  fibrillation who presents for evaluation of shortness of breath, cough, orthopnea, weight gain.  Patient looks volume overloaded with coarse crackles and pitting edema.  EKG showing no evidence of ischemia or arrhythmia. DDX CHF exacerbation, PNA. Less likely PE with no risk factors, no hypoxia, no CP. Plan for CXR, labs.    _________________________ 1:48 PM on 07/30/2017 -----------------------------------------  Work-up consistent with mild CHF exacerbation.  Patient received 40 mg of IV Lasix and diuresed about 800 cc of urine.  He reports improvement in his respiratory status.  He  was able to ambulate around the entire department and his sats were maintained at 95%.  The remaining of his vitals were within normal limits including respiratory rate and heart rate.  At this time I will increase his Lasix.  Currently he takes 40 mg on even days and nothing on all days.  Will make his regimen 40 on even days and a 20 on all days for 3 days until he is able to follow-up with his cardiologist for further evaluation.  Patient also reports improvement of his cough after 1 DuoNeb therefore we will give him a albuterol inhaler especially since he has a history of asthma.  Discussed strict return precautions with patient.  He is comfortable with the plan.   As part of my medical decision making, I reviewed the following data within the [place] notes reviewed and incorporated, Labs reviewed , EKG interpreted , Old EKG reviewed, Old chart reviewed, Radiograph reviewed  Notes from prior ED visits and [place] Controlled Substance Database    Pertinent labs & imaging results that were available during my care of the patient were reviewed by me and considered in my medical decision making (see chart for details).    ____________________________________________   FINAL CLINICAL IMPRESSION(S) / ED DIAGNOSES  Final diagnoses:  Acute on chronic congestive heart failure, unspecified heart  failure type ([place])  Cough      NEW MEDICATIONS STARTED DURING THIS VISIT:  ED Discharge Orders    None       Note:  This document was prepared using Dragon voice recognition software and may include unintentional dictation errors.    Alfred Levins, [place], MD 07/30/17 [id number]

## 2017-07-30 NOTE — ED Triage Notes (Signed)
Pt here with c/o cough and shob since last Friday, appears in no distress at this time, states he has been coughing until he gets a headache.

## 2017-07-30 NOTE — Telephone Encounter (Signed)
Patient came by office   Pt c/o Shortness Of Breath: STAT if SOB developed within the last 24 hours or pt is noticeably SOB on the phone  1. Are you currently SOB (can you hear that pt is SOB on the phone)? Yes  2. How long have you been experiencing SOB? 07/25/17 - Friday evening   3. Are you SOB when sitting or when up moving around? Both  4. Are you currently experiencing any other symptoms? Lots of coughing, sore, swollen ankles, wheezing

## 2017-07-30 NOTE — ED Notes (Signed)
First Nurse Note:  Patient to ED from [facility] via [place].  Pulse ox 95%, speaking in full sentences.

## 2017-07-30 NOTE — Telephone Encounter (Signed)
Patient came into office with visible shortness of breath, wheezing, complaints that he has swelling and just does not feel well. Oxygen saturation was 95% and heart rate in the 70's. Advised patient that there are no providers in the office and that he should be evaluated in the ED. He verbalized hesitation about that stating that it will cost too much and that maybe he could go to urgent care. Reviewed that based on his reported symptoms he should go to ED. He verbalized understanding and transported him to ED via wheelchair.

## 2017-07-30 NOTE — Discharge Instructions (Signed)
Continue to take 2 tablets of Lasix on even days and start taking 1 tablet of Lasix on the odd days starting tomorrow.  Follow-up with your cardiologist in 1 to 2 days for reevaluation.  You can use the albuterol inhaler given to you 2 puffs every 4 hours as needed for severe cough or shortness of breath.  Return to the emergency room if you have worsening shortness of breath, chest pain, or any other symptoms that are concerning to you.

## 2017-07-30 NOTE — ED Notes (Signed)
Pt oob in hall. o2 sats 95% hr 77 at start. Lowest readings while walking 94% sats and 85 hr

## 2017-07-31 ENCOUNTER — Encounter: Payer: Self-pay | Admitting: Internal Medicine

## 2017-07-31 ENCOUNTER — Ambulatory Visit: Payer: Medicare HMO | Admitting: Internal Medicine

## 2017-07-31 VITALS — BP 100/58 | HR 60 | Ht 66.0 in | Wt 226.5 lb

## 2017-07-31 DIAGNOSIS — J45909 Unspecified asthma, uncomplicated: Secondary | ICD-10-CM | POA: Diagnosis not present

## 2017-07-31 DIAGNOSIS — I428 Other cardiomyopathies: Secondary | ICD-10-CM

## 2017-07-31 DIAGNOSIS — Z95810 Presence of automatic (implantable) cardiac defibrillator: Secondary | ICD-10-CM

## 2017-07-31 DIAGNOSIS — I5042 Chronic combined systolic (congestive) and diastolic (congestive) heart failure: Secondary | ICD-10-CM | POA: Diagnosis not present

## 2017-07-31 DIAGNOSIS — I469 Cardiac arrest, cause unspecified: Secondary | ICD-10-CM

## 2017-07-31 MED ORDER — FUROSEMIDE 40 MG PO TABS: ORAL_TABLET | ORAL | 3 refills | Status: DC

## 2017-07-31 MED ORDER — PREDNISONE 10 MG (21) PO TBPK: ORAL_TABLET | ORAL | 0 refills | Status: DC

## 2017-07-31 NOTE — Progress Notes (Signed)
Patient Care Team: Jodi Marble, MD as PCP - General (Internal Medicine) Deboraha Sprang, MD as PCP - Cardiology (Cardiology)   HPI  Ethan Furnace Markee Matera. is a 72 y.o. male Seen following ICD implantation 1/16 following aborted cardiac arrest. Cardiac evaluation at that time included an echocardiogram demonstrating normal left ventricular function had recovered from the EF of 30-35% range. Catheterization 1/16 demonstrated no significant coronary disease;   We undertook thallium for perfusion defects looking for infiltrative processes  this was abnormal raising the spectre of sarcoid,    CT-high resolution was neg for interstitial/LN   There were small nodules  Eye exam showed apparently no sarcoid  Flecainide challenge was negative ECG  Low Volts R-V1 with RAD with mild RA enlargement SAECG notdone as he was in AFib RVR and rate related RBBB  He has had no atrial fibrillation.  He remains on amiodarone and apixaban and is tolerating the amiodarone without complaints of sensitivity or nausea. NO bleeding on apixaban.  He was hospitalized 12/18 for syncope and was found to be in ventricular tachycardia below his detection rate.  Amiodarone was increased and the device was reprogrammed.  He was seen yesterday in the emergency room for acute on chronic congestive heart failure.  Chest x-ray demonstrated infiltrate; BNP was elevated.  ECG showed sinus rhythm  He has been having increasing orthopnea and nocturnal dyspnea and coughing.  Also noted increasing peripheral edema.  Denies fevers.  He has a history of reactive airways disease for which she has been treated by his PCP with a Symbicort inhaler.  In the emergency room he was given an albuterol rescue inhaler.   TERF  Stroke-2, age 67, HTN-1 DM -1 for CHADSVAS score >= 5                 Date Cr K Hgb TSH LFTs PFTs  1/17     6.4 31    8/17  0.82  14.4 4.85 29    5/18 0.87  13.2 - 27   12/18 1.43 4.0 14.3 2.36 42   5/19  0.83 4.0 14.2              PCP just checked TSH was within range ( 3/19)     DATE TEST    1/16 Cath EF 30-35%  CA normal   4/16  Echo   EF 45-50 %   5/16 myoview   EF %  prior infarct?? No ischemia  2/18 ECHO EF 40-45 %            Past Medical History:  Diagnosis Date  . AICD (automatic cardioverter/defibrillator) present 03/31/14   a. 03/2014 s/p SJM 2411-36C dual chamber AICD (serial Number [id number])- followed by Dr. Caryl Comes  . Arthritis    a. L knee.  . Asthma   . Cancer ([place])    Melanoma resected from scalp  . Cardiac arrest [facility])    a. 03/30/2014 VF arrest in setting of NICM >> CPR/defib in community >> s/p dual chamber AICD  . Coronary artery disease, non-occlusive    a. cath 03/2014 at [facility]: no sig CAD (OM1 30%, CFX 30%), EF 35%  . DM2 (diabetes mellitus, type 2) ([place])    a. Diet controlled.  Marland Kitchen HLD (hyperlipidemia)   . HTN (hypertension)   . Kidney stones   . NICM (nonischemic cardiomyopathy) ([place])    a. 03/2014 Echo:  Inferolateral and lateral HK, EF 66-44%, grade 1 diastolic dysfunction,  mild MR, mild LAE, mild RAE, trivial TR, no effusion.  . Paroxysmal atrial fibrillation ([place]) 03/28/2015  . Retinal artery occlusion     Past Surgical History:  Procedure Laterality Date  . adenomatous polyps    . CARDIAC CATHETERIZATION  03/30/2014  . CARDIOVERSION  03/30/2014  . COLONOSCOPY WITH PROPOFOL N/A 03/24/2017   Procedure: COLONOSCOPY WITH PROPOFOL;  Surgeon: Lollie Sails, MD;  Location: [facility] ENDOSCOPY;  Service: Endoscopy;  Laterality: N/A;  . CYSTOSCOPY W/ LITHOLAPAXY / EHL    . IMPLANTABLE CARDIOVERTER DEFIBRILLATOR IMPLANT N/A 03/31/2014   Procedure: IMPLANTABLE CARDIOVERTER DEFIBRILLATOR IMPLANT;  Surgeon: Evans Lance, MD;  Location: [facility] CATH LAB;  Service: Cardiovascular;  Laterality: N/A;  . JOINT REPLACEMENT    . TONSILLECTOMY     "as a kid"  . TOTAL KNEE ARTHROPLASTY Left ~ 2010  . TOTAL KNEE ARTHROPLASTY WITH REVISION COMPONENTS Left ~ 2011  . VARICOSE  VEIN SURGERY Left ~ 2014    Current Outpatient Medications  Medication Sig Dispense Refill  . albuterol (PROVENTIL HFA;VENTOLIN HFA) 108 (90 Base) MCG/ACT inhaler Inhale 2 puffs into the lungs every 6 (six) hours as needed for wheezing or shortness of breath. 1 Inhaler 2  . amiodarone (PACERONE) 200 MG tablet Take 1 tablet (200 mg total) by mouth daily. 90 tablet 3  . apixaban (ELIQUIS) 5 MG TABS tablet Take 1 tablet (5 mg total) by mouth 2 (two) times daily. 180 tablet 3  . atorvastatin (LIPITOR) 10 MG tablet Take 10 mg by mouth daily.    . budesonide-formoterol (SYMBICORT) 160-4.5 MCG/ACT inhaler Inhale 2 puffs into the lungs daily.    . carvedilol (COREG) 25 MG tablet Take 1 tablet (25 mg total) by mouth 2 (two) times daily with a meal. 180 tablet 3  . Cholecalciferol 1000 units tablet Take 1,000 Units by mouth daily.    . furosemide (LASIX) 20 MG tablet Take 2 tablets (40 mg) by mouth daily on the even days days of the month (Patient taking differently: Take 20 mg by mouth every other day. ) 90 tablet 3  . levothyroxine (SYNTHROID, LEVOTHROID) 75 MCG tablet Take 75 mcg by mouth daily.  2  . losartan (COZAAR) 100 MG tablet Take 1 tablet (100 mg total) by mouth daily. 90 tablet 3  . Multiple Vitamin (MULTIVITAMIN) capsule Take 1 capsule by mouth daily.    . Omega-3 Fatty Acids (FISH OIL) 1000 MG CAPS Take 1,000 mg by mouth daily.      No current facility-administered medications for this visit.     No Known Allergies  Review of Systems negative except from HPI and PMH  Physical Exam BP (!) 100/58 (BP Location: Left Arm, Patient Position: Sitting, Cuff Size: Normal)   Pulse 60   Ht 5\' 6"  (1.676 m)   Wt 226 lb 8 oz (102.7 kg)   BMI 36.56 kg/m  Well developed and nourished in no acute distress HENT normal Neck supple with JVP 8 +HJR B wheezes Regular rate and rhythm, no murmurs or gallops Abd-soft with active BS No Clubbing cyanosis 2+edema Skin-warm and dry A & Oriented   Grossly normal sensory and motor function  ECG apaced 60 22/10/46  Assessment and  Plan  Aborted Cardiac arrest//FHx Cardiac Arrest   ICD  St Jude   Hypertension  Sinus bradycardia/chronotropic incompetence  Low Voltage ECG  Atrial Fibrillation   Retinal artery occlusion  Congestive heart failure-acute chronic-systolic/diastolic  Reactive Airways disease   The patient was seen yesterday in the  emergency room with acute on chronic shortness of breath consistent with congestive heart failure but sounds like it had a reactive airways component to it as well.  He is better following diuresis and nebulizer therapy.  No clear trigger; he has had a productive cough but his white count was normal.  I have discussed with pulmonary; we will continue him on his Symbicort and uses albuterol as rescue.  I will take the liberty of giving him a prednisone Dosepak  We will increase his diuretics to 80 alternating daily with 40.  We will need to check a metabolic profile in a couple of weeks.  He has had some tendency to worsen his renal function.  Will refer to pulmonary.  More than 50% of 40  min was spent in counseling related to the above

## 2017-07-31 NOTE — Patient Instructions (Addendum)
Medication Instructions: - Your physician has recommended you make the following change in your medication:  1) START prednisone dose pack- as directed 2) Increase lasix (furosemide) 40 mg- take 2 tablets (80 mg) once every other day alternating with 1 tablet (40 mg) once every other day  Labwork: - none ordered  Procedures/Testing: - none ordered  Follow-Up: - You have been referred to [facility]- for asthma  - Your physician recommends that you schedule a follow-up appointment in: 2-3 weeks with Gerald Stabs, NP/ [place], [place] (you will need lab work that day- BMP)   Any Additional Special Instructions Will Be Listed Below (If Applicable). Weigh yourself daily- in the morning before you eat- urinate prior to getting on the scale.  Call the office if your weight: - goes up more than 3 lbs in 24 hours or 5 lbs in 1 week     If you need a refill on your cardiac medications before your next appointment, please call your pharmacy.

## 2017-08-04 NOTE — Progress Notes (Signed)
[facility] Consultation      Assessment and Plan:  Dyspnea on exertion, multifactorial due to CHF, asthma, chronic pulmonary edema.  --continue lasix, follow up with cardiology.   Severe persistent asthma with daily symptoms.  --Currently having trouble affording symbicort, will change to advair which appears to be Tier 1.  --Start singulair.  --Continue albuterol as needed.    Excessive daytime sleepiness. - Symptoms and signs of obstructive sleep apnea. -We will send for sleep study.  Meds ordered this encounter  Medications  . Fluticasone-Salmeterol (ADVAIR DISKUS) 250-50 MCG/DOSE AEPB    Sig: Inhale 1 puff into the lungs 2 (two) times daily. Rinse mouth after use.    Dispense:  1 each    Refill:  5  . montelukast (SINGULAIR) 10 MG tablet    Sig: Take 1 tablet (10 mg total) by mouth at bedtime.    Dispense:  30 tablet    Refill:  5   Orders Placed This Encounter  Procedures  . Split night study   Return in about 3 months (around 11/05/2017).    Date: 08/05/2017  MRN# [id number] Ethan Vega 1945-04-05    Ethan Vega. is a 72 y.o. old male seen in consultation for chief complaint of:    Chief Complaint  Patient presents with  . Consult    Referred by  Dr. Caryl Comes for eval asthma. Pt seen 5/16 and given prednisone taper and his lasix was increased to 80 mg bid EOD.and 40 mg on odd day.  . Cough    which is a little more productive ( thick clear mucus)  . Wheezing    uses Albuterol inhaler tid  . Shortness of Breath    improved but still with exertion    HPI:   The patient is a 72 year old male with a history of cardiac arrest, chronic systolic/diastolic congestive heart failure.  He also has a history of reactive airways disease, and started on Symbicort inhaler. He has constant sinus drainage, and noticing that his breathing has been getting progressively worse. His breathing improved when his lasix was increased. He also uses  albuterol when he is wheezing and notices that it helps him a lot. He is using symbicort 2-3 times per week.   Denies reflux. He has a dog in bedroom. He sleeps in a recliner.  He is sleepy during the day and takes a nap during the day. He has been tested for OSA, which was positive but he did not believe it. They are planning on repeating this.   Imaging personally reviewed chest x-ray 07/30/2017, elevated right diaphragm, interstitial edema.  CT chest 10/12/2014 lungs are unremarkable. **Abs eos as high as 400.   PMHX:   Past Medical History:  Diagnosis Date  . AICD (automatic cardioverter/defibrillator) present 03/31/14   a. 03/2014 s/p SJM 2411-36C dual chamber AICD (serial Number [id number])- followed by Dr. Caryl Comes  . Arthritis    a. L knee.  . Asthma   . Cancer ([place])    Melanoma resected from scalp  . Cardiac arrest [facility])    a. 03/30/2014 VF arrest in setting of NICM >> CPR/defib in community >> s/p dual chamber AICD  . Coronary artery disease, non-occlusive    a. cath 03/2014 at [facility]: no sig CAD (OM1 30%, CFX 30%), EF 35%  . DM2 (diabetes mellitus, type 2) ([place])    a. Diet controlled.  Marland Kitchen HLD (hyperlipidemia)   . HTN (hypertension)   . Kidney stones   .  NICM (nonischemic cardiomyopathy) ([place])    a. 03/2014 Echo:  Inferolateral and lateral HK, EF 10-27%, grade 1 diastolic dysfunction, mild MR, mild LAE, mild RAE, trivial TR, no effusion.  . Paroxysmal atrial fibrillation ([place]) 03/28/2015  . Retinal artery occlusion    Surgical Hx:  Past Surgical History:  Procedure Laterality Date  . adenomatous polyps    . CARDIAC CATHETERIZATION  03/30/2014  . CARDIOVERSION  03/30/2014  . COLONOSCOPY WITH PROPOFOL N/A 03/24/2017   Procedure: COLONOSCOPY WITH PROPOFOL;  Surgeon: Lollie Sails, MD;  Location: [facility] ENDOSCOPY;  Service: Endoscopy;  Laterality: N/A;  . CYSTOSCOPY W/ LITHOLAPAXY / EHL    . IMPLANTABLE CARDIOVERTER DEFIBRILLATOR IMPLANT N/A 03/31/2014   Procedure: IMPLANTABLE  CARDIOVERTER DEFIBRILLATOR IMPLANT;  Surgeon: Evans Lance, MD;  Location: [facility] CATH LAB;  Service: Cardiovascular;  Laterality: N/A;  . JOINT REPLACEMENT    . TONSILLECTOMY     "as a kid"  . TOTAL KNEE ARTHROPLASTY Left ~ 2010  . TOTAL KNEE ARTHROPLASTY WITH REVISION COMPONENTS Left ~ 2011  . VARICOSE VEIN SURGERY Left ~ 2014   Family Hx:  Family History  Problem Relation Age of Onset  . Esophageal cancer Mother        died at 49  . Heart attack Father        died at 10  . Hypertension Brother    Social Hx:   Social History   Tobacco Use  . Smoking status: Passive Smoke Exposure - Never Smoker  . Smokeless tobacco: Never Used  Substance Use Topics  . Alcohol use: No  . Drug use: No   Medication:    Current Outpatient Medications:  .  albuterol (PROVENTIL HFA;VENTOLIN HFA) 108 (90 Base) MCG/ACT inhaler, Inhale 2 puffs into the lungs every 6 (six) hours as needed for wheezing or shortness of breath., Disp: 1 Inhaler, Rfl: 2 .  amiodarone (PACERONE) 200 MG tablet, Take 1 tablet (200 mg total) by mouth daily., Disp: 90 tablet, Rfl: 3 .  apixaban (ELIQUIS) 5 MG TABS tablet, Take 1 tablet (5 mg total) by mouth 2 (two) times daily., Disp: 180 tablet, Rfl: 3 .  atorvastatin (LIPITOR) 10 MG tablet, Take 10 mg by mouth daily., Disp: , Rfl:  .  budesonide-formoterol (SYMBICORT) 160-4.5 MCG/ACT inhaler, Inhale 2 puffs into the lungs daily., Disp: , Rfl:  .  carvedilol (COREG) 25 MG tablet, Take 1 tablet (25 mg total) by mouth 2 (two) times daily with a meal., Disp: 180 tablet, Rfl: 3 .  Cholecalciferol 1000 units tablet, Take 1,000 Units by mouth daily., Disp: , Rfl:  .  furosemide (LASIX) 40 MG tablet, Take 2 tablets (80 mg) once every other day alternating with 1 tablet (40 mg) once every other day, Disp: 60 tablet, Rfl: 3 .  levothyroxine (SYNTHROID, LEVOTHROID) 75 MCG tablet, Take 75 mcg by mouth daily., Disp: , Rfl: 2 .  losartan (COZAAR) 100 MG tablet, Take 1 tablet (100 mg total)  by mouth daily., Disp: 90 tablet, Rfl: 3 .  Multiple Vitamin (MULTIVITAMIN) capsule, Take 1 capsule by mouth daily., Disp: , Rfl:  .  Omega-3 Fatty Acids (FISH OIL) 1000 MG CAPS, Take 1,000 mg by mouth daily. , Disp: , Rfl:  .  predniSONE (STERAPRED UNI-PAK 21 TAB) 10 MG (21) TBPK tablet, 6 tabs (60 mg) 5/16,  5 tabs (50 mg) 5/17,  4 tabs (40 mg) 5/18,  3 tabs (30 mg) 5/19,  2 tabs ( 20 mg) 5/20, 1 tab (10 mg) 5/21,  Disp: 21 tablet, Rfl: 0   Allergies:  Patient has no known allergies.  Review of Systems: Gen:  Denies  fever, sweats, chills HEENT: Denies blurred vision, double vision. bleeds, sore throat Cvc:  No dizziness, chest pain. Resp:   Denies cough or sputum production, shortness of breath Gi: Denies swallowing difficulty, stomach pain. Gu:  Denies bladder incontinence, burning urine Ext:   No Joint pain, stiffness. Skin: No skin rash,  hives  Endoc:  No polyuria, polydipsia. Psych: No depression, insomnia. Other:  All other systems were reviewed with the patient and were negative other that what is mentioned in the HPI.   Physical Examination:   VS: BP 130/72 (BP Location: Left Arm, Cuff Size: Large)   Pulse 83   Ht 5\' 6"  (1.676 m)   Wt 228 lb (103.4 kg)   SpO2 93%   BMI 36.80 kg/m   General Appearance: No distress  Neuro:without focal findings,  speech normal,  HEENT: PERRLA, EOM intact.  Mallampati 3 Pulmonary: normal breath sounds, No wheezing.  CardiovascularNormal S1,S2.  No m/r/g.   Abdomen: Benign, Soft, non-tender. Renal:  No costovertebral tenderness  GU:  No performed at this time. Endoc: No evident thyromegaly, no signs of acromegaly. Skin:   warm, no rashes, no ecchymosis  Extremities: normal, no cyanosis, clubbing.  Other findings:    LABORATORY PANEL:   CBC Recent Labs  Lab 07/30/17 0924  WBC 8.0  HGB 14.2  HCT 41.3  PLT 159    ------------------------------------------------------------------------------------------------------------------  Chemistries  Recent Labs  Lab 07/30/17 0924  NA 137  K 4.0  CL 103  CO2 24  GLUCOSE 113*  BUN 14  CREATININE 0.83  CALCIUM 8.8*   ------------------------------------------------------------------------------------------------------------------  Cardiac Enzymes Recent Labs  Lab 07/30/17 1230  TROPONINI 0.04*   ------------------------------------------------------------  RADIOLOGY:  No results found.     Thank  you for the consultation and for allowing [facility] to assist in the care of your patient. Our recommendations are noted above.  Please contact us if we can be of further service.   Marda Stalker, MD.  Board Certified in Internal Medicine, Pulmonary Medicine, [place], and Sleep Medicine.  [facility] Office Number: [phone]  Patricia Pesa, M.D.  Merton Border, M.D  08/05/2017

## 2017-08-05 ENCOUNTER — Encounter: Payer: Self-pay | Admitting: Internal Medicine

## 2017-08-05 ENCOUNTER — Ambulatory Visit: Payer: Medicare HMO | Admitting: Internal Medicine

## 2017-08-05 VITALS — BP 130/72 | HR 83 | Ht 66.0 in | Wt 228.0 lb

## 2017-08-05 DIAGNOSIS — J4550 Severe persistent asthma, uncomplicated: Secondary | ICD-10-CM | POA: Diagnosis not present

## 2017-08-05 DIAGNOSIS — G4719 Other hypersomnia: Secondary | ICD-10-CM

## 2017-08-05 LAB — CUP PACEART REMOTE DEVICE CHECK
Battery Voltage: 2.93 V
Brady Statistic AP VP Percent: 3.8 %
Brady Statistic AP VS Percent: 96 %
Brady Statistic AS VP Percent: 1 %
Brady Statistic AS VS Percent: 1 %
Brady Statistic RA Percent Paced: 99 %
HighPow Impedance: 64 Ohm
HighPow Impedance: 64 Ohm
Implantable Lead Location: 753859
Implantable Lead Model: 7122
Lead Channel Impedance Value: 380 Ohm
Lead Channel Pacing Threshold Amplitude: 0.625 V
Lead Channel Pacing Threshold Amplitude: 1.25 V
Lead Channel Sensing Intrinsic Amplitude: 8 mV
Lead Channel Setting Pacing Amplitude: 1.625
Lead Channel Setting Pacing Amplitude: 2.5 V
Lead Channel Setting Pacing Pulse Width: 0.5 ms
MDC IDC LEAD IMPLANT DT: 20160114
MDC IDC LEAD IMPLANT DT: 20160114
MDC IDC LEAD LOCATION: 753860
MDC IDC MSMT BATTERY REMAINING LONGEVITY: 56 mo
MDC IDC MSMT BATTERY REMAINING PERCENTAGE: 64 %
MDC IDC MSMT LEADCHNL RA PACING THRESHOLD PULSEWIDTH: 0.5 ms
MDC IDC MSMT LEADCHNL RA SENSING INTR AMPL: 1.2 mV
MDC IDC MSMT LEADCHNL RV IMPEDANCE VALUE: 350 Ohm
MDC IDC MSMT LEADCHNL RV PACING THRESHOLD PULSEWIDTH: 0.5 ms
MDC IDC PG IMPLANT DT: 20160114
MDC IDC SESS DTM: 20190430080016
MDC IDC SET LEADCHNL RV SENSING SENSITIVITY: 0.5 mV
MDC IDC STAT BRADY RV PERCENT PACED: 3.8 %
Pulse Gen Serial Number: 7241695

## 2017-08-05 MED ORDER — MONTELUKAST SODIUM 10 MG PO TABS: 10.0000 mg | ORAL_TABLET | Freq: Every day | ORAL | 5 refills | Status: DC

## 2017-08-05 MED ORDER — FLUTICASONE-SALMETEROL 250-50 MCG/DOSE IN AEPB: 1.0000 | INHALATION_SPRAY | Freq: Two times a day (BID) | RESPIRATORY_TRACT | 5 refills | Status: DC

## 2017-08-05 NOTE — Patient Instructions (Addendum)
Start advair twice daily, rinse mouth after use. Stop symbicort.  Use albuterol as needed.  Start singulair.  Sleep study in about 1 month.     Sleep Apnea       Sleep apnea is disorder that affects a person's sleep. A person with sleep apnea has abnormal pauses in their breathing when they sleep. It is hard for them to get a good sleep. This makes a person tired during the day. It also can lead to other physical problems. There are three types of sleep apnea. One type is when breathing stops for a short time because your airway is blocked (obstructive sleep apnea). Another type is when the brain sometimes fails to give the normal signal to breathe to the muscles that control your breathing (central sleep apnea). The third type is a combination of the other two types. HOME CARE   Take all medicine as told by your doctor.  Avoid alcohol, calming medicines (sedatives), and depressant drugs.  Try to lose weight if you are overweight. Talk to your doctor about a healthy weight goal.  Your doctor may have you use a device that helps to open your airway. It can help you get the air that you need. It is called a positive airway pressure (PAP) device.   MAKE SURE YOU:   Understand these instructions.  Will watch your condition.  Will get help right away if you are not doing well or get worse.  It may take approximately 1 month for you to get used to wearing her CPAP every night.  Be sure to work with your machine to get used to it, be patient, it may take time!

## 2017-08-07 NOTE — Addendum Note (Signed)
Addended by: Anselm Pancoast on: 08/07/2017 08:17 AM   Modules accepted: Orders

## 2017-08-14 ENCOUNTER — Ambulatory Visit: Payer: Medicare HMO | Admitting: Internal Medicine

## 2017-08-14 ENCOUNTER — Encounter: Payer: Self-pay | Admitting: Internal Medicine

## 2017-08-14 VITALS — BP 110/60 | HR 60 | Ht 66.0 in | Wt 221.5 lb

## 2017-08-14 DIAGNOSIS — I5042 Chronic combined systolic (congestive) and diastolic (congestive) heart failure: Secondary | ICD-10-CM

## 2017-08-14 DIAGNOSIS — Z95810 Presence of automatic (implantable) cardiac defibrillator: Secondary | ICD-10-CM

## 2017-08-14 DIAGNOSIS — Z79899 Other long term (current) drug therapy: Secondary | ICD-10-CM

## 2017-08-14 DIAGNOSIS — I469 Cardiac arrest, cause unspecified: Secondary | ICD-10-CM | POA: Diagnosis not present

## 2017-08-14 DIAGNOSIS — I428 Other cardiomyopathies: Secondary | ICD-10-CM | POA: Diagnosis not present

## 2017-08-14 MED ORDER — FUROSEMIDE 40 MG PO TABS: ORAL_TABLET | ORAL | Status: DC

## 2017-08-14 NOTE — Progress Notes (Signed)
Patient Care Team: Jodi Marble, MD as PCP - General (Internal Medicine) Deboraha Sprang, MD as PCP - Cardiology (Cardiology)   HPI  Ethan Vega. is a 72 y.o. male Seen following ICD implantation 1/16 following aborted cardiac arrest. Cardiac evaluation at that time included an echocardiogram demonstrating normal left ventricular function had recovered from the EF of 30-35% range. Catheterization 1/16 demonstrated no significant coronary disease;   We undertook thallium for perfusion defects looking for infiltrative processes  this was abnormal raising the spectre of sarcoid,    CT-high resolution was neg for interstitial/LN   There were small nodules  Eye exam showed apparently no sarcoid  Flecainide challenge was negative ECG  Low Volts R-V1 with RAD with mild RA enlargement SAECG notdone as he was in AFib RVR and rate related RBBB  He has had no atrial fibrillation.  He remains on amiodarone and apixaban and is tolerating the amiodarone without complaints of sensitivity or nausea. NO bleeding on apixaban.  He was hospitalized 12/18 for syncope and was found to be in ventricular tachycardia below his detection rate.  Amiodarone was increased and the device was reprogrammed.  5/19 hospitalized for acute shortness of breath.  Thought to be a combination of COPD and reactive airways disease as well as heart failure.  Treated with inhalers and steroids; he was also seen by pulmonary.  Their notes were reviewed.  He is much improved.  Edema is resolved.  He is having recumbent cough.  No chest pain.   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 No ischemia  2/18 ECHO EF 40-45 %            Past Medical History:  Diagnosis Date  . AICD (automatic cardioverter/defibrillator) present 03/31/14   a. 03/2014 s/p SJM 2411-36C dual chamber AICD (serial Number [id number])- followed by Dr. Caryl Comes  . Arthritis    a. L knee.  . Asthma   . Cancer ([place])    Melanoma resected from scalp  . Cardiac arrest [facility])    a. 03/30/2014 VF arrest in setting of NICM >> CPR/defib in community >> s/p dual chamber AICD  . Coronary artery disease, non-occlusive    a. cath 03/2014 at [facility]: no sig CAD (OM1 30%, CFX 30%), EF 35%  . DM2 (diabetes mellitus, type 2) ([place])    a. Diet controlled.  Marland Kitchen HLD (hyperlipidemia)   . HTN (hypertension)   . Kidney stones   . NICM (nonischemic cardiomyopathy) ([place])    a. 03/2014 Echo:  Inferolateral and lateral HK, EF 30-07%, grade 1 diastolic dysfunction, mild MR, mild LAE, mild RAE, trivial TR, no effusion.  . Paroxysmal atrial fibrillation ([place]) 03/28/2015  . Retinal artery occlusion  Past Surgical History:  Procedure Laterality Date  . adenomatous polyps    . CARDIAC CATHETERIZATION  03/30/2014  . CARDIOVERSION  03/30/2014  . COLONOSCOPY WITH PROPOFOL N/A 03/24/2017   Procedure: COLONOSCOPY WITH PROPOFOL;  Surgeon: Lollie Sails, MD;  Location: [facility] ENDOSCOPY;  Service: Endoscopy;  Laterality: N/A;  . CYSTOSCOPY W/ LITHOLAPAXY / EHL    . IMPLANTABLE CARDIOVERTER DEFIBRILLATOR IMPLANT N/A 03/31/2014   Procedure: IMPLANTABLE CARDIOVERTER DEFIBRILLATOR IMPLANT;  Surgeon: Evans Lance, MD;  Location: [facility] CATH LAB;  Service: Cardiovascular;  Laterality: N/A;  . JOINT REPLACEMENT    . TONSILLECTOMY     "as a kid"  . TOTAL KNEE ARTHROPLASTY Left ~ 2010  . TOTAL KNEE ARTHROPLASTY WITH REVISION COMPONENTS Left ~ 2011  . VARICOSE VEIN SURGERY Left ~ 2014    Current Outpatient Medications  Medication Sig Dispense Refill  . albuterol (PROVENTIL HFA;VENTOLIN HFA) 108 (90 Base) MCG/ACT  inhaler Inhale 2 puffs into the lungs every 6 (six) hours as needed for wheezing or shortness of breath. 1 Inhaler 2  . amiodarone (PACERONE) 200 MG tablet Take 1 tablet (200 mg total) by mouth daily. 90 tablet 3  . apixaban (ELIQUIS) 5 MG TABS tablet Take 1 tablet (5 mg total) by mouth 2 (two) times daily. 180 tablet 3  . atorvastatin (LIPITOR) 10 MG tablet Take 10 mg by mouth daily.    . carvedilol (COREG) 25 MG tablet Take 1 tablet (25 mg total) by mouth 2 (two) times daily with a meal. 180 tablet 3  . Fluticasone-Salmeterol (ADVAIR DISKUS) 250-50 MCG/DOSE AEPB Inhale 1 puff into the lungs 2 (two) times daily. Rinse mouth after use. 1 each 5  . furosemide (LASIX) 40 MG tablet Take 2 tablets (80 mg) once every other day alternating with 1 tablet (40 mg) once every other day 60 tablet 3  . levothyroxine (SYNTHROID, LEVOTHROID) 75 MCG tablet Take 75 mcg by mouth daily.  2  . losartan (COZAAR) 100 MG tablet Take 1 tablet (100 mg total) by mouth daily. 90 tablet 3  . montelukast (SINGULAIR) 10 MG tablet Take 1 tablet (10 mg total) by mouth at bedtime. 30 tablet 5   No current facility-administered medications for this visit.     No Known Allergies  Review of Systems negative except from HPI and PMH  Physical Exam BP 110/60 (BP Location: Left Arm, Patient Position: Sitting, Cuff Size: Normal)   Pulse 60   Ht 5\' 6"  (1.676 m)   Wt 221 lb 8 oz (100.5 kg)   BMI 35.75 kg/m  Well developed and nourished in no acute distress HENT normal Neck supple with JVP-flat Clear Regular rate and rhythm, no murmurs or gallops Abd-soft with active BS No Clubbing cyanosis edema Skin-warm and dry A & Oriented  Grossly normal sensory and motor function  ECG a paced at 60 24/10/41 Low voltage  Assessment and  Plan  Aborted Cardiac arrest//FHx Cardiac Arrest   ICD  St Jude   Hypertension  Sinus bradycardia/chronotropic incompetence  Low Voltage ECG  Atrial Fibrillation   Retinal artery  occlusion  Renal insufficiency, volume sensitive  Congestive heart failure- chronic-systolic/diastolic  Reactive Airways disease   Tolerating amio  Euvolemic continue current meds  Will need to check renal function  Will decrease furosemide 80/40>> 40 daily  On Anticoagulation;  No bleeding issues \  BP well controlled  Have asked that he followup with PCP regarding asthma, and have encouraged him to do his sleep study  His recumbent  cough, in the setting of no edema and flat neck veins, likely asthma-- try nocturnal inhaler   We spent more than 50% of our >25 min visit in face to face counseling regarding the above

## 2017-08-14 NOTE — Patient Instructions (Addendum)
Medication Instructions: - Your physician has recommended you make the following change in your medication:  1) DECREASE lasix (furosemide) to 40 mg- take 1 tablet (40 mg) by mouth once daily  Labwork: - Your physician recommends that you have lab work today: TSH/ CMET  Procedures/Testing: - none ordered  Follow-Up: - Your physician wants you to follow-up in: 6 months with Dr. Caryl Comes. You will receive a reminder letter in the mail two months in advance. If you don't receive a letter, please call our office to schedule the follow-up appointment.  - follow up with you primary care regarding your asthma- 09/03/17 @ 9:45 am   Any Additional Special Instructions Will Be Listed Below (If Applicable).     If you need a refill on your cardiac medications before your next appointment, please call your pharmacy.

## 2017-08-15 ENCOUNTER — Telehealth: Payer: Self-pay | Admitting: Internal Medicine

## 2017-08-15 LAB — COMPREHENSIVE METABOLIC PANEL
A/G RATIO: 1.7 (ref 1.2–2.2)
ALT: 35 IU/L (ref 0–44)
AST: 32 IU/L (ref 0–40)
Albumin: 4 g/dL (ref 3.5–4.8)
Alkaline Phosphatase: 78 IU/L (ref 39–117)
BILIRUBIN TOTAL: 2.7 mg/dL — AB (ref 0.0–1.2)
BUN/Creatinine Ratio: 24 (ref 10–24)
BUN: 28 mg/dL — ABNORMAL HIGH (ref 8–27)
CHLORIDE: 105 mmol/L (ref 96–106)
CO2: 25 mmol/L (ref 20–29)
Calcium: 9.1 mg/dL (ref 8.6–10.2)
Creatinine, Ser: 1.17 mg/dL (ref 0.76–1.27)
GFR calc non Af Amer: 61 mL/min/{1.73_m2} (ref >59)
GFR, EST AFRICAN AMERICAN: 71 mL/min/{1.73_m2} (ref >59)
Globulin, Total: 2.4 g/dL (ref 1.5–4.5)
Glucose: 106 mg/dL — ABNORMAL HIGH (ref 65–99)
POTASSIUM: 4.2 mmol/L (ref 3.5–5.2)
Sodium: 144 mmol/L (ref 134–144)
TOTAL PROTEIN: 6.4 g/dL (ref 6.0–8.5)

## 2017-08-15 LAB — TSH: TSH: 1.21 u[IU]/mL (ref 0.450–4.500)

## 2017-08-15 NOTE — Telephone Encounter (Signed)
Patient scheduled to see  ENT next week to check an issue with top gums dentist found on a rov .  Patient wants to know is dr. Caryl Comes feels  Like he is clear to do this appt. That may result in a possible procedure. Patient aware that office generally sends a clearance form to [place] with specifics.

## 2017-08-15 NOTE — Telephone Encounter (Signed)
I called and spoke with the patient. He is aware that he is ok to have a consultation with an ENT for issues with his gums. I have advised him if the ENT feels that he needs to have some dental work done and they need input from Dr. Caryl Comes, then they can notify us after his office visit. The patient voices understanding. He is also aware of the preliminary findings on his lab work from 5/30.

## 2017-08-18 ENCOUNTER — Other Ambulatory Visit: Payer: Self-pay | Admitting: Otolaryngology

## 2017-08-18 DIAGNOSIS — M2670 Unspecified alveolar anomaly: Secondary | ICD-10-CM

## 2017-08-22 ENCOUNTER — Ambulatory Visit
Admission: RE | Admit: 2017-08-22 | Discharge: 2017-08-22 | Disposition: A | Discharge status: Home / Self Care | Payer: Medicare HMO | Source: Ambulatory Visit | Attending: Otolaryngology | Admitting: Otolaryngology

## 2017-08-22 DIAGNOSIS — M2670 Unspecified alveolar anomaly: Secondary | ICD-10-CM | POA: Diagnosis present

## 2017-08-22 DIAGNOSIS — M2740 Unspecified cyst of jaw: Secondary | ICD-10-CM | POA: Diagnosis not present

## 2017-08-22 DIAGNOSIS — J320 Chronic maxillary sinusitis: Secondary | ICD-10-CM | POA: Diagnosis not present

## 2017-08-22 LAB — POCT I-STAT CREATININE: Creatinine, Ser: 1.3 mg/dL — ABNORMAL HIGH (ref 0.61–1.24)

## 2017-08-22 MED ORDER — IOPAMIDOL (ISOVUE-300) INJECTION 61%
75.0000 mL | Freq: Once | INTRAVENOUS | Status: AC | PRN
  Administered 2017-08-22: 75 mL via INTRAVENOUS

## 2017-09-15 ENCOUNTER — Telehealth: Payer: Self-pay | Admitting: Internal Medicine

## 2017-09-15 NOTE — Telephone Encounter (Signed)
Patient calling the office for samples of medication:   1.  What medication and dosage are you requesting samples for? Eliquis 5 MG  2.  Are you currently out of this medication? Not yet, will be out by end of the month and will not be able to afford refill

## 2017-09-15 NOTE — Telephone Encounter (Signed)
Caryl Pina with [facility] calling stating patient was seen in their office on Friday   But had some questions about his medication Amiodarone They asked [place] to please call patient back  He's not sure if he is to be taking this or not He's confused for It does damage on kidneys and liver Was told by one provider to take it and one to not  He would like a call back about this

## 2017-09-15 NOTE — Telephone Encounter (Signed)
I called and spoke with the patient and clarified his amiodarone dose with him. He has been taking amiodarone 200 mg once daily which is correct.  His questions were answered. He states that his PCP sent him for a liver ultrasound due to elevated bilirubin level on most recent lab work and he is to follow up with him on 09/17/17.

## 2017-09-16 MED ORDER — APIXABAN 5 MG PO TABS: 5.0000 mg | ORAL_TABLET | Freq: Two times a day (BID) | ORAL | 3 refills | Status: DC

## 2017-09-16 NOTE — Telephone Encounter (Signed)
Spoke with patient and he is now in the donut hole and just is not able to afford his Eliquis. Patient requested samples and we discussed patient assistance application. He was very appreciative for the call back and wanted to know if we could assist him with the application process. Advised that I would get form completed and would leave samples up front for him to pick up. He verbalized understanding with no further questions at this time.   Medication Samples have been provided to the patient.  Drug name: Eliquis       Strength: 5 mg        Qty: 4 boxes  LOT: [id number]  Exp.Date: Jun 2021

## 2017-09-25 ENCOUNTER — Encounter: Payer: Self-pay | Admitting: Internal Medicine

## 2017-09-25 NOTE — Progress Notes (Signed)
Patient Assistance form for Eliquis faxed to [place] at [phone]. Confirmation received.

## 2017-09-26 ENCOUNTER — Telehealth: Payer: Self-pay | Admitting: Internal Medicine

## 2017-09-26 NOTE — Telephone Encounter (Signed)
The patient is aware of Dr. Olin Pia recommendations to hold eliquis the night before and morning of his dental procedure. He is also aware to resume whenever the dentist feels is safe for him to do so.

## 2017-09-26 NOTE — Telephone Encounter (Signed)
Patient came by office  States he will be having oral surgery on Monday 7/15 States he was told to not take his Eliquis medication the morning of but it would be fine to start back that evening Would like to see if that is ok Please call to discuss

## 2017-09-26 NOTE — Telephone Encounter (Signed)
Typically would hold for 24-36 hours.  With his prior stroke would air on the side of shorter.  Hence, we will have him hold the dose the night before and the  morning of the procedure.  Resumption will be per his dentist

## 2017-09-26 NOTE — Telephone Encounter (Signed)
To Dr. Klein to review. 

## 2017-09-29 ENCOUNTER — Encounter: Payer: Self-pay | Admitting: Emergency Medicine

## 2017-09-29 ENCOUNTER — Emergency Department
Admission: EM | Admit: 2017-09-29 | Discharge: 2017-09-29 | Disposition: A | Discharge status: Home / Self Care | Payer: Medicare HMO | Attending: Emergency Medicine | Admitting: Emergency Medicine

## 2017-09-29 ENCOUNTER — Other Ambulatory Visit: Payer: Self-pay

## 2017-09-29 DIAGNOSIS — T424X5A Adverse effect of benzodiazepines, initial encounter: Secondary | ICD-10-CM | POA: Insufficient documentation

## 2017-09-29 DIAGNOSIS — J45909 Unspecified asthma, uncomplicated: Secondary | ICD-10-CM | POA: Insufficient documentation

## 2017-09-29 DIAGNOSIS — Y69 Unspecified misadventure during surgical and medical care: Secondary | ICD-10-CM | POA: Insufficient documentation

## 2017-09-29 DIAGNOSIS — Z7722 Contact with and (suspected) exposure to environmental tobacco smoke (acute) (chronic): Secondary | ICD-10-CM | POA: Insufficient documentation

## 2017-09-29 DIAGNOSIS — E119 Type 2 diabetes mellitus without complications: Secondary | ICD-10-CM | POA: Diagnosis not present

## 2017-09-29 DIAGNOSIS — T41295A Adverse effect of other general anesthetics, initial encounter: Secondary | ICD-10-CM | POA: Diagnosis not present

## 2017-09-29 DIAGNOSIS — T887XXA Unspecified adverse effect of drug or medicament, initial encounter: Secondary | ICD-10-CM | POA: Insufficient documentation

## 2017-09-29 DIAGNOSIS — Z7901 Long term (current) use of anticoagulants: Secondary | ICD-10-CM | POA: Diagnosis not present

## 2017-09-29 DIAGNOSIS — I9789 Other postprocedural complications and disorders of the circulatory system, not elsewhere classified: Secondary | ICD-10-CM

## 2017-09-29 DIAGNOSIS — I10 Essential (primary) hypertension: Secondary | ICD-10-CM | POA: Insufficient documentation

## 2017-09-29 DIAGNOSIS — Z79899 Other long term (current) drug therapy: Secondary | ICD-10-CM | POA: Insufficient documentation

## 2017-09-29 DIAGNOSIS — Z95810 Presence of automatic (implantable) cardiac defibrillator: Secondary | ICD-10-CM | POA: Diagnosis not present

## 2017-09-29 LAB — BASIC METABOLIC PANEL
ANION GAP: 7 (ref 5–15)
BUN: 23 mg/dL (ref 8–23)
CALCIUM: 9.2 mg/dL (ref 8.9–10.3)
CO2: 29 mmol/L (ref 22–32)
Chloride: 107 mmol/L (ref 98–111)
Creatinine, Ser: 1 mg/dL (ref 0.61–1.24)
GFR calc non Af Amer: >60 mL/min (ref >60)
Glucose, Bld: 113 mg/dL — ABNORMAL HIGH (ref 70–99)
POTASSIUM: 4 mmol/L (ref 3.5–5.1)
Sodium: 143 mmol/L (ref 135–145)

## 2017-09-29 LAB — CBC
HEMATOCRIT: 39.3 % — AB (ref 40.0–52.0)
Hemoglobin: 13.8 g/dL (ref 13.0–18.0)
MCH: 33.1 pg (ref 26.0–34.0)
MCHC: 35 g/dL (ref 32.0–36.0)
MCV: 94.4 fL (ref 80.0–100.0)
PLATELETS: 201 10*3/uL (ref 150–440)
RBC: 4.17 MIL/uL — AB (ref 4.40–5.90)
RDW: 14.7 % — AB (ref 11.5–14.5)
WBC: 5.2 10*3/uL (ref 3.8–10.6)

## 2017-09-29 LAB — TROPONIN I: Troponin I: 0.03 ng/mL (ref <0.03)

## 2017-09-29 NOTE — ED Notes (Signed)
Date and time results received: 09/29/17 1229  Test: troponin I Critical Value: 0.03  Name of Provider Notified: dr. Kerman Passey  Orders Received? Or Actions Taken?: no new orders at this time

## 2017-09-29 NOTE — ED Provider Notes (Signed)
[facility] Emergency Department Provider Note  Time seen: 11:33 AM  I have reviewed the triage vital signs and the nursing notes.   HISTORY  Chief Complaint Medication Reaction    HPI Ethan Vega. is a 72 y.o. male with a past medical history of CAD with cardiac arrest in 2016 status post pacemaker/AICD, diabetes, hypertension, hyperlipidemia, presents to the emergency department from an oral surgery office for evaluation.  According to patient/family his heart "went crazy" while starting sedation.  Patient states he was awake and alert the entire time, did not become sedated.  Remains symptom-free the entire time.  Denies any chest pain trouble breathing dizziness, nausea or diaphoresis.  Patient did not feel any defibrillation.  They are not sure why they had to come to the emergency department but the oral surgeon strongly recommended they come for evaluation and pacemaker interrogation.  Upon arrival patient denies any complaints, is asking if he can eat and how long it will be until he can go home.  Past Medical History:  Diagnosis Date  . AICD (automatic cardioverter/defibrillator) present 03/31/14   a. 03/2014 s/p SJM 2411-36C dual chamber AICD (serial Number [id number])- followed by Dr. Caryl Comes  . Arthritis    a. L knee.  . Asthma   . Cancer ([place])    Melanoma resected from scalp  . Cardiac arrest [facility])    a. 03/30/2014 VF arrest in setting of NICM >> CPR/defib in community >> s/p dual chamber AICD  . Coronary artery disease, non-occlusive    a. cath 03/2014 at [facility]: no sig CAD (OM1 30%, CFX 30%), EF 35%  . DM2 (diabetes mellitus, type 2) ([place])    a. Diet controlled.  Marland Kitchen HLD (hyperlipidemia)   . HTN (hypertension)   . Kidney stones   . NICM (nonischemic cardiomyopathy) ([place])    a. 03/2014 Echo:  Inferolateral and lateral HK, EF 37-48%, grade 1 diastolic dysfunction, mild MR, mild LAE, mild RAE, trivial TR, no effusion.  . Paroxysmal atrial fibrillation  ([place]) 03/28/2015  . Retinal artery occlusion     Patient Active Problem List   Diagnosis Date Noted  . Ventricular tachycardia ([place]) 02/16/2017  . Paroxysmal atrial fibrillation ([place]) 03/28/2015  . Leg swelling 07/27/2014  . NICM (nonischemic cardiomyopathy) ([place]) 04/03/2014  . HTN (hypertension) 04/03/2014  . S/P implantation of automatic cardioverter/defibrillator (AICD) 04/03/2014  . Prolonged Q-T interval on ECG 04/03/2014  . Encounter for monitoring amiodarone therapy 04/03/2014  . Sinus node dysfunction/bradycardia 04/01/2014  . Elevated transaminase level 04/01/2014  . Cardiac arrest ([place]) 03/30/2014    Past Surgical History:  Procedure Laterality Date  . adenomatous polyps    . CARDIAC CATHETERIZATION  03/30/2014  . CARDIOVERSION  03/30/2014  . COLONOSCOPY WITH PROPOFOL N/A 03/24/2017   Procedure: COLONOSCOPY WITH PROPOFOL;  Surgeon: Lollie Sails, MD;  Location: [facility] ENDOSCOPY;  Service: Endoscopy;  Laterality: N/A;  . CYSTOSCOPY W/ LITHOLAPAXY / EHL    . IMPLANTABLE CARDIOVERTER DEFIBRILLATOR IMPLANT N/A 03/31/2014   Procedure: IMPLANTABLE CARDIOVERTER DEFIBRILLATOR IMPLANT;  Surgeon: Evans Lance, MD;  Location: [facility] CATH LAB;  Service: Cardiovascular;  Laterality: N/A;  . JOINT REPLACEMENT    . TONSILLECTOMY     "as a kid"  . TOTAL KNEE ARTHROPLASTY Left ~ 2010  . TOTAL KNEE ARTHROPLASTY WITH REVISION COMPONENTS Left ~ 2011  . VARICOSE VEIN SURGERY Left ~ 2014    Prior to Admission medications   Medication Sig Start Date End Date Taking? Authorizing Provider  albuterol (PROVENTIL HFA;VENTOLIN HFA) 108 (90 Base) MCG/ACT inhaler Inhale 2 puffs into the lungs every 6 (six) hours as needed for wheezing or shortness of breath. 07/30/17   Rudene Re, MD  amiodarone (PACERONE) 200 MG tablet Take 1 tablet (200 mg total) by mouth daily. 06/03/17   Deboraha Sprang, MD  apixaban (ELIQUIS) 5 MG TABS tablet Take 1 tablet (5 mg total) by mouth 2 (two) times daily. 09/16/17    Deboraha Sprang, MD  atorvastatin (LIPITOR) 10 MG tablet Take 10 mg by mouth daily.    [provider]  carvedilol (COREG) 25 MG tablet Take 1 tablet (25 mg total) by mouth 2 (two) times daily with a meal. 03/13/17   Deboraha Sprang, MD  Fluticasone-Salmeterol (ADVAIR DISKUS) 250-50 MCG/DOSE AEPB Inhale 1 puff into the lungs 2 (two) times daily. Rinse mouth after use. 08/05/17 08/05/18  Laverle Hobby, MD  furosemide (LASIX) 40 MG tablet Take 1 tablet (40 mg) by mouth once daily 08/14/17   Deboraha Sprang, MD  levothyroxine (SYNTHROID, LEVOTHROID) 75 MCG tablet Take 75 mcg by mouth daily. 02/19/17   [provider]  losartan (COZAAR) 100 MG tablet Take 1 tablet (100 mg total) by mouth daily. 04/25/17   Deboraha Sprang, MD  montelukast (SINGULAIR) 10 MG tablet Take 1 tablet (10 mg total) by mouth at bedtime. 08/05/17   Laverle Hobby, MD    No Known Allergies  Family History  Problem Relation Age of Onset  . Esophageal cancer Mother        died at 67  . Heart attack Father        died at 10  . Hypertension Brother     Social History Social History   Tobacco Use  . Smoking status: Passive Smoke Exposure - Never Smoker  . Smokeless tobacco: Never Used  Substance Use Topics  . Alcohol use: No  . Drug use: No    Review of Systems Constitutional: Negative for fever. Eyes: Negative for visual complaints ENT: Negative for recent illness/congestion Cardiovascular: Negative for chest pain. Respiratory: Negative for shortness of breath. Gastrointestinal: Negative for abdominal pain, vomiting Musculoskeletal: Negative for musculoskeletal complaints Skin: Negative for skin complaints  Neurological: Negative for headache All other ROS negative  ____________________________________________   PHYSICAL EXAM:  VITAL SIGNS: ED Triage Vitals  Enc Vitals Group     BP --      Pulse --      Resp --      Temp --      Temp src --      SpO2 --      Weight  09/29/17 1109 220 lb (99.8 kg)     Height 09/29/17 1109 5\' 6"  (1.676 m)     Head Circumference --      Peak Flow --      Pain Score 09/29/17 1108 0     Pain Loc --      Pain Edu? --      Excl. in [place]? --    Constitutional: Alert and oriented. Well appearing and in no distress. Eyes: Normal exam ENT   Head: Normocephalic and atraumatic.   Mouth/Throat: Mucous membranes are moist. Cardiovascular: Normal rate, regular rhythm. No murmur Respiratory: Normal respiratory effort without tachypnea nor retractions. Breath sounds are clear Gastrointestinal: Soft and nontender. No distention.  Musculoskeletal: Nontender with normal range of motion in all extremities. No lower extremity tenderness Neurologic:  Normal speech and language. No gross focal neurologic deficits Skin:  Skin is warm, dry and intact.  Psychiatric: Mood and affect are normal.   ____________________________________________    EKG  EKG reviewed and interpreted by myself shows an atrial paced rhythm at 60 bpm, narrow QRS, normal axis, no concerning ST changes noted.  ____________________________________________    INITIAL IMPRESSION / ASSESSMENT AND PLAN / ED COURSE  Pertinent labs & imaging results that were available during my care of the patient were reviewed by me and considered in my medical decision making (see chart for details).  Patient presents to the emergency department for evaluation from a oral surgery office.  Differential would include arrhythmia, artifact.  Patient has no symptoms, no complaints.  States he has not eaten since last night he is very hungry.  Will let the patient eat in the emergency department will attempt to have the pacemaker interrogated in the emergency department.  We will place the patient on cardiac monitoring in the emergency department.  Patient's labs are resulted largely at baseline.  Troponin is 0.03 however this is unchanged from historical data.  Awaiting  interrogation results.  Patient continues to be symptom-free in the emergency department.  I was able to discuss with the [facility].  He states no arrhythmias, no abnormalities.  Patient's medical work-up is nonrevealing.  Patient continues to feel well and is asking to be discharged home.  Patient will follow-up with his doctor.  ____________________________________________   FINAL CLINICAL IMPRESSION(S) / ED DIAGNOSES  Medical Evaluation    Harvest Dark, MD 09/29/17 [phone]

## 2017-09-29 NOTE — ED Notes (Signed)

## 2017-09-29 NOTE — ED Triage Notes (Signed)
Was at oral surgeon.  Was given versed and propofol and had some sort of reaction.  They are unsure but say his "heart went crazy" and he got shaking.  Also ssaid they thought he might need his defibrilator interogated.

## 2017-09-30 ENCOUNTER — Telehealth: Payer: Self-pay | Admitting: Internal Medicine

## 2017-09-30 NOTE — Telephone Encounter (Signed)
Patient wants to talk to Dr. Olin Pia nurse about episode he had after sedation at the dentist .  Patient was sent to ED for erratic HR .    Patient states he did not take blood thinner and amiodarone the day before and he thinks he brought this situation on himself.   Per Patient please check ED visit notes and results .  Patient wants Dr. Olin Pia opinion that everything is ok.    Please call to discuss.

## 2017-09-30 NOTE — Telephone Encounter (Signed)
I called and spoke with the patient.  He states that he went to the dentist yesterday to have a cyst removed from the top of his gum/ lip.  He was given "gas." He states that the next thing he knew he was being woken up with no procedure being done.  He was told that his heart starting "going crazy" and his arms started lifting up off the arm rest/ they were "jittery." Per Mr. Unk Lightning, the dentist advised that he had already called EMS to come evaluate him in the office. Per EMS, the patient's heart was ok on their arrival. He was taken to the ER, but rode in his daughter's car.  Per the ER notes, the patient's device was insterrogated by [employer] with no report of any abnormalities.  The patient states he missed taking his amiodarone yesterday morning. He did take Advair and his PRN albuterol. He state he wasn't SOB, but wanted to be in good condition with his breathing during the dental procedure.  He states he felt good all day yesterday and today. He has been told by the dentist that he will need to get the ok from Dr. Caryl Comes to proceed with his dental procedure. I advised him that I will forward to Dr. Caryl Comes to review and we will be in touch with him later this week.  The patient is agreeable.

## 2017-10-02 NOTE — Telephone Encounter (Signed)
MOD from 09/29/17 @ 1127 reviewed.  Presenting rhythm: ApVs @ 60bpm. No atrial or ventricular arrhythmias. 98%Ap, 2.7%Vp. <1% PVC burden. <1% HR's >110bpm. Stable lead measurements. Stable corvue. Normal device function.

## 2017-10-02 NOTE — Telephone Encounter (Signed)
I will ask device clinic to get remote report to see if we can make sense of wath happened  thnks ladies

## 2017-10-06 NOTE — Telephone Encounter (Signed)
Patient came to the office to check status of clearance.  Please call.

## 2017-10-07 NOTE — Telephone Encounter (Signed)
I saw nothing on the device to explain the event-- no cardiac explanation for event and no cardiac reason to not proceed   It might be reasonable for the procedure to be done in a more intensevly observed facility

## 2017-10-08 ENCOUNTER — Telehealth: Payer: Self-pay | Admitting: Internal Medicine

## 2017-10-08 MED ORDER — BUDESONIDE-FORMOTEROL FUMARATE 160-4.5 MCG/ACT IN AERO: 2.0000 | INHALATION_SPRAY | Freq: Two times a day (BID) | RESPIRATORY_TRACT | 6 refills | Status: DC

## 2017-10-08 NOTE — Telephone Encounter (Signed)
Can try symbicort 200; 2 puffs bid, rinse mouth after use.

## 2017-10-08 NOTE — Telephone Encounter (Signed)
rx sent Pt advised. Nothing needed.

## 2017-10-08 NOTE — Telephone Encounter (Signed)
Patient wants to see if there is an alternative to advair it is too expensive to refill. Please call

## 2017-10-08 NOTE — Telephone Encounter (Signed)
Patient also advised if Symbicort too expensive then call his insurance to find covered alternative that he can afford.

## 2017-10-09 ENCOUNTER — Encounter: Payer: Self-pay | Admitting: Internal Medicine

## 2017-10-09 NOTE — Telephone Encounter (Signed)
Copy of this phone encounter faxed to Dr. Albertine Grates at [facility] at [phone]. Confirmation received.   Office phone #: [phone].

## 2017-10-09 NOTE — Progress Notes (Signed)
Fax received from [employer] patient assistance that the patient does not qualify for eliquis patient assistance at this time due to "product covered by insurance."

## 2017-10-09 NOTE — Telephone Encounter (Signed)
I called and spoke with the patient and advised him of Dr. Olin Pia recommendations. I advised him I will forward a copy of this encounter to Dr. Albertine Grates at [facility].  He is aware that any further discussion regarding his dental procedure will need to be had with Dr. Albertine Grates. The patient voices understanding and is agreeable.

## 2017-10-13 ENCOUNTER — Telehealth: Payer: Self-pay | Admitting: Internal Medicine

## 2017-10-13 NOTE — Telephone Encounter (Signed)
Pt calling stating he's been having an odd feeling, not pains but "woozy' states it's been going on for a few weeks now   He states anytime bends over, tie his shoes or put harness on dog he gets this feeling He states as he stands straight he would be okay.  He kept saying over it is not a pain but an odd feeling. He is concerned it could be something with his device   Please advise

## 2017-10-13 NOTE — Telephone Encounter (Signed)
Noted.  Will await a call back from the patient if he needs further evaluation by cardiology.

## 2017-10-13 NOTE — Telephone Encounter (Signed)
Pt called back and states that his PCP is going to see him for this issue.

## 2017-10-14 ENCOUNTER — Telehealth: Payer: Self-pay

## 2017-10-14 ENCOUNTER — Ambulatory Visit (INDEPENDENT_AMBULATORY_CARE_PROVIDER_SITE_OTHER): Payer: Medicare HMO | Admitting: *Deleted

## 2017-10-14 DIAGNOSIS — I428 Other cardiomyopathies: Secondary | ICD-10-CM | POA: Diagnosis not present

## 2017-10-14 NOTE — Telephone Encounter (Signed)
I spoke with the patient about this.

## 2017-10-14 NOTE — Progress Notes (Signed)
Remote ICD transmission.   

## 2017-10-15 ENCOUNTER — Encounter: Payer: Self-pay | Admitting: Cardiology

## 2017-11-15 LAB — CUP PACEART REMOTE DEVICE CHECK
Battery Voltage: 2.93 V
Brady Statistic AP VP Percent: 2.9 %
Brady Statistic AS VP Percent: 1 %
Brady Statistic AS VS Percent: 1 %
Date Time Interrogation Session: 20190730184618
HighPow Impedance: 71 Ohm
HighPow Impedance: 71 Ohm
Implantable Lead Implant Date: 20160114
Implantable Lead Location: 753859
Implantable Lead Location: 753860
Implantable Lead Model: 7122
Lead Channel Impedance Value: 380 Ohm
Lead Channel Pacing Threshold Amplitude: 0.625 V
Lead Channel Pacing Threshold Pulse Width: 0.5 ms
Lead Channel Pacing Threshold Pulse Width: 0.5 ms
Lead Channel Sensing Intrinsic Amplitude: 1.6 mV
Lead Channel Setting Pacing Amplitude: 2.5 V
MDC IDC LEAD IMPLANT DT: 20160114
MDC IDC MSMT BATTERY REMAINING LONGEVITY: 53 mo
MDC IDC MSMT BATTERY REMAINING PERCENTAGE: 61 %
MDC IDC MSMT LEADCHNL RV IMPEDANCE VALUE: 360 Ohm
MDC IDC MSMT LEADCHNL RV PACING THRESHOLD AMPLITUDE: 1.25 V
MDC IDC MSMT LEADCHNL RV SENSING INTR AMPL: 7.3 mV
MDC IDC PG IMPLANT DT: 20160114
MDC IDC PG SERIAL: 7241695
MDC IDC SET LEADCHNL RA PACING AMPLITUDE: 1.625
MDC IDC SET LEADCHNL RV PACING PULSEWIDTH: 0.5 ms
MDC IDC SET LEADCHNL RV SENSING SENSITIVITY: 0.5 mV
MDC IDC STAT BRADY AP VS PERCENT: 96 %
MDC IDC STAT BRADY RA PERCENT PACED: 98 %
MDC IDC STAT BRADY RV PERCENT PACED: 2.9 %

## 2018-01-01 ENCOUNTER — Telehealth: Payer: Self-pay | Admitting: Cardiology

## 2018-01-01 NOTE — Telephone Encounter (Signed)
Opened in error

## 2018-01-03 ENCOUNTER — Other Ambulatory Visit: Payer: Self-pay | Admitting: Internal Medicine

## 2018-01-13 ENCOUNTER — Ambulatory Visit (INDEPENDENT_AMBULATORY_CARE_PROVIDER_SITE_OTHER): Payer: Medicare HMO | Admitting: *Deleted

## 2018-01-13 ENCOUNTER — Telehealth: Payer: Self-pay | Admitting: Cardiology

## 2018-01-13 DIAGNOSIS — I428 Other cardiomyopathies: Secondary | ICD-10-CM

## 2018-01-13 NOTE — Telephone Encounter (Signed)
Spoke with pt and reminded pt of remote transmission that is due today. Pt verbalized understanding.   

## 2018-01-13 NOTE — Progress Notes (Signed)
Remote ICD transmission.   

## 2018-02-24 IMAGING — US US ABDOMEN COMPLETE
1 series · 13 of 25 positions shown · non-contrast
Comparison: Abdominal ultrasound August 08, 2004

CLINICAL DATA: Test 1 2 elevated transaminase levels

EXAM:
ABDOMEN ULTRASOUND COMPLETE

[Series 1: us abdomen complete · 0.26mm/px · 13 of 136 slices shown]
[im 1/136]
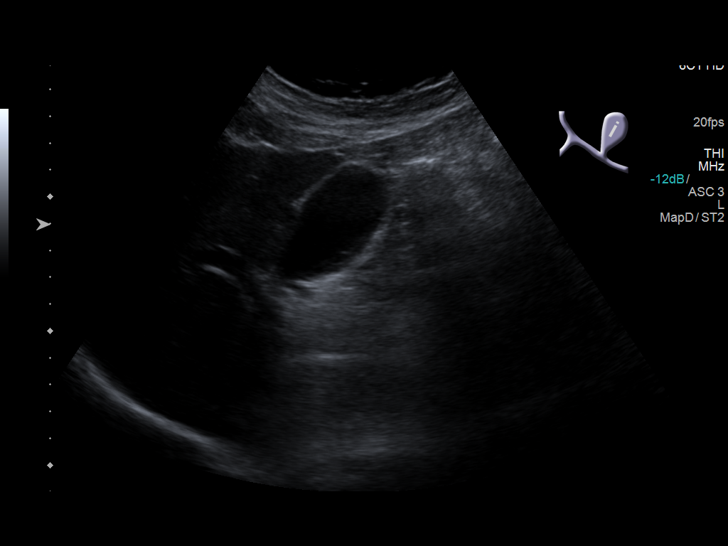
[im 12/136]
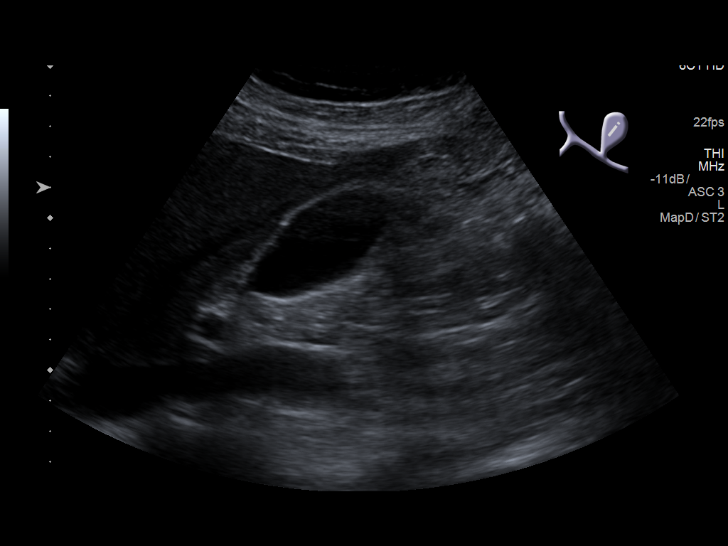
[im 23/136]
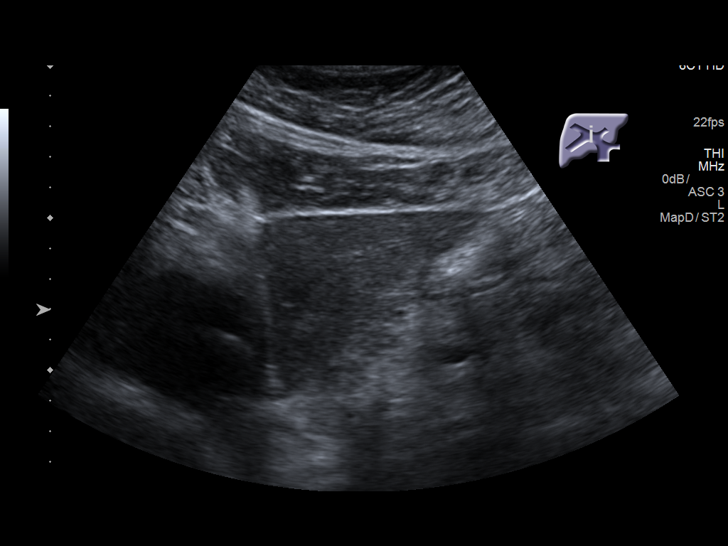
[im 34/136]
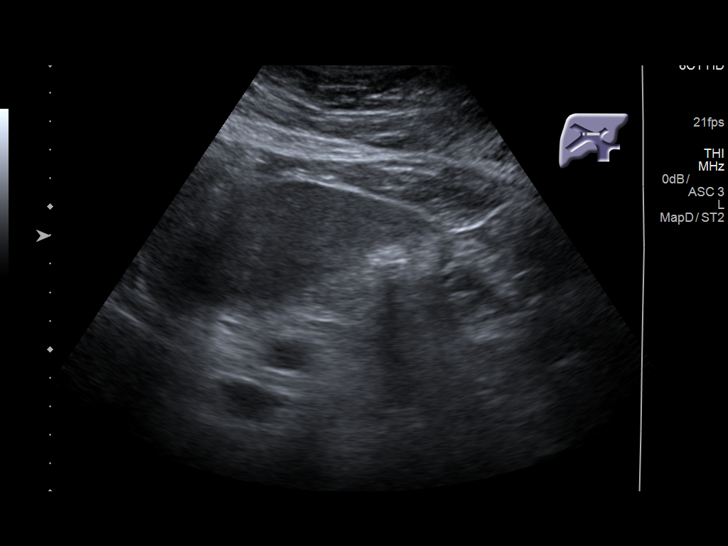
[im 46/136]
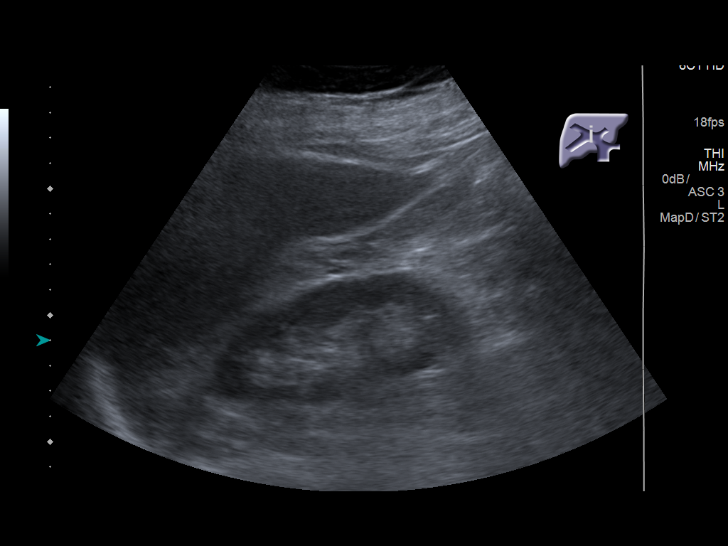
[im 57/136]
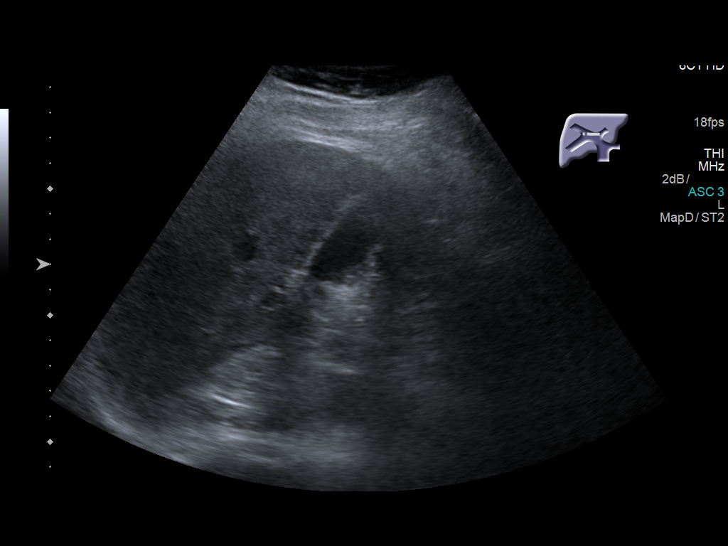
[im 68/136]
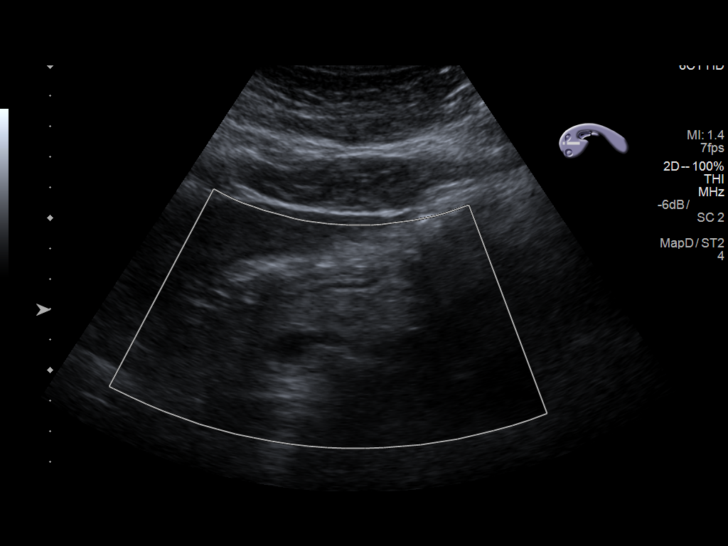
[im 79/136]
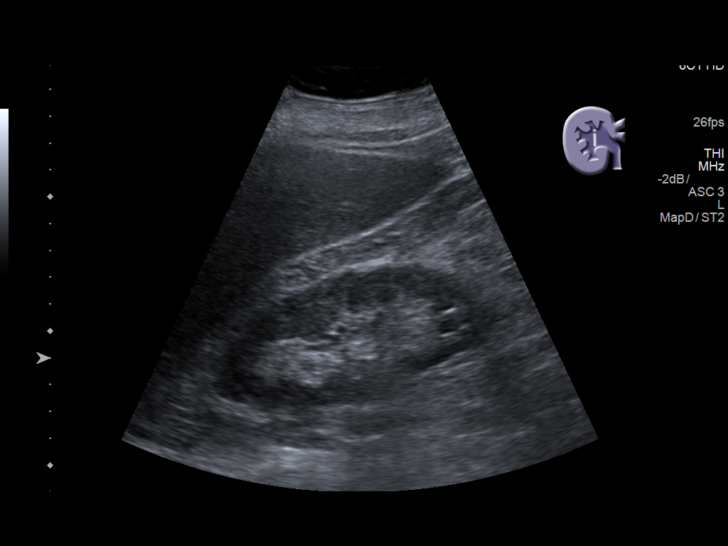
[im 91/136]
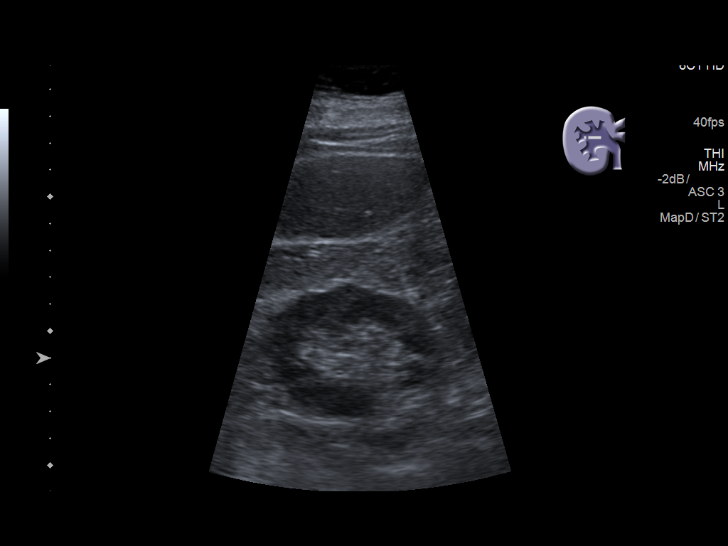
[im 102/136]
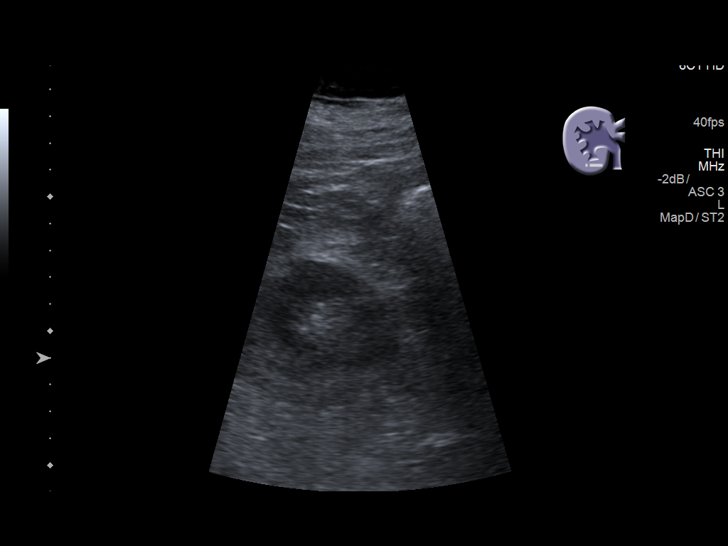
[im 113/136]
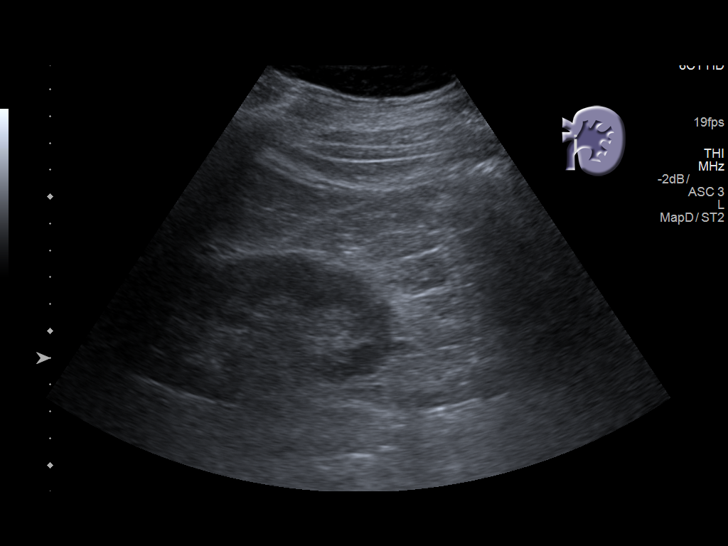
[im 124/136]
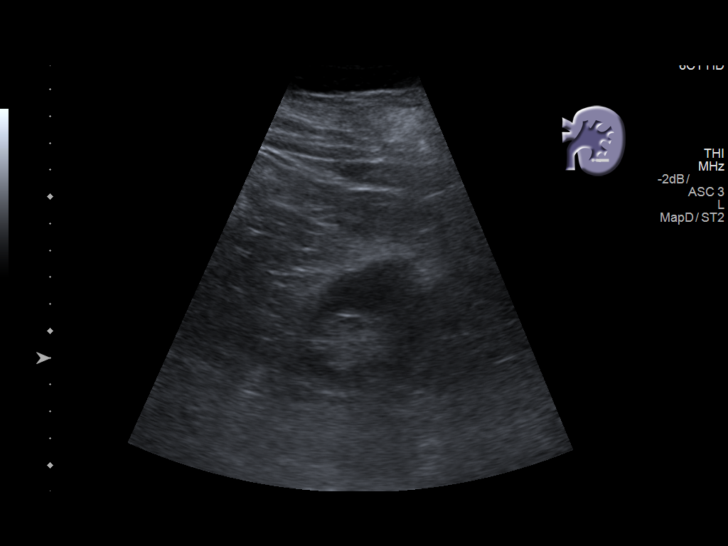
[im 136/136]
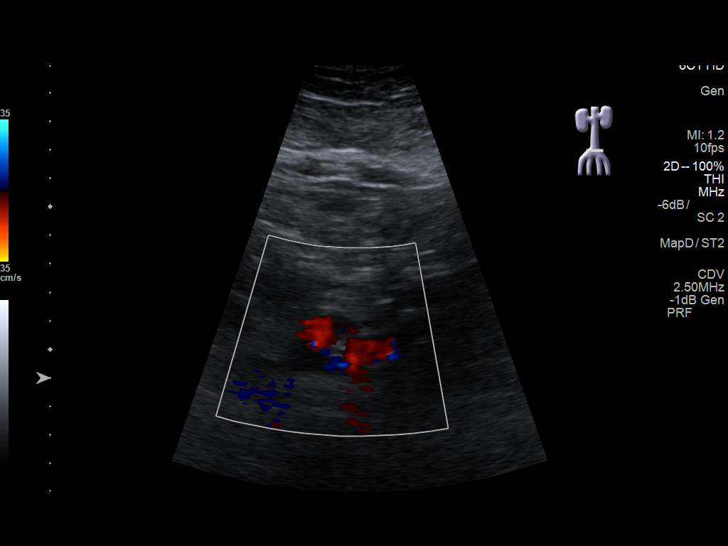

[13 of 25 positions shown; findings below may reference images not displayed]

FINDINGS: Gallbladder: No gallstones or wall thickening visualized. No
sonographic Murphy sign noted by sonographer.

Common bile duct: Diameter: 4 mm

Liver: The hepatic echotexture is increased. There is no focal mass
or ductal dilation. The surface contour of the liver is normal.

IVC: No abnormality visualized.

Pancreas: Bowel gas limits evaluation of the pancreatic head and
tail. The pancreatic body is grossly normal.

Spleen: Size and appearance within normal limits.

Right Kidney: Length: 10.7 cm. The renal cortical echotexture is
normal. In the upper pole there is an exophytic hypoechoic structure
that cannot be definitely set to be a ease simple cyst. There is no
hydronephrosis.

Left Kidney: Length: 11.7 cm. Echogenicity within normal limits. No
mass or hydronephrosis visualized.

Abdominal aorta: The abdominal aorta measures 3.4 cm AP, the mid
aorta measures 2 cm AP, and the distal aorta measures 1.6 cm AP.
Bowel gas obscures portions of the aorta.

Other findings: No ascites is observed.
IMPRESSION: Increased hepatic echotexture compatible with fatty infiltrative
change. Normal appearance of the gallbladder and common bile duct.

Limited visualization of the pancreatic head and tail.

In the right kidney IA hypoechoic exophytic upper pole structure
measuring just under 2 cm in diameter is not clearly a simple cyst.
Renal protocol MRI is recommended.

Mild dilation of the upper abdominal aorta to 3.4 cm AP. The aorta
exhibits a normal tapering caliber.

## 2018-02-26 ENCOUNTER — Other Ambulatory Visit: Payer: Self-pay

## 2018-02-26 MED ORDER — FUROSEMIDE 40 MG PO TABS: ORAL_TABLET | ORAL | 5 refills | Status: DC

## 2018-03-17 LAB — CUP PACEART REMOTE DEVICE CHECK
Battery Remaining Longevity: 52 mo
Brady Statistic AP VS Percent: 97 %
Brady Statistic AS VP Percent: 1 %
Brady Statistic AS VS Percent: 1 %
Brady Statistic RV Percent Paced: 2.7 %
Date Time Interrogation Session: 20191029154735
HIGH POWER IMPEDANCE MEASURED VALUE: 69 Ohm
HighPow Impedance: 69 Ohm
Implantable Lead Implant Date: 20160114
Implantable Lead Location: 753860
Implantable Lead Model: 7122
Implantable Pulse Generator Implant Date: 20160114
Lead Channel Impedance Value: 400 Ohm
Lead Channel Pacing Threshold Amplitude: 0.75 V
Lead Channel Sensing Intrinsic Amplitude: 2.1 mV
Lead Channel Setting Pacing Amplitude: 1.75 V
Lead Channel Setting Sensing Sensitivity: 0.5 mV
MDC IDC LEAD IMPLANT DT: 20160114
MDC IDC LEAD LOCATION: 753859
MDC IDC MSMT BATTERY REMAINING PERCENTAGE: 59 %
MDC IDC MSMT BATTERY VOLTAGE: 2.93 V
MDC IDC MSMT LEADCHNL RA PACING THRESHOLD PULSEWIDTH: 0.5 ms
MDC IDC MSMT LEADCHNL RV IMPEDANCE VALUE: 350 Ohm
MDC IDC MSMT LEADCHNL RV PACING THRESHOLD AMPLITUDE: 1.25 V
MDC IDC MSMT LEADCHNL RV PACING THRESHOLD PULSEWIDTH: 0.5 ms
MDC IDC MSMT LEADCHNL RV SENSING INTR AMPL: 9.8 mV
MDC IDC SET LEADCHNL RV PACING AMPLITUDE: 2.5 V
MDC IDC SET LEADCHNL RV PACING PULSEWIDTH: 0.5 ms
MDC IDC STAT BRADY AP VP PERCENT: 2.7 %
MDC IDC STAT BRADY RA PERCENT PACED: 98 %
Pulse Gen Serial Number: 7241695

## 2018-03-19 ENCOUNTER — Other Ambulatory Visit: Payer: Self-pay | Admitting: Internal Medicine

## 2018-03-20 ENCOUNTER — Telehealth: Payer: Self-pay | Admitting: *Deleted

## 2018-03-20 NOTE — Telephone Encounter (Signed)
Pt takes Eliquis for afib with CHADS2VASc score of 5 (age, HTN, DM, stroke). Stroke is noted in multiple office notes from Dr Caryl Comes, however is not listed under any PMH. Renal function is normal. Recommend only holding Eliquis for 24 hours prior to procedure.

## 2018-03-20 NOTE — Telephone Encounter (Signed)
   [facility] Pre-operative Risk Assessment    Request for surgical clearance:  1. What type of surgery is being performed? COLONOSCOPY & EGD   2. When is this surgery scheduled? 05/11/18   3. What type of clearance is required (medical clearance vs. Pharmacy clearance to hold med vs. Both)? BOTH  4. Are there any medications that need to be held prior to surgery and how long?ELIQUIS   5. Practice name and name of physician performing surgery? [facility]; DR. Loistine Simas   6. What is your office phone number [phone]    7.   What is your office fax number [phone]  8.   Anesthesia type (None, local, MAC, general) ? [place] 03/20/2018, 3:25 PM  _________________________________________________________________   (provider comments below)

## 2018-03-25 ENCOUNTER — Other Ambulatory Visit: Payer: Self-pay | Admitting: Gastroenterology

## 2018-03-25 DIAGNOSIS — K760 Fatty (change of) liver, not elsewhere classified: Secondary | ICD-10-CM

## 2018-03-25 NOTE — Telephone Encounter (Signed)
I called the patient to review his current status. He tells me that he thinks he is having more shortness of breath than he should have. He also is not sure that he could aly flat for the procedure as he sleeps in a recliner. Even though this is a low risk procedure, the patient says he would feel better being seen prior to the procedure.   Of note, the patient had a dental procedure in July which was aborted due to heart "going crazy". ICD was interrogated and no arrhythmias found. The patient was asymptomatic. He feels that he was dehydrated at the time.

## 2018-03-25 NOTE — Telephone Encounter (Signed)
   Primary Cardiologist:Steven Caryl Comes, MD  Chart reviewed as part of pre-operative protocol coverage. Because of Ethan STREETY Jr.'s past medical history and time since last visit, he/she will require a follow-up visit in order to better assess preoperative cardiovascular risk.  Pre-op covering staff: - Please schedule appointment and call patient to inform them. Please try for Dr. Caryl Comes, if not avail pt would like to see me if possible. - Please contact requesting surgeon's office via preferred method (i.e, phone, fax) to inform them of need for appointment prior to surgery.  If applicable, this message will also be routed to pharmacy pool and/or primary cardiologist for input on holding anticoagulant/antiplatelet agent as requested below so that this information is available at time of patient's appointment.   Daune Perch, NP  03/25/2018, 4:57 PM

## 2018-03-25 NOTE — Telephone Encounter (Signed)
Pt is scheduled to see Pecolia Ades NP on 04/23/18. Clearance will be addressed at visit

## 2018-03-30 ENCOUNTER — Ambulatory Visit
Admission: RE | Admit: 2018-03-30 | Discharge: 2018-03-30 | Disposition: A | Discharge status: Home / Self Care | Payer: Medicare Other | Source: Ambulatory Visit | Attending: Gastroenterology | Admitting: Gastroenterology

## 2018-03-30 DIAGNOSIS — K760 Fatty (change of) liver, not elsewhere classified: Secondary | ICD-10-CM | POA: Insufficient documentation

## 2018-04-07 ENCOUNTER — Encounter: Payer: Self-pay | Admitting: Cardiology

## 2018-04-14 ENCOUNTER — Ambulatory Visit (INDEPENDENT_AMBULATORY_CARE_PROVIDER_SITE_OTHER): Payer: Medicare Other

## 2018-04-14 DIAGNOSIS — I5042 Chronic combined systolic (congestive) and diastolic (congestive) heart failure: Secondary | ICD-10-CM

## 2018-04-14 DIAGNOSIS — R9431 Abnormal electrocardiogram [ECG] [EKG]: Secondary | ICD-10-CM

## 2018-04-14 DIAGNOSIS — I428 Other cardiomyopathies: Secondary | ICD-10-CM

## 2018-04-15 ENCOUNTER — Other Ambulatory Visit
Admission: RE | Admit: 2018-04-15 | Discharge: 2018-04-15 | Disposition: A | Discharge status: Home / Self Care | Payer: Medicare Other | Source: Ambulatory Visit | Attending: Ophthalmology | Admitting: Ophthalmology

## 2018-04-15 DIAGNOSIS — H47011 Ischemic optic neuropathy, right eye: Secondary | ICD-10-CM | POA: Insufficient documentation

## 2018-04-15 LAB — CBC WITH DIFFERENTIAL/PLATELET
ABS IMMATURE GRANULOCYTES: 0.01 10*3/uL (ref 0.00–0.07)
BASOS PCT: 1 %
Basophils Absolute: 0.1 10*3/uL (ref 0.0–0.1)
Eosinophils Absolute: 0.2 10*3/uL (ref 0.0–0.5)
Eosinophils Relative: 4 %
HCT: 41.1 % (ref 39.0–52.0)
Hemoglobin: 13.4 g/dL (ref 13.0–17.0)
Immature Granulocytes: 0 %
Lymphocytes Relative: 18 %
Lymphs Abs: 1 10*3/uL (ref 0.7–4.0)
MCH: 31.1 pg (ref 26.0–34.0)
MCHC: 32.6 g/dL (ref 30.0–36.0)
MCV: 95.4 fL (ref 80.0–100.0)
MONO ABS: 0.5 10*3/uL (ref 0.1–1.0)
Monocytes Relative: 9 %
Neutro Abs: 3.5 10*3/uL (ref 1.7–7.7)
Neutrophils Relative %: 68 %
Platelets: 236 10*3/uL (ref 150–400)
RBC: 4.31 MIL/uL (ref 4.22–5.81)
RDW: 12.8 % (ref 11.5–15.5)
WBC: 5.2 10*3/uL (ref 4.0–10.5)
nRBC: 0 % (ref 0.0–0.2)

## 2018-04-15 LAB — SEDIMENTATION RATE: SED RATE: 8 mm/h (ref 0–20)

## 2018-04-15 LAB — C-REACTIVE PROTEIN: CRP: <0.8 mg/dL (ref <1.0)

## 2018-04-15 NOTE — Progress Notes (Signed)
Remote ICD transmission.   

## 2018-04-17 LAB — CUP PACEART REMOTE DEVICE CHECK
Battery Remaining Longevity: 49 mo
Battery Remaining Percentage: 56 %
Battery Voltage: 2.93 V
Brady Statistic AP VP Percent: 2.3 %
Brady Statistic AS VP Percent: 1 %
Brady Statistic RA Percent Paced: 98 %
Brady Statistic RV Percent Paced: 2.3 %
HighPow Impedance: 65 Ohm
HighPow Impedance: 65 Ohm
Implantable Lead Implant Date: 20160114
Implantable Lead Location: 753860
Implantable Lead Model: 7122
Lead Channel Impedance Value: 360 Ohm
Lead Channel Impedance Value: 380 Ohm
Lead Channel Pacing Threshold Pulse Width: 0.5 ms
Lead Channel Pacing Threshold Pulse Width: 0.5 ms
Lead Channel Sensing Intrinsic Amplitude: 9.7 mV
Lead Channel Setting Pacing Amplitude: 1.625
Lead Channel Setting Pacing Amplitude: 2.5 V
MDC IDC LEAD IMPLANT DT: 20160114
MDC IDC LEAD LOCATION: 753859
MDC IDC MSMT LEADCHNL RA PACING THRESHOLD AMPLITUDE: 0.625 V
MDC IDC MSMT LEADCHNL RA SENSING INTR AMPL: 1.3 mV
MDC IDC MSMT LEADCHNL RV PACING THRESHOLD AMPLITUDE: 1.25 V
MDC IDC PG IMPLANT DT: 20160114
MDC IDC PG SERIAL: 7241695
MDC IDC SESS DTM: 20200128091444
MDC IDC SET LEADCHNL RV PACING PULSEWIDTH: 0.5 ms
MDC IDC SET LEADCHNL RV SENSING SENSITIVITY: 0.5 mV
MDC IDC STAT BRADY AP VS PERCENT: 97 %
MDC IDC STAT BRADY AS VS PERCENT: 1 %

## 2018-04-23 ENCOUNTER — Ambulatory Visit: Payer: Medicare HMO | Admitting: Cardiology

## 2018-04-23 ENCOUNTER — Other Ambulatory Visit: Payer: Self-pay | Admitting: Ophthalmology

## 2018-04-23 DIAGNOSIS — H47011 Ischemic optic neuropathy, right eye: Secondary | ICD-10-CM

## 2018-04-28 ENCOUNTER — Ambulatory Visit (INDEPENDENT_AMBULATORY_CARE_PROVIDER_SITE_OTHER): Payer: Medicare Other | Admitting: Internal Medicine

## 2018-04-28 ENCOUNTER — Encounter: Payer: Self-pay | Admitting: Internal Medicine

## 2018-04-28 ENCOUNTER — Ambulatory Visit
Admission: RE | Admit: 2018-04-28 | Payer: Medicare Other | Source: Ambulatory Visit | Attending: Ophthalmology | Admitting: Ophthalmology

## 2018-04-28 ENCOUNTER — Ambulatory Visit
Admission: RE | Admit: 2018-04-28 | Discharge: 2018-04-28 | Disposition: A | Discharge status: Home / Self Care | Payer: Medicare Other | Source: Ambulatory Visit | Attending: Ophthalmology | Admitting: Ophthalmology

## 2018-04-28 VITALS — BP 142/82 | HR 68 | Ht 66.0 in | Wt 241.8 lb

## 2018-04-28 DIAGNOSIS — Z95810 Presence of automatic (implantable) cardiac defibrillator: Secondary | ICD-10-CM | POA: Diagnosis not present

## 2018-04-28 DIAGNOSIS — I5042 Chronic combined systolic (congestive) and diastolic (congestive) heart failure: Secondary | ICD-10-CM | POA: Diagnosis not present

## 2018-04-28 DIAGNOSIS — I469 Cardiac arrest, cause unspecified: Secondary | ICD-10-CM | POA: Diagnosis not present

## 2018-04-28 DIAGNOSIS — I428 Other cardiomyopathies: Secondary | ICD-10-CM

## 2018-04-28 DIAGNOSIS — H47011 Ischemic optic neuropathy, right eye: Secondary | ICD-10-CM | POA: Insufficient documentation

## 2018-04-28 DIAGNOSIS — Z79899 Other long term (current) drug therapy: Secondary | ICD-10-CM

## 2018-04-28 MED ORDER — CARVEDILOL 25 MG PO TABS: 25.0000 mg | ORAL_TABLET | Freq: Two times a day (BID) | ORAL | 3 refills | Status: DC

## 2018-04-28 MED ORDER — LOSARTAN POTASSIUM 100 MG PO TABS: 100.0000 mg | ORAL_TABLET | Freq: Every day | ORAL | 3 refills | Status: DC

## 2018-04-28 MED ORDER — ATORVASTATIN CALCIUM 10 MG PO TABS: 10.0000 mg | ORAL_TABLET | Freq: Every day | ORAL | 3 refills | Status: DC

## 2018-04-28 MED ORDER — APIXABAN 5 MG PO TABS: 5.0000 mg | ORAL_TABLET | Freq: Two times a day (BID) | ORAL | 3 refills | Status: DC

## 2018-04-28 MED ORDER — FUROSEMIDE 40 MG PO TABS: ORAL_TABLET | ORAL | 3 refills | Status: DC

## 2018-04-28 NOTE — Progress Notes (Signed)
Patient Care Team: Jodi Marble, MD as PCP - General (Internal Medicine) Deboraha Sprang, MD as PCP - Cardiology (Cardiology)   HPI  Ethan Vega. is a 73 y.o. male Seen following ICD implantation 1/16 following aborted cardiac arrest. Cardiac evaluation at that time included an echocardiogram demonstrating normal left ventricular function had recovered from the EF of 30-35% range. Catheterization 1/16 demonstrated no significant coronary disease;   We undertook thallium for perfusion defects looking for infiltrative processes  this was abnormal raising the spectre of sarcoid,    CT-high resolution was neg for interstitial/LN   There were small nodules  Eye exam showed apparently no sarcoid  Flecainide challenge was negative ECG  Low Volts R-V1 with RAD with mild RA enlargement SAECG notdone as he was in AFib RVR and rate related RBBB  He has had no atrial fibrillation.  He remains on amiodarone and apixaban and is tolerating the amiodarone without complaints of sensitivity or nausea. NO bleeding on apixaban.  He was hospitalized 12/18 for syncope and was found to be in ventricular tachycardia below his detection rate.  Amiodarone was increased and the device was reprogrammed.  5/19 hospitalized for acute shortness of breath.  Thought to be a combination of COPD and reactive airways disease as well as heart failure.  Treated with inhalers and steroids; he was also seen by pulmonary.  Their notes were reviewed.    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 No ischemia  2/18 ECHO EF 40-45 %      He is complaining of shortness of breath with modest exertion.  Notes just a little bit of edema.  Has orthopnea.  No chest pain.  Underwent a a dermatological procedure that was associated with "eye swelling "with his resolution he had significant loss of vision in his right (good) eye and ophthalmology has noted optic nerve swelling.  He is to go to [facility] Thursday  Past Medical History:  Diagnosis Date  . AICD (automatic cardioverter/defibrillator) present 03/31/14   a. 03/2014 s/p SJM 2411-36C dual chamber AICD (serial Number [id number])- followed by Dr. Caryl Comes  . Arthritis    a. L knee.  . Asthma   . Cancer ([place])    Melanoma resected from scalp  . Cardiac arrest [facility])    a. 03/30/2014 VF arrest in setting of NICM >> CPR/defib in community >> s/p dual chamber AICD  . Coronary artery disease, non-occlusive    a. cath 03/2014 at [facility]: no sig CAD (OM1 30%, CFX 30%), EF 35%  . DM2 (diabetes mellitus, type 2) ([place])    a. Diet controlled.  Marland Kitchen HLD (hyperlipidemia)   . HTN (hypertension)   . Kidney stones   .  NICM (nonischemic cardiomyopathy) ([place])    a. 03/2014 Echo:  Inferolateral and lateral HK, EF 70-01%, grade 1 diastolic dysfunction, mild MR, mild LAE, mild RAE, trivial TR, no effusion.  . Paroxysmal atrial fibrillation ([place]) 03/28/2015  . Retinal artery occlusion     Past Surgical History:  Procedure Laterality Date  . adenomatous polyps    . CARDIAC CATHETERIZATION  03/30/2014  . CARDIOVERSION  03/30/2014  . COLONOSCOPY WITH PROPOFOL N/A 03/24/2017   Procedure: COLONOSCOPY WITH PROPOFOL;  Surgeon: Lollie Sails, MD;  Location: [facility] ENDOSCOPY;  Service: Endoscopy;  Laterality: N/A;  . CYSTOSCOPY W/ LITHOLAPAXY / EHL    . IMPLANTABLE CARDIOVERTER DEFIBRILLATOR IMPLANT N/A 03/31/2014   Procedure: IMPLANTABLE CARDIOVERTER DEFIBRILLATOR IMPLANT;  Surgeon: Evans Lance, MD;  Location: [facility] CATH LAB;  Service: Cardiovascular;  Laterality: N/A;  . JOINT REPLACEMENT    . TONSILLECTOMY      "as a kid"  . TOTAL KNEE ARTHROPLASTY Left ~ 2010  . TOTAL KNEE ARTHROPLASTY WITH REVISION COMPONENTS Left ~ 2011  . VARICOSE VEIN SURGERY Left ~ 2014    Current Outpatient Medications  Medication Sig Dispense Refill  . ADVAIR DISKUS 250-50 MCG/DOSE AEPB INHALE 1 PUFF INTO THE LUNGS 2 (TWO) TIMES DAILY. RINSE MOUTH AFTER USE. 60 each 1  . albuterol (PROVENTIL HFA;VENTOLIN HFA) 108 (90 Base) MCG/ACT inhaler Inhale 2 puffs into the lungs every 6 (six) hours as needed for wheezing or shortness of breath. 1 Inhaler 2  . amiodarone (PACERONE) 200 MG tablet Take 1 tablet (200 mg total) by mouth daily. 90 tablet 3  . apixaban (ELIQUIS) 5 MG TABS tablet Take 1 tablet (5 mg total) by mouth 2 (two) times daily. 180 tablet 3  . atorvastatin (LIPITOR) 10 MG tablet Take 10 mg by mouth daily.    . carvedilol (COREG) 25 MG tablet Take 1 tablet (25 mg total) by mouth 2 (two) times daily with a meal. 180 tablet 3  . furosemide (LASIX) 40 MG tablet Take 1 tablet (40 mg) by mouth once daily 30 tablet 5  . levothyroxine (SYNTHROID, LEVOTHROID) 75 MCG tablet Take 75 mcg by mouth daily.  2  . losartan (COZAAR) 100 MG tablet Take 1 tablet (100 mg total) by mouth daily. 90 tablet 3   No current facility-administered medications for this visit.     No Known Allergies  Review of Systems negative except from HPI and PMH  Physical Exam BP (!) 142/82 (BP Location: Left Arm, Patient Position: Sitting, Cuff Size: Normal)   Pulse 68   Ht 5\' 6"  (1.676 m)   Wt 241 lb 12 oz (109.7 kg)   BMI 39.02 kg/m  Well developed and well nourished in no acute distress HENT normal Neck supple with JVP 8 Clear Device pocket well healed; without hematoma or erythema.  There is no tethering  Regular rate and rhythm, no  gallop 2/6 murmur Abd-soft with active BS No Clubbing cyanosis 2+ edema Skin-warm and dry A & Oriented  Grossly normal sensory and motor function  ECG  A paced @ 60 25/13/46 Low voltage  Assessment and   Plan  Aborted Cardiac arrest//FHx Cardiac Arrest   ICD  St Jude The patient's device was interrogated.  The information was reviewed. No changes were made in the programming.     Hypertension  Sinus bradycardia/chronotropic incompetence  Low Voltage ECG  Atrial Fibrillation   Retinal artery occlusion  Renal insufficiency, volume sensitive  Congestive heart failure- chronic-systolic/diastolic  Visual loss  Tolerating amiodarone.  However, amiodarone has been rarely associated with optic neuritis.  I do not know enough about the ophthalmological examinations to know whether this could be a consideration; we will hold his amiodarone until he is seen by ophthalmology again and can be cleared to resume his amiodarone.  He is volume overloaded.  We will increase his furosemide from 40--80 to be done 5 days out of the next 7.  He will then resume 40 mg a day.  On Anticoagulation;  No bleeding issues   Renal function stable

## 2018-04-28 NOTE — Patient Instructions (Addendum)
Medication Instructions:  - Your physician has recommended you make the following change in your medication:   1) INCREASE lasix (furosemide) 40 mg- take 2 tablets (80 mg) by mouth once daily 5 out of the next 7 days, then resume 40 mg once daily  2) HOLD amiodarone until seen by [facility] opthalmology  If you need a refill on your cardiac medications before your next appointment, please call your pharmacy.   Lab work: - Your physician recommends that you have lab work : BMP/ TSH   If you have labs (blood work) drawn today and your tests are completely normal, you will receive your results only by: Marland Kitchen MyChart Message (if you have MyChart) OR . A paper copy in the mail If you have any lab test that is abnormal or we need to change your treatment, we will call you to review the results.  Testing/Procedures: - none ordered  Follow-Up: At [facility], you and your health needs are our priority.  As part of our continuing mission to provide you with exceptional heart care, we have created designated Provider Care Teams.  These Care Teams include your primary Cardiologist (physician) and Advanced Practice Providers (APPs -  Physician Assistants and Nurse Practitioners) who all work together to provide you with the care you need, when you need it. . in 3 months with Dr. Caryl Comes  Any Other Special Instructions Will Be Listed Below (If Applicable). - Please ask [facility] ophthalmology about resuming amiodarone when you see them.

## 2018-04-29 ENCOUNTER — Telehealth: Payer: Self-pay | Admitting: *Deleted

## 2018-04-29 LAB — BASIC METABOLIC PANEL
BUN / CREAT RATIO: 15 (ref 10–24)
BUN: 15 mg/dL (ref 8–27)
CHLORIDE: 104 mmol/L (ref 96–106)
CO2: 22 mmol/L (ref 20–29)
Calcium: 9.3 mg/dL (ref 8.6–10.2)
Creatinine, Ser: 1 mg/dL (ref 0.76–1.27)
GFR calc non Af Amer: 74 mL/min/{1.73_m2} (ref >59)
GFR, EST AFRICAN AMERICAN: 85 mL/min/{1.73_m2} (ref >59)
GLUCOSE: 105 mg/dL — AB (ref 65–99)
POTASSIUM: 4.3 mmol/L (ref 3.5–5.2)
SODIUM: 144 mmol/L (ref 134–144)

## 2018-04-29 LAB — TSH: TSH: 2.89 u[IU]/mL (ref 0.450–4.500)

## 2018-04-29 NOTE — Telephone Encounter (Signed)
   [facility] Pre-operative Risk Assessment    Request for surgical clearance:  1. What type of surgery is being performed? COLONOSCOPY & EGD   2. When is this surgery scheduled? 05/11/18   3. What type of clearance is required (medical clearance vs. Pharmacy clearance to hold med vs. Both)? BOTH  4. Are there any medications that need to be held prior to surgery and how long?ELIQUIS HOLD FOR 48 HOURS PRIOR   5. Practice name and name of physician performing surgery? [facility]; DR. Loistine Simas   6. What is your office phone number [phone]    7.   What is your office fax number [phone]  8.   Anesthesia type (None, local, MAC, general) ? MONITORED ANESTHESIA    Ethan Vega 04/29/2018, 2:16 PM  _________________________________________________________________   (provider comments below)

## 2018-04-29 NOTE — Telephone Encounter (Signed)
Patient with diagnosis of Afib on Eliquis for anticoagulation.    Procedure: colonoscopy and EGD Date of procedure: 05/11/18  CHADS2-VASc score of  6 (CHF, HTN, AGE, DM2, stroke/tia x 2, CAD, AGE, male)  CrCl 158ml/min  Per office protocol, patient can hold Eliquis for 24 hours prior to procedure.

## 2018-04-30 ENCOUNTER — Telehealth: Payer: Self-pay | Admitting: Internal Medicine

## 2018-04-30 ENCOUNTER — Other Ambulatory Visit (HOSPITAL_COMMUNITY): Payer: Self-pay | Admitting: Ophthalmology

## 2018-04-30 DIAGNOSIS — H47011 Ischemic optic neuropathy, right eye: Secondary | ICD-10-CM

## 2018-04-30 NOTE — Telephone Encounter (Signed)
Please call regarding Amiodarone. Pt states he spoke with his doctor at [facility]  Dr. Renae Fickle yesterday and would like to discuss this .

## 2018-05-01 NOTE — Telephone Encounter (Signed)
   Primary Cardiologist: Virl Axe, MD  Chart reviewed as part of pre-operative protocol coverage. Patient was contacted 05/01/2018 in reference to pre-operative risk assessment for pending surgery as outlined below.  Ethan Vega. was last seen on 04/28/18 by Dr. Caryl Comes.  Since that day, Ethan Mceuen. has done well. He can complete greater than 4.0 METS. He denies any new anginal symptoms or changes to his medical history.  Per our pharmacy staff: Patient with diagnosis of Afib on Eliquis for anticoagulation.    Procedure: colonoscopy and EGD Date of procedure: 05/11/18  CHADS2-VASc score of  6 (CHF, HTN, AGE, DM2, stroke/tia x 2, CAD, AGE, male)  CrCl 152ml/min  Per office protocol, patient can hold Eliquis for 24 hours prior to procedure.   Therefore, based on ACC/AHA guidelines, the patient would be at acceptable risk for the planned procedure without further cardiovascular testing.   I will route this recommendation to the requesting party (Dr. Gustavo Lah) via [place] fax function and remove from pre-op pool.  Please call with questions.  [place], PA 05/01/2018, 12:52 PM

## 2018-05-01 NOTE — Telephone Encounter (Signed)
Left VM to call back 

## 2018-05-01 NOTE — Telephone Encounter (Signed)
Spoke with patient and he went to [facility] and they told him to continue holding that amiodarone. Patient calling back wanting to know if there is another medication he should take in the place of the amiodarone. Advised that I would send message to provider and we would be in touch with him regarding any further recommendations. He verbalized understanding with no further questions.

## 2018-05-01 NOTE — Telephone Encounter (Signed)
Patient calling to check on the status  Would like to discuss a possible replacement for amiodarone  Please call to discuss

## 2018-05-05 NOTE — Telephone Encounter (Signed)
Noted  

## 2018-05-05 NOTE — Telephone Encounter (Signed)
I spoke with the patient. He is aware of Dr. Olin Pia recommendations to continue to stay off of amiodarone. Per Dr. Caryl Comes, he does not want to replace his amiodarone with anything at this time.  I have advised the patient is she feels dizzy/ lightheaded/ chest pain, or any symptoms concerning to him, to please call the office and we can walk him through how to send a manuel transmission so we can look at his heart rhythm.  The patient voices understanding and is agreeable.

## 2018-05-11 NOTE — Telephone Encounter (Signed)
noted 

## 2018-05-15 ENCOUNTER — Telehealth: Payer: Self-pay | Admitting: *Deleted

## 2018-05-15 NOTE — Telephone Encounter (Signed)
Alert received for VT-1 zone episode from 05/14/18 at 16:40 treated with ATP x1, VT fell below detection rate with late break and return to Ap/Vp rhythm. Presenting rhythm as of 05/15/18 at 02:19 shows Ap/Vs rhythm at ~65bpm. VT-1 detection currently programmed at 144bpm. Amiodarone d/c by Dr. Caryl Comes on 04/28/18.  Spoke with patient. He was asymptomatic with episode, was watching TV at the time. Denies any symptoms at this time. Reports compliance with medications. Advised patient of [place] DMV driving restrictions x6 months. Will review episode with Dr. Caryl Comes when he is back in the office. Patient verbalizes understanding of all instructions and agrees to seek emergency medical attention over the weekend for syncope, ICD shocks, or other cardiac symptoms.

## 2018-05-18 NOTE — Telephone Encounter (Signed)
Reviewed episode with Dr. Toney Rakes changes at this time, plan to monitor frequency of VT episodes from now until 3 month f/u appt on 07/21/18. Patient verbalizes understanding and denies additional questions or concerns at this time.

## 2018-05-19 ENCOUNTER — Other Ambulatory Visit: Payer: Self-pay | Admitting: Internal Medicine

## 2018-05-22 ENCOUNTER — Other Ambulatory Visit
Admission: RE | Admit: 2018-05-22 | Discharge: 2018-05-22 | Disposition: A | Discharge status: Home / Self Care | Payer: Medicare Other | Source: Ambulatory Visit | Attending: Internal Medicine | Admitting: Internal Medicine

## 2018-05-22 ENCOUNTER — Telehealth: Payer: Self-pay | Admitting: Nurse Practitioner

## 2018-05-22 DIAGNOSIS — I472 Ventricular tachycardia, unspecified: Secondary | ICD-10-CM

## 2018-05-22 LAB — BASIC METABOLIC PANEL
ANION GAP: 9 (ref 5–15)
BUN: 21 mg/dL (ref 8–23)
CO2: 29 mmol/L (ref 22–32)
Calcium: 9 mg/dL (ref 8.9–10.3)
Chloride: 105 mmol/L (ref 98–111)
Creatinine, Ser: 1.09 mg/dL (ref 0.61–1.24)
GFR calc Af Amer: >60 mL/min (ref >60)
GFR calc non Af Amer: >60 mL/min (ref >60)
Glucose, Bld: 106 mg/dL — ABNORMAL HIGH (ref 70–99)
Potassium: 3.7 mmol/L (ref 3.5–5.1)
Sodium: 143 mmol/L (ref 135–145)

## 2018-05-22 LAB — MAGNESIUM: Magnesium: 2.1 mg/dL (ref 1.7–2.4)

## 2018-05-22 NOTE — Telephone Encounter (Signed)
Pt with 36 episodes of VT all treated with ATP on 05/21/18. See prior phone notes regarding VT. Will route to Dr Caryl Comes for review. Pt previously notified of driving restrictions. Amio discontinued 04/30/18. In several episodes, ATP failed to terminate VT but device declared "return to sinus". Presenting rhythm AP/VS.

## 2018-05-22 NOTE — Telephone Encounter (Signed)
I spoke with the patient. He will come in to the [place] at [facility] this morning for a BMP/ Magnesium level to be drawn.   He is aware that he is not to drive for 6 months when he has ATP.  The patient voiced understanding and states he will arrange for a ride to the hospital this morning.   He is aware that I will call him back once lab results are received. Again the patient confirms he has been aysymptomatic with his rhythm. I have advised if he becomes dizzy/ lightheaded/ pre-syncopal/ has syncope, he needs to report to the ER.   He is currently off amiodarone due to an episode of vision loss that he experienced last month.

## 2018-05-22 NOTE — Telephone Encounter (Signed)
Nira Conn  Can you please arrange for him to come in next Tuesday as we will need to reprogram his device Thanks

## 2018-05-22 NOTE — Telephone Encounter (Signed)
Spoke with pt this morning to discuss his recent VT event. He states he was watching television, asymptomatic. Upon reviewing his medication compliance, he stated he was taking 80mg  lasix qd since his last OV on 2/11. Pt should have began his normal dose of 40mg  lasix on 2/18 as he was instructed to take 80mg  qod x 7 days.   I advised pt I would contact the [place] office to have a BMP drawn to check K+ given his recent VT episode to rule out hypokalemia. Pt will await a call from the [place] office to arrange.

## 2018-05-22 NOTE — Telephone Encounter (Signed)
I spoke with the patient regarding his BMP/ Magnesium level preliminary results. K+ 3.7- advised he decrease his lasix to 40 mg once daily as he was previously instructed to do at his last office visit.  He is aware that Dr. Caryl Comes wants to follow up with him on Tuesday 3/10 in order to reprogram his device.  The patient voices understanding of the above and is agreeable.   Appt scheduled 3/10 @ 9:00 am with Dr. Caryl Comes.

## 2018-05-25 ENCOUNTER — Telehealth: Payer: Self-pay | Admitting: Internal Medicine

## 2018-05-25 NOTE — Telephone Encounter (Signed)
Spoke w/ pt and he informed me that he received a shock from his ICD around 0230 Saturday. Remote transmission received. Patient states that he didn't have any symptoms after the shock. I informed pt of driving restrictions and informed him that Device Tech RN will review and call him back. Patient has an appt on Tuesday 05-26-2018 at 0900 with Dr. Caryl Comes at the [place] office.

## 2018-05-25 NOTE — Telephone Encounter (Signed)
Pt states on Saturday his defibrillator went off, states it only went off for about a second. States he had no symptoms after the shock. States he felt better. Pt has an appt with Dr.Klein tomorrow 3/10

## 2018-05-25 NOTE — Telephone Encounter (Signed)
Spoke with patient, reviewed transmission with appropriate HV therapy. He is aware of driving restrictions. Felt "great" after shock. He is aware of appt tomorrow with Dr Caryl Comes and will keep that appt.  Chanetta Marshall, NP 05/25/2018 10:50 AM

## 2018-05-26 ENCOUNTER — Encounter: Payer: Self-pay | Admitting: Internal Medicine

## 2018-05-26 ENCOUNTER — Ambulatory Visit: Payer: Medicare Other | Admitting: Internal Medicine

## 2018-05-26 VITALS — BP 118/66 | HR 60 | Ht 66.0 in | Wt 240.5 lb

## 2018-05-26 DIAGNOSIS — I472 Ventricular tachycardia, unspecified: Secondary | ICD-10-CM

## 2018-05-26 DIAGNOSIS — R9431 Abnormal electrocardiogram [ECG] [EKG]: Secondary | ICD-10-CM

## 2018-05-26 MED ORDER — MEXILETINE HCL 200 MG PO CAPS: 200.0000 mg | ORAL_CAPSULE | Freq: Two times a day (BID) | ORAL | 6 refills | Status: DC

## 2018-05-26 MED ORDER — LOSARTAN POTASSIUM 50 MG PO TABS: 50.0000 mg | ORAL_TABLET | Freq: Every day | ORAL | 6 refills | Status: DC

## 2018-05-26 NOTE — Patient Instructions (Addendum)
Medication Instructions:  - Your physician has recommended you make the following change in your medication:   1) DECREASE cozaar (losartan) to 50 mg- take 1 tablet by mouth once daily  2) START mexiletine 200 mg- take 1 capsule by mouth twice daily  If you need a refill on your cardiac medications before your next appointment, please call your pharmacy.   Lab work: - none ordered  If you have labs (blood work) drawn today and your tests are completely normal, you will receive your results only by: Marland Kitchen MyChart Message (if you have MyChart) OR . A paper copy in the mail If you have any lab test that is abnormal or we need to change your treatment, we will call you to review the results.  Testing/Procedures: - Your physician has requested that you have an echocardiogram [employer] office). Echocardiography is a painless test that uses sound waves to create images of your heart. It provides your doctor with information about the size and shape of your heart and how well your heart's chambers and valves are working. This procedure takes approximately one hour. There are no restrictions for this procedure.  - Your physician has recommended that myocardial amyloid imaging scan Monday 06/08/2018 @ 1:00 pm (arrive at 12:45 pm for check in)  This will be done at our office in [place] on [facility].  [address] [place], [place] [address] [phone]  Follow-Up: At [facility], you and your health needs are our priority.  As part of our continuing mission to provide you with exceptional heart care, we have created designated Provider Care Teams.  These Care Teams include your primary Cardiologist (physician) and Advanced Practice Providers (APPs -  Physician Assistants and Nurse Practitioners) who all work together to provide you with the care you need, when you need it. Marland Kitchen as scheduled with Dr. Caryl Comes- 07/21/2018  Any Other Special Instructions Will Be Listed Below (If  Applicable). - N/A

## 2018-05-26 NOTE — Progress Notes (Signed)
Patient Care Team: Jodi Marble, MD as PCP - General (Internal Medicine) Deboraha Sprang, MD as PCP - Cardiology (Cardiology)   HPI  Ethan Vega. is a 73 y.o. male Seen following ICD implantation 1/16 following aborted cardiac arrest. Cardiac evaluation at that time included an echocardiogram demonstrating normal left ventricular function had recovered from the EF of 30-35% range. Catheterization 1/16 demonstrated no significant coronary disease;   We undertook thallium for perfusion defects looking for infiltrative processes  this was abnormal raising the spectre of sarcoid,    CT-high resolution was neg for interstitial/LN   There were small nodules  Flecainide challenge was negative ECG  Low Volts R-V1 with RAD with mild RA enlargement SAECG notdone as he was in AFib RVR and rate related RBBB  He has had no atrial fibrillation.  He remains on amiodarone and apixaban and is tolerating the amiodarone without complaints of sensitivity or nausea. NO bleeding on apixaban.  He was hospitalized 12/18 for syncope and was found to be in ventricular tachycardia below his detection rate.  Amiodarone was increased and the device was reprogrammed.  Seen 2/20 with visual loss at which time we stopped amiodarone 2/2 concern for optic neuritis and ophthalmology agreed-- off amio  Seen today because of recurrent VT--multiple episodes since stopping amio, many of which have not been terminated by ATP,, but therapies have been stopped as VT has fallen again below detection  He has persistent sob with and without much exertion.  Shortness of breath improved considerably following his shock last weekend.    No edema as he had been taking Lasix 80.  Electrolytes as below were normal.   No orthopnea.  Denies chest pain.     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 No ischemia  2/18 ECHO EF 40-45 %            Past Medical History:  Diagnosis Date  . AICD (automatic cardioverter/defibrillator) present 03/31/14   a. 03/2014 s/p SJM 2411-36C dual chamber AICD (serial Number [id number])- followed by Dr. Caryl Comes  . Arthritis    a. L knee.  . Asthma   . Cancer ([place])    Melanoma resected from scalp  . Cardiac arrest [facility])    a. 03/30/2014 VF arrest in setting of NICM >> CPR/defib in community >> s/p dual chamber AICD  . Coronary artery disease, non-occlusive    a. cath 03/2014 at [facility]: no sig CAD (OM1 30%, CFX 30%), EF 35%  . DM2 (diabetes mellitus, type 2) ([place])    a. Diet controlled.  Marland Kitchen HLD (hyperlipidemia)   . HTN (hypertension)   . Kidney stones   . NICM (nonischemic cardiomyopathy) ([place])    a. 03/2014 Echo:  Inferolateral and lateral HK, EF 10-93%, grade 1 diastolic dysfunction, mild MR, mild LAE,  mild RAE, trivial TR, no effusion.  . Paroxysmal atrial fibrillation ([place]) 03/28/2015  . Retinal artery occlusion     Past Surgical History:  Procedure Laterality Date  . adenomatous polyps    . CARDIAC CATHETERIZATION  03/30/2014  . CARDIOVERSION  03/30/2014  . COLONOSCOPY WITH PROPOFOL N/A 03/24/2017   Procedure: COLONOSCOPY WITH PROPOFOL;  Surgeon: Lollie Sails, MD;  Location: [facility] ENDOSCOPY;  Service: Endoscopy;  Laterality: N/A;  . CYSTOSCOPY W/ LITHOLAPAXY / EHL    . IMPLANTABLE CARDIOVERTER DEFIBRILLATOR IMPLANT N/A 03/31/2014   Procedure: IMPLANTABLE CARDIOVERTER DEFIBRILLATOR IMPLANT;  Surgeon: Evans Lance, MD;  Location: [facility] CATH LAB;  Service: Cardiovascular;  Laterality: N/A;  . JOINT REPLACEMENT    . TONSILLECTOMY     "as a kid"  . TOTAL KNEE ARTHROPLASTY Left ~ 2010  . TOTAL KNEE ARTHROPLASTY WITH  REVISION COMPONENTS Left ~ 2011  . VARICOSE VEIN SURGERY Left ~ 2014    Current Outpatient Medications  Medication Sig Dispense Refill  . ADVAIR DISKUS 250-50 MCG/DOSE AEPB INHALE 1 PUFF INTO THE LUNGS 2 (TWO) TIMES DAILY. RINSE MOUTH AFTER USE. 60 each 1  . albuterol (PROVENTIL HFA;VENTOLIN HFA) 108 (90 Base) MCG/ACT inhaler Inhale 2 puffs into the lungs every 6 (six) hours as needed for wheezing or shortness of breath. 1 Inhaler 2  . apixaban (ELIQUIS) 5 MG TABS tablet Take 1 tablet (5 mg total) by mouth 2 (two) times daily. 180 tablet 3  . atorvastatin (LIPITOR) 10 MG tablet Take 1 tablet (10 mg total) by mouth daily. 90 tablet 3  . carvedilol (COREG) 25 MG tablet Take 1 tablet (25 mg total) by mouth 2 (two) times daily with a meal. 180 tablet 3  . furosemide (LASIX) 40 MG tablet Take 1 tablet (40 mg) by mouth once daily 90 tablet 3  . levothyroxine (SYNTHROID, LEVOTHROID) 75 MCG tablet Take 75 mcg by mouth daily.  2  . losartan (COZAAR) 100 MG tablet Take 1 tablet (100 mg total) by mouth daily. 90 tablet 3   No current facility-administered medications for this visit.     No Known Allergies  Review of Systems negative except from HPI and PMH  Physical Exam BP 118/66 (BP Location: Left Arm, Patient Position: Sitting, Cuff Size: Normal)   Pulse 60   Ht 5\' 6"  (1.676 m)   Wt 240 lb 8 oz (109.1 kg)   BMI 38.82 kg/m  Well developed and well nourished in no acute distress HENT normal Neck supple with JVP-6-7  Clear Device pocket well healed; without hematoma or erythema.  There is no tethering  Regular rate and rhythm, no  gallop No murmur Abd-soft with active BS No Clubbing cyanosis  edema Skin-warm and dry A & Oriented  Grossly normal sensory and motor function  ECG Apacing 60 14/09/34 LVLL   Assessment and  Plan  Aborted Cardiac arrest//FHx Cardiac Arrest   ICD  St Jude The patient's device was interrogated.  The information was reviewed. No changes were made in the  programming.     Hypertension  Sinus bradycardia/chronotropic incompetence  Low Voltage ECG  Atrial Fibrillation   Retinal artery occlusion  Renal insufficiency, volume sensitive  Congestive heart failure- chronic-systolic/diastolic  Visual loss   Recurrent ventricular tachycardia often occurring below his detection zone.  The device has been reprogrammed to increase the number of bursts, to decrease the coupling interval, and to lower the detection rate from 144--127.  As the amiodarone wears off, VT rate should increase.  In the interim, we will use mexiletine.  Alternatives would be sotalol either by itself or in addition, ranolazine as an adjunct.  I am impressed by his low voltage ECG.  Perhaps I should have been impressed by it a few years ago.  Echocardiogram shows mild LVH.  We will recheck his echocardiogram with all of ventricular tachycardia in the interim; we also undertake a pyrophosphate scan looking for amyloid.  He is euvolemic.  Ongoing dyspnea may be related to mild volume overload not evident on the right side; may also be related to his reactive airways disease.  Encouraged him to follow-up with pulmonary More than 50% of 40 min was spent in counseling related to the above

## 2018-05-28 ENCOUNTER — Telehealth: Payer: Self-pay | Admitting: *Deleted

## 2018-05-28 NOTE — Telephone Encounter (Signed)
Late entry due to Epic downtime:  Spoke with patient regarding Merlin alert received for VT-1 episode on 05/27/18 at 17:47 treated with ATP x8, ATP x4 (all unsuccessful), and converted with 30J shock x1. Patient reports compliance with medications, though he had just taken his first dose of mexiletine on 3/11 about an hour before this episode (his pharmacy had to special-order the medication). Patient denies any specific symptoms during episode, but reports he felt "great" after the shock. Patient is aware of [place] DMV driving restrictions x6 months.  Initially, plan was to have patient proceed to [facility] or [facility] for sotalol initiation. However, after discussing with Dr. Caryl Comes, plan to wait and see how patient does on mexiletine. Patient verbalizes understanding of instructions to call our office or seek emergency medical attention for any device shocks, presyncope, or syncope. He denies any additional questions or concerns at this time.  Episode onset and termination strips included below:

## 2018-05-29 ENCOUNTER — Encounter: Payer: Self-pay | Admitting: *Deleted

## 2018-05-29 NOTE — Progress Notes (Signed)
Merlin alert received for VT-1 episode on 05/28/18 at 09:25, ATP x2 unsuccessful, event terminated with third round of ATP. Per phone note from 05/28/18, patient is aware of driving restrictions and compliant with medications, including mexiletine (started on 05/27/18 with PM dose). Pt is also aware to call if symptomatic with episodes.  Note routed to Dr. Caryl Comes for review and recommendations.   \

## 2018-05-29 NOTE — Progress Notes (Signed)
Will follow for now --- recurrent VT after tomorrow will recommend admission for sotalol

## 2018-06-02 ENCOUNTER — Telehealth (HOSPITAL_COMMUNITY): Payer: Self-pay | Admitting: *Deleted

## 2018-06-02 ENCOUNTER — Telehealth: Payer: Self-pay | Admitting: Internal Medicine

## 2018-06-02 NOTE — Telephone Encounter (Signed)
Patient calling Patient has a procedure/test scheduled for Monday 3/23 Would like advice on whether he should continue with appointment or not due to COVID - 19 Please call to discuss

## 2018-06-02 NOTE — Telephone Encounter (Signed)
Left message on voicemail per DPR in reference to upcoming appointment scheduled on 06/08/18 at 1300 with detailed instructions given per Myocardial Amyloid Study Information Sheet for the test. LM to arrive 15 minutes early, and that it is imperative to arrive on time for appointment to keep from having the test rescheduled. If you need to cancel or reschedule your appointment, please call the office within 24 hours of your appointment. Failure to do so may result in a cancellation of your appointment, and a $50 no show fee. Phone number given for call back for any questions. Travontae Freiberger, [place]

## 2018-06-03 LAB — CUP PACEART INCLINIC DEVICE CHECK
Brady Statistic RA Percent Paced: 98 %
Brady Statistic RV Percent Paced: 2.3 %
Date Time Interrogation Session: 20200211180334
HighPow Impedance: 63 Ohm
Implantable Lead Implant Date: 20160114
Implantable Lead Implant Date: 20160114
Implantable Lead Location: 753860
Implantable Pulse Generator Implant Date: 20160114
Lead Channel Impedance Value: 350 Ohm
Lead Channel Impedance Value: 362.5 Ohm
Lead Channel Pacing Threshold Amplitude: 0.5 V
Lead Channel Pacing Threshold Amplitude: 1.25 V
Lead Channel Pacing Threshold Pulse Width: 0.5 ms
Lead Channel Pacing Threshold Pulse Width: 0.6 ms
Lead Channel Sensing Intrinsic Amplitude: 10.5 mV
Lead Channel Setting Pacing Amplitude: 1.5 V
Lead Channel Setting Pacing Amplitude: 2.5 V
Lead Channel Setting Pacing Pulse Width: 0.6 ms
Lead Channel Setting Sensing Sensitivity: 0.5 mV
MDC IDC LEAD LOCATION: 753859
MDC IDC MSMT BATTERY REMAINING LONGEVITY: 50 mo
MDC IDC MSMT LEADCHNL RA SENSING INTR AMPL: 2.5 mV
Pulse Gen Serial Number: 7241695

## 2018-06-03 NOTE — Telephone Encounter (Signed)
I spoke with the patient. I have advised him that I am going to cancel his myocardial amyloid scan scheduled for Monday. I feel it is in his best interest to not come in to the office for this at this time.  He is aware I will call him back ~ the 2nd week of April to reschedule this. Hopefully we can get this done prior to his follow up with Dr. Caryl Comes.   The patient voices understanding and is agreeable.

## 2018-06-04 ENCOUNTER — Encounter (HOSPITAL_COMMUNITY): Payer: Medicare Other

## 2018-06-08 ENCOUNTER — Encounter: Payer: Self-pay | Admitting: Internal Medicine

## 2018-06-08 ENCOUNTER — Ambulatory Visit (HOSPITAL_COMMUNITY): Payer: Medicare Other

## 2018-06-08 LAB — CUP PACEART INCLINIC DEVICE CHECK
Implantable Lead Implant Date: 20160114
Implantable Lead Implant Date: 20160114
Implantable Lead Location: 753859
Implantable Lead Location: 753860
Implantable Lead Model: 7122
Implantable Pulse Generator Implant Date: 20160114
MDC IDC SESS DTM: 20200323131421
Pulse Gen Serial Number: 7241695

## 2018-06-08 NOTE — Progress Notes (Signed)
Received Merlin alert for VT-1 episode treated with ATP x1 on 06/05/18. Patient is already aware of driving restrictions per previous phone notes. Per Dr. Caryl Comes, continue current medications and continue to monitor. If VT episodes become more frequent, consider increasing mexiletine per Dr. Caryl Comes. Last treated episode prior to this episode occurred on 05/28/18.

## 2018-06-12 NOTE — Progress Notes (Signed)
Received Merlin alert for VT-1 episode treated with ATP x1 on 06/11/18. Patient is already aware of driving restrictions per previous phone notes. Per previous discussion with Dr. Juluis Pitch current medications and continue to monitor. If VT episodes become more frequent, consider increasing mexiletine per Dr. Caryl Comes. Last treated episode prior to this episode occurred on 06/05/18.

## 2018-06-13 ENCOUNTER — Encounter: Payer: Self-pay | Admitting: Internal Medicine

## 2018-06-13 NOTE — Progress Notes (Signed)
Merlin alert for episode of VT treated with ATP 3/27 (occurred after that last transmission came through). Do not see onset of tachycardia. Terminated with 1 round of ATP. Will route to Dr Caryl Comes for review.

## 2018-06-14 NOTE — Progress Notes (Signed)
contine to follow

## 2018-06-14 NOTE — Progress Notes (Signed)
Lets try sotalol  And we will cheat and do it at home with his ICD  His K will need repletion and then lets try 120 bid

## 2018-06-15 NOTE — Progress Notes (Signed)
Dr. Caryl Comes- Amber sent this to me.  Please clarify:  1) How do you want to supplement his potassium?  2) Should he be on potassium a couple of days prior to starting sotalol 120 mg BID or simultaneous?   3) Is he going to stop mexiletine or stay on this?

## 2018-06-16 NOTE — Progress Notes (Signed)
KCL 20 meq daily In 48 hr begin sotalol 80 bid And continue mexilitene

## 2018-06-18 ENCOUNTER — Other Ambulatory Visit: Payer: Self-pay | Admitting: *Deleted

## 2018-06-18 MED ORDER — SOTALOL HCL 80 MG PO TABS: 80.0000 mg | ORAL_TABLET | Freq: Two times a day (BID) | ORAL | 6 refills | Status: DC

## 2018-06-18 MED ORDER — POTASSIUM CHLORIDE CRYS ER 20 MEQ PO TBCR: 20.0000 meq | EXTENDED_RELEASE_TABLET | Freq: Every day | ORAL | 6 refills | Status: DC

## 2018-06-18 NOTE — Progress Notes (Signed)
I spoke with the patient regarding Dr. Olin Pia recommendations to:  1) Start potassium 20 meq once daily  2) then after 48 hours on potassium,  Start sotalol 80 mg BID  3) Stay on mexiletine  The patient is also aware that I spoke with Raquel Sarna, RN in device clinic yesterday and she states that his last episode or ATP was on 3/31. The patient is aware he is not to drive for 6 months from the date of his last ATP episode.   The patient voices understanding of the above plan of care and is agreeable.   He confirms he is not driving and has not left his house in some time from due to COVID-19.  I advised the patient to please continue to stay home if at all possible to avoid any exposure.

## 2018-06-22 ENCOUNTER — Encounter: Payer: Self-pay | Admitting: Internal Medicine

## 2018-06-22 NOTE — Progress Notes (Signed)
Noted H  Did he start his sotalol  Thanks s

## 2018-06-22 NOTE — Progress Notes (Signed)
Received Merlin alert for VT-1 episode on 06/20/18 at 0320, terminated with ATP x3. Appears from notes that patient was to start on sotalol 80mg  BID beginning on 06/20/18. Will continue to monitor, routed to Dr. Caryl Comes as Juluis Rainier.

## 2018-06-22 NOTE — Progress Notes (Signed)
I spoke with the patient. He states that he took his potassium x 48 hours. He took his first dose of sotalol this morning. Notified patient of ATP on 4/4.   The patient was previously made aware of driving restrictions last week. He states today that he is scared to death to leave his house, so he has not been out anywhere. [facility] was able to deliver his meds to him!

## 2018-06-22 NOTE — Progress Notes (Signed)
Noted  

## 2018-06-22 NOTE — Progress Notes (Signed)
LMOVM for patient. Will clarify if patient started sotalol as instructed on 06/20/18.

## 2018-06-29 ENCOUNTER — Other Ambulatory Visit: Payer: Medicare Other

## 2018-07-01 ENCOUNTER — Telehealth: Payer: Self-pay

## 2018-07-01 NOTE — Telephone Encounter (Signed)
Virtual Visit Pre-Appointment Phone Call    Confirm consent - "In the setting of the current Covid19 crisis, you are scheduled for a TELEPHONEvisit with your provider on 07/21/2018 at 9:30.  Just as we do with many in-office visits, in order for you to participate in this visit, we must obtain consent.  I can obtain your verbal consent now.  All virtual visits are billed to your insurance company just like a normal visit would be.  By agreeing to a virtual visit, we'd like you to understand that the technology does not allow for your provider to perform an examination, and thus may limit your provider's ability to fully assess your condition.  Finally, though the technology is pretty good, we cannot assure that it will always work on either your or our end, and in the setting of a video visit, we may have to convert it to a phone-only visit.  In either situation, we cannot ensure that we have a secure connection.  Are you willing to proceed?  YES    TELEPHONE CALL NOTE  Ethan Vega. has been deemed a candidate for a follow-up tele-health visit to limit community exposure during the Covid-19 pandemic. I spoke with the patient via phone to ensure availability of phone/video source, confirm preferred email & phone number, and discuss instructions and expectations.  I reminded Ethan Vega. to be prepared with any vital sign and/or heart rhythm information that could potentially be obtained via home monitoring, at the time of his visit. I reminded Ethan Vega. to expect a phone call at the time of his visit if his visit.  Alba Destine, RMA 07/01/2018 2:43 PM     FULL LENGTH CONSENT FOR TELE-HEALTH VISIT   I hereby voluntarily request, consent and authorize [facility] and its employed or contracted physicians, physician assistants, nurse practitioners or other licensed health care professionals (the Practitioner), to provide me with telemedicine health care services (the  "Services") as deemed necessary by the treating Practitioner. I acknowledge and consent to receive the Services by the Practitioner via telemedicine. I understand that the telemedicine visit will involve communicating with the Practitioner through live audiovisual communication technology and the disclosure of certain medical information by electronic transmission. I acknowledge that I have been given the opportunity to request an in-person assessment or other available alternative prior to the telemedicine visit and am voluntarily participating in the telemedicine visit.  I understand that I have the right to withhold or withdraw my consent to the use of telemedicine in the course of my care at any time, without affecting my right to future care or treatment, and that the Practitioner or I may terminate the telemedicine visit at any time. I understand that I have the right to inspect all information obtained and/or recorded in the course of the telemedicine visit and may receive copies of available information for a reasonable fee.  I understand that some of the potential risks of receiving the Services via telemedicine include:  Marland Kitchen Delay or interruption in medical evaluation due to technological equipment failure or disruption; . Information transmitted may not be sufficient (e.g. poor resolution of images) to allow for appropriate medical decision making by the Practitioner; and/or  . In rare instances, security protocols could fail, causing a breach of personal health information.  Furthermore, I acknowledge that it is my responsibility to provide information about my medical history, conditions and care that is complete and accurate to the best  of my ability. I acknowledge that Practitioner's advice, recommendations, and/or decision may be based on factors not within their control, such as incomplete or inaccurate data provided by me or distortions of diagnostic images or specimens that may result from  electronic transmissions. I understand that the practice of medicine is not an exact science and that Practitioner makes no warranties or guarantees regarding treatment outcomes. I acknowledge that I will receive a copy of this consent concurrently upon execution via email to the email address I last provided but may also request a printed copy by calling the office of [place].    I understand that my insurance will be billed for this visit.   I have read or had this consent read to me. . I understand the contents of this consent, which adequately explains the benefits and risks of the Services being provided via telemedicine.  . I have been provided ample opportunity to ask questions regarding this consent and the Services and have had my questions answered to my satisfaction. . I give my informed consent for the services to be provided through the use of telemedicine in my medical care  By participating in this telemedicine visit I agree to the above.

## 2018-07-06 ENCOUNTER — Encounter: Admission: RE | Payer: Self-pay | Source: Home / Self Care

## 2018-07-06 ENCOUNTER — Ambulatory Visit: Admission: RE | Admit: 2018-07-06 | Payer: Medicare Other | Source: Home / Self Care | Admitting: Gastroenterology

## 2018-07-06 SURGERY — COLONOSCOPY WITH PROPOFOL
Anesthesia: General

## 2018-07-14 ENCOUNTER — Other Ambulatory Visit: Payer: Self-pay

## 2018-07-14 ENCOUNTER — Ambulatory Visit (INDEPENDENT_AMBULATORY_CARE_PROVIDER_SITE_OTHER): Payer: Medicare Other | Admitting: *Deleted

## 2018-07-14 DIAGNOSIS — I472 Ventricular tachycardia, unspecified: Secondary | ICD-10-CM

## 2018-07-14 DIAGNOSIS — I428 Other cardiomyopathies: Secondary | ICD-10-CM

## 2018-07-14 LAB — CUP PACEART REMOTE DEVICE CHECK
Battery Remaining Longevity: 46 mo
Battery Remaining Percentage: 52 %
Battery Voltage: 2.92 V
Brady Statistic AP VP Percent: 3.3 %
Brady Statistic AP VS Percent: 96 %
Brady Statistic AS VP Percent: 1 %
Brady Statistic AS VS Percent: 1 %
Brady Statistic RA Percent Paced: 99 %
Brady Statistic RV Percent Paced: 3.3 %
Date Time Interrogation Session: 20200428080018
HighPow Impedance: 78 Ohm
HighPow Impedance: 78 Ohm
Implantable Lead Implant Date: 20160114
Implantable Lead Implant Date: 20160114
Implantable Lead Location: 753859
Implantable Lead Location: 753860
Implantable Lead Model: 7122
Implantable Pulse Generator Implant Date: 20160114
Lead Channel Impedance Value: 360 Ohm
Lead Channel Impedance Value: 400 Ohm
Lead Channel Pacing Threshold Amplitude: 0.625 V
Lead Channel Pacing Threshold Amplitude: 1.25 V
Lead Channel Pacing Threshold Pulse Width: 0.5 ms
Lead Channel Pacing Threshold Pulse Width: 0.6 ms
Lead Channel Sensing Intrinsic Amplitude: 1.3 mV
Lead Channel Sensing Intrinsic Amplitude: 11 mV
Lead Channel Setting Pacing Amplitude: 1.625
Lead Channel Setting Pacing Amplitude: 2.5 V
Lead Channel Setting Pacing Pulse Width: 0.6 ms
Lead Channel Setting Sensing Sensitivity: 0.5 mV
Pulse Gen Serial Number: 7241695

## 2018-07-21 ENCOUNTER — Telehealth (INDEPENDENT_AMBULATORY_CARE_PROVIDER_SITE_OTHER): Payer: Medicare Other | Admitting: Internal Medicine

## 2018-07-21 ENCOUNTER — Other Ambulatory Visit: Payer: Self-pay

## 2018-07-21 VITALS — BP 112/73 | HR 64 | Ht 66.0 in | Wt 225.0 lb

## 2018-07-21 DIAGNOSIS — I472 Ventricular tachycardia, unspecified: Secondary | ICD-10-CM

## 2018-07-21 DIAGNOSIS — Z79899 Other long term (current) drug therapy: Secondary | ICD-10-CM | POA: Diagnosis not present

## 2018-07-21 NOTE — Patient Instructions (Addendum)
Medication Instructions:  - Your physician recommends that you continue on your current medications as directed. Please refer to the Current Medication list given to you today.  If you need a refill on your cardiac medications before your next appointment, please call your pharmacy.   Lab work: - Your physician recommends that you have lab work: BMET/ Magnesium: Wednesday 07/22/18 when getting your EKG done- [place] at [facility]- 1st desk on the right to check in  If you have labs (blood work) drawn today and your tests are completely normal, you will receive your results only by: Marland Kitchen MyChart Message (if you have MyChart) OR . A paper copy in the mail If you have any lab test that is abnormal or we need to change your treatment, we will call you to review the results.  Testing/Procedures: - Your physician has recommended that you have an EKG done: arrive at 9:45 am on Wednesday 07/22/18- [facility] at [facility]- 1st desk on the right to check in.  Follow-Up: At [facility], you and your health needs are our priority.  As part of our continuing mission to provide you with exceptional heart care, we have created designated Provider Care Teams.  These Care Teams include your primary Cardiologist (physician) and Advanced Practice Providers (APPs -  Physician Assistants and Nurse Practitioners) who all work together to provide you with the care you need, when you need it.  . You will need a follow up appointment in 3 months with Dr. Caryl Comes  . Please call our office 2 months in advance to schedule this appointment (call in late May/ early June to schedule).   .  Remote monitoring is used to monitor your Pacemaker of ICD from home. This monitoring reduces the number of office visits required to check your device to one time per year. It allows [place] to keep an eye on the functioning of your device to ensure it is working properly. You are scheduled for a device check from home on 10/13/2018. You may  send your transmission at any time that day. If you have a wireless device, the transmission will be sent automatically. After your physician reviews your transmission, you will receive a postcard with your next transmission date.   Any Other Special Instructions Will Be Listed Below (If Applicable). - N/A

## 2018-07-21 NOTE — Progress Notes (Signed)
Electrophysiology TeleHealth Note   Due to national recommendations of social distancing due to COVID 19, an audio/video telehealth visit is felt to be most appropriate for this patient at this time.  See MyChart message from today for the patient's consent to telehealth for [facility].   Date:  07/21/2018   ID:  Ethan Hoes., DOB Jan 27, 1946, MRN [id number]  Location: patient's home  Provider location: [address] [place]  Evaluation Performed: Follow-up visit  PCP:  Jodi Marble, MD  Cardiologist:    Electrophysiologist:  SK   Chief Complaint:  Ventricular tachycardia   History of Present Illness:    Ethan Floyd. is a 73 y.o. male who presents via audio/video conferencing for a telehealth visit today.  Since last being seen in our clinic, the patient reports doing well  No shocks  No edema, stable SOB and sheltering in place  Tolerating the addition of sotalol, but struggling with the [facility] horse pills   Trivial bleeding if he bumps his hands  Vision is not improving                                       Date Cr K Hgb TSH  1/17     6.4  8/17  0.82  14.4 4.85  5/18 0.87  13.2 -  12/18 1.43 4.0 14.3 2.36  5/19 0.83 4.0 14.2   3/20 1.09 3.7 13.4 2.89   PCP checked TSH was within range ( 3/19)     DATE TEST    1/16 Cath EF 30-35%  CA normal   4/16  Echo   EF 45-50 %   5/16 myoview   EF %  prior infarct?? 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   Past Medical History:  Diagnosis Date  . AICD (automatic cardioverter/defibrillator) present 03/31/14   a. 03/2014 s/p SJM 2411-36C dual chamber AICD (serial Number [id number])- followed by Dr. Caryl Comes  . Arthritis    a. L knee.  . Asthma   . Cancer ([place])    Melanoma resected from scalp  .  Cardiac arrest [facility])    a. 03/30/2014 VF arrest in setting of NICM >> CPR/defib in community >> s/p dual chamber AICD  . Coronary artery disease, non-occlusive    a. cath 03/2014 at [facility]: no sig CAD (OM1 30%, CFX 30%), EF 35%  . DM2 (diabetes mellitus, type 2) ([place])    a. Diet controlled.  Marland Kitchen HLD (hyperlipidemia)   . HTN (hypertension)   . Kidney stones   . NICM (nonischemic cardiomyopathy) ([place])    a. 03/2014 Echo:  Inferolateral and lateral HK, EF 69-62%, grade 1 diastolic dysfunction, mild MR, mild LAE, mild RAE, trivial TR, no effusion.  . Paroxysmal atrial fibrillation ([place]) 03/28/2015  . Retinal artery occlusion     Past Surgical History:  Procedure Laterality Date  . adenomatous polyps    . CARDIAC CATHETERIZATION  03/30/2014  . CARDIOVERSION  03/30/2014  . COLONOSCOPY WITH PROPOFOL N/A 03/24/2017   Procedure: COLONOSCOPY WITH PROPOFOL;  Surgeon: Lollie Sails, MD;  Location: [facility] ENDOSCOPY;  Service: Endoscopy;  Laterality: N/A;  .  CYSTOSCOPY W/ LITHOLAPAXY / EHL    . IMPLANTABLE CARDIOVERTER DEFIBRILLATOR IMPLANT N/A 03/31/2014   Procedure: IMPLANTABLE CARDIOVERTER DEFIBRILLATOR IMPLANT;  Surgeon: Evans Lance, MD;  Location: [facility] CATH LAB;  Service: Cardiovascular;  Laterality: N/A;  . JOINT REPLACEMENT    . TONSILLECTOMY     "as a kid"  . TOTAL KNEE ARTHROPLASTY Left ~ 2010  . TOTAL KNEE ARTHROPLASTY WITH REVISION COMPONENTS Left ~ 2011  . VARICOSE VEIN SURGERY Left ~ 2014    Current Outpatient Medications  Medication Sig Dispense Refill  . ADVAIR DISKUS 250-50 MCG/DOSE AEPB INHALE 1 PUFF INTO THE LUNGS 2 (TWO) TIMES DAILY. RINSE MOUTH AFTER USE. 60 each 1  . albuterol (PROVENTIL HFA;VENTOLIN HFA) 108 (90 Base) MCG/ACT inhaler Inhale 2 puffs into the lungs every 6 (six) hours as needed for wheezing or shortness of breath. 1 Inhaler 2  . apixaban (ELIQUIS) 5 MG TABS tablet Take 1 tablet (5 mg total) by mouth 2 (two) times daily. 180 tablet 3  . atorvastatin (LIPITOR) 10 MG  tablet Take 1 tablet (10 mg total) by mouth daily. 90 tablet 3  . carvedilol (COREG) 25 MG tablet Take 1 tablet (25 mg total) by mouth 2 (two) times daily with a meal. 180 tablet 3  . furosemide (LASIX) 40 MG tablet Take 1 tablet (40 mg) by mouth once daily 90 tablet 3  . levothyroxine (SYNTHROID, LEVOTHROID) 75 MCG tablet Take 75 mcg by mouth daily.  2  . losartan (COZAAR) 50 MG tablet Take 1 tablet (50 mg total) by mouth daily. 30 tablet 6  . potassium chloride SA (K-DUR,KLOR-CON) 20 MEQ tablet Take 1 tablet (20 mEq total) by mouth daily. 30 tablet 6  . sotalol (BETAPACE) 80 MG tablet Take 1 tablet (80 mg total) by mouth 2 (two) times daily. 60 tablet 6   No current facility-administered medications for this visit.     Allergies:   Patient has no known allergies.   Social History:  The patient  reports that he is a non-smoker but has been exposed to tobacco smoke. He has never used smokeless tobacco. He reports that he does not drink alcohol or use drugs.   Family History:  The patient's   family history includes Esophageal cancer in his mother; Heart attack in his father; Hypertension in his brother.   ROS:  Please see the history of present illness.   All other systems are personally reviewed and negative.    Exam:    Vital Signs:  BP 112/73 (BP Location: Left Arm, Patient Position: Sitting, Cuff Size: Normal)   Pulse 64   Ht 5\' 6"  (1.676 m)   Wt 225 lb (102.1 kg)   BMI 36.32 kg/m       Labs/Other Tests and Data Reviewed:    Recent Labs: 07/30/2017: B Natriuretic Peptide 529.0 08/14/2017: ALT 35 04/15/2018: Hemoglobin 13.4; Platelets 236 04/28/2018: TSH 2.890 05/22/2018: BUN 21; Creatinine, Ser 1.09; Magnesium 2.1; Potassium 3.7; Sodium 143   Wt Readings from Last 3 Encounters:  07/21/18 225 lb (102.1 kg)  05/26/18 240 lb 8 oz (109.1 kg)  04/28/18 241 lb 12 oz (109.7 kg)     Other studies personally reviewed: Additional studies/ records that were reviewed today include   Review of the above records today demonstrates: *ECG       Last device remote is reviewed from [place] PDF dated 07/14/18* which reveals normal device function,   arrhythmias - none  yEAH    ASSESSMENT & PLAN:  Aborted Cardiac arrest//FHx Cardiac Arrest   ICD  St Jude The patient's device was interrogated.  The information was reviewed. No changes were made in the programming.     Hypertension  Sinus bradycardia/chronotropic incompetence  Low Voltage ECG  Atrial Fibrillation   Retinal artery occlusion  Renal insufficiency, volume sensitive  Congestive heart failure- chronic-systolic/diastolic  Visual loss    Vision is stable--  Euvolemic continue current meds  On Anticoagulation;  No bleeding issues      COVID 19 screen The patient denies symptoms of COVID 19 at this time.  The importance of social distancing was discussed today.  Follow-up: 3 months  Next remote: As Scheduled   Current medicines are reviewed at length with the patient today.   The patient does not have concerns regarding his medicines.  The following changes were made today:  none  Labs/ tests ordered today include:   ECG and BMET/Mg No orders of the defined types were placed in this encounter.   Future tests ( post COVID ) Will need echo and pyrophosphate scan previously ordered when available     Patient Risk:  after full review of this patients clinical status, I feel that they are at moderate risk at this time.  Today, I have spent 14  minutes with the patient with telehealth technology discussing the above.  Signed, Virl Axe, MD  07/21/2018 9:50 AM     [facility] [address] [place] [address] [phone] (office) [phone] (fax)

## 2018-07-22 ENCOUNTER — Other Ambulatory Visit: Payer: Self-pay

## 2018-07-22 ENCOUNTER — Ambulatory Visit
Admission: RE | Admit: 2018-07-22 | Discharge: 2018-07-22 | Disposition: A | Discharge status: Home / Self Care | Payer: Medicare Other | Source: Ambulatory Visit | Attending: Internal Medicine | Admitting: Internal Medicine

## 2018-07-22 ENCOUNTER — Other Ambulatory Visit
Admission: RE | Admit: 2018-07-22 | Discharge: 2018-07-22 | Disposition: A | Discharge status: Home / Self Care | Payer: Medicare Other | Source: Home / Self Care | Attending: Internal Medicine | Admitting: Internal Medicine

## 2018-07-22 DIAGNOSIS — I472 Ventricular tachycardia, unspecified: Secondary | ICD-10-CM

## 2018-07-22 DIAGNOSIS — Z79899 Other long term (current) drug therapy: Secondary | ICD-10-CM | POA: Diagnosis not present

## 2018-07-22 LAB — BASIC METABOLIC PANEL
Anion gap: 9 (ref 5–15)
BUN: 23 mg/dL (ref 8–23)
CO2: 29 mmol/L (ref 22–32)
Calcium: 9.4 mg/dL (ref 8.9–10.3)
Chloride: 102 mmol/L (ref 98–111)
Creatinine, Ser: 1.1 mg/dL (ref 0.61–1.24)
GFR calc Af Amer: >60 mL/min (ref >60)
GFR calc non Af Amer: >60 mL/min (ref >60)
Glucose, Bld: 106 mg/dL — ABNORMAL HIGH (ref 70–99)
Potassium: 4.3 mmol/L (ref 3.5–5.1)
Sodium: 140 mmol/L (ref 135–145)

## 2018-07-22 LAB — MAGNESIUM: Magnesium: 2.4 mg/dL (ref 1.7–2.4)

## 2018-07-23 NOTE — Progress Notes (Signed)
Remote ICD transmission.   

## 2018-07-24 ENCOUNTER — Telehealth: Payer: Self-pay | Admitting: Internal Medicine

## 2018-07-24 NOTE — Telephone Encounter (Signed)
Dr. Caryl Comes,  The patient had his BMP/ Magnesium drawn and his EKG done at [facility] on 07/22/18 per his e-visit with you.  Please review!  Thank you!

## 2018-07-29 NOTE — Telephone Encounter (Signed)
Notes recorded by Deboraha Sprang, MD on 07/29/2018 at 9:14 AM EDT Please Inform Patient that labs are normal  Thanks

## 2018-07-29 NOTE — Telephone Encounter (Signed)
The patient has been notified of his results.

## 2018-08-13 ENCOUNTER — Telehealth: Payer: Self-pay | Admitting: Internal Medicine

## 2018-08-13 NOTE — Telephone Encounter (Signed)
Patient wanted to let us know he is taking Klor-Con. Please let patient know if he is supposed to take Losartan. Please call to discuss.

## 2018-08-13 NOTE — Telephone Encounter (Signed)
Attempted to call the patient. No answer- I left a message on his identified voice mail that he is ok to take potassium and losartan together.  I advised we keep a close eye on his blood work and his last potassium levels were ok.   I asked that he call back with any further questions or concerns.

## 2018-09-14 ENCOUNTER — Encounter (HOSPITAL_COMMUNITY): Payer: Self-pay | Admitting: Internal Medicine

## 2018-09-16 ENCOUNTER — Telehealth (HOSPITAL_COMMUNITY): Payer: Self-pay | Admitting: *Deleted

## 2018-09-16 NOTE — Telephone Encounter (Signed)
Patient given instructions for amyloid study on 09/21/18 @ 1:00.  Verbalized understanding.

## 2018-09-21 ENCOUNTER — Encounter (INDEPENDENT_AMBULATORY_CARE_PROVIDER_SITE_OTHER): Payer: Self-pay

## 2018-09-21 ENCOUNTER — Other Ambulatory Visit: Payer: Self-pay

## 2018-09-21 ENCOUNTER — Ambulatory Visit (HOSPITAL_COMMUNITY): Payer: Medicare Other | Attending: Internal Medicine

## 2018-09-21 DIAGNOSIS — R9431 Abnormal electrocardiogram [ECG] [EKG]: Secondary | ICD-10-CM | POA: Diagnosis not present

## 2018-09-21 MED ORDER — TECHNETIUM TC 99M PYROPHOSPHATE
23.3000 | Freq: Once | INTRAVENOUS | Status: AC
  Administered 2018-09-21: 23.3 via INTRAVENOUS

## 2018-09-21 MED ORDER — TECHNETIUM TC 99M TETROFOSMIN IV KIT
23.3000 | PACK | Freq: Once | INTRAVENOUS | Status: AC | PRN
  Filled 2018-09-21: qty 24

## 2018-09-29 ENCOUNTER — Telehealth: Payer: Self-pay

## 2018-09-29 NOTE — Telephone Encounter (Signed)

## 2018-09-30 ENCOUNTER — Other Ambulatory Visit: Payer: Self-pay

## 2018-09-30 ENCOUNTER — Ambulatory Visit (INDEPENDENT_AMBULATORY_CARE_PROVIDER_SITE_OTHER): Payer: Medicare Other

## 2018-09-30 DIAGNOSIS — I472 Ventricular tachycardia, unspecified: Secondary | ICD-10-CM

## 2018-09-30 MED ORDER — PERFLUTREN LIPID MICROSPHERE
1.0000 mL | INTRAVENOUS | Status: AC | PRN
  Administered 2018-09-30: 2 mL via INTRAVENOUS

## 2018-10-13 ENCOUNTER — Ambulatory Visit (INDEPENDENT_AMBULATORY_CARE_PROVIDER_SITE_OTHER): Payer: Medicare Other | Admitting: *Deleted

## 2018-10-13 DIAGNOSIS — I428 Other cardiomyopathies: Secondary | ICD-10-CM

## 2018-10-13 LAB — CUP PACEART REMOTE DEVICE CHECK
Battery Remaining Longevity: 44 mo
Battery Remaining Percentage: 50 %
Battery Voltage: 2.92 V
Brady Statistic AP VP Percent: 2.6 %
Brady Statistic AP VS Percent: 97 %
Brady Statistic AS VP Percent: 1 %
Brady Statistic AS VS Percent: 1 %
Brady Statistic RA Percent Paced: 99 %
Brady Statistic RV Percent Paced: 2.6 %
Date Time Interrogation Session: 20200728080016
HighPow Impedance: 71 Ohm
HighPow Impedance: 71 Ohm
Implantable Lead Implant Date: 20160114
Implantable Lead Implant Date: 20160114
Implantable Lead Location: 753859
Implantable Lead Location: 753860
Implantable Lead Model: 7122
Implantable Pulse Generator Implant Date: 20160114
Lead Channel Impedance Value: 350 Ohm
Lead Channel Impedance Value: 380 Ohm
Lead Channel Pacing Threshold Amplitude: 0.625 V
Lead Channel Pacing Threshold Amplitude: 1.25 V
Lead Channel Pacing Threshold Pulse Width: 0.5 ms
Lead Channel Pacing Threshold Pulse Width: 0.6 ms
Lead Channel Sensing Intrinsic Amplitude: 10.6 mV
Lead Channel Sensing Intrinsic Amplitude: 2.9 mV
Lead Channel Setting Pacing Amplitude: 1.625
Lead Channel Setting Pacing Amplitude: 2.5 V
Lead Channel Setting Pacing Pulse Width: 0.6 ms
Lead Channel Setting Sensing Sensitivity: 0.5 mV
Pulse Gen Serial Number: 7241695

## 2018-10-26 ENCOUNTER — Encounter: Payer: Self-pay | Admitting: Cardiology

## 2018-10-26 NOTE — Progress Notes (Signed)
Remote ICD transmission.   

## 2018-11-12 ENCOUNTER — Encounter: Payer: Self-pay | Admitting: Internal Medicine

## 2018-11-12 ENCOUNTER — Ambulatory Visit (INDEPENDENT_AMBULATORY_CARE_PROVIDER_SITE_OTHER): Payer: Medicare Other | Admitting: Internal Medicine

## 2018-11-12 ENCOUNTER — Other Ambulatory Visit: Payer: Self-pay

## 2018-11-12 DIAGNOSIS — I5042 Chronic combined systolic (congestive) and diastolic (congestive) heart failure: Secondary | ICD-10-CM | POA: Diagnosis not present

## 2018-11-12 DIAGNOSIS — Z79899 Other long term (current) drug therapy: Secondary | ICD-10-CM

## 2018-11-12 DIAGNOSIS — I480 Paroxysmal atrial fibrillation: Secondary | ICD-10-CM

## 2018-11-12 DIAGNOSIS — I428 Other cardiomyopathies: Secondary | ICD-10-CM | POA: Diagnosis not present

## 2018-11-12 DIAGNOSIS — Z95810 Presence of automatic (implantable) cardiac defibrillator: Secondary | ICD-10-CM

## 2018-11-12 DIAGNOSIS — I472 Ventricular tachycardia, unspecified: Secondary | ICD-10-CM

## 2018-11-12 NOTE — Progress Notes (Signed)
Patient Care Team: Jodi Marble, MD as PCP - General (Internal Medicine) Deboraha Sprang, MD as PCP - Cardiology (Cardiology)   HPI  Ethan Vega. is a 73 y.o. male Seen following ICD implantation 1/16 following aborted cardiac arrest occurring in the context of nonischemic cardiomyopathy with an abnormal thallium scan suggesting sarcoid,    CT-high resolution was neg for interstitial/LN   There were small nodules  Flecainide challenge was negative ECG  Low Volts R-V1 with RAD with mild RA enlargement SAECG notdone as he was in AFib RVR and rate related RBBB  He has a history of atrial fibrillation.  He was on apixaban and amiodarone.  He has a history of syncopal ventricular tachycardia of 12/18 prompting up titration of his amiodarone.  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    Recurrent VT--multiple episodes since stopping amio, many of which have not been terminated by ATP,, but therapies have been stopped as VT has fallen again below detection  Sotalol was started   No arrhthymias of which he is aware  Significant edema dyspnea and orthopnea.  Recent fall because he thinks his foot got caught.  Did not lose consciousness.  Hurt his ribs.  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 No ischemia  2/18 ECHO  40-45 %   7/20 Echo  40-45%       Past Medical History:  Diagnosis Date  . AICD (automatic cardioverter/defibrillator) present 03/31/14   a. 03/2014 s/p SJM 2411-36C dual chamber AICD (serial Number [id number])- followed by Dr. Caryl Comes  .  Arthritis    a. L knee.  . Asthma   . Cancer ([place])    Melanoma resected from scalp  . Cardiac arrest [facility])    a. 03/30/2014 VF arrest in setting of NICM >> CPR/defib in community >> s/p dual chamber AICD  . Coronary artery disease, non-occlusive    a. cath 03/2014 at [facility]: no sig CAD (OM1 30%, CFX 30%), EF 35%  . DM2 (diabetes mellitus, type 2) ([place])    a. Diet controlled.  Marland Kitchen HLD (hyperlipidemia)   . HTN (hypertension)   . Kidney stones   . NICM (nonischemic cardiomyopathy) ([place])    a. 03/2014 Echo:  Inferolateral and lateral HK, EF 99991111, grade 1 diastolic dysfunction, mild MR, mild LAE, mild RAE, trivial TR, no effusion.  . Paroxysmal atrial fibrillation ([place]) 03/28/2015  . Retinal artery occlusion     Past Surgical History:  Procedure Laterality Date  . adenomatous polyps    . CARDIAC CATHETERIZATION  03/30/2014  . CARDIOVERSION  03/30/2014  . COLONOSCOPY WITH PROPOFOL N/A 03/24/2017   Procedure: COLONOSCOPY WITH PROPOFOL;  Surgeon: Lollie Sails, MD;  Location: [facility] ENDOSCOPY;  Service:  Endoscopy;  Laterality: N/A;  . CYSTOSCOPY W/ LITHOLAPAXY / EHL    . IMPLANTABLE CARDIOVERTER DEFIBRILLATOR IMPLANT N/A 03/31/2014   Procedure: IMPLANTABLE CARDIOVERTER DEFIBRILLATOR IMPLANT;  Surgeon: Evans Lance, MD;  Location: [facility] CATH LAB;  Service: Cardiovascular;  Laterality: N/A;  . JOINT REPLACEMENT    . TONSILLECTOMY     "as a kid"  . TOTAL KNEE ARTHROPLASTY Left ~ 2010  . TOTAL KNEE ARTHROPLASTY WITH REVISION COMPONENTS Left ~ 2011  . VARICOSE VEIN SURGERY Left ~ 2014    Current Outpatient Medications  Medication Sig Dispense Refill  . ADVAIR DISKUS 250-50 MCG/DOSE AEPB INHALE 1 PUFF INTO THE LUNGS 2 (TWO) TIMES DAILY. RINSE MOUTH AFTER USE. 60 each 1  . albuterol (PROVENTIL HFA;VENTOLIN HFA) 108 (90 Base) MCG/ACT inhaler Inhale 2 puffs into the lungs every 6 (six) hours as needed for wheezing or shortness of breath. 1 Inhaler 2  . apixaban (ELIQUIS) 5 MG TABS tablet Take 1  tablet (5 mg total) by mouth 2 (two) times daily. 180 tablet 3  . atorvastatin (LIPITOR) 10 MG tablet Take 1 tablet (10 mg total) by mouth daily. 90 tablet 3  . carvedilol (COREG) 25 MG tablet Take 1 tablet (25 mg total) by mouth 2 (two) times daily with a meal. 180 tablet 3  . furosemide (LASIX) 40 MG tablet Take 1 tablet (40 mg) by mouth once daily 90 tablet 3  . levothyroxine (SYNTHROID, LEVOTHROID) 75 MCG tablet Take 75 mcg by mouth daily.  2  . losartan (COZAAR) 50 MG tablet Take 1 tablet (50 mg total) by mouth daily. 30 tablet 6  . potassium chloride SA (K-DUR,KLOR-CON) 20 MEQ tablet Take 1 tablet (20 mEq total) by mouth daily. 30 tablet 6   No current facility-administered medications for this visit.    Facility-Administered Medications Ordered in Other Visits  Medication Dose Route Frequency Provider Last Rate Last Dose  . technetium tetrofosmin (TC-MYOVIEW) injection 123XX123 millicurie  123XX123 millicurie Intravenous Once PRN Fay Records, MD        No Known Allergies  Review of Systems negative except from HPI and PMH  Physical Exam  There were no vitals taken for this visit.  Well developed and well nourished in no acute distress HENT normal Neck supple with JVP-8-10 Clear Device pocket well healed; without hematoma or erythema.  There is no tethering  Regular rate and rhythm, no gallop  2/6 murmur Abd-soft with active BS No Clubbing cyanosis 3+ edema Skin-warm and dry A & Oriented  Grossly normal sensory and motor function  ECG Atrial pacin  64  Intervals 25/10/45   Device interrogation 10/13/2018 and today reveals no interval atrial fibrillation or ventricular tachycardia  Assessment and  Plan  Aborted Cardiac arrest//FHx Cardiac Arrest   ICD  St Jude The patient's device was interrogated.  The information was reviewed. No changes were made in the programming.     Hypertension  High Risk Medication Surveillance-sotalol  Sinus bradycardia/chronotropic  incompetence  Low Voltage ECG  Atrial Fibrillation   Retinal artery occlusion  Renal insufficiency, volume sensitive  Congestive heart failure- acute/ chronic-systolic/diastolic  Visual loss      No interval ventricular tachycardia.  We will continue his sotalol.  Surveillance laboratories were normal. We will hold off on adjunctive medications  No interval atrial fibrillation.  Significantly volume overloaded.  We will increase his diuretics from 40 daily--80 twice daily x3 days and 80 daily for 1 week.  We will increase his potassium concomitantly from  20--40 mg daily x5 days  We spent more than 50% of our >25 min visit in face to face counseling regarding the above

## 2018-11-12 NOTE — Patient Instructions (Signed)
Medication Instructions:  - Your physician has recommended you make the following change in your medication:   1) Increase lasix (furosemide) 40 mg - take 2 tablets (80 mg) by mouth twice daily x 3 days, then - take 2 tablets (80 mg) by mouth once daily x 7 days, then - resume 1 tablet (40 mg) by mouth once daily  2) Increase potasstium 20 meq - take 1 tablet (20 meq) by mouth twice daily x 4 days, then - resume 1 tablet (20 meq) once daily   If you need a refill on your cardiac medications before your next appointment, please call your pharmacy.   Lab work: - none ordered  If you have labs (blood work) drawn today and your tests are completely normal, you will receive your results only by: Marland Kitchen MyChart Message (if you have MyChart) OR . A paper copy in the mail If you have any lab test that is abnormal or we need to change your treatment, we will call you to review the results.  Testing/Procedures: - none ordered  Follow-Up: At [facility], you and your health needs are our priority.  As part of our continuing mission to provide you with exceptional heart care, we have created designated Provider Care Teams.  These Care Teams include your primary Cardiologist (physician) and Advanced Practice Providers (APPs -  Physician Assistants and Nurse Practitioners) who all work together to provide you with the care you need, when you need it.  You will need a follow up appointment in 6 months (February 2021) with Dr. Caryl Comes.  Marland Kitchen Please call our office 2 months in advance to schedule this appointment. (Call in early December to schedule).  Remote monitoring is used to monitor your Pacemaker of ICD from home. This monitoring reduces the number of office visits required to check your device to one time per year. It allows [place] to keep an eye on the functioning of your device to ensure it is working properly. You are scheduled for a device check from home on 01/12/19. You may send your transmission at  any time that day. If you have a wireless device, the transmission will be sent automatically. After your physician reviews your transmission, you will receive a postcard with your next transmission date.   Any Other Special Instructions Will Be Listed Below (If Applicable). - N/A

## 2018-12-14 ENCOUNTER — Telehealth: Payer: Self-pay | Admitting: Internal Medicine

## 2018-12-14 DIAGNOSIS — I428 Other cardiomyopathies: Secondary | ICD-10-CM

## 2018-12-14 DIAGNOSIS — I5042 Chronic combined systolic (congestive) and diastolic (congestive) heart failure: Secondary | ICD-10-CM

## 2018-12-14 NOTE — Telephone Encounter (Signed)
Pt c/o swelling: STAT is pt has developed SOB within 24 hours  1) How much weight have you gained and in what time span? Have not weighed  2) If swelling, where is the swelling located? L hand on top and wrist, L leg, ankle and foot. Right side seems fine   3) Are you currently taking a fluid pill? Yes,40 mg Lasix  4) Are you currently SOB? On inhaler but does get short of breath quickly upon exertion   5) Do you have a log of your daily weights (if so, list)? n/a  6) Have you gained 3 pounds in a day or 5 pounds in a week? n/a  7) Have you traveled recently? no

## 2018-12-14 NOTE — Telephone Encounter (Signed)
Call to patient to discuss extremity swelling that worsened starting last Wednesday. He reports that it is primarily in L arm, L leg and L knee.   He has continued taking lasix as ordered and feels that swelling is not improving.   He does not take daily weights but after breakfast today with no clothes, weight was 245 lbs. Last weight in chart is 5/5 225lbs.   Has SOBOE at baseline, no worsening sx. Sleeping without difficulty. Denies chest pain or numbness. No cough or recent illness.   Routing to [place] to further advise.

## 2018-12-16 NOTE — Telephone Encounter (Signed)
Patient states he has not heard from Dr. Caryl Comes, and states he thinks he should increase his fluid pills. Please call and advise

## 2018-12-17 MED ORDER — FUROSEMIDE 40 MG PO TABS: ORAL_TABLET | ORAL | Status: DC

## 2018-12-17 MED ORDER — POTASSIUM CHLORIDE CRYS ER 20 MEQ PO TBCR: EXTENDED_RELEASE_TABLET | ORAL | Status: DC

## 2018-12-17 NOTE — Telephone Encounter (Signed)
Late Entry:  I spoke with the patient yesterday evening.  He advised that he felt like his swelling was under good control after Dr. Caryl Comes upped his lasix for a few days after his last OV on 11/12/18. In the last few weeks, though, he has been having increased swelling to his left foot/ ankle (maybe up to the knee), his left arm/ hand.   He has not been weighing every day, but his weight on 9/28 was 245 lbs On 9/30- 243 lbs  The patient confirms he has been taking lasix 40 mg once daily, with an additional 20 mg in the PM for ~ 2 days.  He cannot tell much difference in his swelling with the extra 20 mg on board.  His breathing is stable. I advised the patient he may require a higher maintenance dose of lasix at this point, but I would need to review with Dr. Caryl Comes and call him back today. I did advised him to take lasix 80 mg this morning.  I reviewed the above with Dr. Caryl Comes this morning.  Per Dr. Caryl Comes: 1) Increase lasix to 80 mg once daily 2) Increase potasium to 40 meq once daily 3) BMP in 2 weeks  Also, per Dr. Caryl Comes, if the patient feels that the lasix 80 mg daily is too strong, he can decrease lasix to 80 mg every other day alternating with lasix 40 mg every other day. If he lowers the dose on his lasix, he would need to adjust his potassium as well to 40 meq on the days he take lasix 80 mg and potassium 20 meq on the days her takes lasix 40 mg.  I have called the patient back and notified him of MD recommendations. He voices understanding of the above and is agreeable. He will go ~ 10/15 or 10/16 for lab work to be done at [facility].

## 2018-12-29 ENCOUNTER — Other Ambulatory Visit: Payer: Self-pay | Admitting: Internal Medicine

## 2018-12-31 ENCOUNTER — Other Ambulatory Visit
Admission: RE | Admit: 2018-12-31 | Discharge: 2018-12-31 | Disposition: A | Discharge status: Home / Self Care | Payer: Medicare Other | Source: Ambulatory Visit | Attending: Internal Medicine | Admitting: Internal Medicine

## 2018-12-31 DIAGNOSIS — I428 Other cardiomyopathies: Secondary | ICD-10-CM

## 2018-12-31 DIAGNOSIS — I5042 Chronic combined systolic (congestive) and diastolic (congestive) heart failure: Secondary | ICD-10-CM | POA: Diagnosis present

## 2018-12-31 LAB — BASIC METABOLIC PANEL
Anion gap: 11 (ref 5–15)
BUN: 13 mg/dL (ref 8–23)
CO2: 29 mmol/L (ref 22–32)
Calcium: 9.3 mg/dL (ref 8.9–10.3)
Chloride: 101 mmol/L (ref 98–111)
Creatinine, Ser: 0.94 mg/dL (ref 0.61–1.24)
GFR calc Af Amer: >60 mL/min (ref >60)
GFR calc non Af Amer: >60 mL/min (ref >60)
Glucose, Bld: 102 mg/dL — ABNORMAL HIGH (ref 70–99)
Potassium: 3.9 mmol/L (ref 3.5–5.1)
Sodium: 141 mmol/L (ref 135–145)

## 2019-01-04 ENCOUNTER — Other Ambulatory Visit
Admission: RE | Admit: 2019-01-04 | Discharge: 2019-01-04 | Disposition: A | Discharge status: Home / Self Care | Payer: Medicare Other | Source: Ambulatory Visit | Attending: Internal Medicine | Admitting: Internal Medicine

## 2019-01-04 ENCOUNTER — Other Ambulatory Visit: Payer: Self-pay

## 2019-01-04 DIAGNOSIS — Z20828 Contact with and (suspected) exposure to other viral communicable diseases: Secondary | ICD-10-CM | POA: Diagnosis not present

## 2019-01-04 DIAGNOSIS — Z01812 Encounter for preprocedural laboratory examination: Secondary | ICD-10-CM | POA: Insufficient documentation

## 2019-01-04 LAB — SARS CORONAVIRUS 2 (TAT 6-24 HRS): SARS Coronavirus 2: NEGATIVE

## 2019-01-06 ENCOUNTER — Encounter: Payer: Self-pay | Admitting: *Deleted

## 2019-01-07 ENCOUNTER — Ambulatory Visit: Payer: Medicare Other | Admitting: Anesthesiology

## 2019-01-07 ENCOUNTER — Other Ambulatory Visit: Payer: Self-pay

## 2019-01-07 ENCOUNTER — Encounter
Admission: RE | Disposition: A | Discharge status: Home / Self Care | Payer: Self-pay | Source: Home / Self Care | Attending: Internal Medicine

## 2019-01-07 ENCOUNTER — Encounter: Payer: Self-pay | Admitting: *Deleted

## 2019-01-07 ENCOUNTER — Ambulatory Visit
Admission: RE | Admit: 2019-01-07 | Discharge: 2019-01-07 | Disposition: A | Discharge status: Home / Self Care | Payer: Medicare Other | Attending: Internal Medicine | Admitting: Internal Medicine

## 2019-01-07 DIAGNOSIS — Z79899 Other long term (current) drug therapy: Secondary | ICD-10-CM | POA: Diagnosis not present

## 2019-01-07 DIAGNOSIS — Z8674 Personal history of sudden cardiac arrest: Secondary | ICD-10-CM | POA: Insufficient documentation

## 2019-01-07 DIAGNOSIS — J45909 Unspecified asthma, uncomplicated: Secondary | ICD-10-CM | POA: Diagnosis not present

## 2019-01-07 DIAGNOSIS — D123 Benign neoplasm of transverse colon: Secondary | ICD-10-CM | POA: Diagnosis not present

## 2019-01-07 DIAGNOSIS — Z95810 Presence of automatic (implantable) cardiac defibrillator: Secondary | ICD-10-CM | POA: Insufficient documentation

## 2019-01-07 DIAGNOSIS — Z85820 Personal history of malignant melanoma of skin: Secondary | ICD-10-CM | POA: Insufficient documentation

## 2019-01-07 DIAGNOSIS — I10 Essential (primary) hypertension: Secondary | ICD-10-CM | POA: Diagnosis not present

## 2019-01-07 DIAGNOSIS — Z7901 Long term (current) use of anticoagulants: Secondary | ICD-10-CM | POA: Diagnosis not present

## 2019-01-07 DIAGNOSIS — E1151 Type 2 diabetes mellitus with diabetic peripheral angiopathy without gangrene: Secondary | ICD-10-CM | POA: Insufficient documentation

## 2019-01-07 DIAGNOSIS — E785 Hyperlipidemia, unspecified: Secondary | ICD-10-CM | POA: Diagnosis not present

## 2019-01-07 DIAGNOSIS — I480 Paroxysmal atrial fibrillation: Secondary | ICD-10-CM | POA: Diagnosis not present

## 2019-01-07 DIAGNOSIS — Z7951 Long term (current) use of inhaled steroids: Secondary | ICD-10-CM | POA: Diagnosis not present

## 2019-01-07 DIAGNOSIS — I2510 Atherosclerotic heart disease of native coronary artery without angina pectoris: Secondary | ICD-10-CM | POA: Diagnosis not present

## 2019-01-07 DIAGNOSIS — R195 Other fecal abnormalities: Secondary | ICD-10-CM | POA: Diagnosis present

## 2019-01-07 HISTORY — PX: COLONOSCOPY WITH PROPOFOL: SHX5780

## 2019-01-07 HISTORY — DX: Malignant melanoma of skin, unspecified: C43.9

## 2019-01-07 HISTORY — DX: Unspecified atrial fibrillation: I48.91

## 2019-01-07 SURGERY — COLONOSCOPY WITH PROPOFOL
Anesthesia: General

## 2019-01-07 MED ORDER — LIDOCAINE HCL (CARDIAC) PF 100 MG/5ML IV SOSY
PREFILLED_SYRINGE | INTRAVENOUS | Status: DC | PRN
  Administered 2019-01-07: 100 mg via INTRAVENOUS

## 2019-01-07 MED ORDER — PROPOFOL 10 MG/ML IV BOLUS
INTRAVENOUS | Status: AC
  Filled 2019-01-07: qty 20

## 2019-01-07 MED ORDER — PROPOFOL 10 MG/ML IV BOLUS
INTRAVENOUS | Status: DC | PRN
  Administered 2019-01-07: 90 mg via INTRAVENOUS

## 2019-01-07 MED ORDER — PROPOFOL 500 MG/50ML IV EMUL
INTRAVENOUS | Status: DC | PRN
  Administered 2019-01-07: 125 ug/kg/min via INTRAVENOUS

## 2019-01-07 MED ORDER — LIDOCAINE HCL (PF) 2 % IJ SOLN
INTRAMUSCULAR | Status: AC
  Filled 2019-01-07: qty 10

## 2019-01-07 MED ORDER — SODIUM CHLORIDE 0.9 % IV SOLN
INTRAVENOUS | Status: DC
  Administered 2019-01-07: 10:00:00 via INTRAVENOUS

## 2019-01-07 NOTE — Interval H&P Note (Signed)
History and Physical Interval Note:  01/07/2019 10:18 AM  Ethan Vega.  has presented today for surgery, with the diagnosis of PERSONAL HX.OF COLON POLYPS.  The various methods of treatment have been discussed with the patient and family. After consideration of risks, benefits and other options for treatment, the patient has consented to  Procedure(s): COLONOSCOPY WITH PROPOFOL (N/A) as a surgical intervention.  The patient's history has been reviewed, patient examined, no change in status, stable for surgery.  I have reviewed the patient's chart and labs.  Questions were answered to the patient's satisfaction.     [place], [place]

## 2019-01-07 NOTE — Anesthesia Post-op Follow-up Note (Signed)
Anesthesia QCDR form completed.        

## 2019-01-07 NOTE — H&P (Signed)
Outpatient short stay form Pre-procedure 01/07/2019 10:13 AM Ethan Vega, M.D.  Primary Physician: [name], M.D.  Reason for visit:  Heme positive stool  History of present illness:  Patient is a pleasant 73 yr old presenting for hemoccult positive stool. Patient denies change in bowel habits, rectal bleeding, weight loss or abdominal pain.      Current Facility-Administered Medications:  .  0.9 %  sodium chloride infusion, , Intravenous, Continuous, [place], [name], MD, Last Rate: 20 mL/hr at 01/07/19 1005  Facility-Administered Medications Ordered in Other Encounters:  .  technetium tetrofosmin (TC-MYOVIEW) injection 123XX123 millicurie, 123XX123 millicurie, Intravenous, Once PRN, Fay Records, MD  Medications Prior to Admission  Medication Sig Dispense Refill Last Dose  . ADVAIR DISKUS 250-50 MCG/DOSE AEPB INHALE 1 PUFF INTO THE LUNGS 2 (TWO) TIMES DAILY. RINSE MOUTH AFTER USE. 60 each 1 01/07/2019 at Unknown time  . carvedilol (COREG) 25 MG tablet Take 1 tablet (25 mg total) by mouth 2 (two) times daily with a meal. 180 tablet 3 01/07/2019 at Unknown time  . levothyroxine (SYNTHROID, LEVOTHROID) 75 MCG tablet Take 75 mcg by mouth daily.  2 01/06/2019 at Unknown time  . albuterol (PROVENTIL HFA;VENTOLIN HFA) 108 (90 Base) MCG/ACT inhaler Inhale 2 puffs into the lungs every 6 (six) hours as needed for wheezing or shortness of breath. (Patient not taking: Reported on 01/07/2019) 1 Inhaler 2 Not Taking at Unknown time  . amiodarone (PACERONE) 200 MG tablet Take 200 mg by mouth daily.   Not Taking at Unknown time  . apixaban (ELIQUIS) 5 MG TABS tablet Take 1 tablet (5 mg total) by mouth 2 (two) times daily. 180 tablet 3 01/05/2019  . atorvastatin (LIPITOR) 10 MG tablet Take 1 tablet (10 mg total) by mouth daily. 90 tablet 3 01/05/2019  . furosemide (LASIX) 40 MG tablet Take 2 tablet (80 mg) by mouth once daily   01/05/2019  . losartan (COZAAR) 50 MG tablet Take 1 tablet (50 mg  total) by mouth daily. 30 tablet 6   . Omega 3-6-9 Fatty Acids (OMEGA 3-6-9 PO) Take 360-1,200 mg by mouth daily.   Not Taking at Unknown time  . potassium chloride SA (KLOR-CON M20) 20 MEQ tablet TAKE 1 TABLET BY MOUTH EVERY DAY (Patient not taking: Reported on 01/07/2019) 30 tablet 3 Not Taking at Unknown time     No Known Allergies   Past Medical History:  Diagnosis Date  . AICD (automatic cardioverter/defibrillator) present 03/31/14   a. 03/2014 s/p SJM 2411-36C dual chamber AICD (serial Number [id number])- followed by Dr. Caryl Comes  . AICD (automatic cardioverter/defibrillator) present   . Arthritis    a. L knee.  . Asthma   . Atrial fibrillation ([place])   . Cancer ([place])    Melanoma resected from scalp  . Cardiac arrest [facility])    a. 03/30/2014 VF arrest in setting of NICM >> CPR/defib in community >> s/p dual chamber AICD  . Cardiac arrest ([place])   . Coronary artery disease, non-occlusive    a. cath 03/2014 at [facility]: no sig CAD (OM1 30%, CFX 30%), EF 35%  . HLD (hyperlipidemia)   . HTN (hypertension)   . Kidney stones   . Melanoma ([place])   . NICM (nonischemic cardiomyopathy) ([place])    a. 03/2014 Echo:  Inferolateral and lateral HK, EF 99991111, grade 1 diastolic dysfunction, mild MR, mild LAE, mild RAE, trivial TR, no effusion.  . Paroxysmal atrial fibrillation ([place]) 03/28/2015  . Retinal artery occlusion  Review of systems:  Otherwise negative.    Physical Exam  Gen: Alert, oriented. Appears stated age.  HEENT: [place]/AT. PERRLA. Lungs: CTA, no wheezes. CV: RR nl S1, S2. Abd: soft, benign, no masses. BS+ Ext: No edema. Pulses 2+    Planned procedures: Proceed with colonoscopy. The patient understands the nature of the planned procedure, indications, risks, alternatives and potential complications including but not limited to bleeding, infection, perforation, damage to internal organs and possible oversedation/side effects from anesthesia. The patient agrees and gives consent to  proceed.  Please refer to procedure notes for findings, recommendations and patient disposition/instructions.     Ethan Vega, M.D. Gastroenterology 01/07/2019  10:13 AM

## 2019-01-07 NOTE — Anesthesia Preprocedure Evaluation (Signed)
Anesthesia Evaluation  Patient identified by MRN, date of birth, ID band Patient awake    Reviewed: Allergy & Precautions, H&P , NPO status , Patient's Chart, lab work & pertinent test results, reviewed documented beta blocker date and time   History of Anesthesia Complications Negative for: history of anesthetic complications  Airway Mallampati: II  TM Distance: >3 FB Neck ROM: full    Dental  (+) Dental Advidsory Given, Partial Upper   Pulmonary neg shortness of breath, asthma , neg sleep apnea, neg COPD, neg recent URI,           Cardiovascular Exercise Tolerance: Good hypertension, (-) angina+ CAD and + Peripheral Vascular Disease  (-) Past MI, (-) Cardiac Stents and (-) CABG (-) dysrhythmias (-) pacemaker+ Cardiac Defibrillator (-) Valvular Problems/Murmurs  NICM   Neuro/Psych negative neurological ROS  negative psych ROS   GI/Hepatic negative GI ROS, Neg liver ROS,   Endo/Other  diabetes, Well ControlledHypothyroidism   Renal/GU Renal disease  negative genitourinary   Musculoskeletal   Abdominal   Peds  Hematology negative hematology ROS (+)   Anesthesia Other Findings Past Medical History: 03/31/14: AICD (automatic cardioverter/defibrillator) present     Comment:  a. 03/2014 s/p SJM 2411-36C dual chamber AICD (serial               Number [id number])- followed by Dr. Caryl Comes No date: Arthritis     Comment:  a. L knee. No date: Asthma No date: Cancer [facility])     Comment:  Melanoma resected from scalp No date: Cardiac arrest [facility])     Comment:  a. 03/30/2014 VF arrest in setting of NICM >> CPR/defib               in community >> s/p dual chamber AICD No date: Coronary artery disease, non-occlusive     Comment:  a. cath 03/2014 at [facility]: no sig CAD (OM1 30%, CFX 30%),               EF 35% No date: DM2 (diabetes mellitus, type 2) ([place])     Comment:  a. Diet controlled. No date: HLD (hyperlipidemia) No date: HTN  (hypertension) No date: Kidney stones No date: NICM (nonischemic cardiomyopathy) ([place])     Comment:  a. 03/2014 Echo:  Inferolateral and lateral HK, EF               99991111, grade 1 diastolic dysfunction, mild MR, mild LAE,              mild RAE, trivial TR, no effusion. 03/28/2015: Paroxysmal atrial fibrillation ([facility]) No date: Retinal artery occlusion   Reproductive/Obstetrics negative OB ROS                             Anesthesia Physical  Anesthesia Plan  ASA: III  Anesthesia Plan: General   Post-op Pain Management:    Induction: Intravenous  PONV Risk Score and Plan: 2 and Propofol infusion  Airway Management Planned: Nasal Cannula and Natural Airway  Additional Equipment:   Intra-op Plan:   Post-operative Plan:   Informed Consent: I have reviewed the patients History and Physical, chart, labs and discussed the procedure including the risks, benefits and alternatives for the proposed anesthesia with the patient or authorized representative who has indicated his/her understanding and acceptance.     Dental Advisory Given  Plan Discussed with: Anesthesiologist, CRNA and Surgeon  Anesthesia Plan Comments:  Anesthesia Quick Evaluation  

## 2019-01-07 NOTE — Transfer of Care (Signed)
Immediate Anesthesia Transfer of Care Note  Patient: Ethan Vega.  Procedure(s) Performed: COLONOSCOPY WITH PROPOFOL (N/A )  Patient Location: Endoscopy Unit  Anesthesia Type:General  Level of Consciousness: sedated  Airway & Oxygen Therapy: Patient Spontanous Breathing and Patient connected to nasal cannula oxygen  Post-op Assessment: Report given to RN and Post -op Vital signs reviewed and stable  Post vital signs: Reviewed and stable  Last Vitals:  Vitals Value Taken Time  BP 110/61 01/07/19 1101  Temp    Pulse 61 01/07/19 1101  Resp 15 01/07/19 1101  SpO2 100 % 01/07/19 1101  Vitals shown include unvalidated device data.  Last Pain:  Vitals:   01/07/19 0942  TempSrc: Tympanic  PainSc: 0-No pain         Complications: No apparent anesthesia complications

## 2019-01-07 NOTE — Anesthesia Postprocedure Evaluation (Signed)
Anesthesia Post Note  Patient: Ethan Vega.  Procedure(s) Performed: COLONOSCOPY WITH PROPOFOL (N/A )  Patient location during evaluation: Endoscopy Anesthesia Type: General Level of consciousness: awake and alert Pain management: pain level controlled Vital Signs Assessment: post-procedure vital signs reviewed and stable Respiratory status: spontaneous breathing, nonlabored ventilation, respiratory function stable and patient connected to nasal cannula oxygen Cardiovascular status: blood pressure returned to baseline and stable Postop Assessment: no apparent nausea or vomiting Anesthetic complications: no     Last Vitals:  Vitals:   01/07/19 1100 01/07/19 1110  BP: 110/61 115/70  Pulse:    Resp:    Temp: 36.4 C   SpO2:      Last Pain:  Vitals:   01/07/19 1130  TempSrc:   PainSc: 0-No pain                 Martha Clan

## 2019-01-07 NOTE — Op Note (Signed)
[facility] Gastroenterology Patient Name: Ethan Vega Procedure Date: 01/07/2019 10:25 AM MRN: [id number] Account #: [id number] Date of Birth: 05/02/1945 Admit Type: Outpatient Age: 73 Room: [facility] ENDO ROOM 1 Gender: Male Note Status: Finalized Procedure:            Colonoscopy Indications:          Heme positive stool Providers:            [name]. Alice Reichert MD, MD Referring MD:         Venetia Maxon. Elijio Miles, MD (Referring MD) Medicines:            Monitored Anesthesia Care, Propofol per Anesthesia Complications:        No immediate complications. Procedure:            Pre-Anesthesia Assessment:                       - The risks and benefits of the procedure and the                        sedation options and risks were discussed with the                        patient. All questions were answered and informed                        consent was obtained.                       - Patient identification and proposed procedure were                        verified prior to the procedure by the nurse. The                        procedure was verified in the procedure room.                       - ASA Grade Assessment: III - A patient with severe                        systemic disease.                       - After reviewing the risks and benefits, the patient                        was deemed in satisfactory condition to undergo the                        procedure.                       After obtaining informed consent, the colonoscope was                        passed under direct vision. Throughout the procedure,                        the patient's blood pressure, pulse, and oxygen  saturations were monitored continuously. The                        Colonoscope was introduced through the anus and                        advanced to the the cecum, identified by appendiceal                        orifice and ileocecal valve. The colonoscopy was                         performed without difficulty. The patient tolerated the                        procedure well. The quality of the bowel preparation                        was good. The ileocecal valve, appendiceal orifice, and                        rectum were photographed. Findings:      The perianal and digital rectal examinations were normal. Pertinent       negatives include normal sphincter tone and no palpable rectal lesions.      Three sessile polyps were found in the splenic flexure. The polyps were       5 to 7 mm in size. These polyps were removed with a hot snare. Resection       and retrieval were complete. To prevent bleeding after the polypectomy,       three hemostatic clips were successfully placed (MR conditional). There       was no bleeding during, or at the end, of the procedure.      The exam was otherwise without abnormality on direct and retroflexion       views. Impression:           - Three 5 to 7 mm polyps at the splenic flexure,                        removed with a hot snare. Resected and retrieved. Clips                        (MR conditional) were placed.                       - The examination was otherwise normal on direct and                        retroflexion views. Recommendation:       - Patient has a contact number available for                        emergencies. The signs and symptoms of potential                        delayed complications were discussed with the patient.                        Return to normal activities tomorrow. Written discharge  instructions were provided to the patient.                       - Resume previous diet.                       - Continue present medications.                       - Resume Eliquis (apixaban) at prior dose tomorrow.                        Refer to managing physician for further adjustment of                        therapy.                       - Await pathology  results.                       - If polyps are benign or adenomatous without                        dysplasia, I will advise NO further colonoscopy due to                        advanced age and/or severe comorbidity.                       - The findings and recommendations were discussed with                        the patient. Procedure Code(s):    --- Professional ---                       [phone], Colonoscopy, flexible; with removal of tumor(s),                        polyp(s), or other lesion(s) by snare technique Diagnosis Code(s):    --- Professional ---                       R19.5, Other fecal abnormalities                       K63.5, Polyp of colon CPT copyright 2019 American Medical Association. All rights reserved. The codes documented in this report are preliminary and upon coder review may  be revised to meet current compliance requirements. Efrain Sella MD, MD 01/07/2019 11:02:15 AM This report has been signed electronically. Number of Addenda: 0 Note Initiated On: 01/07/2019 10:25 AM Scope Withdrawal Time: 0 hours 18 minutes 44 seconds  Total Procedure Duration: 0 hours 20 minutes 35 seconds  Estimated Blood Loss: Estimated blood loss: none.      [facility]

## 2019-01-08 LAB — SURGICAL PATHOLOGY

## 2019-01-09 ENCOUNTER — Telehealth: Payer: Self-pay | Admitting: Internal Medicine

## 2019-01-12 ENCOUNTER — Ambulatory Visit (INDEPENDENT_AMBULATORY_CARE_PROVIDER_SITE_OTHER): Payer: Medicare Other | Admitting: *Deleted

## 2019-01-12 DIAGNOSIS — I472 Ventricular tachycardia, unspecified: Secondary | ICD-10-CM

## 2019-01-12 DIAGNOSIS — I480 Paroxysmal atrial fibrillation: Secondary | ICD-10-CM

## 2019-01-12 LAB — CUP PACEART REMOTE DEVICE CHECK
Battery Remaining Longevity: 41 mo
Battery Remaining Percentage: 47 %
Battery Voltage: 2.92 V
Brady Statistic AP VP Percent: 1.7 %
Brady Statistic AP VS Percent: 98 %
Brady Statistic AS VP Percent: 1 %
Brady Statistic AS VS Percent: 1 %
Brady Statistic RA Percent Paced: 99 %
Brady Statistic RV Percent Paced: 1.7 %
Date Time Interrogation Session: 20201027080017
HighPow Impedance: 63 Ohm
HighPow Impedance: 63 Ohm
Implantable Lead Implant Date: 20160114
Implantable Lead Implant Date: 20160114
Implantable Lead Location: 753859
Implantable Lead Location: 753860
Implantable Lead Model: 7122
Implantable Pulse Generator Implant Date: 20160114
Lead Channel Impedance Value: 350 Ohm
Lead Channel Impedance Value: 380 Ohm
Lead Channel Pacing Threshold Amplitude: 0.5 V
Lead Channel Pacing Threshold Amplitude: 1.25 V
Lead Channel Pacing Threshold Pulse Width: 0.5 ms
Lead Channel Pacing Threshold Pulse Width: 0.6 ms
Lead Channel Sensing Intrinsic Amplitude: 1.8 mV
Lead Channel Sensing Intrinsic Amplitude: 9.2 mV
Lead Channel Setting Pacing Amplitude: 1.5 V
Lead Channel Setting Pacing Amplitude: 2.5 V
Lead Channel Setting Pacing Pulse Width: 0.6 ms
Lead Channel Setting Sensing Sensitivity: 0.5 mV
Pulse Gen Serial Number: 7241695

## 2019-01-15 NOTE — Telephone Encounter (Signed)
Patient calling to check on status   *STAT* If patient is at the pharmacy, call can be transferred to refill team.   1. Which medications need to be refilled? (please list name of each medication and dose if known) sotalol 80 MG   2. Which pharmacy/location (including street and city if local pharmacy) is medication to be sent to? [facility] [place]  3. Do they need a 30 day or 90 day supply? 90 day   Patient only has 6 pills left

## 2019-01-18 ENCOUNTER — Other Ambulatory Visit: Payer: Self-pay | Admitting: Internal Medicine

## 2019-01-18 NOTE — Telephone Encounter (Signed)
Please contact patient regarding Sotalol 80 mg one tablet twice a day.  I do not see the Sotalol on the patient's most current medication list nor do I see where it was discontinued.  Please review for refill on Sotalol.  Rx # [id number] for Sotalol. Patient is confused about his medications.  The patient will not have any pills left after tonight.

## 2019-01-18 NOTE — Telephone Encounter (Signed)
This encounter was created in error - please disregard.

## 2019-01-18 NOTE — Telephone Encounter (Signed)
Patient is calling to check status of Sotalol 80 mg rx.

## 2019-01-19 MED ORDER — SOTALOL HCL 80 MG PO TABS: 80.0000 mg | ORAL_TABLET | Freq: Two times a day (BID) | ORAL | 1 refills | Status: DC

## 2019-01-19 NOTE — Telephone Encounter (Signed)
I spoke with the patient. I advised him for some reason, his sotalol was d/c'ed off his med list at "not taking" at his 11/12/18 visit with Dr. Caryl Comes. He is aware that amiodarone ended up back on his med list some how, but not from our office.  Per the patient, he confirms he is not taking amiodarone. He does confirm he has been taking sotalol 80 mg BID. He took his last dose last night.  I have advised him I will correct him med list and [facility] [place] for his sotalol now.  The patient voices understanding and was very appreciative for the call back.

## 2019-01-19 NOTE — Addendum Note (Signed)
Addended byAlvis Lemmings C on: 01/19/2019 01:20 PM   Modules accepted: Orders

## 2019-02-03 NOTE — Progress Notes (Signed)
Remote ICD transmission.   

## 2019-02-23 ENCOUNTER — Encounter: Payer: Self-pay | Admitting: Internal Medicine

## 2019-03-29 ENCOUNTER — Other Ambulatory Visit: Payer: Self-pay | Admitting: *Deleted

## 2019-03-29 MED ORDER — POTASSIUM CHLORIDE CRYS ER 20 MEQ PO TBCR: EXTENDED_RELEASE_TABLET | ORAL | 3 refills | Status: DC

## 2019-04-12 ENCOUNTER — Other Ambulatory Visit: Payer: Self-pay

## 2019-04-12 MED ORDER — CARVEDILOL 25 MG PO TABS: 25.0000 mg | ORAL_TABLET | Freq: Two times a day (BID) | ORAL | 0 refills | Status: DC

## 2019-04-13 ENCOUNTER — Ambulatory Visit (INDEPENDENT_AMBULATORY_CARE_PROVIDER_SITE_OTHER): Payer: Medicare Other | Admitting: *Deleted

## 2019-04-13 DIAGNOSIS — I472 Ventricular tachycardia, unspecified: Secondary | ICD-10-CM

## 2019-04-14 LAB — CUP PACEART REMOTE DEVICE CHECK
Battery Remaining Longevity: 40 mo
Battery Remaining Percentage: 45 %
Battery Voltage: 2.9 V
Brady Statistic AP VP Percent: 1.3 %
Brady Statistic AP VS Percent: 98 %
Brady Statistic AS VP Percent: 1 %
Brady Statistic AS VS Percent: 1 %
Brady Statistic RA Percent Paced: 99 %
Brady Statistic RV Percent Paced: 1.3 %
Date Time Interrogation Session: 20210126040035
HighPow Impedance: 65 Ohm
HighPow Impedance: 65 Ohm
Implantable Lead Implant Date: 20160114
Implantable Lead Implant Date: 20160114
Implantable Lead Location: 753859
Implantable Lead Location: 753860
Implantable Lead Model: 7122
Implantable Pulse Generator Implant Date: 20160114
Lead Channel Impedance Value: 350 Ohm
Lead Channel Impedance Value: 380 Ohm
Lead Channel Pacing Threshold Amplitude: 0.5 V
Lead Channel Pacing Threshold Amplitude: 1.25 V
Lead Channel Pacing Threshold Pulse Width: 0.5 ms
Lead Channel Pacing Threshold Pulse Width: 0.6 ms
Lead Channel Sensing Intrinsic Amplitude: 1.7 mV
Lead Channel Sensing Intrinsic Amplitude: 10.9 mV
Lead Channel Setting Pacing Amplitude: 1.5 V
Lead Channel Setting Pacing Amplitude: 2.5 V
Lead Channel Setting Pacing Pulse Width: 0.6 ms
Lead Channel Setting Sensing Sensitivity: 0.5 mV
Pulse Gen Serial Number: 7241695

## 2019-05-18 ENCOUNTER — Encounter: Payer: Self-pay | Admitting: Internal Medicine

## 2019-05-18 ENCOUNTER — Other Ambulatory Visit: Payer: Self-pay

## 2019-05-18 ENCOUNTER — Ambulatory Visit (INDEPENDENT_AMBULATORY_CARE_PROVIDER_SITE_OTHER): Payer: Medicare Other | Admitting: Internal Medicine

## 2019-05-18 VITALS — BP 112/64 | HR 60 | Ht 66.0 in | Wt 240.0 lb

## 2019-05-18 DIAGNOSIS — I480 Paroxysmal atrial fibrillation: Secondary | ICD-10-CM | POA: Diagnosis not present

## 2019-05-18 DIAGNOSIS — I469 Cardiac arrest, cause unspecified: Secondary | ICD-10-CM

## 2019-05-18 DIAGNOSIS — I472 Ventricular tachycardia, unspecified: Secondary | ICD-10-CM

## 2019-05-18 DIAGNOSIS — Z79899 Other long term (current) drug therapy: Secondary | ICD-10-CM

## 2019-05-18 DIAGNOSIS — I428 Other cardiomyopathies: Secondary | ICD-10-CM | POA: Diagnosis not present

## 2019-05-18 DIAGNOSIS — Z95810 Presence of automatic (implantable) cardiac defibrillator: Secondary | ICD-10-CM

## 2019-05-18 DIAGNOSIS — I5042 Chronic combined systolic (congestive) and diastolic (congestive) heart failure: Secondary | ICD-10-CM

## 2019-05-18 LAB — CUP PACEART INCLINIC DEVICE CHECK
Battery Remaining Longevity: 42 mo
Brady Statistic RA Percent Paced: 99 %
Brady Statistic RV Percent Paced: 1.1 %
Date Time Interrogation Session: 20210302091357
HighPow Impedance: 64.125
Implantable Lead Implant Date: 20160114
Implantable Lead Implant Date: 20160114
Implantable Lead Location: 753859
Implantable Lead Location: 753860
Implantable Lead Model: 7122
Implantable Pulse Generator Implant Date: 20160114
Lead Channel Impedance Value: 350 Ohm
Lead Channel Impedance Value: 375 Ohm
Lead Channel Pacing Threshold Amplitude: 0.625 V
Lead Channel Pacing Threshold Amplitude: 1 V
Lead Channel Pacing Threshold Pulse Width: 0.5 ms
Lead Channel Pacing Threshold Pulse Width: 0.6 ms
Lead Channel Sensing Intrinsic Amplitude: 3 mV
Lead Channel Sensing Intrinsic Amplitude: 8.8 mV
Lead Channel Setting Pacing Amplitude: 1.625
Lead Channel Setting Pacing Amplitude: 2.5 V
Lead Channel Setting Pacing Pulse Width: 0.6 ms
Lead Channel Setting Sensing Sensitivity: 0.5 mV
Pulse Gen Serial Number: 7241695

## 2019-05-18 NOTE — Progress Notes (Signed)
Patient Care Team: Jodi Marble, MD as PCP - General (Internal Medicine) Deboraha Sprang, MD as PCP - Cardiology (Cardiology)   HPI  Ethan Vega. is a 74 y.o. male Seen following ICD implantation 1/16 following aborted cardiac arrest occurring in the context of nonischemic cardiomyopathy with an abnormal thallium scan suggesting sarcoid    CT-high resolution was neg for interstitial/LN   There were small nodules  Flecainide challenge was negative ECG  Low Volts R-V1 with RAD with mild RA enlargement SAECG not done as he was in AFib RVR and rate related RBBB  He has a history of atrial fibrillation.  Rx apixaban and amiodarone.    History of syncopal ventricular tachycardia of 12/18 prompting up titration of his amiodarone.  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    Recurrent VT--multiple episodes since stopping amio, many of which have not been terminated by ATP--on one occasion, therapies were failed to be delivered as VT below detection.  Device reprogrammed and sotalol started  4/20  Dyspnea on exertion, some edema but denies orthopnea or PND;  Vague intermittent non exertional chest pains, dur < 3 sec.  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 No ischemia  2/18 ECHO  40-45 %   7/20 TPP scan  Unlikely TTR amyloid  7/20 Echo  35-40% PA press est 35     Antiarrhythmics Date Reason stopped  amiodarone  /  Optic neuritis  Sotalol  4/20         Past  Medical History:  Diagnosis Date  . AICD (automatic cardioverter/defibrillator) present 03/31/14   a. 03/2014 s/p SJM 2411-36C dual chamber AICD (serial Number [id number])- followed by Dr. Caryl Comes  . AICD (automatic cardioverter/defibrillator) present   . Arthritis    a. L knee.  . Asthma   . Atrial fibrillation ([place])   . Cancer ([place])    Melanoma resected from scalp  . Cardiac arrest [facility])    a. 03/30/2014 VF arrest in setting of NICM >> CPR/defib in community >> s/p dual chamber AICD  . Cardiac arrest ([place])   . Coronary artery disease, non-occlusive    a. cath 03/2014 at [facility]: no sig CAD (OM1 30%, CFX 30%), EF 35%  . HLD (hyperlipidemia)   . HTN (hypertension)   . Kidney stones   . Melanoma ([place])   . NICM (nonischemic cardiomyopathy) ([place])    a. 03/2014 Echo:  Inferolateral and lateral HK, EF 99991111, grade 1 diastolic dysfunction, mild MR, mild LAE, mild RAE, trivial TR, no effusion.  . Paroxysmal atrial fibrillation ([place]) 03/28/2015  .  Retinal artery occlusion     Past Surgical History:  Procedure Laterality Date  . adenomatous polyps    . CARDIAC CATHETERIZATION  03/30/2014  . CARDIOVERSION  03/30/2014  . COLONOSCOPY WITH PROPOFOL N/A 03/24/2017   Procedure: COLONOSCOPY WITH PROPOFOL;  Surgeon: Lollie Sails, MD;  Location: [facility] ENDOSCOPY;  Service: Endoscopy;  Laterality: N/A;  . COLONOSCOPY WITH PROPOFOL N/A 01/07/2019   Procedure: COLONOSCOPY WITH PROPOFOL;  Surgeon: [place], [name], MD;  Location: [facility] ENDOSCOPY;  Service: Gastroenterology;  Laterality: N/A;  . CYSTOSCOPY W/ LITHOLAPAXY / EHL    . IMPLANTABLE CARDIOVERTER DEFIBRILLATOR IMPLANT N/A 03/31/2014   Procedure: IMPLANTABLE CARDIOVERTER DEFIBRILLATOR IMPLANT;  Surgeon: Evans Lance, MD;  Location: [facility] CATH LAB;  Service: Cardiovascular;  Laterality: N/A;  . JOINT REPLACEMENT    . TONSILLECTOMY     "as a kid"  . TOTAL KNEE ARTHROPLASTY Left ~ 2010  . TOTAL KNEE ARTHROPLASTY WITH REVISION COMPONENTS Left ~ 2011  .  VARICOSE VEIN SURGERY Left ~ 2014    Current Outpatient Medications  Medication Sig Dispense Refill  . ADVAIR DISKUS 250-50 MCG/DOSE AEPB INHALE 1 PUFF INTO THE LUNGS 2 (TWO) TIMES DAILY. RINSE MOUTH AFTER USE. 60 each 1  . apixaban (ELIQUIS) 5 MG TABS tablet Take 1 tablet (5 mg total) by mouth 2 (two) times daily. 180 tablet 3  . atorvastatin (LIPITOR) 10 MG tablet Take 1 tablet (10 mg total) by mouth daily. 90 tablet 3  . carvedilol (COREG) 25 MG tablet Take 1 tablet (25 mg total) by mouth 2 (two) times daily with a meal. 180 tablet 0  . furosemide (LASIX) 40 MG tablet Take 40 mg by mouth.    . levothyroxine (SYNTHROID, LEVOTHROID) 75 MCG tablet Take 75 mcg by mouth daily.  2  . losartan (COZAAR) 100 MG tablet Take 100 mg by mouth daily.    . potassium chloride SA (KLOR-CON M20) 20 MEQ tablet TAKE 1 TABLET BY MOUTH EVERY DAY 30 tablet 3  . sotalol (BETAPACE) 80 MG tablet Take 1 tablet (80 mg total) by mouth 2 (two) times daily. 180 tablet 1   No current facility-administered medications for this visit.   Facility-Administered Medications Ordered in Other Visits  Medication Dose Route Frequency Provider Last Rate Last Admin  . technetium tetrofosmin (TC-MYOVIEW) injection 123XX123 millicurie  123XX123 millicurie Intravenous Once PRN Fay Records, MD        No Known Allergies  Review of Systems negative except from HPI and PMH  Physical Exam  BP 112/64 (BP Location: Left Arm, Patient Position: Sitting, Cuff Size: Large)   Pulse 60   Ht 5\' 6"  (1.676 m)   Wt 240 lb (108.9 kg)   SpO2 90%   BMI 38.74 kg/m  Well developed and well nourished in no acute distress HENT normal Neck supple with JVP-8+ Clear Device pocket well healed; without hematoma or erythema.  There is no tethering  Regular rate and rhythm, no  gallop No murmur Abd-soft with active BS No Clubbing cyanosis 1+ edema Skin-warm and dry A & Oriented  Grossly normal sensory and motor function  ECG sinus @ 60 with occ A and  V pacing Low Volts RBBB LAFB  Unchanged from 2/20 Personally reviewed    Assessment and  Plan  Aborted Cardiac arrest//FHx Cardiac Arrest   ICD  St Jude  The patient's device was interrogated.  The information was reviewed. No changes were made in the programming.     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 Might be related to obesity, As above but may also be volume overload, evident on exam  Will increase furosemide 40 >>80 qd x 3d then resume

## 2019-05-18 NOTE — Patient Instructions (Signed)
Medication Instructions:  - Your physician has recommended you make the following change in your medication:   1) Increase lasix (furosemide) 40 mg- take 2 tablets (80 mg) by mouth once a day x 5 days, then resume normal dosing   *If you need a refill on your cardiac medications before your next appointment, please call your pharmacy*   Lab Work: - Your physician recommends that you have lab work today: BMP/ CBC/ Magnesium  If you have labs (blood work) drawn today and your tests are completely normal, you will receive your results only by: Marland Kitchen MyChart Message (if you have MyChart) OR . A paper copy in the mail If you have any lab test that is abnormal or we need to change your treatment, we will call you to review the results.   Testing/Procedures: - none ordered   Follow-Up: At [facility], you and your health needs are our priority.  As part of our continuing mission to provide you with exceptional heart care, we have created designated Provider Care Teams.  These Care Teams include your primary Cardiologist (physician) and Advanced Practice Providers (APPs -  Physician Assistants and Nurse Practitioners) who all work together to provide you with the care you need, when you need it.  We recommend signing up for the patient portal called "MyChart".  Sign up information is provided on this After Visit Summary.  MyChart is used to connect with patients for Virtual Visits (Telemedicine).  Patients are able to view lab/test results, encounter notes, upcoming appointments, etc.  Non-urgent messages can be sent to your provider as well.   To learn more about what you can do with MyChart, go to [email or web address].    Your next appointment:   6 month(s)  The format for your next appointment:   In Person  Provider:   Virl Axe, MD   Other Instructions n/a

## 2019-05-19 LAB — BASIC METABOLIC PANEL
BUN/Creatinine Ratio: 17 (ref 10–24)
BUN: 19 mg/dL (ref 8–27)
CO2: 24 mmol/L (ref 20–29)
Calcium: 9.1 mg/dL (ref 8.6–10.2)
Chloride: 106 mmol/L (ref 96–106)
Creatinine, Ser: 1.14 mg/dL (ref 0.76–1.27)
GFR calc Af Amer: 72 mL/min/{1.73_m2} (ref >59)
GFR calc non Af Amer: 63 mL/min/{1.73_m2} (ref >59)
Glucose: 104 mg/dL — ABNORMAL HIGH (ref 65–99)
Potassium: 4.1 mmol/L (ref 3.5–5.2)
Sodium: 144 mmol/L (ref 134–144)

## 2019-05-19 LAB — CBC WITH DIFFERENTIAL/PLATELET
Basophils Absolute: 0 10*3/uL (ref 0.0–0.2)
Basos: 1 %
EOS (ABSOLUTE): 0.3 10*3/uL (ref 0.0–0.4)
Eos: 5 %
Hematocrit: 40 % (ref 37.5–51.0)
Hemoglobin: 13.4 g/dL (ref 13.0–17.7)
Immature Grans (Abs): 0 10*3/uL (ref 0.0–0.1)
Immature Granulocytes: 0 %
Lymphocytes Absolute: 1.4 10*3/uL (ref 0.7–3.1)
Lymphs: 26 %
MCH: 30.8 pg (ref 26.6–33.0)
MCHC: 33.5 g/dL (ref 31.5–35.7)
MCV: 92 fL (ref 79–97)
Monocytes Absolute: 0.5 10*3/uL (ref 0.1–0.9)
Monocytes: 9 %
Neutrophils Absolute: 3.3 10*3/uL (ref 1.4–7.0)
Neutrophils: 59 %
Platelets: 212 10*3/uL (ref 150–450)
RBC: 4.35 x10E6/uL (ref 4.14–5.80)
RDW: 12.8 % (ref 11.6–15.4)
WBC: 5.6 10*3/uL (ref 3.4–10.8)

## 2019-05-19 LAB — MAGNESIUM: Magnesium: 2 mg/dL (ref 1.6–2.3)

## 2019-05-31 ENCOUNTER — Other Ambulatory Visit: Payer: Self-pay | Admitting: Internal Medicine

## 2019-05-31 NOTE — Telephone Encounter (Signed)
Prescription refill request for Eliquis received.  Last office visit: Caryl Comes, 05/18/2019 Scr: 1.14, 05/18/2019 Age: 74 y.o.  Weight: 108.9 kg   Prescription refill sent

## 2019-06-26 ENCOUNTER — Other Ambulatory Visit: Payer: Self-pay | Admitting: Internal Medicine

## 2019-06-28 NOTE — Telephone Encounter (Signed)
Please review for refill on Losartan. I am not sure who increased the dose increased the dose of Losartan from 50 mg to 100 mg.

## 2019-07-08 ENCOUNTER — Other Ambulatory Visit: Payer: Self-pay | Admitting: Internal Medicine

## 2019-07-10 ENCOUNTER — Other Ambulatory Visit: Payer: Self-pay | Admitting: Internal Medicine

## 2019-07-13 ENCOUNTER — Ambulatory Visit (INDEPENDENT_AMBULATORY_CARE_PROVIDER_SITE_OTHER): Payer: Medicare Other | Admitting: *Deleted

## 2019-07-13 DIAGNOSIS — I472 Ventricular tachycardia, unspecified: Secondary | ICD-10-CM

## 2019-07-13 LAB — CUP PACEART REMOTE DEVICE CHECK
Battery Remaining Longevity: 37 mo
Battery Remaining Percentage: 42 %
Battery Voltage: 2.9 V
Brady Statistic AP VP Percent: 1 %
Brady Statistic AP VS Percent: 98 %
Brady Statistic AS VP Percent: 1 %
Brady Statistic AS VS Percent: 1 %
Brady Statistic RA Percent Paced: 96 %
Brady Statistic RV Percent Paced: 1 %
Date Time Interrogation Session: 20210427100831
HighPow Impedance: 69 Ohm
HighPow Impedance: 69 Ohm
Implantable Lead Implant Date: 20160114
Implantable Lead Implant Date: 20160114
Implantable Lead Location: 753859
Implantable Lead Location: 753860
Implantable Lead Model: 7122
Implantable Pulse Generator Implant Date: 20160114
Lead Channel Impedance Value: 350 Ohm
Lead Channel Impedance Value: 380 Ohm
Lead Channel Pacing Threshold Amplitude: 0.5 V
Lead Channel Pacing Threshold Amplitude: 1 V
Lead Channel Pacing Threshold Pulse Width: 0.5 ms
Lead Channel Pacing Threshold Pulse Width: 0.6 ms
Lead Channel Sensing Intrinsic Amplitude: 2.5 mV
Lead Channel Sensing Intrinsic Amplitude: 9.5 mV
Lead Channel Setting Pacing Amplitude: 1.5 V
Lead Channel Setting Pacing Amplitude: 2.5 V
Lead Channel Setting Pacing Pulse Width: 0.6 ms
Lead Channel Setting Sensing Sensitivity: 0.5 mV
Pulse Gen Serial Number: 7241695

## 2019-07-14 NOTE — Progress Notes (Signed)
ICD Remote  

## 2019-10-08 ENCOUNTER — Other Ambulatory Visit: Payer: Self-pay | Admitting: Internal Medicine

## 2019-10-12 ENCOUNTER — Ambulatory Visit (INDEPENDENT_AMBULATORY_CARE_PROVIDER_SITE_OTHER): Payer: Medicare Other | Admitting: *Deleted

## 2019-10-12 DIAGNOSIS — I428 Other cardiomyopathies: Secondary | ICD-10-CM

## 2019-10-13 LAB — CUP PACEART REMOTE DEVICE CHECK
Battery Remaining Longevity: 36 mo
Battery Remaining Percentage: 41 %
Battery Voltage: 2.89 V
Brady Statistic AP VP Percent: 1 %
Brady Statistic AP VS Percent: 97 %
Brady Statistic AS VP Percent: 1 %
Brady Statistic AS VS Percent: 1 %
Brady Statistic RA Percent Paced: 95 %
Brady Statistic RV Percent Paced: 1 %
Date Time Interrogation Session: 20210727040016
HighPow Impedance: 72 Ohm
HighPow Impedance: 72 Ohm
Implantable Lead Implant Date: 20160114
Implantable Lead Implant Date: 20160114
Implantable Lead Location: 753859
Implantable Lead Location: 753860
Implantable Lead Model: 7122
Implantable Pulse Generator Implant Date: 20160114
Lead Channel Impedance Value: 360 Ohm
Lead Channel Impedance Value: 380 Ohm
Lead Channel Pacing Threshold Amplitude: 0.625 V
Lead Channel Pacing Threshold Amplitude: 1 V
Lead Channel Pacing Threshold Pulse Width: 0.5 ms
Lead Channel Pacing Threshold Pulse Width: 0.6 ms
Lead Channel Sensing Intrinsic Amplitude: 3.1 mV
Lead Channel Sensing Intrinsic Amplitude: 9.6 mV
Lead Channel Setting Pacing Amplitude: 1.625
Lead Channel Setting Pacing Amplitude: 2.5 V
Lead Channel Setting Pacing Pulse Width: 0.6 ms
Lead Channel Setting Sensing Sensitivity: 0.5 mV
Pulse Gen Serial Number: 7241695

## 2019-10-16 NOTE — Progress Notes (Signed)
Remote ICD transmission.   

## 2019-11-23 ENCOUNTER — Other Ambulatory Visit: Payer: Self-pay

## 2019-11-23 ENCOUNTER — Encounter: Payer: Self-pay | Admitting: Internal Medicine

## 2019-11-23 ENCOUNTER — Other Ambulatory Visit
Admission: RE | Admit: 2019-11-23 | Discharge: 2019-11-23 | Disposition: A | Discharge status: Home / Self Care | Payer: Medicare Other | Attending: Internal Medicine | Admitting: Internal Medicine

## 2019-11-23 ENCOUNTER — Ambulatory Visit (INDEPENDENT_AMBULATORY_CARE_PROVIDER_SITE_OTHER): Payer: Medicare Other | Admitting: Internal Medicine

## 2019-11-23 VITALS — BP 124/74 | HR 67 | Ht 66.0 in | Wt 244.5 lb

## 2019-11-23 DIAGNOSIS — Z79899 Other long term (current) drug therapy: Secondary | ICD-10-CM | POA: Insufficient documentation

## 2019-11-23 DIAGNOSIS — I469 Cardiac arrest, cause unspecified: Secondary | ICD-10-CM | POA: Diagnosis not present

## 2019-11-23 DIAGNOSIS — Z95810 Presence of automatic (implantable) cardiac defibrillator: Secondary | ICD-10-CM | POA: Diagnosis not present

## 2019-11-23 DIAGNOSIS — I5042 Chronic combined systolic (congestive) and diastolic (congestive) heart failure: Secondary | ICD-10-CM

## 2019-11-23 DIAGNOSIS — I428 Other cardiomyopathies: Secondary | ICD-10-CM | POA: Diagnosis not present

## 2019-11-23 DIAGNOSIS — I480 Paroxysmal atrial fibrillation: Secondary | ICD-10-CM

## 2019-11-23 LAB — BASIC METABOLIC PANEL
Anion gap: 9 (ref 5–15)
BUN: 19 mg/dL (ref 8–23)
CO2: 26 mmol/L (ref 22–32)
Calcium: 9.2 mg/dL (ref 8.9–10.3)
Chloride: 103 mmol/L (ref 98–111)
Creatinine, Ser: 1 mg/dL (ref 0.61–1.24)
GFR calc Af Amer: >60 mL/min (ref >60)
GFR calc non Af Amer: >60 mL/min (ref >60)
Glucose, Bld: 103 mg/dL — ABNORMAL HIGH (ref 70–99)
Potassium: 4.2 mmol/L (ref 3.5–5.1)
Sodium: 138 mmol/L (ref 135–145)

## 2019-11-23 LAB — CUP PACEART INCLINIC DEVICE CHECK
Battery Remaining Longevity: 37 mo
Brady Statistic RA Percent Paced: 95 %
Brady Statistic RV Percent Paced: 0.46 %
Date Time Interrogation Session: 20210907090219
HighPow Impedance: 75.375
Implantable Lead Implant Date: 20160114
Implantable Lead Implant Date: 20160114
Implantable Lead Location: 753859
Implantable Lead Location: 753860
Implantable Lead Model: 7122
Implantable Pulse Generator Implant Date: 20160114
Lead Channel Impedance Value: 362.5 Ohm
Lead Channel Impedance Value: 412.5 Ohm
Lead Channel Pacing Threshold Amplitude: 0.625 V
Lead Channel Pacing Threshold Amplitude: 1 V
Lead Channel Pacing Threshold Pulse Width: 0.5 ms
Lead Channel Pacing Threshold Pulse Width: 0.6 ms
Lead Channel Sensing Intrinsic Amplitude: 10.6 mV
Lead Channel Sensing Intrinsic Amplitude: 3.1 mV
Lead Channel Setting Pacing Amplitude: 1.25 V
Lead Channel Setting Pacing Amplitude: 1.625
Lead Channel Setting Pacing Pulse Width: 0.6 ms
Lead Channel Setting Sensing Sensitivity: 0.5 mV
Pulse Gen Serial Number: 7241695

## 2019-11-23 LAB — CBC WITH DIFFERENTIAL/PLATELET
Abs Immature Granulocytes: 0.02 10*3/uL (ref 0.00–0.07)
Basophils Absolute: 0.1 10*3/uL (ref 0.0–0.1)
Basophils Relative: 1 %
Eosinophils Absolute: 0.3 10*3/uL (ref 0.0–0.5)
Eosinophils Relative: 4 %
HCT: 39.1 % (ref 39.0–52.0)
Hemoglobin: 13.1 g/dL (ref 13.0–17.0)
Immature Granulocytes: 0 %
Lymphocytes Relative: 25 %
Lymphs Abs: 1.6 10*3/uL (ref 0.7–4.0)
MCH: 31 pg (ref 26.0–34.0)
MCHC: 33.5 g/dL (ref 30.0–36.0)
MCV: 92.7 fL (ref 80.0–100.0)
Monocytes Absolute: 0.6 10*3/uL (ref 0.1–1.0)
Monocytes Relative: 9 %
Neutro Abs: 3.9 10*3/uL (ref 1.7–7.7)
Neutrophils Relative %: 61 %
Platelets: 205 10*3/uL (ref 150–400)
RBC: 4.22 MIL/uL (ref 4.22–5.81)
RDW: 13.4 % (ref 11.5–15.5)
WBC: 6.4 10*3/uL (ref 4.0–10.5)
nRBC: 0 % (ref 0.0–0.2)

## 2019-11-23 LAB — PACEMAKER DEVICE OBSERVATION

## 2019-11-23 LAB — MAGNESIUM: Magnesium: 2.3 mg/dL (ref 1.7–2.4)

## 2019-11-23 MED ORDER — FUROSEMIDE 40 MG PO TABS: ORAL_TABLET | ORAL | 2 refills | Status: DC

## 2019-11-23 NOTE — Patient Instructions (Signed)
Medication Instructions:  - Your physician has recommended you make the following change in your medication:   1) Increase lasix (furosemide) 40 mg- take 1 tablet by mouth twice a day 2 days a week and take 1 tablet a day all other days  *If you need a refill on your cardiac medications before your next appointment, please call your pharmacy*   Lab Work: - Your physician recommends that you have lab work today: BMP/ CBC/ Magnesium  If you have labs (blood work) drawn today and your tests are completely normal, you will receive your results only by: Marland Kitchen MyChart Message (if you have MyChart) OR . A paper copy in the mail If you have any lab test that is abnormal or we need to change your treatment, we will call you to review the results.   Testing/Procedures: - none ordered   Follow-Up: At [facility], you and your health needs are our priority.  As part of our continuing mission to provide you with exceptional heart care, we have created designated Provider Care Teams.  These Care Teams include your primary Cardiologist (physician) and Advanced Practice Providers (APPs -  Physician Assistants and Nurse Practitioners) who all work together to provide you with the care you need, when you need it.  We recommend signing up for the patient portal called "MyChart".  Sign up information is provided on this After Visit Summary.  MyChart is used to connect with patients for Virtual Visits (Telemedicine).  Patients are able to view lab/test results, encounter notes, upcoming appointments, etc.  Non-urgent messages can be sent to your provider as well.   To learn more about what you can do with MyChart, go to [email or web address].    Your next appointment:   6 month(s)  The format for your next appointment:   In Person  Provider:   Virl Axe, MD   Other Instructions n/a

## 2019-11-23 NOTE — Progress Notes (Signed)
Patient Care Team: Jodi Marble, MD as PCP - General (Internal Medicine) Deboraha Sprang, MD as PCP - Cardiology (Cardiology)   HPI  Ethan Furnace Sutton Hirsch. is a 74 y.o. 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   Now on sotalol.  Flecainide challenge was negative ECG  Low Volts R-V1 with RAD with mild RA enlargement SAECG not done as he was in AFib RVR and rate related RBBB  He has a history of atrial fibrillation anticoagulation w Apixoban    History of syncopal ventricular tachycardia of 12/18   The patient denies chest pain, nocturnal dyspnea, orthopnea .  There have been no palpitations, lightheadedness or syncope  Chronic stable sob and mild edema Fluid intake replete .      TERF  Stroke-2, age 45, HTN-1 DM -1 for CHADSVAS score >= 5                 Date Cr K Hgb  Mg  1/17       8/17  0.82  14.4   5/18 0.87  13.2   12/18 1.43 4.0 14.3   5/19 0.83 4.0 14.2   3/20 1.09 3.7 13.4   5/20 1.10 4.3  2.3  10/20 0.94 3.9    3/21 1.14 4.1 13.4 2.0             DATE TEST EF   1/16 Cath  30-35%  CA normal   4/16  Echo   45-50 %   5/16 myoview    prior infarct?? No ischemia  2/18 ECHO  40-45 %   7/20 TPP scan  Unlikely TTR amyloid  7/20 Echo  35-40% PA press est 35     Antiarrhythmics Date Reason stopped  amiodarone  /  Optic neuritis  Sotalol  4/20         Past Medical History:  Diagnosis Date  . AICD (automatic cardioverter/defibrillator) present 03/31/14   a. 03/2014 s/p SJM 2411-36C dual chamber AICD (serial Number [id number])- followed by Dr. Caryl Comes  . AICD (automatic cardioverter/defibrillator) present   . Arthritis    a. L knee.  .  Asthma   . Atrial fibrillation ([place])   . Cancer ([place] [place])    Melanoma resected from scalp  . Cardiac arrest [facility])    a. 03/30/2014 VF arrest in setting of NICM >> CPR/defib in community >> s/p dual chamber AICD  . Cardiac arrest ([place])   . Coronary artery disease, non-occlusive    a. cath 03/2014 at [facility]: no sig CAD (OM1 30%, CFX 30%), EF 35%  . HLD (hyperlipidemia)   . HTN (hypertension)   . Kidney stones   . Melanoma ([place])   . NICM (nonischemic cardiomyopathy) ([place])    a. 03/2014 Echo:  Inferolateral and lateral HK, EF 68-12%, grade 1 diastolic dysfunction, mild MR, mild LAE, mild RAE, trivial TR, no effusion.  . Paroxysmal atrial fibrillation ([place]) 03/28/2015  . Retinal artery occlusion     Past Surgical History:  Procedure Laterality Date  . adenomatous polyps    . CARDIAC CATHETERIZATION  03/30/2014  . CARDIOVERSION  03/30/2014  . COLONOSCOPY WITH PROPOFOL N/A 03/24/2017   Procedure: COLONOSCOPY WITH PROPOFOL;  Surgeon: Gustavo Lah,  Billie Ruddy, MD;  Location: [facility] ENDOSCOPY;  Service: Endoscopy;  Laterality: N/A;  . COLONOSCOPY WITH PROPOFOL N/A 01/07/2019   Procedure: COLONOSCOPY WITH PROPOFOL;  Surgeon: Toledo, [name], MD;  Location: [facility] ENDOSCOPY;  Service: Gastroenterology;  Laterality: N/A;  . CYSTOSCOPY W/ LITHOLAPAXY / EHL    . IMPLANTABLE CARDIOVERTER DEFIBRILLATOR IMPLANT N/A 03/31/2014   Procedure: IMPLANTABLE CARDIOVERTER DEFIBRILLATOR IMPLANT;  Surgeon: Evans Lance, MD;  Location: [facility] CATH LAB;  Service: Cardiovascular;  Laterality: N/A;  . JOINT REPLACEMENT    . TONSILLECTOMY     "as a kid"  . TOTAL KNEE ARTHROPLASTY Left ~ 2010  . TOTAL KNEE ARTHROPLASTY WITH REVISION COMPONENTS Left ~ 2011  . VARICOSE VEIN SURGERY Left ~ 2014    Current Outpatient Medications  Medication Sig Dispense Refill  . ADVAIR DISKUS 250-50 MCG/DOSE AEPB INHALE 1 PUFF INTO THE LUNGS 2 (TWO) TIMES DAILY. RINSE MOUTH AFTER USE. 60 each 1  . atorvastatin (LIPITOR) 10 MG tablet Take 1 tablet (10 mg  total) by mouth daily. 90 tablet 3  . carvedilol (COREG) 25 MG tablet TAKE 1 TABLET (25 MG TOTAL) BY MOUTH 2 (TWO) TIMES DAILY WITH A MEAL. 180 tablet 2  . ELIQUIS 5 MG TABS tablet TAKE 1 TABLET BY MOUTH TWICE A DAY 180 tablet 1  . furosemide (LASIX) 40 MG tablet TAKE 1 TABLET BY MOUTH EVERY DAY 90 tablet 2  . levothyroxine (SYNTHROID, LEVOTHROID) 75 MCG tablet Take 75 mcg by mouth daily.  2  . losartan (COZAAR) 100 MG tablet TAKE 1 TABLET BY MOUTH EVERY DAY 90 tablet 2  . potassium chloride SA (KLOR-CON M20) 20 MEQ tablet TAKE 1 TABLET BY MOUTH EVERY DAY 30 tablet 3  . sotalol (BETAPACE) 80 MG tablet TAKE 1 TABLET (80 MG TOTAL) BY MOUTH 2 (TWO) TIMES DAILY. 90 tablet 3   No current facility-administered medications for this visit.   Facility-Administered Medications Ordered in Other Visits  Medication Dose Route Frequency Provider Last Rate Last Admin  . technetium tetrofosmin (TC-MYOVIEW) injection 50.3 millicurie  54.6 millicurie Intravenous Once PRN Fay Records, MD        No Known Allergies  Review of Systems negative except from HPI and PMH  Physical Exam  BP 124/74   Pulse 67   Ht 5\' 6"  (1.676 m)   Wt 244 lb 8 oz (110.9 kg)   SpO2 95%   BMI 39.46 kg/m  Well developed and well nourished in no acute distress HENT normal Neck supple with JVP-8 Clear Device pocket well healed; without hematoma or erythema.  There is no tethering  Regular rate and rhythm, no  gallop No murmur Abd-soft with active BS No Clubbing cyanosis 2+ edema Skin-warm and dry A & Oriented  Grossly normal sensory and motor function  ECG A pacing @ 60 24/09/40 RBBB/LAD  Low voltage    Assessment and  Plan  Aborted Cardiac arrest//FHx Cardiac Arrest   ICD  St Jude  The patient's device was interrogated.  The information was reviewed. No changes were made in the programming.     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  Increase furosemide to 40 bid 2/w continuing 40 daily  BP well controlled

## 2019-11-28 ENCOUNTER — Other Ambulatory Visit: Payer: Self-pay | Admitting: Internal Medicine

## 2019-11-29 ENCOUNTER — Other Ambulatory Visit: Payer: Self-pay

## 2019-11-29 NOTE — Telephone Encounter (Signed)
*  STAT* If patient is at the pharmacy, call can be transferred to refill team.   1. Which medications need to be refilled? (please list name of each medication and dose if known) ELIQUIS  2. Which pharmacy/location (including street and city if local pharmacy) is medication to be sent to?[facility]  3. Do they need a 30 day or 90 day supply? [place]

## 2019-11-29 NOTE — Telephone Encounter (Signed)
Pt's age 74, wt 110.9 kg, SCr 1.00, CrCl 98.58, last ov w/ SK 11/23/19.

## 2019-11-29 NOTE — Telephone Encounter (Signed)
Please review for refill. Thanks!  

## 2019-12-30 ENCOUNTER — Other Ambulatory Visit: Payer: Self-pay | Admitting: Internal Medicine

## 2020-01-11 ENCOUNTER — Ambulatory Visit (INDEPENDENT_AMBULATORY_CARE_PROVIDER_SITE_OTHER): Payer: Medicare Other

## 2020-01-11 ENCOUNTER — Telehealth: Payer: Self-pay

## 2020-01-11 DIAGNOSIS — R9431 Abnormal electrocardiogram [ECG] [EKG]: Secondary | ICD-10-CM | POA: Diagnosis not present

## 2020-01-11 LAB — CUP PACEART REMOTE DEVICE CHECK
Battery Remaining Longevity: 34 mo
Battery Remaining Percentage: 37 %
Battery Voltage: 2.87 V
Brady Statistic AP VP Percent: 1 %
Brady Statistic AP VS Percent: 97 %
Brady Statistic AS VP Percent: 1 %
Brady Statistic AS VS Percent: 1.4 %
Brady Statistic RA Percent Paced: 95 %
Brady Statistic RV Percent Paced: 1 %
Date Time Interrogation Session: 20211026040017
HighPow Impedance: 71 Ohm
HighPow Impedance: 71 Ohm
Implantable Lead Implant Date: 20160114
Implantable Lead Implant Date: 20160114
Implantable Lead Location: 753859
Implantable Lead Location: 753860
Implantable Lead Model: 7122
Implantable Pulse Generator Implant Date: 20160114
Lead Channel Impedance Value: 360 Ohm
Lead Channel Impedance Value: 380 Ohm
Lead Channel Pacing Threshold Amplitude: 0.625 V
Lead Channel Pacing Threshold Amplitude: 1 V
Lead Channel Pacing Threshold Pulse Width: 0.5 ms
Lead Channel Pacing Threshold Pulse Width: 0.6 ms
Lead Channel Sensing Intrinsic Amplitude: 10.1 mV
Lead Channel Sensing Intrinsic Amplitude: 3.2 mV
Lead Channel Setting Pacing Amplitude: 1.25 V
Lead Channel Setting Pacing Amplitude: 1.625
Lead Channel Setting Pacing Pulse Width: 0.6 ms
Lead Channel Setting Sensing Sensitivity: 0.5 mV
Pulse Gen Serial Number: 7241695

## 2020-01-11 NOTE — Telephone Encounter (Signed)
Noted  

## 2020-01-11 NOTE — Telephone Encounter (Signed)
Merlin alert received- 1 VT episode 01/10/20 @ 1905, avg VR 144bpm converted with ATP x 1 --> ApVs. 3 NSVT.   Patient medications include Carvedilol 25mg  BID, Eliquis 5 mg BID, Sotalol 80 mg BID.    Attempted to reach pt, no answer, left VM requesting callback.  Also call pt daughter, Breaker (DPR on file).  She spoke with pt by text this AM, no reports of issues.  She will have him return our call.    Patient returned phone call, he was not aware of any treatment last night.  At time of event he was sitting in chair watching TV, he denies any cardiac symptoms.  Pt confirms compliance with medications as ordered.    Advised of DMV driving restrictions for 6 months following therapy.  Pt states he does not drive.  Will forward to MD for review, anticipate continued monitoring will notify him if any other changes.

## 2020-01-12 NOTE — Telephone Encounter (Signed)
Spoke with pt, advised per Dr. Caryl Comes continued monitoring, no changes at this time.

## 2020-01-12 NOTE — Telephone Encounter (Signed)
Patient calling back and is curious if he should make an appointment based off of this information. Patients family is concerned

## 2020-01-17 NOTE — Progress Notes (Signed)
Remote ICD transmission.   

## 2020-01-26 ENCOUNTER — Other Ambulatory Visit: Payer: Self-pay | Admitting: Internal Medicine

## 2020-01-31 ENCOUNTER — Other Ambulatory Visit: Payer: Self-pay

## 2020-01-31 ENCOUNTER — Ambulatory Visit: Payer: Medicare Other | Admitting: Dermatology

## 2020-01-31 ENCOUNTER — Encounter: Payer: Self-pay | Admitting: Dermatology

## 2020-01-31 DIAGNOSIS — D692 Other nonthrombocytopenic purpura: Secondary | ICD-10-CM

## 2020-01-31 DIAGNOSIS — D225 Melanocytic nevi of trunk: Secondary | ICD-10-CM | POA: Diagnosis not present

## 2020-01-31 DIAGNOSIS — D485 Neoplasm of uncertain behavior of skin: Secondary | ICD-10-CM

## 2020-01-31 DIAGNOSIS — L814 Other melanin hyperpigmentation: Secondary | ICD-10-CM

## 2020-01-31 DIAGNOSIS — D229 Melanocytic nevi, unspecified: Secondary | ICD-10-CM

## 2020-01-31 DIAGNOSIS — L578 Other skin changes due to chronic exposure to nonionizing radiation: Secondary | ICD-10-CM

## 2020-01-31 DIAGNOSIS — L821 Other seborrheic keratosis: Secondary | ICD-10-CM | POA: Diagnosis not present

## 2020-01-31 DIAGNOSIS — L820 Inflamed seborrheic keratosis: Secondary | ICD-10-CM | POA: Diagnosis not present

## 2020-01-31 DIAGNOSIS — D239 Other benign neoplasm of skin, unspecified: Secondary | ICD-10-CM

## 2020-01-31 DIAGNOSIS — Z1283 Encounter for screening for malignant neoplasm of skin: Secondary | ICD-10-CM

## 2020-01-31 DIAGNOSIS — L570 Actinic keratosis: Secondary | ICD-10-CM | POA: Diagnosis not present

## 2020-01-31 DIAGNOSIS — D1800 Hemangioma unspecified site: Secondary | ICD-10-CM

## 2020-01-31 DIAGNOSIS — Z85828 Personal history of other malignant neoplasm of skin: Secondary | ICD-10-CM

## 2020-01-31 HISTORY — DX: Other benign neoplasm of skin, unspecified: D23.9

## 2020-01-31 NOTE — Patient Instructions (Signed)

## 2020-01-31 NOTE — Progress Notes (Signed)
Follow-Up Visit   Subjective  Ethan Vega. is a 74 y.o. male who presents for the following: Annual Exam (UBSE - patient has noticed lesions on his forehead and temples that he would like checked). Hx of BCC.  He also wonders about bruising on his arms The patient presents for Upper Body Skin Exam (UBSE) for skin cancer screening and mole check.  The following portions of the chart were reviewed this encounter and updated as appropriate:  Tobacco  Allergies  Meds  Problems  Med Hx  Surg Hx  Fam Hx     Review of Systems:  No other skin or systemic complaints except as noted in HPI or Assessment and Plan.  Objective  Well appearing patient in no apparent distress; mood and affect are within normal limits.  All skin waist up examined.  Objective  L mid back paraspinal: 0.8 cm irregular brown macule   Objective  Face (17): Erythematous thin papules/macules with gritty scale.   Objective  Face x 1, back x 18 (19): Erythematous keratotic or waxy stuck-on papule or plaque.   Assessment & Plan  Neoplasm of uncertain behavior of skin L mid back paraspinal  Epidermal / dermal shaving  Lesion diameter (cm):  0.8 Informed consent: discussed and consent obtained   Timeout: patient name, date of birth, surgical site, and procedure verified   Procedure prep:  Patient was prepped and draped in usual sterile fashion Prep type:  Isopropyl alcohol Anesthesia: the lesion was anesthetized in a standard fashion   Anesthetic:  1% lidocaine w/ epinephrine 1-100,000 buffered w/ 8.4% NaHCO3 Instrument used: flexible razor blade   Hemostasis achieved with: pressure, aluminum chloride and electrodesiccation   Outcome: patient tolerated procedure well   Post-procedure details: sterile dressing applied and wound care instructions given   Dressing type: bandage and petrolatum    Specimen 1 - Surgical pathology Differential Diagnosis: D48.5 r/o dysplastic nevus  Check Margins:  No 0.8 cm irregular brown macule  AK (actinic keratosis) (17) Face  Destruction of lesion - Face Complexity: simple   Destruction method: cryotherapy   Informed consent: discussed and consent obtained   Timeout:  patient name, date of birth, surgical site, and procedure verified Lesion destroyed using liquid nitrogen: Yes   Region frozen until ice ball extended beyond lesion: Yes   Outcome: patient tolerated procedure well with no complications   Post-procedure details: wound care instructions given    Inflamed seborrheic keratosis (19) Face x 1, back x 18  Destruction of lesion - Face x 1, back x 18 Complexity: simple   Destruction method: cryotherapy   Informed consent: discussed and consent obtained   Timeout:  patient name, date of birth, surgical site, and procedure verified Lesion destroyed using liquid nitrogen: Yes   Region frozen until ice ball extended beyond lesion: Yes   Outcome: patient tolerated procedure well with no complications   Post-procedure details: wound care instructions given     Lentigines - Scattered tan macules - Discussed due to sun exposure - Benign, observe - Call for any changes  Seborrheic Keratoses - Stuck-on, waxy, tan-brown papules and plaques  - Discussed benign etiology and prognosis. - Observe - Call for any changes  Melanocytic Nevi - Tan-brown and/or pink-flesh-colored symmetric macules and papules - Benign appearing on exam today - Observation - Call clinic for new or changing moles - Recommend daily use of broad spectrum spf 30+ sunscreen to sun-exposed areas.   Hemangiomas - Red papules - Discussed benign  nature - Observe - Call for any changes  Actinic Damage - Chronic, secondary to cumulative UV/sun exposure - diffuse scaly erythematous macules with underlying dyspigmentation - Recommend daily broad spectrum sunscreen SPF 30+ to sun-exposed areas, reapply every 2 hours as needed.  - Call for new or changing  lesions.  History of Basal Cell Carcinoma of the Skin - No evidence of recurrence today - Recommend regular full body skin exams - Recommend daily broad spectrum sunscreen SPF 30+ to sun-exposed areas, reapply every 2 hours as needed.  - Call if any new or changing lesions are noted between office visits  Purpura - Chronic; persistent and recurrent.  Treatable, but not curable. - Violaceous macules and patches - Benign - Related to age, sun damage and/or use of blood thinners - Observe - Can use OTC arnica containing moisturizer such as Dermend Bruise Formula if desired - Call for worsening or other concerns  Skin cancer screening performed today.  Return in about 6 months (around 07/30/2020) for sun exposed areas - AKs, hx BCC .  Luther Redo, CMA, am acting as scribe for Sarina Ser, MD .  Documentation: I have reviewed the above documentation for accuracy and completeness, and I agree with the above.  Sarina Ser, MD

## 2020-02-01 ENCOUNTER — Encounter: Payer: Self-pay | Admitting: Dermatology

## 2020-02-03 ENCOUNTER — Telehealth: Payer: Self-pay

## 2020-02-03 NOTE — Telephone Encounter (Signed)
Patient informed of pathology results and surgery scheduled. 

## 2020-02-03 NOTE — Telephone Encounter (Signed)
-----   Message from Ralene Bathe, MD sent at 02/01/2020  6:21 PM EST ----- Diagnosis Skin , l mid back paraspinal DYSPLASTIC COMPOUND NEVUS WITH SEVERE ATYPIA, LATERAL AND DEEP MARGINS INVOLVED, SEE DESCRIPTION  Severe dysplastic Schedule surgery

## 2020-03-14 ENCOUNTER — Telehealth: Payer: Self-pay

## 2020-03-14 NOTE — Telephone Encounter (Signed)
-----   Message from Duke Salvia, MD sent at 03/14/2020 11:34 AM EST ----- Regarding: RE: Eliquis and surgery Good morning.  He has had a stroke so the minimal time is best.  Because the procedure is skin, if the risk of bleeding is a concern I would hold it for no more than 2 doses before and resume the morning after.  Thx Sk ----- Message ----- From: Mickle Mallory, CMA Sent: 03/14/2020  11:32 AM EST To: Duke Salvia, MD Subject: Eliquis and surgery                            Good Morning Dr. Graciela Husbands,   Ethan Vega is a mutual patient who is scheduled for excision of a severely dysplastic nevus on 03/21/20 at 8:30am here in our office. Should he stop the Eliquis? If so, when should he stop it and how long post procedure should he restart it? Thank you for your time.

## 2020-03-14 NOTE — Telephone Encounter (Signed)
Patient informed and verbalized understanding of Dr. Wilmon Arms recommendation to hold the Eliquis for two doses prior to the procedure, and restart one day post procedure.

## 2020-03-17 ENCOUNTER — Other Ambulatory Visit: Payer: Self-pay | Admitting: Internal Medicine

## 2020-03-21 ENCOUNTER — Encounter: Payer: Medicare Other | Admitting: Dermatology

## 2020-03-21 ENCOUNTER — Ambulatory Visit: Payer: Medicare Other | Admitting: Dermatology

## 2020-03-21 ENCOUNTER — Encounter: Payer: Self-pay | Admitting: Dermatology

## 2020-03-21 ENCOUNTER — Other Ambulatory Visit: Payer: Self-pay

## 2020-03-21 DIAGNOSIS — L578 Other skin changes due to chronic exposure to nonionizing radiation: Secondary | ICD-10-CM

## 2020-03-21 DIAGNOSIS — Z85828 Personal history of other malignant neoplasm of skin: Secondary | ICD-10-CM

## 2020-03-21 DIAGNOSIS — D225 Melanocytic nevi of trunk: Secondary | ICD-10-CM

## 2020-03-21 DIAGNOSIS — D239 Other benign neoplasm of skin, unspecified: Secondary | ICD-10-CM

## 2020-03-21 DIAGNOSIS — L820 Inflamed seborrheic keratosis: Secondary | ICD-10-CM

## 2020-03-21 MED ORDER — MUPIROCIN 2 % EX OINT: 1.0000 "application " | TOPICAL_OINTMENT | Freq: Every day | CUTANEOUS | 0 refills | Status: DC

## 2020-03-21 NOTE — Patient Instructions (Signed)

## 2020-03-21 NOTE — Progress Notes (Signed)
Follow-Up Visit   Subjective  Ethan Vega. is a 75 y.o. male who presents for the following: Procedure (Biopsy proven severe dysplastic nevus of left mid back paraspinal - Excise today). He also has other areas to be checked.  One on face is growing and irritating.  The following portions of the chart were reviewed this encounter and updated as appropriate:   Tobacco  Allergies  Meds  Problems  Med Hx  Surg Hx  Fam Hx     Review of Systems:  No other skin or systemic complaints except as noted in HPI or Assessment and Plan.  Objective  Well appearing patient in no apparent distress; mood and affect are within normal limits.  A focused examination was performed including back. Relevant physical exam findings are noted in the Assessment and Plan.  Objective  Left mid back paraspinal: Healing biopsy site  Objective  Left zygoma: Erythematous keratotic or waxy stuck-on papule or plaque.    Assessment & Plan  Dysplastic nevus Left mid back paraspinal  Skin excision - Left mid back paraspinal  Lesion length (cm):  1.5 Lesion width (cm):  1 Margin per side (cm):  0.2 Total excision diameter (cm):  1.9 Informed consent: discussed and consent obtained   Timeout: patient name, date of birth, surgical site, and procedure verified   Procedure prep:  Patient was prepped and draped in usual sterile fashion Prep type:  Isopropyl alcohol and povidone-iodine Anesthesia: the lesion was anesthetized in a standard fashion   Anesthetic:  1% lidocaine w/ epinephrine 1-100,000 buffered w/ 8.4% NaHCO3 Instrument used: #15 blade   Hemostasis achieved with: pressure   Hemostasis achieved with comment:  Electrocautery Outcome: patient tolerated procedure well with no complications   Post-procedure details: sterile dressing applied and wound care instructions given   Dressing type: bandage and pressure dressing (mupirocin)    Skin repair - Left mid back paraspinal Complexity:   Complex Final length (cm):  4 Reason for type of repair: reduce tension to allow closure, reduce the risk of dehiscence, infection, and necrosis, reduce subcutaneous dead space and avoid a hematoma, allow closure of the large defect, preserve normal anatomy, preserve normal anatomical and functional relationships and enhance both functionality and cosmetic results   Undermining: area extensively undermined   Undermining comment:  Undermining defect 1.5 cm Subcutaneous layers (deep stitches):  Suture size:  2-0 Suture type: Vicryl (polyglactin 910)   Subcutaneous suture technique: inverted dermal. Fine/surface layer approximation (top stitches):  Suture size:  2-0 Suture type: nylon   Stitches: horizontal mattress and simple running   Suture removal (days):  7 Hemostasis achieved with: suture and pressure Outcome: patient tolerated procedure well with no complications   Post-procedure details: sterile dressing applied and wound care instructions given   Dressing type: bandage and pressure dressing (mupirocin)    mupirocin ointment (BACTROBAN) 2 % - Left mid back paraspinal  Specimen 1 - Surgical pathology Differential Diagnosis: Biopsy proven severe dysplastic nevus Check Margins: No Healing biopsy site [phone]  Inflamed seborrheic keratosis Left zygoma  Destruction of lesion - Left zygoma Complexity: simple   Destruction method: cryotherapy   Informed consent: discussed and consent obtained   Timeout:  patient name, date of birth, surgical site, and procedure verified Lesion destroyed using liquid nitrogen: Yes   Region frozen until ice ball extended beyond lesion: Yes   Outcome: patient tolerated procedure well with no complications   Post-procedure details: wound care instructions given    Actinic Damage -  chronic, secondary to cumulative UV radiation exposure/sun exposure over time - diffuse scaly erythematous macules with underlying dyspigmentation - Recommend daily  broad spectrum sunscreen SPF 30+ to sun-exposed areas, reapply every 2 hours as needed.  - Call for new or changing lesions.  Return in about 1 week (around 03/28/2020) for suture removal.  I, Joanie Coddington, CMA, am acting as scribe for Armida Sans, MD .  Documentation: I have reviewed the above documentation for accuracy and completeness, and I agree with the above.  Armida Sans, MD

## 2020-03-23 ENCOUNTER — Ambulatory Visit: Payer: Medicare Other

## 2020-03-23 ENCOUNTER — Other Ambulatory Visit: Payer: Self-pay

## 2020-03-23 DIAGNOSIS — D239 Other benign neoplasm of skin, unspecified: Secondary | ICD-10-CM

## 2020-03-23 NOTE — Progress Notes (Signed)
   Follow-Up Visit   Subjective  Ethan Vega. is a 75 y.o. male who presents for the following: dressing change (Patient is here for a dressing change since he can't reach the excision site on his back. ).   The following portions of the chart were reviewed this encounter and updated as appropriate:      Review of Systems:  No other skin or systemic complaints except as noted in HPI or Assessment and Plan.  Objective  Well appearing patient in no apparent distress; mood and affect are within normal limits.  A focused examination was performed including the back. Relevant physical exam findings are noted in the Assessment and Plan.  Objective  L mid back paraspinal: Healing excision   Assessment & Plan  Dysplastic nevus L mid back paraspinal  S/P excision -   The wound was cleansed with Puracyn and Hibiclens. Mupirocin 2% ointment applied to aa, covered with a non-stick gauze, and retention tape.   Other Related Medications mupirocin ointment (BACTROBAN) 2 %  Return for appointment as scheduled.

## 2020-03-26 ENCOUNTER — Other Ambulatory Visit: Payer: Self-pay | Admitting: Internal Medicine

## 2020-03-28 ENCOUNTER — Ambulatory Visit: Payer: Medicare Other

## 2020-03-28 ENCOUNTER — Other Ambulatory Visit: Payer: Self-pay

## 2020-03-28 DIAGNOSIS — Z86018 Personal history of other benign neoplasm: Secondary | ICD-10-CM

## 2020-03-28 NOTE — Progress Notes (Unsigned)
   Follow-Up Visit   Subjective  Magnolia Antolin. is a 75 y.o. male who presents for the following: Suture / Staple Removal (Suture removal L mid back paraspinal biopsy proven Dysplastic nevus with severe Atypia excision 03-23-2020).   The following portions of the chart were reviewed this encounter and updated as appropriate:       Review of Systems:  No other skin or systemic complaints except as noted in HPI or Assessment and Plan.  Objective  Well appearing patient in no apparent distress; mood and affect are within normal limits.  A focused examination was performed including back . Relevant physical exam findings are noted in the Assessment and Plan.  Objective  Left mid back paraspinal: Pink scar    Assessment & Plan  History of dysplastic nevus Left mid back paraspinal  Biopsy proven Dysplastic nevus severe Atypia excision margins clear discussed   Encounter for Removal of Sutures - Incision site at the L mid back paraspinal  is clean, dry and intact - Wound cleansed, sutures removed, wound cleansed and steri strips applied.  - Discussed pathology results showing Dysplastic nevus with severe Atypia  - Patient advised to keep steri-strips dry until they fall off. - Scars remodel for a full year. - Once steri-strips fall off, patient can apply over-the-counter silicone scar cream each night to help with scar remodeling if desired. - Patient advised to call with any concerns or if they notice any new or changing lesions.   Return for keep scheduled appt May 2022 .  I, Marye Round, CMA, am acting as scribe for [employer] .

## 2020-04-06 ENCOUNTER — Other Ambulatory Visit: Payer: Self-pay | Admitting: Internal Medicine

## 2020-04-11 ENCOUNTER — Encounter: Payer: Medicare Other | Admitting: Dermatology

## 2020-04-11 ENCOUNTER — Ambulatory Visit (INDEPENDENT_AMBULATORY_CARE_PROVIDER_SITE_OTHER): Payer: Medicare Other

## 2020-04-11 DIAGNOSIS — I428 Other cardiomyopathies: Secondary | ICD-10-CM

## 2020-04-11 LAB — CUP PACEART REMOTE DEVICE CHECK
Battery Remaining Longevity: 32 mo
Battery Remaining Percentage: 36 %
Battery Voltage: 2.87 V
Brady Statistic AP VP Percent: 1 %
Brady Statistic AP VS Percent: 98 %
Brady Statistic AS VP Percent: 1 %
Brady Statistic AS VS Percent: 1.3 %
Brady Statistic RA Percent Paced: 96 %
Brady Statistic RV Percent Paced: 1 %
Date Time Interrogation Session: 20220125040018
HighPow Impedance: 71 Ohm
HighPow Impedance: 71 Ohm
Implantable Lead Implant Date: 20160114
Implantable Lead Implant Date: 20160114
Implantable Lead Location: 753859
Implantable Lead Location: 753860
Implantable Lead Model: 7122
Implantable Pulse Generator Implant Date: 20160114
Lead Channel Impedance Value: 340 Ohm
Lead Channel Impedance Value: 360 Ohm
Lead Channel Pacing Threshold Amplitude: 0.625 V
Lead Channel Pacing Threshold Amplitude: 1.25 V
Lead Channel Pacing Threshold Pulse Width: 0.5 ms
Lead Channel Pacing Threshold Pulse Width: 0.6 ms
Lead Channel Sensing Intrinsic Amplitude: 2.5 mV
Lead Channel Sensing Intrinsic Amplitude: 9.6 mV
Lead Channel Setting Pacing Amplitude: 1.5 V
Lead Channel Setting Pacing Amplitude: 1.625
Lead Channel Setting Pacing Pulse Width: 0.6 ms
Lead Channel Setting Sensing Sensitivity: 0.5 mV
Pulse Gen Serial Number: 7241695

## 2020-04-22 NOTE — Progress Notes (Signed)
Remote ICD transmission.   

## 2020-04-28 ENCOUNTER — Other Ambulatory Visit: Payer: Self-pay | Admitting: Internal Medicine

## 2020-04-28 NOTE — Telephone Encounter (Signed)
Prescription refill request for Eliquis received.  Indication: Afib Last office visit: Ethan Vega 11/23/2019 Scr: 1.00, 11/23/2019 Age: 75 yo  Weight: 110.9 kg   Prescription refill sent.

## 2020-04-28 NOTE — Telephone Encounter (Signed)
Refill request

## 2020-05-23 ENCOUNTER — Ambulatory Visit (INDEPENDENT_AMBULATORY_CARE_PROVIDER_SITE_OTHER): Payer: Medicare Other | Admitting: Internal Medicine

## 2020-05-23 ENCOUNTER — Other Ambulatory Visit: Payer: Self-pay

## 2020-05-23 ENCOUNTER — Encounter: Payer: Self-pay | Admitting: Internal Medicine

## 2020-05-23 VITALS — BP 114/70 | HR 61 | Ht 66.0 in | Wt 245.0 lb

## 2020-05-23 DIAGNOSIS — I5042 Chronic combined systolic (congestive) and diastolic (congestive) heart failure: Secondary | ICD-10-CM | POA: Diagnosis not present

## 2020-05-23 DIAGNOSIS — I472 Ventricular tachycardia, unspecified: Secondary | ICD-10-CM

## 2020-05-23 DIAGNOSIS — I4811 Longstanding persistent atrial fibrillation: Secondary | ICD-10-CM

## 2020-05-23 DIAGNOSIS — Z95810 Presence of automatic (implantable) cardiac defibrillator: Secondary | ICD-10-CM

## 2020-05-23 DIAGNOSIS — I428 Other cardiomyopathies: Secondary | ICD-10-CM

## 2020-05-23 DIAGNOSIS — I469 Cardiac arrest, cause unspecified: Secondary | ICD-10-CM | POA: Diagnosis not present

## 2020-05-23 DIAGNOSIS — Z79899 Other long term (current) drug therapy: Secondary | ICD-10-CM

## 2020-05-23 MED ORDER — SPIRONOLACTONE 25 MG PO TABS: 25.0000 mg | ORAL_TABLET | Freq: Every day | ORAL | 6 refills | Status: DC

## 2020-05-23 NOTE — Progress Notes (Signed)
Patient Care Team: Sherron Monday, MD as PCP - General (Internal Medicine) Duke Salvia, MD as PCP - Cardiology (Cardiology)   HPI  Ethan Vega Ethan Vega. is a 75 y.o. 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   Now on sotalol.  Flecainide challenge was negative ECG  Low Volts R-V1 with RAD with mild RA enlargement SAECG not done as he was in AFib RVR and rate related RBBB  He has a history of atrial fibrillation anticoagulation w Apixoban    History of syncopal ventricular tachycardia of 12/18   The patient denies chest pain,  orthopnea and trace peripheral edema .  There have been no palpitations, lightheadedness or syncope.  Dyspnea on exertion.  Heart rate excursion on device is reasonable Some nocturnal dyspnea   Struggling with obesity      TERF  Stroke-2, age 3, HTN-1 DM -1 for CHADSVAS score >= 5                 Date Cr K Hgb  Mg  1/17       8/17  0.82  14.4   5/18 0.87  13.2   12/18 1.43 4.0 14.3   5/19 0.83 4.0 14.2   3/20 1.09 3.7 13.4   5/20 1.10 4.3  2.3  10/20 0.94 3.9    3/21 1.14 4.1 13.4 2.0  9/21 1.00 4.2 13.1 2.3       DATE TEST EF   1/16 Cath  30-35%  CA normal   4/16  Echo   45-50 %   5/16 myoview    prior infarct?? No ischemia  2/18 ECHO  40-45 %   7/20 TPP scan  Unlikely TTR amyloid  7/20 Echo  35-40% PA press est 35     Antiarrhythmics Date Reason stopped  amiodarone  /  Optic neuritis  Sotalol  4/20         Past Medical History:  Diagnosis Date  . Actinic keratosis   . AICD (automatic cardioverter/defibrillator) present 03/31/14   a. 03/2014 s/p SJM 2411-36C dual chamber AICD (serial Number [id number])-  followed by Dr. Graciela Husbands  . AICD (automatic cardioverter/defibrillator) present   . Arthritis    a. L knee.  . Asthma   . Atrial fibrillation (HCC)   . Basal cell carcinoma 08/29/2008   Vertex scalp. Keratotic pattern, ulcerated.  . Basal cell carcinoma 01/15/2018   Left distal medial thigh near knee. Fibroepithelioma of pinkus type  . Basal cell carcinoma 01/15/2018   Right distal lat. nose ant. inferior edge. Nodular pattern.  . Cancer (HCC)    Melanoma resected from scalp  . Cardiac arrest [facility])    a. 03/30/2014 VF arrest in setting of NICM >> CPR/defib in community >> s/p dual chamber AICD  . Cardiac arrest (HCC)   . Coronary artery disease, non-occlusive    a. cath 03/2014 at [facility]: no sig CAD (OM1 30%, CFX 30%), EF 35%  . HLD (hyperlipidemia)   . HTN (hypertension)   . Kidney stones   . NICM (nonischemic cardiomyopathy) (HCC)    a. 03/2014 Echo:  Inferolateral and lateral HK, EF 50-55%, grade 1 diastolic dysfunction, mild MR, mild LAE, mild RAE,  trivial TR, no effusion.  . Paroxysmal atrial fibrillation (HCC) 03/28/2015  . Retinal artery occlusion     Past Surgical History:  Procedure Laterality Date  . adenomatous polyps    . CARDIAC CATHETERIZATION  03/30/2014  . CARDIOVERSION  03/30/2014  . COLONOSCOPY WITH PROPOFOL N/A 03/24/2017   Procedure: COLONOSCOPY WITH PROPOFOL;  Surgeon: Christena Deem, MD;  Location: [facility] ENDOSCOPY;  Service: Endoscopy;  Laterality: N/A;  . COLONOSCOPY WITH PROPOFOL N/A 01/07/2019   Procedure: COLONOSCOPY WITH PROPOFOL;  Surgeon: Toledo, Boykin Nearing, MD;  Location: [facility] ENDOSCOPY;  Service: Gastroenterology;  Laterality: N/A;  . CYSTOSCOPY W/ LITHOLAPAXY / EHL    . IMPLANTABLE CARDIOVERTER DEFIBRILLATOR IMPLANT N/A 03/31/2014   Procedure: IMPLANTABLE CARDIOVERTER DEFIBRILLATOR IMPLANT;  Surgeon: Marinus Maw, MD;  Location: [facility] CATH LAB;  Service: Cardiovascular;  Laterality: N/A;  . JOINT REPLACEMENT    . TONSILLECTOMY     "as a kid"  . TOTAL KNEE  ARTHROPLASTY Left ~ 2010  . TOTAL KNEE ARTHROPLASTY WITH REVISION COMPONENTS Left ~ 2011  . VARICOSE VEIN SURGERY Left ~ 2014    Current Outpatient Medications  Medication Sig Dispense Refill  . atorvastatin (LIPITOR) 10 MG tablet Take 1 tablet (10 mg total) by mouth daily. 90 tablet 3  . carvedilol (COREG) 25 MG tablet Take 1 tablet (25 mg total) by mouth 2 (two) times daily with a meal. Pt needs to keep upcoming appt in Mar for further refills 180 tablet 2  . ELIQUIS 5 MG TABS tablet TAKE 1 TABLET BY MOUTH TWICE A DAY 180 tablet 1  . Fluticasone-Salmeterol (ADVAIR) 250-50 MCG/DOSE AEPB Inhale 1 puff into the lungs 2 (two) times daily.    . furosemide (LASIX) 40 MG tablet Take 1 tablet (40 mg) by mouth twice a day 2 days a week, and take 1 tablet (40 mg) once daily all other days 135 tablet 2  . levothyroxine (SYNTHROID, LEVOTHROID) 75 MCG tablet Take 75 mcg by mouth daily.  2  . losartan (COZAAR) 100 MG tablet TAKE 1 TABLET BY MOUTH EVERY DAY 90 tablet 1  . potassium chloride SA (KLOR-CON M20) 20 MEQ tablet TAKE 1 TABLET BY MOUTH EVERY DAY 90 tablet 3  . sotalol (BETAPACE) 80 MG tablet TAKE 1 TABLET (80 MG TOTAL) BY MOUTH 2 (TWO) TIMES DAILY. 180 tablet 0   No current facility-administered medications for this visit.   Facility-Administered Medications Ordered in Other Visits  Medication Dose Route Frequency Provider Last Rate Last Admin  . technetium tetrofosmin (TC-MYOVIEW) injection 23.3 millicurie  23.3 millicurie Intravenous Once PRN Pricilla Riffle, MD        No Known Allergies  Review of Systems negative except from HPI and PMH  Physical Exam  BP 114/70 (BP Location: Left Arm, Patient Position: Sitting, Cuff Size: Normal)   Pulse 61   Ht 5\' 6"  (1.676 m)   Wt 245 lb (111.1 kg)   SpO2 96%   BMI 39.54 kg/m  Well developed and well nourished in no acute distress HENT normal Neck suppl  Clear Device pocket well healed; without hematoma or erythema.  There is no tethering   Regular rate and rhythm, no murmur Abd-soft with active BS No Clubbing cyanosis tr edema Skin-warm and dry A & Oriented  Grossly normal sensory and motor function  ECG atrial pacing at 61 Interval 22/09/43 Left axis deviation with an RSR prime Low voltage  Assessment and  Plan  Aborted Cardiac arrest//FHx Cardiac Arrest   Nonischemic cardiomyopathy  ICD  St Jude       Hypertension  Ventricular tachycardia sustained/syncope  High Risk Medication Surveillance-sotalol  Sinus bradycardia/chronotropic incompetence  Low Voltage ECG  iRBBB LAFB  Atrial Fibrillation persistent   Congestive heart failure- acute/ chronic-systolic/diastolic  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  No interval ventricular arrhythmias continue sotalol surveillance laboratories were normal 9/21.  We will recheck him again in 2 weeks (see below)  With his heart failure and his cardiomyopathy, we will begin to undertake a more aggressive trajectory towards guideline directed therapy.  First we will begin spironolactone 25 mg.  Discontinue potassium supplementation.  Discussed side effects including gynecomastia.  We will check blood work as above.  In about 2 months, we will have him come back to cardiology clinic for transition from [facility] and then a couple of months after that would ask that we, if stable, add SGLT2.  Blood pressure well controlled  Encouraged weight loss  No significant bleeding on anticoagulation

## 2020-05-23 NOTE — Patient Instructions (Signed)
Medication Instructions:  - Your physician has recommended you make the following change in your medication:   1) STOP potassium  2) START aldactone (spironolactone) 25 mg- take 1 tablet by mouth once daily  *If you need a refill on your cardiac medications before your next appointment, please call your pharmacy*   Lab Work: - Your physician recommends that you return for lab work in: 2 weeks (around 06/06/20) - BMP/ CBC/ [facility] Entrance at [facility] 1st desk on the right to check in, past the screening table Lab hours: Monday- Friday (7:30 am- 5:30 pm)   If you have labs (blood work) drawn today and your tests are completely normal, you will receive your results only by: Marland Kitchen MyChart Message (if you have MyChart) OR . A paper copy in the mail If you have any lab test that is abnormal or we need to change your treatment, we will call you to review the results.   Testing/Procedures: - none ordered   Follow-Up: At [facility], you and your health needs are our priority.  As part of our continuing mission to provide you with exceptional heart care, we have created designated Provider Care Teams.  These Care Teams include your primary Cardiologist (physician) and Advanced Practice Providers (APPs -  Physician Assistants and Nurse Practitioners) who all work together to provide you with the care you need, when you need it.  We recommend signing up for the patient portal called "MyChart".  Sign up information is provided on this After Visit Summary.  MyChart is used to connect with patients for Virtual Visits (Telemedicine).  Patients are able to view lab/test results, encounter notes, upcoming appointments, etc.  Non-urgent messages can be sent to your provider as well.   To learn more about what you can do with MyChart, go to [email or web address].    Your next appointment:   1) in 2 months with an APP   2) in 6 months with Dr. Caryl Comes  The format for your next  appointment:   In Person  Provider:   As above   Other Instructions  Spironolactone Oral Tablets What is this medicine? SPIRONOLACTONE (speer on oh LAK tone) is a diuretic. It helps you make more urine and to lose salt and excess water from your body. It treats swelling from heart, kidney, or liver disease. It treats high blood pressure. It also treats high aldosterone levels in the blood. This medicine may be used for other purposes; ask your health care provider or pharmacist if you have questions. COMMON BRAND NAME(S): Aldactone What should I tell my health care provider before I take this medicine? They need to know if you have any of these conditions:  Addison's disease or low adrenal gland function  high blood level of potassium  kidney disease  liver disease  an unusual or allergic reaction to spironolactone, other medicines, foods, dyes, or preservatives  pregnant or trying to get pregnant  breast-feeding How should I use this medicine? Take this medicine by mouth. Take it as directed on the prescription label at the same time every day. You can take it with or without food. You should always take it the same way. Keep taking it unless your health care provider tells you to stop. Talk to your health care provider about the use of this drug in children. Special care may be needed. Overdosage: If you think you have taken too much of this medicine contact a poison control center or emergency room at once.  NOTE: This medicine is only for you. Do not share this medicine with others. What if I miss a dose? If you miss a dose, take it as soon as you can. If it is almost time for your next dose, take only that dose. Do not take double or extra doses. What may interact with this medicine? Do not take this medicine with any of the following medications:  cidofovir  eplerenone  tranylcypromine This medicine may also interact with the following  medications:  aspirin  certain medicines for blood pressure or heart disease like benazepril, lisinopril, losartan, valsartan  certain medicines that treat or prevent blood clots like heparin and enoxaparin  cholestyramine  cyclosporine  digoxin  lithium  medicines that relax muscles for surgery  NSAIDs, medicines for pain and inflammation, like ibuprofen or naproxen  other diuretics  potassium salts or supplements  steroid medicines like prednisone or cortisone  trimethoprim This list may not describe all possible interactions. Give your health care provider a list of all the medicines, herbs, non-prescription drugs, or dietary supplements you use. Also tell them if you smoke, drink alcohol, or use illegal drugs. Some items may interact with your medicine. What should I watch for while using this medicine? Visit your doctor or health care provider for regular checks on your progress. Check your blood pressure as directed. Ask your health care provider what your blood pressure should be. Also, find out when you should contact him or her. Do not treat yourself for coughs, colds, or pain while you are using this medicine without asking your health care provider for advice. Some medicines may increase your blood pressure. Check with your health care provider if you have severe diarrhea, nausea, and vomiting, or if you sweat a lot. The loss of too much body fluid may make it dangerous for you to take this medicine. You may need to be on a special diet while taking this medicine. Ask your health care provider. Also, find out how many glasses of fluid you need to drink each day. You may get drowsy or dizzy. Do not drive, use machinery, or do anything that needs mental alertness until you know how this medicine affects you. Do not stand or sit up quickly, especially if you are an older patient. This reduces the risk of dizzy or fainting spells. Alcohol may interfere with the effects of this  medicine. Avoid alcoholic drinks. Avoid salt substitutes unless you are told otherwise by your health care provider. What side effects may I notice from receiving this medicine? Side effects that you should report to your health care provider as soon as possible:  allergic reactions (skin rash, itching or hives, swelling of the face, lips, or tongue)  breast enlargement in both males and females  changes in menstrual cycle  gout (severe pain, redness, or swelling in joints like the big toe)  high blood sugar (increased hunger, thirst or urination; unusually weak or tired, blurry vision)  high potassium levels (chest pain; fast irregular heartbeat; muscle weakness)  kidney injury (trouble passing urine or change in the amount of urine)  low blood pressure (dizziness; feeling faint or lightheaded, falls; unusually weak or tired)  low calcium levels (fast heartbeat; muscle cramps or pain; pain, tingling, or numbness in the hands or feet; seizures)  low magnesium levels (fast, irregular heartbeat; feeling faint or lightheaded, falls; muscle cramps or pain) Side effects that usually do not require medical attention (report to your health care provider if they continue or  are bothersome):  changes in sex drive or performance  dizziness  headache  upset stomach This list may not describe all possible side effects. Call your doctor for medical advice about side effects. You may report side effects to FDA at 1-800-FDA-1088. Where should I keep my medicine? Keep out of the reach of children and pets. Store below 25 degrees C (77 degrees F). Get rid of any unused medicine after the expiration date. To get rid of medicines that are no longer needed or have expired:  Take the medicine to a medicine take-back program. Check with your pharmacy or law enforcement to find a location.  If you cannot return the medicine, check the label or package insert to see if the medicine should be thrown  out in the garbage or flushed down the toilet. If you are not sure, ask your health care provider. If it is safe to put into the trash, take the medicine out of the container. Mix the medicine with cat litter, dirt, coffee grounds, or other unwanted substance. Seal the mixture in a bag or container. Put it in the trash. NOTE: This sheet is a summary. It may not cover all possible information. If you have questions about this medicine, talk to your doctor, pharmacist, or health care provider.  2021 Elsevier/Gold Standard (2019-05-21 19:57:34)

## 2020-06-06 ENCOUNTER — Other Ambulatory Visit
Admission: RE | Admit: 2020-06-06 | Discharge: 2020-06-06 | Disposition: A | Discharge status: Home / Self Care | Payer: Medicare Other | Attending: Internal Medicine | Admitting: Internal Medicine

## 2020-06-06 DIAGNOSIS — Z79899 Other long term (current) drug therapy: Secondary | ICD-10-CM | POA: Diagnosis present

## 2020-06-06 DIAGNOSIS — I472 Ventricular tachycardia, unspecified: Secondary | ICD-10-CM

## 2020-06-06 DIAGNOSIS — I5042 Chronic combined systolic (congestive) and diastolic (congestive) heart failure: Secondary | ICD-10-CM | POA: Diagnosis present

## 2020-06-06 DIAGNOSIS — I428 Other cardiomyopathies: Secondary | ICD-10-CM | POA: Diagnosis present

## 2020-06-06 DIAGNOSIS — I4811 Longstanding persistent atrial fibrillation: Secondary | ICD-10-CM | POA: Diagnosis present

## 2020-06-06 LAB — CBC WITH DIFFERENTIAL/PLATELET
Abs Immature Granulocytes: 0.01 10*3/uL (ref 0.00–0.07)
Basophils Absolute: 0.1 10*3/uL (ref 0.0–0.1)
Basophils Relative: 1 %
Eosinophils Absolute: 0.3 10*3/uL (ref 0.0–0.5)
Eosinophils Relative: 5 %
HCT: 40.7 % (ref 39.0–52.0)
Hemoglobin: 13.3 g/dL (ref 13.0–17.0)
Immature Granulocytes: 0 %
Lymphocytes Relative: 23 %
Lymphs Abs: 1.3 10*3/uL (ref 0.7–4.0)
MCH: 30.9 pg (ref 26.0–34.0)
MCHC: 32.7 g/dL (ref 30.0–36.0)
MCV: 94.7 fL (ref 80.0–100.0)
Monocytes Absolute: 0.5 10*3/uL (ref 0.1–1.0)
Monocytes Relative: 9 %
Neutro Abs: 3.5 10*3/uL (ref 1.7–7.7)
Neutrophils Relative %: 62 %
Platelets: 208 10*3/uL (ref 150–400)
RBC: 4.3 MIL/uL (ref 4.22–5.81)
RDW: 13.2 % (ref 11.5–15.5)
WBC: 5.5 10*3/uL (ref 4.0–10.5)
nRBC: 0 % (ref 0.0–0.2)

## 2020-06-06 LAB — BASIC METABOLIC PANEL
Anion gap: 8 (ref 5–15)
BUN: 22 mg/dL (ref 8–23)
CO2: 26 mmol/L (ref 22–32)
Calcium: 9.2 mg/dL (ref 8.9–10.3)
Chloride: 105 mmol/L (ref 98–111)
Creatinine, Ser: 1.03 mg/dL (ref 0.61–1.24)
GFR, Estimated: >60 mL/min (ref >60)
Glucose, Bld: 111 mg/dL — ABNORMAL HIGH (ref 70–99)
Potassium: 4.2 mmol/L (ref 3.5–5.1)
Sodium: 139 mmol/L (ref 135–145)

## 2020-06-06 LAB — MAGNESIUM: Magnesium: 2.2 mg/dL (ref 1.7–2.4)

## 2020-06-17 ENCOUNTER — Other Ambulatory Visit: Payer: Self-pay | Admitting: Nurse Practitioner

## 2020-07-11 ENCOUNTER — Ambulatory Visit (INDEPENDENT_AMBULATORY_CARE_PROVIDER_SITE_OTHER): Payer: Medicare Other

## 2020-07-11 DIAGNOSIS — I428 Other cardiomyopathies: Secondary | ICD-10-CM | POA: Diagnosis not present

## 2020-07-11 LAB — CUP PACEART REMOTE DEVICE CHECK
Battery Remaining Longevity: 30 mo
Battery Remaining Percentage: 33 %
Battery Voltage: 2.86 V
Brady Statistic AP VP Percent: 1 %
Brady Statistic AP VS Percent: 98 %
Brady Statistic AS VP Percent: 1 %
Brady Statistic AS VS Percent: 1 %
Brady Statistic RA Percent Paced: 97 %
Brady Statistic RV Percent Paced: 1 %
Date Time Interrogation Session: 20220426040019
HighPow Impedance: 72 Ohm
HighPow Impedance: 72 Ohm
Implantable Lead Implant Date: 20160114
Implantable Lead Implant Date: 20160114
Implantable Lead Location: 753859
Implantable Lead Location: 753860
Implantable Lead Model: 7122
Implantable Pulse Generator Implant Date: 20160114
Lead Channel Impedance Value: 350 Ohm
Lead Channel Impedance Value: 360 Ohm
Lead Channel Pacing Threshold Amplitude: 0.5 V
Lead Channel Pacing Threshold Amplitude: 1 V
Lead Channel Pacing Threshold Pulse Width: 0.5 ms
Lead Channel Pacing Threshold Pulse Width: 0.6 ms
Lead Channel Sensing Intrinsic Amplitude: 2.4 mV
Lead Channel Sensing Intrinsic Amplitude: 7.4 mV
Lead Channel Setting Pacing Amplitude: 1.25 V
Lead Channel Setting Pacing Amplitude: 1.5 V
Lead Channel Setting Pacing Pulse Width: 0.6 ms
Lead Channel Setting Sensing Sensitivity: 0.5 mV
Pulse Gen Serial Number: 7241695

## 2020-07-24 ENCOUNTER — Ambulatory Visit: Payer: Medicare Other | Admitting: Physician Assistant

## 2020-07-24 ENCOUNTER — Other Ambulatory Visit: Payer: Self-pay

## 2020-07-24 ENCOUNTER — Encounter: Payer: Self-pay | Admitting: Physician Assistant

## 2020-07-24 VITALS — BP 102/68 | HR 64 | Ht 66.0 in | Wt 244.0 lb

## 2020-07-24 DIAGNOSIS — I480 Paroxysmal atrial fibrillation: Secondary | ICD-10-CM | POA: Diagnosis not present

## 2020-07-24 DIAGNOSIS — Z79899 Other long term (current) drug therapy: Secondary | ICD-10-CM

## 2020-07-24 DIAGNOSIS — I10 Essential (primary) hypertension: Secondary | ICD-10-CM

## 2020-07-24 DIAGNOSIS — I428 Other cardiomyopathies: Secondary | ICD-10-CM

## 2020-07-24 MED ORDER — ENTRESTO 24-26 MG PO TABS: 1.0000 | ORAL_TABLET | Freq: Two times a day (BID) | ORAL | 1 refills | Status: DC

## 2020-07-24 NOTE — Progress Notes (Signed)
Office Visit    Patient Name: Ethan Vega. Date of Encounter: 07/25/2020  PCP:  Jodi Marble, MD   [place]  Cardiologist:  Virl Axe, MD  Advanced Practice Provider:  No care team member to display Electrophysiologist:  None   Chief Complaint    Chief Complaint  Patient presents with  . Follow-up    2 Months follow up. Medications verbally reviewed with patient.     75 y.o. 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  Past Medical History    Past Medical History:  Diagnosis Date  . Actinic keratosis   . AICD (automatic cardioverter/defibrillator) present 03/31/14   a. 03/2014 s/p SJM 2411-36C dual chamber AICD (serial Number [id number])- followed by Dr. Caryl Comes  . AICD (automatic cardioverter/defibrillator) present   . Arthritis    a. L knee.  . Asthma   . Atrial fibrillation ([place])   . Basal cell carcinoma 08/29/2008   Vertex scalp. Keratotic pattern, ulcerated.  . Basal cell carcinoma 01/15/2018   Left distal medial thigh near knee. Fibroepithelioma of pinkus type  . Basal cell carcinoma 01/15/2018   Right distal lat. nose ant. inferior edge. Nodular pattern.  . Cancer ([place])    Melanoma resected from scalp  . Cardiac arrest [facility])    a. 03/30/2014 VF arrest in setting of NICM >> CPR/defib in community >> s/p dual chamber AICD  . Cardiac arrest ([place])   . Coronary artery disease, non-occlusive    a. cath 03/2014 at [facility]: no sig CAD (OM1 30%, CFX 30%), EF 35%  . Dysplastic nevus 01/31/2020   L mid back paraspinal - severe, excision  03/21/2020  . HLD (hyperlipidemia)   . HTN (hypertension)   . Kidney stones   . NICM (nonischemic cardiomyopathy) ([place])    a. 03/2014 Echo:  Inferolateral and lateral HK, EF 99991111, grade 1 diastolic dysfunction, mild MR, mild LAE, mild RAE, trivial TR, no effusion.  . Paroxysmal atrial fibrillation ([place]) 03/28/2015  . Retinal artery occlusion    Past Surgical History:  Procedure Laterality Date  . adenomatous polyps    . CARDIAC CATHETERIZATION  03/30/2014  . CARDIOVERSION  03/30/2014  . COLONOSCOPY WITH PROPOFOL N/A 03/24/2017   Procedure: COLONOSCOPY WITH PROPOFOL;  Surgeon: Lollie Sails, MD;  Location: [facility] ENDOSCOPY;  Service: Endoscopy;  Laterality: N/A;  . COLONOSCOPY WITH PROPOFOL N/A 01/07/2019   Procedure: COLONOSCOPY WITH PROPOFOL;  Surgeon: Toledo, [name], MD;  Location: [facility] ENDOSCOPY;  Service: Gastroenterology;  Laterality: N/A;  . CYSTOSCOPY W/ LITHOLAPAXY / EHL    . IMPLANTABLE CARDIOVERTER DEFIBRILLATOR IMPLANT N/A 03/31/2014   Procedure: IMPLANTABLE CARDIOVERTER DEFIBRILLATOR IMPLANT;  Surgeon: Evans Lance, MD;  Location: [facility] CATH LAB;  Service: Cardiovascular;  Laterality: N/A;  . JOINT REPLACEMENT    . TONSILLECTOMY     "as a kid"  . TOTAL KNEE ARTHROPLASTY Left ~ 2010  . TOTAL KNEE ARTHROPLASTY WITH REVISION COMPONENTS Left ~ 2011  . VARICOSE VEIN SURGERY Left ~ 2014    Allergies  No Known Allergies  History of Present Illness    Ethan Vega. is a 75 y.o. male with PMH as above.  He has history of ICD implantation 1/16 following aborted cardiac arrest occurring in the context of nonischemic cardiomyopathy with abnormal thallium scan, suggesting sarcoidosis.  He has a history of recurrent VT and syncopal VT.  This prompted up titration of his amiodarone.  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  He was then started on sotalol.  Flecainide challenge was negative.  EKG  at his previous visit was significant for low voltage and right axis deviation with mild RA enlargement.  He has history of atrial fibrillation on anticoagulation with apixaban.    At his last visit, he denied any chest pain, orthopnea.  He reported trace peripheral edema.  Heart rate on his device was reasonable.  He reported some nocturnal dyspnea.  He was struggling with obesity.  Recommendation was for medical management with a more aggressive trajectory toward guideline directed therapy.  He was started on spironolactone 25 mg daily.  Potassium supplementation discontinued.  In approximately 2 months, recommendation was for repeat visit to transition from losartan to [facility] and if stable add SGLT2 inhibitor.  Weight loss was encouraged.  Today, 07/24/2020, he presents to clinic and notes that he is overall doing well from a cardiac standpoint.  He denies any chest pain, tachypalpitations, presyncope, syncope.  No shortness of breath at rest.  He does report some dyspnea, attributed to weight and deconditioning.  He denies further PND.  No other sx of overload reported. He continues to struggle with his weight and weight loss.  He notes the 1 pound weight loss from his previous clinic visit and expresses some disappointment that he has not lost more weight.  He is tolerating his spironolactone 25 mg daily well and did note increased urination on this medication.  As directed, he is not taking his potassium supplement.  He is agreeable to recheck of labs today.  We discussed escalation of GDMT with Entresto today; however, BP soft today, which we discussed as a limiting factor for escalation of GDMT.  He does deny any associated symptoms with lower BP with recommendations as below.  Home Medications   Current Outpatient Medications  Medication Instructions  . atorvastatin (LIPITOR) 10 mg, Oral, Daily  . carvedilol (COREG) 25 mg, Oral, 2 times daily with meals, Pt needs to keep upcoming appt in Mar for  further refills  . ELIQUIS 5 MG TABS tablet TAKE 1 TABLET BY MOUTH TWICE A DAY  . fluticasone-salmeterol (WIXELA INHUB) 250-50 MCG/ACT AEPB 1 puff, Inhalation, 2 times daily  . furosemide (LASIX) 40 MG tablet Take 1 tablet (40 mg) by mouth twice a day 2 days a week, and take 1 tablet (40 mg) once daily all other days  . levothyroxine (SYNTHROID) 75 mcg, Oral, Daily  . sacubitril-valsartan (ENTRESTO) 24-26 MG 1 tablet, Oral, 2 times daily  . sotalol (BETAPACE) 80 MG tablet TAKE 1 TABLET BY MOUTH 2 TIMES DAILY.  Marland Kitchen spironolactone (ALDACTONE) 25 mg, Oral, Daily     Review of Systems    He denies chest pain, palpitations, dyspnea, pnd, orthopnea, n, v, dizziness, syncope, edema, weight gain, or early satiety.  He denies dizziness with lower BP.  He reports increased urination on spironolactone and improvement in symptoms.  He reports 1 pound of weight loss..   All other systems reviewed and are otherwise negative except as noted above.  Physical Exam    VS:  BP 102/68   Pulse 64   Ht 5\' 6"  (  1.676 m)   Wt 244 lb (110.7 kg)   SpO2 98%   BMI 39.38 kg/m  , BMI Body mass index is 39.38 kg/m. GEN: Well nourished, well developed, in no acute distress. HEENT: normal. Neck: Supple no carotid bruits, or masses.  JVD difficult to assess due to body habitus. Cardiac: RRR, no murmurs, rubs, or gallops. No clubbing, cyanosis, edema.  Radials/DP/PT 2+ and equal bilaterally.  Device pocket not TTP and without hematoma or erythema. Respiratory:  Respirations regular and unlabored, clear to auscultation bilaterally. GI: Soft, nontender, nondistended, BS + x 4. MS: no deformity or atrophy. Skin: warm and dry, no rash. Neuro:  Strength and sensation are intact. Psych: Normal affect.  Accessory Clinical Findings    ECG personally reviewed by me today - atrial paced rhythm at 64bpm, LAD, RBBB, LAFB, low voltage, poor R wave progression [id number], QRS 73ms, QTc 4101ms, PRT 49/83/46- no acute changes.  VITALS  Reviewed today   Temp Readings from Last 3 Encounters:  01/07/19 97.6 F (36.4 C)  07/30/17 98.2 F (36.8 C) (Oral)  03/24/17 (!) 96.5 F (35.8 C) (Tympanic)   BP Readings from Last 3 Encounters:  07/24/20 102/68  05/23/20 114/70  11/23/19 124/74   Pulse Readings from Last 3 Encounters:  07/24/20 64  05/23/20 61  11/23/19 67    Wt Readings from Last 3 Encounters:  07/24/20 244 lb (110.7 kg)  05/23/20 245 lb (111.1 kg)  11/23/19 244 lb 8 oz (110.9 kg)     LABS  reviewed today   Lab Results  Component Value Date   WBC 5.5 06/06/2020   HGB 13.3 06/06/2020   HCT 40.7 06/06/2020   MCV 94.7 06/06/2020   PLT 208 06/06/2020   Lab Results  Component Value Date   CREATININE 1.01 07/24/2020   BUN 13 07/24/2020   NA 143 07/24/2020   K 4.0 07/24/2020   CL 105 07/24/2020   CO2 22 07/24/2020   Lab Results  Component Value Date   ALT 35 08/14/2017   AST 32 08/14/2017   ALKPHOS 78 08/14/2017   BILITOT 2.7 (H) 08/14/2017   No results found for: CHOL, HDL, LDLCALC, LDLDIRECT, TRIG, CHOLHDL  Lab Results  Component Value Date   HGBA1C 5.5 03/31/2014   Lab Results  Component Value Date   TSH 2.890 04/28/2018     STUDIES/PROCEDURES reviewed today    Information reviewed from primary cardiologist note as copied and pasted directly below:                                                                                                                                                                                Date Cr K  Hgb  Mg  1/17       8/17  0.82  14.4   5/18 0.87  13.2   12/18 1.43 4.0 14.3   5/19 0.83 4.0 14.2   3/20 1.09 3.7 13.4   5/20 1.10 4.3  2.3  10/20 0.94 3.9    3/21 1.14 4.1 13.4 2.0  9/21 1.00 4.2 13.1 2.3       DATE TEST EF   1/16 Cath  30-35%  CA normal   4/16  Echo   45-50 %   5/16 myoview    prior infarct?? No ischemia  2/18 ECHO  40-45 %   7/20 TPP scan  Unlikely TTR amyloid  7/20 Echo  35-40% PA press est 35      Antiarrhythmics Date Reason stopped  amiodarone  /  Optic neuritis  Sotalol  4/20    Echo 09/2018 1. The left ventricle has moderately reduced systolic function, with an  ejection fraction of 35-40%. The cavity size was mild to moderately  dilated. Left ventricular diastolic Doppler parameters are consistent with  impaired relaxation. Left ventrical  global hypokinesis with severe apical hypokinesis.  2. The right ventricle has normal systolic function. The cavity was  normal. There is no increase in right ventricular wall thickness. Right  ventricular systolic pressure is mildly elevated with an estimated  pressure of 35.0 mmHg.  3. Left atrial size was mildly dilated.  4. Challenging image quality even with definity.   Myocardial amyloid imaging 09/2018  By semi-quantitative assessment there is Grade 1 that represents uptake less than rib uptake.  The study is equivocal. The study is equivocal for TTR amyloidosis (visual score of 1 or H/CL ratio 1-1.5).  Myoview 07/2014  Resting thallium uptake study performed for viability.  Findings consistent with prior myocardial infarction.  There is significantly decreased uptake in the lateral wall (worst in the mid to apical lateral region, mild uptake in the proximal to mid region)  Apical region with no significant uptake noted.  Anterior, septal, infero/posterior walls with normal perfusion.   Cath report  04/05/14 scan  Assessment & Plan    Nonischemic Cardiomopathy Chronic combined systolic and diastolic heart failure History of aborted cardiac arrest  History of Ventricular tachycardia sustained/syncope History of SB / Chronotropic Incompetence S/p ICD Implantation-[employer] -- Euvolemic and well compensated on exam.  He denies any symptoms of worsening volume status.  He is tolerating spironolactone 25 mg daily well.  We discussed escalation of GDMT, given his heart failure and cardiomyopathy, and with patient  understanding.  Given his soft BP today, we discussed transitioning to a lower dose of Entresto at first to ensure well-tolerated.  We will stop his losartan and start low-dose Entresto 24-26 mg twice daily to be escalated as tolerated with repeat BMET today and again at follow-up.  We discussed future addition of SGLT2 inhibitor at subsequent clinic visits.  Continue lasix. Continue to recommend dietary and lifestyle changes as discussed.    Hypertension --BP today well controlled and noted to be soft with initial BP showing systolic in the 71I and repeat BP showing 102/68.  He denies any associated dizziness with softer BP.  In order to ensure Delene Loll is well-tolerated, we will start add Entresto 24-26 BID and plan for escalation at RTC as tolerated.  BMET today and again at RTC.  HLD --Continue statin / lipitor 10mg  daily for RF modification.   Obesity --Ongoing wt loss and increased activity recommended.   Atrial Fibrillation  persistent  --Continue Coreg and Sotalol. Continue [place] with Eliquis 5mg  BID. No s/sx of bleeding. CHA2DS2VASc score of at least 7 (CHF, HTN, agex2, ?DM (not on any medications but documented in [place]), CVAx2).   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   High Risk Medication Surveillance- sotalol --Continue to follow with EP / Dr. Caryl Comes and to undergo maintenance labs / monitoring as directed.    Medication changes: Discontinue losartan.  Start Entresto 24-26 given low BP and to be uptitrated at RTC.  Labs ordered: BMET Studies / Imaging ordered: None Future considerations: SGLT2i with Jardiance or Farxiga Disposition: RTC 2 weeks for BP check / up-titration of Entresto  *Please be aware that the above documentation was completed voice recognition software and may contain dictation errors.    Arvil Chaco, PA-C 07/25/2020

## 2020-07-24 NOTE — Patient Instructions (Signed)
Medication Instructions:  Your physician has recommended you make the following change in your medication:   STOP Losartan  START Entresto 24-26mg  TWICE daily (start tomorrow)  *If you need a refill on your cardiac medications before your next appointment, please call your pharmacy*   Lab Work: Your physician recommends that you have lab work TODAY: BMET  If you have labs (blood work) drawn today and your tests are completely normal, you will receive your results only by: Marland Kitchen MyChart Message (if you have MyChart) OR . A paper copy in the mail If you have any lab test that is abnormal or we need to change your treatment, we will call you to review the results.   Testing/Procedures: None ordered   Follow-Up: At [facility], you and your health needs are our priority.  As part of our continuing mission to provide you with exceptional heart care, we have created designated Provider Care Teams.  These Care Teams include your primary Cardiologist (physician) and Advanced Practice Providers (APPs -  Physician Assistants and Nurse Practitioners) who all work together to provide you with the care you need, when you need it.  We recommend signing up for the patient portal called "MyChart".  Sign up information is provided on this After Visit Summary.  MyChart is used to connect with patients for Virtual Visits (Telemedicine).  Patients are able to view lab/test results, encounter notes, upcoming appointments, etc.  Non-urgent messages can be sent to your provider as well.   To learn more about what you can do with MyChart, go to [email or web address].    Your next appointment:    Follow up next Friday 08/04/20  The format for your next appointment:   In Person  Provider:   Marrianne Mood, PA-C

## 2020-07-25 LAB — BASIC METABOLIC PANEL
BUN/Creatinine Ratio: 13 (ref 10–24)
BUN: 13 mg/dL (ref 8–27)
CO2: 22 mmol/L (ref 20–29)
Calcium: 9.3 mg/dL (ref 8.6–10.2)
Chloride: 105 mmol/L (ref 96–106)
Creatinine, Ser: 1.01 mg/dL (ref 0.76–1.27)
Glucose: 99 mg/dL (ref 65–99)
Potassium: 4 mmol/L (ref 3.5–5.2)
Sodium: 143 mmol/L (ref 134–144)
eGFR: 77 mL/min/{1.73_m2} (ref >59)

## 2020-08-01 NOTE — Progress Notes (Signed)
Remote ICD transmission.   

## 2020-08-02 NOTE — Progress Notes (Signed)
Office Visit    Patient Name: Ethan Vega. Date of Encounter: 08/04/2020  PCP:  Jodi Marble, MD   [place]  Cardiologist:  Virl Axe, MD  Advanced Practice Provider:  No care team member to display Electrophysiologist:  None   Chief Complaint    Chief Complaint  Patient presents with  . Follow-up    2 Week follow up. Medications verbally reviewed with patient.     75 y.o. 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  Past Medical History    Past Medical History:  Diagnosis Date  . Actinic keratosis   . AICD (automatic cardioverter/defibrillator) present 03/31/14   a. 03/2014 s/p SJM 2411-36C dual chamber AICD (serial Number [id number])- followed by Dr. Caryl Comes  . AICD (automatic cardioverter/defibrillator) present   . Arthritis    a. L knee.  . Asthma   . Atrial fibrillation ([place])   . Basal cell carcinoma 08/29/2008   Vertex scalp. Keratotic pattern, ulcerated.  . Basal cell carcinoma 01/15/2018   Left distal medial thigh near knee. Fibroepithelioma of pinkus type  . Basal cell carcinoma 01/15/2018   Right distal lat. nose ant. inferior edge. Nodular pattern.  . Cancer ([place])    Melanoma resected from scalp  . Cardiac arrest [facility])    a. 03/30/2014 VF arrest in setting of NICM >> CPR/defib in community >> s/p dual chamber AICD  . Cardiac arrest ([place])   . Coronary artery disease, non-occlusive    a. cath 03/2014 at [facility]: no sig CAD (OM1 30%, CFX 30%), EF 35%  . Dysplastic nevus 01/31/2020   L mid back paraspinal - severe,  excision 03/21/2020  . HLD (hyperlipidemia)   . HTN (hypertension)   . Kidney stones   . NICM (nonischemic cardiomyopathy) ([place])    a. 03/2014 Echo:  Inferolateral and lateral HK, EF 53-66%, grade 1 diastolic dysfunction, mild MR, mild LAE, mild RAE, trivial TR, no effusion.  . Paroxysmal atrial fibrillation ([place]) 03/28/2015  . Retinal artery occlusion    Past Surgical History:  Procedure Laterality Date  . adenomatous polyps    . CARDIAC CATHETERIZATION  03/30/2014  . CARDIOVERSION  03/30/2014  . COLONOSCOPY WITH PROPOFOL N/A 03/24/2017   Procedure: COLONOSCOPY WITH PROPOFOL;  Surgeon: Lollie Sails, MD;  Location: [facility] ENDOSCOPY;  Service: Endoscopy;  Laterality: N/A;  . COLONOSCOPY WITH PROPOFOL N/A 01/07/2019   Procedure: COLONOSCOPY WITH PROPOFOL;  Surgeon: Toledo, [name], MD;  Location: [facility] ENDOSCOPY;  Service: Gastroenterology;  Laterality: N/A;  . CYSTOSCOPY W/ LITHOLAPAXY / EHL    . IMPLANTABLE CARDIOVERTER DEFIBRILLATOR IMPLANT N/A 03/31/2014   Procedure: IMPLANTABLE CARDIOVERTER DEFIBRILLATOR IMPLANT;  Surgeon: Evans Lance, MD;  Location: [facility] CATH LAB;  Service: Cardiovascular;  Laterality: N/A;  . JOINT REPLACEMENT    . TONSILLECTOMY     "as a kid"  . TOTAL KNEE ARTHROPLASTY Left ~ 2010  . TOTAL KNEE ARTHROPLASTY WITH REVISION COMPONENTS Left ~ 2011  . VARICOSE VEIN SURGERY Left ~ 2014    Allergies  No Known Allergies  History of Present Illness    Ethan Vega. is a 75 y.o. male with PMH  as above.  He has history of ICD implantation 1/16 following aborted cardiac arrest occurring in the context of nonischemic cardiomyopathy with abnormal thallium scan, suggesting sarcoidosis.  He has a history of recurrent VT and syncopal VT.  This prompted up titration of his amiodarone.  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  He was then started on sotalol.  Flecainide challenge was  negative.  EKG at his previous visit was significant for low voltage and right axis deviation with mild RA enlargement.  He has history of atrial fibrillation on anticoagulation with apixaban.    When seen by Dr. Caryl Comes, he reported some nocturnal dyspnea.  Recommendation was for optimize medical management.  At RTC, recommendation was for change from losartan to [facility] and if stable add SGLT2 inhibitor.  Weight loss was encouraged.  He was last seen in office 07/24/2020 and noted some DOE, attributed to weight and deconditioning.  No chest pain or shortness of breath at rest.  No further PND.  No other reported signs or symptoms of volume overload.  He reported medication compliance.  He was very appreciative for his care.  He was working on lifestyle changes.  He struggled with his weight loss, reporting only 1 pound lost from his previous clinic visit.  He was tolerating spironolactone 25 mg daily.  He did note increased urination on this medication.  Given his history of softer BP, we discussed escalation of GDMT, and with patient report that he is normally not dizzy or lightheaded with systolics into the low 469G.  He was started on low-dose Entresto with losartan discontinued.  Today, 08/04/2020, he returns to clinic and notes he is tolerating low-dose Entresto well and without any dizziness with BP today 104/68.  Weight today decreased 5 lbs from previous clinic visit.  He is trying to get into the gym and increase walking.  He reports home BP 105/75 60 bpm, 107/76 60 bpm, 105/74, with avg SBP typically low 100s and rates in the 60s.  He reports needing a new blood pressure cuff/machine today with office cuff provided.  No recent chest pain or shortness of breath.  He continues to note dyspnea with heavy exertion.  He noticed at one point the lights appeared to move on the ceiling when lowered for his EKG today.  He reports medication compliance.  No signs or symptoms consistent with bleeding.  Home  Medications   Current Outpatient Medications  Medication Instructions  . atorvastatin (LIPITOR) 10 mg, Oral, Daily  . carvedilol (COREG) 25 mg, Oral, 2 times daily with meals, Pt needs to keep upcoming appt in Mar for further refills  . ELIQUIS 5 MG TABS tablet TAKE 1 TABLET BY MOUTH TWICE A DAY  . fluticasone-salmeterol (ADVAIR) 250-50 MCG/ACT AEPB 1 puff, Inhalation, 2 times daily  . furosemide (LASIX) 40 MG tablet Take 1 tablet (40 mg) by mouth twice a day 2 days a week, and take 1 tablet (40 mg) once daily all other days  . levothyroxine (SYNTHROID) 75 mcg, Oral, Daily  . sacubitril-valsartan (ENTRESTO) 24-26 MG 1 tablet, Oral, 2 times daily  . sotalol (BETAPACE) 80 MG tablet TAKE 1 TABLET BY MOUTH 2 TIMES DAILY.  Marland Kitchen spironolactone (ALDACTONE) 25 mg, Oral, Daily     Review of Systems    He denies chest pain, palpitations, dyspnea, pnd, orthopnea, n, v, dizziness, syncope, edema, weight gain, or early satiety.  He denies dizziness with lower BP.  He reports weight loss.  He notices the lights move on the ceiling today for one half of a second and is uncertain if this is important.   All other systems reviewed and are otherwise negative except as noted above.  Physical Exam    VS:  BP 104/68 (BP Location: Left Arm, Patient Position: Sitting, Cuff Size: Large)   Pulse 60   Ht 5\' 6"  (1.676 m)   Wt 239 lb (108.4 kg)   SpO2 95%   BMI 38.58 kg/m  , BMI Body mass index is 38.58 kg/m. GEN: Well nourished, well developed, in no acute distress. HEENT: normal. Neck: Supple no carotid bruits, or masses.  JVD difficult to assess due to body habitus. Cardiac: RRR, no murmurs, rubs, or gallops. No clubbing, cyanosis, edema.  Radials/DP/PT 2+ and equal bilaterally.  Device pocket not TTP and without hematoma or erythema. Respiratory:  Respirations regular and unlabored, clear to auscultation bilaterally. GI: Soft, nontender, nondistended, BS + x 4. MS: no deformity or atrophy. Skin: warm and  dry, no rash. Neuro:  Strength and sensation are intact. Psych: Normal affect.  Accessory Clinical Findings    ECG personally reviewed by me today - atrial paced rhythm at 60bpm, LAD, RBBB, LAFB, low voltage, poor R wave progression, PR interval 238 ms, QTC 438 ms, artifact- no acute changes.  VITALS Reviewed today   Temp Readings from Last 3 Encounters:  01/07/19 97.6 F (36.4 C)  07/30/17 98.2 F (36.8 C) (Oral)  03/24/17 (!) 96.5 F (35.8 C) (Tympanic)   BP Readings from Last 3 Encounters:  08/04/20 104/68  07/24/20 102/68  05/23/20 114/70   Pulse Readings from Last 3 Encounters:  08/04/20 60  07/24/20 64  05/23/20 61    Wt Readings from Last 3 Encounters:  08/04/20 239 lb (108.4 kg)  07/24/20 244 lb (110.7 kg)  05/23/20 245 lb (111.1 kg)     LABS  reviewed today   Lab Results  Component Value Date   WBC 5.5 06/06/2020   HGB 13.3 06/06/2020   HCT 40.7 06/06/2020   MCV 94.7 06/06/2020   PLT 208 06/06/2020   Lab Results  Component Value Date   CREATININE 1.01 07/24/2020   BUN 13 07/24/2020   NA 143 07/24/2020   K 4.0 07/24/2020   CL 105 07/24/2020   CO2 22 07/24/2020   Lab Results  Component Value Date   ALT 35 08/14/2017   AST 32 08/14/2017   ALKPHOS 78 08/14/2017   BILITOT 2.7 (H) 08/14/2017   No results found for: CHOL, HDL, LDLCALC, LDLDIRECT, TRIG, CHOLHDL  Lab Results  Component Value Date   HGBA1C 5.5 03/31/2014   Lab Results  Component Value Date   TSH 2.890 04/28/2018     STUDIES/PROCEDURES reviewed today    Information reviewed from primary cardiologist note as copied and pasted directly below:  Date Cr K Hgb  Mg  1/17       8/17  0.82  14.4   5/18 0.87  13.2   12/18 1.43 4.0 14.3   5/19 0.83 4.0 14.2   3/20 1.09 3.7 13.4   5/20 1.10 4.3   2.3  10/20 0.94 3.9    3/21 1.14 4.1 13.4 2.0  9/21 1.00 4.2 13.1 2.3       DATE TEST EF   1/16 Cath  30-35%  CA normal   4/16  Echo   45-50 %   5/16 myoview    prior infarct?? No ischemia  2/18 ECHO  40-45 %   7/20 TPP scan  Unlikely TTR amyloid  7/20 Echo  35-40% PA press est 35     Antiarrhythmics Date Reason stopped  amiodarone  /  Optic neuritis  Sotalol  4/20    Echo 09/2018 1. The left ventricle has moderately reduced systolic function, with an  ejection fraction of 35-40%. The cavity size was mild to moderately  dilated. Left ventricular diastolic Doppler parameters are consistent with  impaired relaxation. Left ventrical  global hypokinesis with severe apical hypokinesis.  2. The right ventricle has normal systolic function. The cavity was  normal. There is no increase in right ventricular wall thickness. Right  ventricular systolic pressure is mildly elevated with an estimated  pressure of 35.0 mmHg.  3. Left atrial size was mildly dilated.  4. Challenging image quality even with definity.   Myocardial amyloid imaging 09/2018  By semi-quantitative assessment there is Grade 1 that represents uptake less than rib uptake.  The study is equivocal. The study is equivocal for TTR amyloidosis (visual score of 1 or H/CL ratio 1-1.5).  Myoview 07/2014  Resting thallium uptake study performed for viability.  Findings consistent with prior myocardial infarction.  There is significantly decreased uptake in the lateral wall (worst in the mid to apical lateral region, mild uptake in the proximal to mid region)  Apical region with no significant uptake noted.  Anterior, septal, infero/posterior walls with normal perfusion.   Cath report  04/05/14 scan  Assessment & Plan    Nonischemic Cardiomopathy Chronic combined systolic and diastolic heart failure History of aborted cardiac arrest  History of Ventricular tachycardia sustained/syncope History of  SB / Chronotropic Incompetence S/p ICD Implantation-[employer] -- Euvolemic and well compensated on exam.  He denies any symptoms of worsening volume status.  He is tolerating recent addition of Entresto 24-26 twice daily at his previous clinic visit (losartan discontinued).  Unfortunately, soft BP today prevents further escalation of his Entresto dose.  In addition, will defer from adding Farxiga at this time, given he did report the lites as briefly moving today when lying down flat for his EKG.  Recommend escalation of GDMT at return visit if BP/Cr allows, given his heart failure and cardiomyopathy.  Repeat BMET today.  Reassess addition of SGLT2 inhibitor at RTC.  Continue lasix, spironolactone, Entresto. Continue dietary and lifestyle changes as discussed.    Hypertension, current hypotension --BP today soft as seen at previous clinic visits.  He denies any associated dizziness with softer BP, but does comment on the lites moving for 1 second when lowered to lay flat during his EKG today.  Encouraged hydration with total daily fluids under 2 L/day and salt under 2 g/day.  HLD --Continue statin / lipitor 10mg  daily for RF modification.   Obesity --Ongoing wt loss and increased activity recommended.   Atrial Fibrillation persistent  --  Continue Coreg and Sotalol. Continue Lostine with Eliquis 5mg  BID. No s/sx of bleeding. CHA2DS2VASc score of at least 7 (CHF, HTN, agex2, ?DM (not on any medications but documented in [place]), CVAx2).   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   High Risk Medication Surveillance- sotalol --Continue to follow with EP / Dr. Caryl Comes and to undergo maintenance labs / monitoring as directed.    Medication changes: Deferred, given hypotension.  Labs ordered: BMET Studies / Imaging ordered: None Future considerations: SGLT2i with Jardiance or Farxiga, increased dose Entresto if tolerated by  BP Disposition: RTC in 3 to 6 months  *Please be aware that the above documentation was completed voice recognition software and may contain dictation errors.    Arvil Chaco, PA-C 08/04/2020

## 2020-08-04 ENCOUNTER — Ambulatory Visit: Payer: Medicare Other | Admitting: Physician Assistant

## 2020-08-04 ENCOUNTER — Encounter: Payer: Self-pay | Admitting: Physician Assistant

## 2020-08-04 ENCOUNTER — Other Ambulatory Visit: Payer: Self-pay

## 2020-08-04 VITALS — BP 104/68 | HR 60 | Ht 66.0 in | Wt 239.0 lb

## 2020-08-04 DIAGNOSIS — Z95810 Presence of automatic (implantable) cardiac defibrillator: Secondary | ICD-10-CM

## 2020-08-04 DIAGNOSIS — I5042 Chronic combined systolic (congestive) and diastolic (congestive) heart failure: Secondary | ICD-10-CM

## 2020-08-04 DIAGNOSIS — I428 Other cardiomyopathies: Secondary | ICD-10-CM | POA: Diagnosis not present

## 2020-08-04 DIAGNOSIS — I480 Paroxysmal atrial fibrillation: Secondary | ICD-10-CM | POA: Diagnosis not present

## 2020-08-04 DIAGNOSIS — I10 Essential (primary) hypertension: Secondary | ICD-10-CM

## 2020-08-04 DIAGNOSIS — Z79899 Other long term (current) drug therapy: Secondary | ICD-10-CM

## 2020-08-04 NOTE — Patient Instructions (Addendum)
Medication Instructions:  Your physician recommends that you continue on your current medications as directed. Please refer to the Current Medication list given to you today.  *If you need a refill on your cardiac medications before your next appointment, please call your pharmacy*   Lab Work: Your physician recommends that you have lab work TODAY: BMET  If you have labs (blood work) drawn today and your tests are completely normal, you will receive your results only by: Marland Kitchen MyChart Message (if you have MyChart) OR . A paper copy in the mail If you have any lab test that is abnormal or we need to change your treatment, we will call you to review the results.   Testing/Procedures: None ordered   Follow-Up: At [facility], you and your health needs are our priority.  As part of our continuing mission to provide you with exceptional heart care, we have created designated Provider Care Teams.  These Care Teams include your primary Cardiologist (physician) and Advanced Practice Providers (APPs -  Physician Assistants and Nurse Practitioners) who all work together to provide you with the care you need, when you need it.  We recommend signing up for the patient portal called "MyChart".  Sign up information is provided on this After Visit Summary.  MyChart is used to connect with patients for Virtual Visits (Telemedicine).  Patients are able to view lab/test results, encounter notes, upcoming appointments, etc.  Non-urgent messages can be sent to your provider as well.   To learn more about what you can do with MyChart, go to [email or web address].    Your next appointment:   6 month(s)  The format for your next appointment:   In Person  Provider:   You may see Virl Axe, MD or one of the following Advanced Practice Providers on your designated Care Team:    Murray Hodgkins, NP  Christell Faith, PA-C  Marrianne Mood, PA-C  Cadence Primrose, [place]  Laurann Montana, NP

## 2020-08-05 LAB — BASIC METABOLIC PANEL
BUN/Creatinine Ratio: 18 (ref 10–24)
BUN: 19 mg/dL (ref 8–27)
CO2: 22 mmol/L (ref 20–29)
Calcium: 9.5 mg/dL (ref 8.6–10.2)
Chloride: 103 mmol/L (ref 96–106)
Creatinine, Ser: 1.08 mg/dL (ref 0.76–1.27)
Glucose: 105 mg/dL — ABNORMAL HIGH (ref 65–99)
Potassium: 4.4 mmol/L (ref 3.5–5.2)
Sodium: 141 mmol/L (ref 134–144)
eGFR: 71 mL/min/{1.73_m2} (ref >59)

## 2020-08-06 ENCOUNTER — Encounter: Payer: Self-pay | Admitting: Physician Assistant

## 2020-08-08 ENCOUNTER — Telehealth: Payer: Self-pay | Admitting: Internal Medicine

## 2020-08-08 NOTE — Telephone Encounter (Signed)
Patient calling to discuss bp monitor that was given to him.  He states cuff if too small and he wants to know if there is another size available.

## 2020-08-08 NOTE — Telephone Encounter (Signed)
Message sent to Shanda Howells Curator) to inquire about the BP cuffs. Message received stating: One of them is the regular cuff 17 inches and one is The 21 inch cuff per Raquel Sarna which is the large cuff. I can label them when they come in I just ordered more.  Per Penni Homans, RN she verbally told me she spoke with the patient about some lab results and told him that BP cuffs are being ordered and we will reach out to him when they are available.

## 2020-08-09 ENCOUNTER — Ambulatory Visit: Payer: Medicare Other | Admitting: Dermatology

## 2020-08-15 DIAGNOSIS — E032 Hypothyroidism due to medicaments and other exogenous substances: Secondary | ICD-10-CM | POA: Diagnosis not present

## 2020-08-15 DIAGNOSIS — E785 Hyperlipidemia, unspecified: Secondary | ICD-10-CM | POA: Diagnosis not present

## 2020-08-15 DIAGNOSIS — E749 Disorder of carbohydrate metabolism, unspecified: Secondary | ICD-10-CM | POA: Diagnosis not present

## 2020-08-15 DIAGNOSIS — I10 Essential (primary) hypertension: Secondary | ICD-10-CM | POA: Diagnosis not present

## 2020-08-16 DIAGNOSIS — E785 Hyperlipidemia, unspecified: Secondary | ICD-10-CM | POA: Diagnosis not present

## 2020-08-16 DIAGNOSIS — I10 Essential (primary) hypertension: Secondary | ICD-10-CM | POA: Diagnosis not present

## 2020-08-16 DIAGNOSIS — E032 Hypothyroidism due to medicaments and other exogenous substances: Secondary | ICD-10-CM | POA: Diagnosis not present

## 2020-08-16 DIAGNOSIS — H469 Unspecified optic neuritis: Secondary | ICD-10-CM | POA: Diagnosis not present

## 2020-08-16 DIAGNOSIS — I5022 Chronic systolic (congestive) heart failure: Secondary | ICD-10-CM | POA: Diagnosis not present

## 2020-08-16 DIAGNOSIS — Z0001 Encounter for general adult medical examination with abnormal findings: Secondary | ICD-10-CM | POA: Diagnosis not present

## 2020-08-16 DIAGNOSIS — E749 Disorder of carbohydrate metabolism, unspecified: Secondary | ICD-10-CM | POA: Diagnosis not present

## 2020-08-21 ENCOUNTER — Telehealth: Payer: Self-pay | Admitting: Physician Assistant

## 2020-08-21 NOTE — Telephone Encounter (Signed)
Please call to discuss newest medication that was stared a few weeks ago. Patient states he is hallucinating and thinks it may be coming from his medicaiton change.

## 2020-08-21 NOTE — Telephone Encounter (Signed)
Ethan Vega- see pharm D recommendations. Do you agree? Any other recommendations?

## 2020-08-21 NOTE — Telephone Encounter (Signed)
Was able to return Ethan Vega call, he reports he thinks that his medications may be causing his hallucinations. Recently had med adjustments in March with Dr. Caryl Comes and in May with Mickle Plumb, PA-C.  05/23/2020 1) STOP potassium  2) START aldactone (spironolactone) 25 mg- take 1 tablet by mouth once daily  07/24/2020 STOP Losartan  START Entresto 24-26mg  TWICE daily (start tomorrow)  Ethan Vega reports is "seeing insects" stated they are "crawling on the floor at times". Reports no itching and they don't appear to be crawling on him. Unsure when exactly the hallucinations has started, but thinks it has been d/t med changes over the past few months. No other symptoms per Ethan Vega.   Ethan Vega would like Dr. Caryl Comes to review as "he is the man" and "is very familiar with my medications" He has appt with Caryl Comes in Sept of this year, based on Dr. Olin Pia schedule nothing sooner on Tuesday at the [place] office.   Advised will send message to Dr. Olin Pia nurse Nira Conn for review and to our pharmacy team to review pt's medication list. Ethan Vega is very thankful for the return call and reports can call at anytime during the day.

## 2020-08-21 NOTE — Telephone Encounter (Signed)
There are various reports of Entresto causing hallucinations most commonly in males greater than 60 who have been on the med between 1 and 6 months.  It is very rare however.  Would recommend he d/c to see if symptoms resolve.

## 2020-08-21 NOTE — Telephone Encounter (Signed)
Dr. Caryl Comes is out of the office this week. Will also forward to Collie Siad, [place] as he was last seen by her to review as well.

## 2020-08-22 ENCOUNTER — Telehealth: Payer: Self-pay | Admitting: Internal Medicine

## 2020-08-22 NOTE — Telephone Encounter (Signed)
Patient came by office  Patient has questions about a couple medications, not sure of the names Would like to know if there are going to be any changes made before requesting refills  Please call to discuss

## 2020-08-22 NOTE — Addendum Note (Signed)
Addended by: Alvis Lemmings C on: 08/22/2020 04:26 PM   Modules accepted: Orders

## 2020-08-22 NOTE — Telephone Encounter (Signed)
I have reviewed the patient's concerns with Marrianne Mood, PA via secure chat. She advised the patient may stop entresto. He should monitor his BP and call if the numbers are >130/80.   The patient was given a BP cuff by our office a couple of weeks ago, but his was not the correct size.  I had discussed with our manager at that time and she was going to order large cuffs.   The patient is aware I will follow up on the status of this.   I have also advised him that Malachi Bonds was going to review his chart further and we will call back with any further medication changes.   He is aware I will be out the rest of the week, so another nurse should be calling him back.   The patient voices understanding and is agreeable.

## 2020-08-22 NOTE — Telephone Encounter (Signed)
Chart reviewed. Given his history of hypotension, I will hold off on any other medication changes at this time. Agree with all other recommendations as below.

## 2020-08-22 NOTE — Telephone Encounter (Signed)
See 08/21/20 phone encounter.

## 2020-08-23 NOTE — Telephone Encounter (Signed)
We have new BP in stock for patient.

## 2020-08-23 NOTE — Telephone Encounter (Signed)
Pt notified that BP cuffs are in Placed a Large LCD screen (gray cuff is longer) at the front desk for pt to pick up Ethan Vega is thankful, will pick up today.

## 2020-08-24 NOTE — Telephone Encounter (Signed)
Patient came by office Picked up new BP monitor and dropped off old Placed in nurse box

## 2020-08-24 NOTE — Telephone Encounter (Signed)
Spoke to pt. Notified of Ethan Vega's recc below.  Pt voiced understanding of the following: He will continue to hold Entresto and will not make any further med changes at this time.  Pt states he was notified that the larger cuff has been left at the front desk for him to pick.  Verified Large BP cuff at front desk. Pt states he will pick up today.  Pt will monitor BP at home. Discussed checking BP 2 hours after AM medications and to keep a log.  Notify our office if BP is greater than 130/80.   Pt states that his hallucinations have "about 90% disappeared." Feeling much better.  Pt has no further questions/concerns at this time.

## 2020-09-25 ENCOUNTER — Other Ambulatory Visit: Payer: Self-pay | Admitting: Internal Medicine

## 2020-10-10 ENCOUNTER — Ambulatory Visit: Payer: Medicare Other

## 2020-10-29 ENCOUNTER — Other Ambulatory Visit: Payer: Self-pay | Admitting: Internal Medicine

## 2020-10-30 NOTE — Telephone Encounter (Signed)
Eliquis mg refill request received. Patient is 75 years old, weight- 108.4 kg, Crea- 1.08 on 08/04/20 via Epic,  Diagnosis-afib, and last seen by Baird Cancer, PA on 08/04/20 . Dose is appropriate based on dosing criteria. Will send in refill to requested pharmacy.

## 2020-11-08 ENCOUNTER — Other Ambulatory Visit: Payer: Self-pay | Admitting: Internal Medicine

## 2020-11-16 ENCOUNTER — Other Ambulatory Visit: Payer: Self-pay | Admitting: Internal Medicine

## 2020-11-17 DIAGNOSIS — R7301 Impaired fasting glucose: Secondary | ICD-10-CM | POA: Diagnosis not present

## 2020-11-17 DIAGNOSIS — E032 Hypothyroidism due to medicaments and other exogenous substances: Secondary | ICD-10-CM | POA: Diagnosis not present

## 2020-11-17 DIAGNOSIS — E749 Disorder of carbohydrate metabolism, unspecified: Secondary | ICD-10-CM | POA: Diagnosis not present

## 2020-11-17 NOTE — Telephone Encounter (Signed)
This is a [place] pt 

## 2020-11-21 ENCOUNTER — Ambulatory Visit (INDEPENDENT_AMBULATORY_CARE_PROVIDER_SITE_OTHER): Payer: Medicare Other | Admitting: Internal Medicine

## 2020-11-21 ENCOUNTER — Other Ambulatory Visit: Payer: Self-pay

## 2020-11-21 ENCOUNTER — Encounter: Payer: Self-pay | Admitting: Internal Medicine

## 2020-11-21 DIAGNOSIS — R9431 Abnormal electrocardiogram [ECG] [EKG]: Secondary | ICD-10-CM

## 2020-11-21 DIAGNOSIS — I472 Ventricular tachycardia, unspecified: Secondary | ICD-10-CM

## 2020-11-21 DIAGNOSIS — I495 Sick sinus syndrome: Secondary | ICD-10-CM

## 2020-11-21 NOTE — Patient Instructions (Signed)
Medication Instructions:  Your physician recommends that you continue on your current medications as directed. Please refer to the Current Medication list given to you today.  *If you need a refill on your cardiac medications before your next appointment, please call your pharmacy*   Lab Work: None Ordered If you have labs (blood work) drawn today and your tests are completely normal, you will receive your results only by: [place] (if you have MyChart) OR A paper copy in the mail If you have any lab test that is abnormal or we need to change your treatment, we will call you to review the results.   Testing/Procedures: None Ordered   Follow-Up: At [facility], you and your health needs are our priority.  As part of our continuing mission to provide you with exceptional heart care, we have created designated Provider Care Teams.  These Care Teams include your primary Cardiologist (physician) and Advanced Practice Providers (APPs -  Physician Assistants and Nurse Practitioners) who all work together to provide you with the care you need, when you need it.  We recommend signing up for the patient portal called "MyChart".  Sign up information is provided on this After Visit Summary.  MyChart is used to connect with patients for Virtual Visits (Telemedicine).  Patients are able to view lab/test results, encounter notes, upcoming appointments, etc.  Non-urgent messages can be sent to your provider as well.   To learn more about what you can do with MyChart, go to [email or web address].    Your next appointment:    Scheduled with Ignacia Bayley in November 6 Months with Dr. Caryl Comes  The format for your next appointment:   In Person  Provider:   As above   Other Instructions

## 2020-11-21 NOTE — Progress Notes (Signed)
Patient Care Team: Jodi Marble, MD as PCP - General (Internal Medicine) Deboraha Sprang, MD as PCP - Cardiology (Cardiology)   HPI  Ethan Vega. is a 75 y.o. 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 Now on sotalol.   Also Atrial fibrillation anticoagulation w Apixoban, no bleeding.   Flecainide challenge was negative ECG  Low Volts R-V1 with RAD with mild RA enlargement SAECG not done as he was in AFib RVR and rate related RBBB    History of syncopal ventricular tachycardia of 12/18   The patient denies chest pain, nocturnal dyspnea, orthopnea or peripheral edema There have been no palpitations, lightheadedness or syncope.  Complains of chronic stable DOE able to walk flat for about 30 minutes.  C/o R arm numbness occruing while sitting in his chair  newly occurring   Intercurrently, developed visual hallucinations on Entresto; resolved now back on losartan..     BP at home 120/s   Ethan Vega, age 68, HTN-1 DM -1 for CHADSVAS score >= 5                 Date Cr K Hgb  Mg  1/17       8/17  0.82  14.4   5/18 0.87  13.2   12/18 1.43 4.0 14.3   5/19 0.83 4.0 14.2   3/20 1.09 3.7 13.4   5/20 1.10 4.3  2.3  10/20 0.94 3.9    3/21 1.14 4.1 13.4 2.0  9/21 1.00 4.2 13.1 2.3  5/22 1.08 4.4 13.3 (3/22) 2.2 (3/22)        DATE TEST EF   1/16 Cath  30-35%  CA normal   4/16  Echo   45-50 %   5/16 myoview    prior infarct?? No ischemia  2/18 ECHO  40-45 %   7/20 TPP scan  Unlikely TTR amyloid  7/20 Echo  35-40% PA press est 35          Antiarrhythmics Date Reason stopped  amiodarone  /  Optic neuritis  Sotalol  4/20         Past Medical  History:  Diagnosis Date   Actinic keratosis    AICD (automatic cardioverter/defibrillator) present 03/31/14   a. 03/2014 s/p SJM 2411-36C dual chamber AICD (serial Number [id number])- followed by Dr. Caryl Comes   AICD (automatic cardioverter/defibrillator) present    Arthritis    a. L knee.   Asthma    Atrial fibrillation ([place])    Basal cell carcinoma 08/29/2008   Vertex scalp. Keratotic pattern, ulcerated.   Basal cell carcinoma 01/15/2018   Left distal medial thigh near knee. Fibroepithelioma of pinkus type   Basal cell carcinoma 01/15/2018   Right distal lat. nose ant. inferior edge. Nodular pattern.   Cancer [facility])    Melanoma resected from scalp   Cardiac arrest ([place])    a. 03/30/2014 VF arrest in setting of NICM >> CPR/defib in community >> s/p dual chamber AICD   Cardiac arrest [facility])    Coronary artery disease, non-occlusive    a. cath 03/2014 at [facility]: no sig CAD (OM1 30%, CFX 30%), EF 35%   Dysplastic nevus 01/31/2020   L mid back  paraspinal - severe, excision 03/21/2020   HLD (hyperlipidemia)    HTN (hypertension)    Kidney stones    NICM (nonischemic cardiomyopathy) ([place])    a. 03/2014 Echo:  Inferolateral and lateral HK, EF [id number], grade 1 diastolic dysfunction, mild MR, mild LAE, mild RAE, trivial TR, no effusion.   Paroxysmal atrial fibrillation ([place]) 03/28/2015   Retinal artery occlusion     Past Surgical History:  Procedure Laterality Date   adenomatous polyps     CARDIAC CATHETERIZATION  03/30/2014   CARDIOVERSION  03/30/2014   COLONOSCOPY WITH PROPOFOL N/A 03/24/2017   Procedure: COLONOSCOPY WITH PROPOFOL;  Surgeon: Lollie Sails, MD;  Location: [facility] ENDOSCOPY;  Service: Endoscopy;  Laterality: N/A;   COLONOSCOPY WITH PROPOFOL N/A 01/07/2019   Procedure: COLONOSCOPY WITH PROPOFOL;  Surgeon: Toledo, [name], MD;  Location: [facility] ENDOSCOPY;  Service: Gastroenterology;  Laterality: N/A;   CYSTOSCOPY W/ LITHOLAPAXY / EHL     IMPLANTABLE CARDIOVERTER DEFIBRILLATOR IMPLANT  N/A 03/31/2014   Procedure: IMPLANTABLE CARDIOVERTER DEFIBRILLATOR IMPLANT;  Surgeon: Evans Lance, MD;  Location: [facility] CATH LAB;  Service: Cardiovascular;  Laterality: N/A;   JOINT REPLACEMENT     TONSILLECTOMY     "as a kid"   TOTAL KNEE ARTHROPLASTY Left ~ 2010   TOTAL KNEE ARTHROPLASTY WITH REVISION COMPONENTS Left ~ 2011   VARICOSE VEIN SURGERY Left ~ 2014    Current Outpatient Medications  Medication Sig Dispense Refill   atorvastatin (LIPITOR) 10 MG tablet Take 1 tablet (10 mg total) by mouth daily. 90 tablet 3   carvedilol (COREG) 25 MG tablet TAKE 1 TABLET BY MOUTH 2 TIMES DAILY WITH A MEAL. PT NEEDS TO KEEP UPCOMING APPT IN MAR FOR FURTHER REFILLS 180 tablet 2   ELIQUIS 5 MG TABS tablet TAKE 1 TABLET BY MOUTH TWICE A DAY 180 tablet 1   fluticasone-salmeterol (ADVAIR) 250-50 MCG/ACT AEPB Inhale 1 puff into the lungs in the morning and at bedtime.     furosemide (LASIX) 40 MG tablet TAKE 1 TABLET BY MOUTH TWICE A DAY 2 DAYS A WEEK AND 1 TABLET ONCE A DAY ALL OTHER DAYS 108 tablet 0   levothyroxine (SYNTHROID, LEVOTHROID) 75 MCG tablet Take 75 mcg by mouth daily.  2   losartan (COZAAR) 100 MG tablet Take 100 mg by mouth daily.     sotalol (BETAPACE) 80 MG tablet TAKE 1 TABLET BY MOUTH 2 TIMES DAILY. 180 tablet 3   spironolactone (ALDACTONE) 25 MG tablet TAKE 1 TABLET (25 MG TOTAL) BY MOUTH DAILY. 90 tablet 0   No current facility-administered medications for this visit.   Facility-Administered Medications Ordered in Other Visits  Medication Dose Route Frequency Provider Last Rate Last Admin   technetium tetrofosmin (TC-MYOVIEW) injection 123XX123 millicurie  123XX123 millicurie Intravenous Once PRN Fay Records, MD        Allergies  Allergen Reactions   Delene Loll [Sacubitril-Valsartan]     hallucinations    Review of Systems negative except from HPI and PMH  Physical Exam  BP 120/78 (BP Location: Left Arm, Patient Position: Sitting, Cuff Size: Large)   Pulse 61   Ht '5\' 1"'$   (1.549 m)   Wt 230 lb (104.3 kg)   SpO2 98%   BMI 43.46 kg/m  Well developed and well nourished in no acute distress HENT normal Neck supple with JVP-flat Clear Device pocket well healed; without hematoma or erythema.  There is no tethering  Regular rate and rhythm, no  gallop No / murmur Abd-soft with  active BS No Clubbing cyanosis  edema Skin-warm and dry A & Oriented  Grossly normal sensory and motor function  ECG A pacied 61 18/08/42 RAD   Assessment and  Plan  Aborted Cardiac arrest//FHx Cardiac Arrest   Nonischemic cardiomyopathy  ICD  St Jude       Hypertension  Ventricular tachycardia sustained/syncope  High Risk Medication Surveillance-sotalol  Sinus bradycardia/chronotropic incompetence  Low Voltage ECG  iRBBB LAFB  Atrial Fibrillation persistent   Congestive heart failure- acute/ chronic-systolic/diastolic  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 Continue furosemide 40 bid x 2d/week  mg.  Electrolytes are stable.  Tolerating sotalol, continue 80 bid  no AFib or VT  No bleeding on Apixoban , continue 5 bid  With cardiomyopathy, did not tolerate entresto,  continue losartan 100 bid  carvedilol 25 bid, and aldactone 25 daily  Needs repeat surveillance labs 11/22  Sinus bradycardia,  his HR is relatively limited  may be able to make rate response algorithm more aggressive but doing pretty well so will hold off

## 2020-11-22 DIAGNOSIS — J449 Chronic obstructive pulmonary disease, unspecified: Secondary | ICD-10-CM | POA: Diagnosis not present

## 2020-11-22 DIAGNOSIS — I5022 Chronic systolic (congestive) heart failure: Secondary | ICD-10-CM | POA: Diagnosis not present

## 2020-11-22 DIAGNOSIS — G4489 Other headache syndrome: Secondary | ICD-10-CM | POA: Diagnosis not present

## 2020-11-22 DIAGNOSIS — J301 Allergic rhinitis due to pollen: Secondary | ICD-10-CM | POA: Diagnosis not present

## 2020-11-22 DIAGNOSIS — E749 Disorder of carbohydrate metabolism, unspecified: Secondary | ICD-10-CM | POA: Diagnosis not present

## 2020-11-22 DIAGNOSIS — E785 Hyperlipidemia, unspecified: Secondary | ICD-10-CM | POA: Diagnosis not present

## 2020-11-22 DIAGNOSIS — M5412 Radiculopathy, cervical region: Secondary | ICD-10-CM | POA: Diagnosis not present

## 2020-11-22 DIAGNOSIS — E032 Hypothyroidism due to medicaments and other exogenous substances: Secondary | ICD-10-CM | POA: Diagnosis not present

## 2020-11-22 DIAGNOSIS — I10 Essential (primary) hypertension: Secondary | ICD-10-CM | POA: Diagnosis not present

## 2020-12-07 ENCOUNTER — Telehealth: Payer: Self-pay | Admitting: Internal Medicine

## 2020-12-07 NOTE — Telephone Encounter (Signed)
Please advise if ok to refill Historical Provider. 

## 2020-12-07 NOTE — Telephone Encounter (Signed)
This is a [place] pt 

## 2020-12-22 NOTE — Telephone Encounter (Signed)
I called and spoke with the patient. He advised he went to [facility] this morning to get his booster shot and was told he had a losartan RX to pick up. He could not remember if Dr. Caryl Comes stopped this or not.  I advised that when he saw Dr. Caryl Comes on 11/21/20 Losartan 100 mg once daily was on his medication list and Dr. Caryl Comes was going to have him continue this.   The patient voices understanding and is agreeable.

## 2020-12-22 NOTE — Telephone Encounter (Signed)
Patient came by office  Would like to clarify if he should continue taking this medication Please call to discuss

## 2021-01-29 ENCOUNTER — Ambulatory Visit (INDEPENDENT_AMBULATORY_CARE_PROVIDER_SITE_OTHER): Payer: Medicare Other

## 2021-01-29 DIAGNOSIS — I428 Other cardiomyopathies: Secondary | ICD-10-CM

## 2021-01-30 LAB — CUP PACEART REMOTE DEVICE CHECK
Battery Remaining Longevity: 25 mo
Battery Remaining Percentage: 28 %
Battery Voltage: 2.83 V
Brady Statistic AP VP Percent: 1 %
Brady Statistic AP VS Percent: 99 %
Brady Statistic AS VP Percent: 1 %
Brady Statistic AS VS Percent: 1 %
Brady Statistic RA Percent Paced: 97 %
Brady Statistic RV Percent Paced: 1 %
Date Time Interrogation Session: 20221114133231
HighPow Impedance: 72 Ohm
HighPow Impedance: 72 Ohm
Implantable Lead Implant Date: 20160114
Implantable Lead Implant Date: 20160114
Implantable Lead Location: 753859
Implantable Lead Location: 753860
Implantable Lead Model: 7122
Implantable Pulse Generator Implant Date: 20160114
Lead Channel Impedance Value: 350 Ohm
Lead Channel Impedance Value: 360 Ohm
Lead Channel Pacing Threshold Amplitude: 0.625 V
Lead Channel Pacing Threshold Amplitude: 1 V
Lead Channel Pacing Threshold Pulse Width: 0.5 ms
Lead Channel Pacing Threshold Pulse Width: 0.6 ms
Lead Channel Sensing Intrinsic Amplitude: 10.4 mV
Lead Channel Sensing Intrinsic Amplitude: 3.1 mV
Lead Channel Setting Pacing Amplitude: 1.25 V
Lead Channel Setting Pacing Amplitude: 1.625
Lead Channel Setting Pacing Pulse Width: 0.6 ms
Lead Channel Setting Sensing Sensitivity: 0.5 mV
Pulse Gen Serial Number: 7241695

## 2021-02-01 ENCOUNTER — Other Ambulatory Visit: Payer: Self-pay

## 2021-02-01 ENCOUNTER — Ambulatory Visit: Payer: Medicare Other | Admitting: Nurse Practitioner

## 2021-02-01 ENCOUNTER — Encounter: Payer: Self-pay | Admitting: Nurse Practitioner

## 2021-02-01 VITALS — BP 90/60 | HR 60 | Ht 66.0 in | Wt 231.2 lb

## 2021-02-01 DIAGNOSIS — I472 Ventricular tachycardia, unspecified: Secondary | ICD-10-CM

## 2021-02-01 DIAGNOSIS — I5022 Chronic systolic (congestive) heart failure: Secondary | ICD-10-CM

## 2021-02-01 DIAGNOSIS — E782 Mixed hyperlipidemia: Secondary | ICD-10-CM

## 2021-02-01 DIAGNOSIS — R9431 Abnormal electrocardiogram [ECG] [EKG]: Secondary | ICD-10-CM | POA: Diagnosis not present

## 2021-02-01 DIAGNOSIS — I428 Other cardiomyopathies: Secondary | ICD-10-CM | POA: Diagnosis not present

## 2021-02-01 DIAGNOSIS — I4589 Other specified conduction disorders: Secondary | ICD-10-CM

## 2021-02-01 DIAGNOSIS — I480 Paroxysmal atrial fibrillation: Secondary | ICD-10-CM | POA: Diagnosis not present

## 2021-02-01 DIAGNOSIS — I495 Sick sinus syndrome: Secondary | ICD-10-CM | POA: Diagnosis not present

## 2021-02-01 DIAGNOSIS — I952 Hypotension due to drugs: Secondary | ICD-10-CM | POA: Diagnosis not present

## 2021-02-01 LAB — CUP PACEART REMOTE DEVICE CHECK
Battery Remaining Longevity: 28 mo
Battery Remaining Percentage: 30 %
Battery Voltage: 2.84 V
Brady Statistic AP VP Percent: 1 %
Brady Statistic AP VS Percent: 98 %
Brady Statistic AS VP Percent: 1 %
Brady Statistic AS VS Percent: 1 %
Brady Statistic RA Percent Paced: 97 %
Brady Statistic RV Percent Paced: 1 %
Date Time Interrogation Session: 20220726040017
HighPow Impedance: 81 Ohm
HighPow Impedance: 81 Ohm
Implantable Lead Implant Date: 20160114
Implantable Lead Implant Date: 20160114
Implantable Lead Location: 753859
Implantable Lead Location: 753860
Implantable Lead Model: 7122
Implantable Pulse Generator Implant Date: 20160114
Lead Channel Impedance Value: 400 Ohm
Lead Channel Impedance Value: 400 Ohm
Lead Channel Pacing Threshold Amplitude: 0.5 V
Lead Channel Pacing Threshold Amplitude: 1.125 V
Lead Channel Pacing Threshold Pulse Width: 0.5 ms
Lead Channel Pacing Threshold Pulse Width: 0.6 ms
Lead Channel Sensing Intrinsic Amplitude: 11 mV
Lead Channel Sensing Intrinsic Amplitude: 3.2 mV
Lead Channel Setting Pacing Amplitude: 1.375
Lead Channel Setting Pacing Amplitude: 1.5 V
Lead Channel Setting Pacing Pulse Width: 0.6 ms
Lead Channel Setting Sensing Sensitivity: 0.5 mV
Pulse Gen Serial Number: 7241695

## 2021-02-01 MED ORDER — LOSARTAN POTASSIUM 50 MG PO TABS: 50.0000 mg | ORAL_TABLET | Freq: Every day | ORAL | 3 refills | Status: DC

## 2021-02-01 NOTE — Progress Notes (Signed)
Office Visit    Patient Name: Ethan Vega. Date of Encounter: 02/01/2021  Primary Care Provider:  Jodi Marble, MD Primary Cardiologist:  Virl Axe, MD  Chief Complaint    75 year old male with a history of nonischemic cardiomyopathy, HFrEF, VF arrest status post AICD, hypertension, hyperlipidemia, right bundle branch block, and paroxysmal atrial fibrillation, who presents for follow-up related to hypotension.  Past Medical History    Past Medical History:  Diagnosis Date   Actinic keratosis    AICD (automatic cardioverter/defibrillator) present 03/31/2014   a. 03/2014 s/p SJM 2411-36C dual chamber AICD (serial Number [id number])- followed by Dr. Caryl Comes   Arthritis    a. L knee.   Asthma    Basal cell carcinoma 08/29/2008   Vertex scalp. Keratotic pattern, ulcerated.   Basal cell carcinoma 01/15/2018   Left distal medial thigh near knee. Fibroepithelioma of pinkus type   Basal cell carcinoma 01/15/2018   Right distal lat. nose ant. inferior edge. Nodular pattern.   Cardiac arrest ([place])    a. 03/30/2014 VF arrest in setting of NICM >> CPR/defib in community >> s/p dual chamber AICD   Coronary artery disease, non-occlusive    a. cath 03/2014 at [facility]: no sig CAD (OM1 30%, CFX 30%), EF 35%   Dysplastic nevus 01/31/2020   L mid back paraspinal - severe, excision 03/21/2020   HLD (hyperlipidemia)    HTN (hypertension)    Kidney stones    Melanoma ([place])    Melanoma resected from scalp   NICM (nonischemic cardiomyopathy) ([place])    a. 03/2014 Echo:  Inferolateral and lateral HK, EF 11-94%, grade 1 diastolic dysfunction, mild MR, mild LAE, mild RAE, trivial TR, no effusion; 09/2018 Echo: EF 35-40%, impaired relaxation, glob HK, sev apical HK. Nl RV fxn. RVSP 39mmHg. Mildly dil LA.   Paroxysmal atrial fibrillation ([place]) 03/28/2015   Retinal artery occlusion    Past Surgical History:  Procedure Laterality Date   adenomatous polyps     CARDIAC CATHETERIZATION  03/30/2014    CARDIOVERSION  03/30/2014   COLONOSCOPY WITH PROPOFOL N/A 03/24/2017   Procedure: COLONOSCOPY WITH PROPOFOL;  Surgeon: Lollie Sails, MD;  Location: [facility] ENDOSCOPY;  Service: Endoscopy;  Laterality: N/A;   COLONOSCOPY WITH PROPOFOL N/A 01/07/2019   Procedure: COLONOSCOPY WITH PROPOFOL;  Surgeon: Toledo, [name], MD;  Location: [facility] ENDOSCOPY;  Service: Gastroenterology;  Laterality: N/A;   CYSTOSCOPY W/ LITHOLAPAXY / EHL     IMPLANTABLE CARDIOVERTER DEFIBRILLATOR IMPLANT N/A 03/31/2014   Procedure: IMPLANTABLE CARDIOVERTER DEFIBRILLATOR IMPLANT;  Surgeon: Evans Lance, MD;  Location: [facility] CATH LAB;  Service: Cardiovascular;  Laterality: N/A;   JOINT REPLACEMENT     TONSILLECTOMY     "as a kid"   TOTAL KNEE ARTHROPLASTY Left ~ 2010   TOTAL KNEE ARTHROPLASTY WITH REVISION COMPONENTS Left ~ 2011   VARICOSE VEIN SURGERY Left ~ 2014    Allergies  Allergies  Allergen Reactions   Entresto [Sacubitril-Valsartan]     hallucinations    History of Present Illness    75 year old male with above past medical history including nonischemic cardiomyopathy, HFrEF, VF arrest status post AICD, hypertension, hyperlipidemia, right bundle branch block, and paroxysmal atrial fibrillation.  As noted above, he suffered a VF arrest in January 2016 with catheterization at that time showing nonobstructive OM1 and circumflex disease.  EF by ventriculography was 35%.  He underwent Saint Jude dual-chamber AICD.  Resting thallium scan was performed for viability showed findings consistent with prior myocardial infarction  and significantly decreased uptake in the lateral wall.  There was no significant uptake noted in the apical region while the other areas showed normal perfusion.  There was initially concern about infiltrative process/sarcoid however, further evaluation was unremarkable.  In December 2018, he was admitted following a syncopal episode and ventricular tachycardia.  VT was below the detection rate of  his ICD his device was reprogrammed.  Amiodarone was also titrated with subsequent development of visual loss and concern for optic neuritis with subsequent discontinuation of amiodarone and later initiation of sotalol.  Mr. Parmer was last seen in cardiology clinic in September, at which time he was doing well.  It was noted that Delene Loll was discontinued secondary to hallucinations and he has since been taking and tolerating losartan.  Today, Mr. Winski notes that he has continued to feel well though over the past few days, his blood pressures have been trending in the mid 80s and sometimes in the mid 70s.  He does not think he is had a blood pressure over 100 in a few months.  He is not particularly symptomatic.  He denies chest pain, dyspnea, palpitations, PND, orthopnea, dizziness, syncope, edema, or early satiety.  Home Medications    Current Outpatient Medications  Medication Sig Dispense Refill   atorvastatin (LIPITOR) 10 MG tablet Take 1 tablet (10 mg total) by mouth daily. 90 tablet 3   carvedilol (COREG) 25 MG tablet TAKE 1 TABLET BY MOUTH 2 TIMES DAILY WITH A MEAL. PT NEEDS TO KEEP UPCOMING APPT IN MAR FOR FURTHER REFILLS 180 tablet 2   ELIQUIS 5 MG TABS tablet TAKE 1 TABLET BY MOUTH TWICE A DAY 180 tablet 1   fluticasone-salmeterol (ADVAIR) 250-50 MCG/ACT AEPB Inhale 1 puff into the lungs in the morning and at bedtime.     furosemide (LASIX) 40 MG tablet TAKE 1 TABLET BY MOUTH TWICE A DAY 2 DAYS A WEEK AND 1 TABLET ONCE A DAY ALL OTHER DAYS 108 tablet 0   levothyroxine (SYNTHROID, LEVOTHROID) 75 MCG tablet Take 75 mcg by mouth daily.  2   losartan (COZAAR) 50 MG tablet Take 1 tablet (50 mg total) by mouth daily. 90 tablet 3   sotalol (BETAPACE) 80 MG tablet TAKE 1 TABLET BY MOUTH 2 TIMES DAILY. 180 tablet 3   spironolactone (ALDACTONE) 25 MG tablet TAKE 1 TABLET (25 MG TOTAL) BY MOUTH DAILY. 90 tablet 0   No current facility-administered medications for this visit.    Facility-Administered Medications Ordered in Other Visits  Medication Dose Route Frequency Provider Last Rate Last Admin   technetium tetrofosmin (TC-MYOVIEW) injection 28.3 millicurie  15.1 millicurie Intravenous Once PRN Fay Records, MD         Review of Systems    Overall doing well.He denies chest pain, palpitations, dyspnea, pnd, orthopnea, n, v, dizziness, syncope, edema, weight gain, or early satiety.  All other systems reviewed and are otherwise negative except as noted above.    Physical Exam    VS:  BP 90/60 (BP Location: Left Arm, Patient Position: Sitting, Cuff Size: Normal)   Pulse 60   Ht 5\' 6"  (1.676 m)   Wt 231 lb 4 oz (104.9 kg)   SpO2 95%   BMI 37.32 kg/m  , BMI Body mass index is 37.32 kg/m.     GEN: Obese, in no acute distress. HEENT: normal. Neck: Supple, no JVD, carotid bruits, or masses. Cardiac: RRR, no murmurs, rubs, or gallops. No clubbing, cyanosis, edema.  Radials/PT 2+ and equal  bilaterally.  Respiratory:  Respirations regular and unlabored, clear to auscultation bilaterally. GI: Soft, nontender, nondistended, BS + x 4. MS: no deformity or atrophy. Skin: warm and dry, with flaking of lower legs. Neuro:  Strength and sensation are intact. Psych: Normal affect.  Accessory Clinical Findings    ECG personally reviewed by me today -a paced, V sensed, 60, right bundle branch block, lateral infarct- no acute changes.  Lab Results  Component Value Date   WBC 5.5 06/06/2020   HGB 13.3 06/06/2020   HCT 40.7 06/06/2020   MCV 94.7 06/06/2020   PLT 208 06/06/2020   Lab Results  Component Value Date   CREATININE 1.08 08/04/2020   BUN 19 08/04/2020   NA 141 08/04/2020   K 4.4 08/04/2020   CL 103 08/04/2020   CO2 22 08/04/2020   Lab Results  Component Value Date   ALT 35 08/14/2017   AST 32 08/14/2017   ALKPHOS 78 08/14/2017   BILITOT 2.7 (H) 08/14/2017     Lab Results  Component Value Date   HGBA1C 5.5 03/31/2014    Assessment &  Plan    1.  Hypotension: Patient's blood pressures have been trending less than 100 for several months and over the past few days, in the 70s and 80s.  He is 90/60 today.  Fortunately, he has not been particularly symptomatic.  I am going to reduce his losartan from 100mg  to 50 mg daily.  For now, he will otherwise continue carvedilol and spironolactone.  2.  Chronic heart failure with reduced ejection fraction/nonischemic cardiomyopathy: Euvolemic on examination.  Doing well at home.  Reducing losartan the setting of hypotension.  Continue beta-blocker and spironolactone.  He did not previously tolerate Entresto secondary to hallucinations.  With soft blood pressures, will hold off on initiating SGLT2 inhibitor at this time.  3.  VF/VT: Status post ICD.  Remains on sotalol therapy.  4.  Paroxysmal atrial fibrillation: Maintaining sinus rhythm.  Continue beta-blocker, sotalol, and Eliquis.  5.  Sinus bradycardia/chronotropic incompetence: A paced.  6.  Hyperlipidemia: Remains on statin therapy.  7.  Disposition: Offered early follow-up in 2 to 3 weeks to reevaluate blood pressures.  Patient prefers to check pressures at home and will contact us.  Follow-up in clinic in 2 to 3 months.   Murray Hodgkins, NP 02/01/2021, 4:43 PM

## 2021-02-01 NOTE — Patient Instructions (Signed)
Medication Instructions:  Your physician has recommended you make the following change in your medication:   DECREASE Losartan 50 mg once a day  *If you need a refill on your cardiac medications before your next appointment, please call your pharmacy*   Lab Work: [place] today  If you have labs (blood work) drawn today and your tests are completely normal, you will receive your results only by: [place] (if you have MyChart) OR A paper copy in the mail If you have any lab test that is abnormal or we need to change your treatment, we will call you to review the results.   Testing/Procedures: None   Follow-Up: At [facility], you and your health needs are our priority.  As part of our continuing mission to provide you with exceptional heart care, we have created designated Provider Care Teams.  These Care Teams include your primary Cardiologist (physician) and Advanced Practice Providers (APPs -  Physician Assistants and Nurse Practitioners) who all work together to provide you with the care you need, when you need it.  We recommend signing up for the patient portal called "MyChart".  Sign up information is provided on this After Visit Summary.  MyChart is used to connect with patients for Virtual Visits (Telemedicine).  Patients are able to view lab/test results, encounter notes, upcoming appointments, etc.  Non-urgent messages can be sent to your provider as well.   To learn more about what you can do with MyChart, go to [email or web address].    Your next appointment:   2-3 month(s)  The format for your next appointment:   In Person  Provider:   Virl Axe, MD or Murray Hodgkins, NP

## 2021-02-02 ENCOUNTER — Telehealth: Payer: Self-pay | Admitting: *Deleted

## 2021-02-02 DIAGNOSIS — I5022 Chronic systolic (congestive) heart failure: Secondary | ICD-10-CM

## 2021-02-02 DIAGNOSIS — I480 Paroxysmal atrial fibrillation: Secondary | ICD-10-CM

## 2021-02-02 LAB — CBC
Hematocrit: 41.2 % (ref 37.5–51.0)
Hemoglobin: 13.7 g/dL (ref 13.0–17.7)
MCH: 31.1 pg (ref 26.6–33.0)
MCHC: 33.3 g/dL (ref 31.5–35.7)
MCV: 94 fL (ref 79–97)
Platelets: 231 10*3/uL (ref 150–450)
RBC: 4.4 x10E6/uL (ref 4.14–5.80)
RDW: 12.8 % (ref 11.6–15.4)
WBC: 8.1 10*3/uL (ref 3.4–10.8)

## 2021-02-02 LAB — BASIC METABOLIC PANEL
BUN/Creatinine Ratio: 17 (ref 10–24)
BUN: 22 mg/dL (ref 8–27)
CO2: 22 mmol/L (ref 20–29)
Calcium: 9.6 mg/dL (ref 8.6–10.2)
Chloride: 103 mmol/L (ref 96–106)
Creatinine, Ser: 1.28 mg/dL — ABNORMAL HIGH (ref 0.76–1.27)
Glucose: 99 mg/dL (ref 70–99)
Potassium: 4.2 mmol/L (ref 3.5–5.2)
Sodium: 137 mmol/L (ref 134–144)
eGFR: 58 mL/min/{1.73_m2} — ABNORMAL LOW (ref >59)

## 2021-02-02 NOTE — Telephone Encounter (Signed)
-----   Message from Theora Gianotti, NP sent at 02/02/2021  7:29 AM EST ----- Blood counts and electrolytes are normal.  His kidneys appear to be a little bit dry, which may be contributing to his low blood pressures.  I reduced his losartan dose yesterday.  We may need to reduce dose of Lasix but we will see how he does on reduced dose of losartan first.  Follow-up basic metabolic panel in 1 week.

## 2021-02-02 NOTE — Telephone Encounter (Signed)
Reviewed results and recommendations with patient. Scheduled repeat labs and he states that if weather is bad then he is not able to drive. Instructed him to please call if he is unable to keep that appointment. He verbalized understanding, confirmed appointment, and had no further questions at this time.

## 2021-02-06 NOTE — Progress Notes (Signed)
Remote ICD transmission.   

## 2021-02-12 ENCOUNTER — Other Ambulatory Visit (INDEPENDENT_AMBULATORY_CARE_PROVIDER_SITE_OTHER): Payer: Medicare Other

## 2021-02-12 ENCOUNTER — Other Ambulatory Visit: Payer: Self-pay

## 2021-02-12 ENCOUNTER — Telehealth: Payer: Self-pay | Admitting: Nurse Practitioner

## 2021-02-12 DIAGNOSIS — I5022 Chronic systolic (congestive) heart failure: Secondary | ICD-10-CM

## 2021-02-12 DIAGNOSIS — I480 Paroxysmal atrial fibrillation: Secondary | ICD-10-CM

## 2021-02-12 NOTE — Telephone Encounter (Signed)
List of BP (HR) readings pulled from nurse bin that the patient dropped off today:  11/18- 90/64 (60) 11/19- 99/69 (64) 11/20- 107/70 (60) 11/21- 94/66 (60) 11/22- 93/64 (60) 11/23- 99/61 (61) 11/24- 87/60 (63) 11/25- 89/66 (58) 11/26- 88/65 (60) 11/27- 99/62 (60)   The patient was seen in the office by Ignacia Bayley, NP on 02/01/21 and BP was 90/60. It was noted that recent BP's had been 34'J & 17'H systolic.  Chris decreased the patient's losartan dose to 50 mg once daily at his 02/01/21 office visit.   I called the patient today to confirm the above readings are on the losartan 50 mg once daily dose. The patient confirms this and states he feels better on the lower dose losartan.  I have also confirmed with him that he is taking: - coreg 25 mg BID - lasix 40 mg- 1 tablet QD except for Tues/ Thurs he take this BID - spironolactone 25 mg QD   The patient states that occasionally his BP cuff will not register and it will take him trying to 3-4 times to get a reading. I advised this may be due to the fact that his BP is running low.  I advised the patient I will forward the above readings to Ignacia Bayley, NP for further review, but we will most likely be calling him back with further recommendations to cut back on his medication a little further.  The patient voices understanding and is agreeable.

## 2021-02-13 LAB — BASIC METABOLIC PANEL
BUN/Creatinine Ratio: 18 (ref 10–24)
BUN: 21 mg/dL (ref 8–27)
CO2: 23 mmol/L (ref 20–29)
Calcium: 9.1 mg/dL (ref 8.6–10.2)
Chloride: 104 mmol/L (ref 96–106)
Creatinine, Ser: 1.2 mg/dL (ref 0.76–1.27)
Glucose: 99 mg/dL (ref 70–99)
Potassium: 4.2 mmol/L (ref 3.5–5.2)
Sodium: 139 mmol/L (ref 134–144)
eGFR: 62 mL/min/{1.73_m2} (ref >59)

## 2021-02-13 MED ORDER — LOSARTAN POTASSIUM 25 MG PO TABS: 25.0000 mg | ORAL_TABLET | Freq: Every day | ORAL | 3 refills | Status: DC

## 2021-02-13 NOTE — Telephone Encounter (Signed)
Reviewed recommendations from provider with patient. He verbalized understanding and will try to cut his current pills in half. He did request new prescription to prevent running out of medication or if he is not successful in cutting those in half. Confirmed pharmacy and sent that new order over to them. Instructed him to continue monitoring his blood pressures and to let us know if he should have any further problems. He verbalized understanding of our conversation, agreement with plan, and had no further questions at this time.    ----- Message from Theora Gianotti, NP sent at 02/12/2021  5:10 PM EST -----  I'm glad Ethan Vega is feeling better, however, his BPs remains very low at times.  I'd like him to reduce his losartan to 25 mg daily.

## 2021-02-15 NOTE — Telephone Encounter (Signed)
Spoke with patient to review some results and he updated me on his blood pressures. Blood pressure has been 107/72 HR 60 and 108/72 HR 60. He states they have been good and that he is feeling great. He was very appreciative for the help and no further concerns at this time.

## 2021-02-19 ENCOUNTER — Other Ambulatory Visit: Payer: Self-pay | Admitting: Internal Medicine

## 2021-02-20 NOTE — Telephone Encounter (Signed)
This is a [place] pt 

## 2021-02-26 DIAGNOSIS — I10 Essential (primary) hypertension: Secondary | ICD-10-CM | POA: Diagnosis not present

## 2021-02-26 DIAGNOSIS — E749 Disorder of carbohydrate metabolism, unspecified: Secondary | ICD-10-CM | POA: Diagnosis not present

## 2021-02-26 DIAGNOSIS — K760 Fatty (change of) liver, not elsewhere classified: Secondary | ICD-10-CM | POA: Diagnosis not present

## 2021-02-26 DIAGNOSIS — R7301 Impaired fasting glucose: Secondary | ICD-10-CM | POA: Diagnosis not present

## 2021-02-28 DIAGNOSIS — J301 Allergic rhinitis due to pollen: Secondary | ICD-10-CM | POA: Diagnosis not present

## 2021-02-28 DIAGNOSIS — E032 Hypothyroidism due to medicaments and other exogenous substances: Secondary | ICD-10-CM | POA: Diagnosis not present

## 2021-02-28 DIAGNOSIS — I5022 Chronic systolic (congestive) heart failure: Secondary | ICD-10-CM | POA: Diagnosis not present

## 2021-02-28 DIAGNOSIS — E785 Hyperlipidemia, unspecified: Secondary | ICD-10-CM | POA: Diagnosis not present

## 2021-02-28 DIAGNOSIS — G4489 Other headache syndrome: Secondary | ICD-10-CM | POA: Diagnosis not present

## 2021-02-28 DIAGNOSIS — M5412 Radiculopathy, cervical region: Secondary | ICD-10-CM | POA: Diagnosis not present

## 2021-02-28 DIAGNOSIS — E749 Disorder of carbohydrate metabolism, unspecified: Secondary | ICD-10-CM | POA: Diagnosis not present

## 2021-04-04 ENCOUNTER — Telehealth: Payer: Self-pay

## 2021-04-04 NOTE — Telephone Encounter (Signed)
Attempted to call the patient. No answer- I left a detailed message on his identified voice mail (ok per DPR) advising that Sotalol is an antiarrhythmic drug used to treat his heart rhythm, and that the Carvedilol is used more for heart rate/ blood pressure control, as well as offering some protection to his heart muscle. Therefore, he is ok to take both of these medications together as they do work differently.  I have asked that he call back with any further questions/ concerns.

## 2021-04-04 NOTE — Telephone Encounter (Signed)
Patient states his pharmacist states Carvedilol and Sotalol are the same. Please call to discuss if he should be taking both of these medications.

## 2021-04-17 DIAGNOSIS — R21 Rash and other nonspecific skin eruption: Secondary | ICD-10-CM | POA: Diagnosis not present

## 2021-04-30 ENCOUNTER — Ambulatory Visit (INDEPENDENT_AMBULATORY_CARE_PROVIDER_SITE_OTHER): Payer: Medicare Other

## 2021-04-30 DIAGNOSIS — I495 Sick sinus syndrome: Secondary | ICD-10-CM

## 2021-04-30 LAB — CUP PACEART REMOTE DEVICE CHECK
Battery Remaining Longevity: 24 mo
Battery Remaining Percentage: 26 %
Battery Voltage: 2.81 V
Brady Statistic AP VP Percent: 1 %
Brady Statistic AP VS Percent: 99 %
Brady Statistic AS VP Percent: 1 %
Brady Statistic AS VS Percent: 1 %
Brady Statistic RA Percent Paced: 97 %
Brady Statistic RV Percent Paced: 1 %
Date Time Interrogation Session: 20230213020016
HighPow Impedance: 72 Ohm
HighPow Impedance: 72 Ohm
Implantable Lead Implant Date: 20160114
Implantable Lead Implant Date: 20160114
Implantable Lead Location: 753859
Implantable Lead Location: 753860
Implantable Lead Model: 7122
Implantable Pulse Generator Implant Date: 20160114
Lead Channel Impedance Value: 350 Ohm
Lead Channel Impedance Value: 360 Ohm
Lead Channel Pacing Threshold Amplitude: 0.625 V
Lead Channel Pacing Threshold Amplitude: 1.25 V
Lead Channel Pacing Threshold Pulse Width: 0.5 ms
Lead Channel Pacing Threshold Pulse Width: 0.6 ms
Lead Channel Sensing Intrinsic Amplitude: 3.2 mV
Lead Channel Sensing Intrinsic Amplitude: 9.9 mV
Lead Channel Setting Pacing Amplitude: 1.5 V
Lead Channel Setting Pacing Amplitude: 1.625
Lead Channel Setting Pacing Pulse Width: 0.6 ms
Lead Channel Setting Sensing Sensitivity: 0.5 mV
Pulse Gen Serial Number: 7241695

## 2021-05-01 ENCOUNTER — Encounter: Payer: Self-pay | Admitting: Internal Medicine

## 2021-05-01 ENCOUNTER — Other Ambulatory Visit: Payer: Self-pay

## 2021-05-01 ENCOUNTER — Ambulatory Visit (INDEPENDENT_AMBULATORY_CARE_PROVIDER_SITE_OTHER): Payer: Medicare Other | Admitting: Internal Medicine

## 2021-05-01 VITALS — BP 100/66 | HR 60 | Ht 66.0 in | Wt 229.4 lb

## 2021-05-01 DIAGNOSIS — I472 Ventricular tachycardia, unspecified: Secondary | ICD-10-CM | POA: Diagnosis not present

## 2021-05-01 DIAGNOSIS — I428 Other cardiomyopathies: Secondary | ICD-10-CM

## 2021-05-01 DIAGNOSIS — Z95810 Presence of automatic (implantable) cardiac defibrillator: Secondary | ICD-10-CM

## 2021-05-01 DIAGNOSIS — I5022 Chronic systolic (congestive) heart failure: Secondary | ICD-10-CM

## 2021-05-01 DIAGNOSIS — I480 Paroxysmal atrial fibrillation: Secondary | ICD-10-CM | POA: Diagnosis not present

## 2021-05-01 DIAGNOSIS — Z79899 Other long term (current) drug therapy: Secondary | ICD-10-CM

## 2021-05-01 DIAGNOSIS — I495 Sick sinus syndrome: Secondary | ICD-10-CM

## 2021-05-01 DIAGNOSIS — I469 Cardiac arrest, cause unspecified: Secondary | ICD-10-CM

## 2021-05-01 LAB — PACEMAKER DEVICE OBSERVATION

## 2021-05-01 NOTE — Progress Notes (Signed)
Patient Care Team: Jodi Marble, MD as PCP - General (Internal Medicine) Deboraha Sprang, MD as PCP - Cardiology (Cardiology)   HPI  Ethan Vega. is a 76 y.o. 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    On sotalol.   Atrial fibrillation-persistent, anticoagulation w Apixoban--no bleeding.  He comes in today concerned about his frailty because GI told him that they would not undertake colonoscopy. He reminds me that he was once very strong and strapping.  Now has weakness in his right hand. Was able to walk a few 100 yards from the parking lot into the building this morning without difficulty.  Sleeps in a recliner.  No edema.  Denies chest pain.  Eval for Cardiac Arrest Flecainide challenge was negative ECG  Low Volts R-V1 with RAD with mild RA enlargement SAECG not done as he was in AFib RVR and rate related RBBB Abnormal thallium scan suggesting sarcoid      Intercurrently, developed visual hallucinations on Entresto; resolved now back on losartan.Ethan Vega, age 89, HTN-1 DM -1 for CHADSVAS score >= 6                Date Cr K Hgb  Mg  1/17       8/17  0.82  14.4   5/18 0.87  13.2   12/18 1.43 4.0 14.3   5/19 0.83 4.0 14.2   3/20 1.09 3.7 13.4   5/20 1.10 4.3  2.3  10/20 0.94 3.9    3/21 1.14 4.1 13.4 2.0  9/21 1.00 4.2 13.1 2.3  5/22 1.08 4.4 13.3 (3/22) 2.2 (3/22)   11/22 1.2 4.2 13.7        DATE TEST EF   1/16 Cath  30-35%  CA normal   4/16  Echo   45-50 %   5/16 myoview    prior infarct?? No ischemia  2/18 ECHO  40-45 %   7/20 TPP scan  Unlikely TTR amyloid  7/20 Echo  35-40% PA press est 35          Antiarrhythmics Date Reason stopped  amiodarone  /  Optic neuritis  Sotalol  4/20         Past Medical  History:  Diagnosis Date   Actinic keratosis    AICD (automatic cardioverter/defibrillator) present 03/31/2014   a. 03/2014 s/p SJM 2411-36C dual chamber AICD (serial Number [id number])- followed by Dr. Caryl Comes   Arthritis    a. L knee.   Asthma    Basal cell carcinoma 08/29/2008   Vertex scalp. Keratotic pattern, ulcerated.   Basal cell carcinoma 01/15/2018   Left distal medial thigh near knee. Fibroepithelioma of pinkus type   Basal cell carcinoma 01/15/2018   Right distal lat. nose ant. inferior edge. Nodular pattern.   Cardiac arrest ([place])    a. 03/30/2014 VF arrest in setting of NICM >> CPR/defib in community >> s/p dual chamber AICD   Coronary artery disease, non-occlusive    a. cath 03/2014 at [facility]: no sig CAD (OM1 30%, CFX 30%), EF 35%   Dysplastic nevus 01/31/2020   L mid back paraspinal - severe, excision 03/21/2020   HLD (hyperlipidemia)    HTN (hypertension)    Kidney stones    Melanoma ([place])  Melanoma resected from scalp   NICM (nonischemic cardiomyopathy) ([place])    a. 03/2014 Echo:  Inferolateral and lateral HK, EF 19-50%, grade 1 diastolic dysfunction, mild MR, mild LAE, mild RAE, trivial TR, no effusion; 09/2018 Echo: EF 35-40%, impaired relaxation, glob HK, sev apical HK. Nl RV fxn. RVSP 65mmHg. Mildly dil LA.   Paroxysmal atrial fibrillation ([place]) 03/28/2015   Retinal artery occlusion     Past Surgical History:  Procedure Laterality Date   adenomatous polyps     CARDIAC CATHETERIZATION  03/30/2014   CARDIOVERSION  03/30/2014   COLONOSCOPY WITH PROPOFOL N/A 03/24/2017   Procedure: COLONOSCOPY WITH PROPOFOL;  Surgeon: Lollie Sails, MD;  Location: [facility] ENDOSCOPY;  Service: Endoscopy;  Laterality: N/A;   COLONOSCOPY WITH PROPOFOL N/A 01/07/2019   Procedure: COLONOSCOPY WITH PROPOFOL;  Surgeon: Toledo, [name], MD;  Location: [facility] ENDOSCOPY;  Service: Gastroenterology;  Laterality: N/A;   CYSTOSCOPY W/ LITHOLAPAXY / EHL     IMPLANTABLE CARDIOVERTER DEFIBRILLATOR  IMPLANT N/A 03/31/2014   Procedure: IMPLANTABLE CARDIOVERTER DEFIBRILLATOR IMPLANT;  Surgeon: Evans Lance, MD;  Location: [facility] CATH LAB;  Service: Cardiovascular;  Laterality: N/A;   JOINT REPLACEMENT     TONSILLECTOMY     "as a kid"   TOTAL KNEE ARTHROPLASTY Left ~ 2010   TOTAL KNEE ARTHROPLASTY WITH REVISION COMPONENTS Left ~ 2011   VARICOSE VEIN SURGERY Left ~ 2014    Current Outpatient Medications  Medication Sig Dispense Refill   atorvastatin (LIPITOR) 10 MG tablet Take 1 tablet (10 mg total) by mouth daily. 90 tablet 3   carvedilol (COREG) 25 MG tablet TAKE 1 TABLET BY MOUTH 2 TIMES DAILY WITH A MEAL. PT NEEDS TO KEEP UPCOMING APPT IN MAR FOR FURTHER REFILLS 180 tablet 2   ELIQUIS 5 MG TABS tablet TAKE 1 TABLET BY MOUTH TWICE A DAY 180 tablet 1   fluticasone-salmeterol (WIXELA INHUB) 250-50 MCG/ACT AEPB Inhale 1 puff into the lungs in the morning and at bedtime.     furosemide (LASIX) 40 MG tablet TAKE 1 TABLET BY MOUTH TWICE A DAY 2 DAYS A WEEK AND 1 TABLET ONCE A DAY ALL OTHER DAYS 108 tablet 0   levothyroxine (SYNTHROID, LEVOTHROID) 75 MCG tablet Take 75 mcg by mouth daily.  2   losartan (COZAAR) 25 MG tablet Take 1 tablet (25 mg total) by mouth daily. 90 tablet 3   sotalol (BETAPACE) 80 MG tablet TAKE 1 TABLET BY MOUTH 2 TIMES DAILY. 180 tablet 3   spironolactone (ALDACTONE) 25 MG tablet TAKE 1 TABLET (25 MG TOTAL) BY MOUTH DAILY. 90 tablet 0   fluticasone-salmeterol (ADVAIR) 250-50 MCG/ACT AEPB Inhale 1 puff into the lungs in the morning and at bedtime. (Patient not taking: Reported on 05/01/2021)     gabapentin (NEURONTIN) 100 MG capsule Take 100 mg by mouth 3 (three) times daily. (Patient not taking: Reported on 05/01/2021)     No current facility-administered medications for this visit.   Facility-Administered Medications Ordered in Other Visits  Medication Dose Route Frequency Provider Last Rate Last Admin   technetium tetrofosmin (TC-MYOVIEW) injection 93.2 millicurie  67.1  millicurie Intravenous Once PRN Fay Records, MD        Allergies  Allergen Reactions   Entresto [Sacubitril-Valsartan]     hallucinations    Review of Systems negative except from HPI and PMH  Physical Exam  BP 100/66 (BP Location: Left Arm, Patient Position: Sitting, Cuff Size: Normal)    Pulse 60    Ht 5\' 6"  (  1.676 m)    Wt 229 lb 6.4 oz (104.1 kg)    SpO2 95%    BMI 37.03 kg/m  Well developed and Morbidly obese  in no acute distress HENT normal Neck supple with JVP ,8 Clear Device pocket well healed; without hematoma or erythema.  There is no tethering  Regular rate and rhythm, no murmur Abd-soft with active BS No Clubbing cyanosis   edema Skin-warm and dry A & Oriented  Grossly normal sensory and motor function Right thenar atrophy and right hand weakness  ECG atrial pacing at 60 Intervals 24/09/44 Low voltage he is not allowing this the question was and I asked him about it was.  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  We will continue him and furosemide 40 twice daily 2 days a week and 40 daily the rest of the week.  Cardiomyopathy has not been reassessed but his functional status is stable.  We will continue him on carvedilol 25 twice daily losartan 25 and spironolactone 25.  He is not able to tolerate Entresto.  We will have him look into his insurance coverage for an SGLT2.  No interval ventricular tachycardia.  We will continue him on sotalol 80 twice daily.  We will need to check his  magnesium for surveillance today.  Discussed his functional status and end-of-life status.  I do not think it is as grim as he outlined.  No bleeding on Eliquis.  Continue 5 mg twice daily and sotalol.  Has right hand weakness.  I suspect is carpal tunnel.  Suggested he get an over-the-counter splint and then follow-up with his primary care physician

## 2021-05-01 NOTE — Patient Instructions (Addendum)
Medication Instructions:  - Your physician recommends that you continue on your current medications as directed. Please refer to the Current Medication list given to you today.   - Check with your insurance to see which medication they prefer: Jardiance/ Farixga  - once we know this, then we can proceed with ordering this and look into patient assistance   *If you need a refill on your cardiac medications before your next appointment, please call your pharmacy*   Lab Work: - Your physician recommends that you have lab work today: Magnesium   If you have labs (blood work) drawn today and your tests are completely normal, you will receive your results only by: [place] (if you have MyChart) OR A paper copy in the mail If you have any lab test that is abnormal or we need to change your treatment, we will call you to review the results.   Testing/Procedures: - none ordered   Follow-Up: At [facility], you and your health needs are our priority.  As part of our continuing mission to provide you with exceptional heart care, we have created designated Provider Care Teams.  These Care Teams include your primary Cardiologist (physician) and Advanced Practice Providers (APPs -  Physician Assistants and Nurse Practitioners) who all work together to provide you with the care you need, when you need it.  We recommend signing up for the patient portal called "MyChart".  Sign up information is provided on this After Visit Summary.  MyChart is used to connect with patients for Virtual Visits (Telemedicine).  Patients are able to view lab/test results, encounter notes, upcoming appointments, etc.  Non-urgent messages can be sent to your provider as well.   To learn more about what you can do with MyChart, go to [email or web address].    Your next appointment:   6 month(s)  The format for your next appointment:   In Person  Provider:   Virl Axe, MD    Other Instructions  1)  Carpel Tunnel Splint

## 2021-05-02 LAB — CUP PACEART INCLINIC DEVICE CHECK
Battery Remaining Longevity: 25 mo
Brady Statistic RA Percent Paced: 97 %
Brady Statistic RV Percent Paced: 0.19 %
Date Time Interrogation Session: 20230214102500
HighPow Impedance: 70.875
Implantable Lead Implant Date: 20160114
Implantable Lead Implant Date: 20160114
Implantable Lead Location: 753859
Implantable Lead Location: 753860
Implantable Lead Model: 7122
Implantable Pulse Generator Implant Date: 20160114
Lead Channel Impedance Value: 400 Ohm
Lead Channel Impedance Value: 400 Ohm
Lead Channel Pacing Threshold Amplitude: 0.75 V
Lead Channel Pacing Threshold Amplitude: 0.75 V
Lead Channel Pacing Threshold Amplitude: 1.25 V
Lead Channel Pacing Threshold Amplitude: 1.25 V
Lead Channel Pacing Threshold Pulse Width: 0.5 ms
Lead Channel Pacing Threshold Pulse Width: 0.5 ms
Lead Channel Pacing Threshold Pulse Width: 0.6 ms
Lead Channel Pacing Threshold Pulse Width: 0.6 ms
Lead Channel Sensing Intrinsic Amplitude: 10.2 mV
Lead Channel Sensing Intrinsic Amplitude: 2.9 mV
Lead Channel Setting Pacing Amplitude: 1.25 V
Lead Channel Setting Pacing Amplitude: 1.625
Lead Channel Setting Pacing Pulse Width: 0.6 ms
Lead Channel Setting Sensing Sensitivity: 0.5 mV
Pulse Gen Serial Number: 7241695

## 2021-05-02 LAB — MAGNESIUM: Magnesium: 2.1 mg/dL (ref 1.6–2.3)

## 2021-05-04 NOTE — Progress Notes (Signed)
Remote ICD transmission.   

## 2021-05-22 ENCOUNTER — Encounter: Payer: Medicare Other | Admitting: Internal Medicine

## 2021-05-23 ENCOUNTER — Other Ambulatory Visit: Payer: Self-pay | Admitting: Internal Medicine

## 2021-05-28 DIAGNOSIS — E032 Hypothyroidism due to medicaments and other exogenous substances: Secondary | ICD-10-CM | POA: Diagnosis not present

## 2021-05-28 DIAGNOSIS — E785 Hyperlipidemia, unspecified: Secondary | ICD-10-CM | POA: Diagnosis not present

## 2021-05-29 ENCOUNTER — Other Ambulatory Visit: Payer: Self-pay | Admitting: Internal Medicine

## 2021-05-29 NOTE — Telephone Encounter (Signed)
Pt last saw Dr Caryl Comes 05/01/21, last labs 02/12/21 Creat 1.20, age 76, weight 104.1kg, based on specified criteria pt is on appropriate dosage of Eliquis '5mg'$  BID for afib.  Will refill rx.  ?

## 2021-05-30 DIAGNOSIS — E032 Hypothyroidism due to medicaments and other exogenous substances: Secondary | ICD-10-CM | POA: Diagnosis not present

## 2021-05-30 DIAGNOSIS — G4489 Other headache syndrome: Secondary | ICD-10-CM | POA: Diagnosis not present

## 2021-05-30 DIAGNOSIS — M5412 Radiculopathy, cervical region: Secondary | ICD-10-CM | POA: Diagnosis not present

## 2021-05-30 DIAGNOSIS — J449 Chronic obstructive pulmonary disease, unspecified: Secondary | ICD-10-CM | POA: Diagnosis not present

## 2021-05-30 DIAGNOSIS — E749 Disorder of carbohydrate metabolism, unspecified: Secondary | ICD-10-CM | POA: Diagnosis not present

## 2021-05-30 DIAGNOSIS — E785 Hyperlipidemia, unspecified: Secondary | ICD-10-CM | POA: Diagnosis not present

## 2021-05-30 DIAGNOSIS — I5022 Chronic systolic (congestive) heart failure: Secondary | ICD-10-CM | POA: Diagnosis not present

## 2021-06-30 ENCOUNTER — Other Ambulatory Visit: Payer: Self-pay | Admitting: Internal Medicine

## 2021-07-25 ENCOUNTER — Telehealth: Payer: Self-pay | Admitting: Internal Medicine

## 2021-07-25 MED ORDER — APIXABAN 5 MG PO TABS: 5.0000 mg | ORAL_TABLET | Freq: Two times a day (BID) | ORAL | 1 refills | Status: DC

## 2021-07-25 NOTE — Telephone Encounter (Signed)
Prescription refill request for Eliquis received. ?Indication: Atrial Fib ?Last office visit: 05/01/21  Olin Pia MD ?Scr: 1.20 on 02/12/21 ?Age: 76 ?Weight: 104.1kg ? ?Based on above findings Eliquis '5mg'$  twice daily is the appropriate dose.  Refill approved. ? ?

## 2021-07-25 NOTE — Telephone Encounter (Signed)
Refill request

## 2021-07-25 NOTE — Telephone Encounter (Signed)
?*  STAT* If patient is at the pharmacy, call can be transferred to refill team. ? ? ?1. Which medications need to be refilled? (please list name of each medication and dose if known) ELIQUIS 5 MG TABS tablet ? ?2. Which pharmacy/location (including street and city if local pharmacy) is medication to be sent to? [facility] [place], [place] ? ?3. Do they need a 30 day or 90 day supply? 90 ? ?Pt states pharmacy needs this called in and pt also wants to know why RX is needing to be sent in again and not just readily available per usual.   ?

## 2021-07-30 ENCOUNTER — Ambulatory Visit (INDEPENDENT_AMBULATORY_CARE_PROVIDER_SITE_OTHER): Payer: Medicare Other

## 2021-07-30 DIAGNOSIS — I469 Cardiac arrest, cause unspecified: Secondary | ICD-10-CM

## 2021-08-01 LAB — CUP PACEART REMOTE DEVICE CHECK
Battery Remaining Longevity: 22 mo
Battery Remaining Percentage: 24 %
Battery Voltage: 2.8 V
Brady Statistic AP VP Percent: 1 %
Brady Statistic AP VS Percent: 99 %
Brady Statistic AS VP Percent: 1 %
Brady Statistic AS VS Percent: 1 %
Brady Statistic RA Percent Paced: 98 %
Brady Statistic RV Percent Paced: 1 %
Date Time Interrogation Session: 20230515020016
HighPow Impedance: 75 Ohm
HighPow Impedance: 75 Ohm
Implantable Lead Implant Date: 20160114
Implantable Lead Implant Date: 20160114
Implantable Lead Location: 753859
Implantable Lead Location: 753860
Implantable Lead Model: 7122
Implantable Pulse Generator Implant Date: 20160114
Lead Channel Impedance Value: 350 Ohm
Lead Channel Impedance Value: 360 Ohm
Lead Channel Pacing Threshold Amplitude: 0.625 V
Lead Channel Pacing Threshold Amplitude: 1.125 V
Lead Channel Pacing Threshold Pulse Width: 0.5 ms
Lead Channel Pacing Threshold Pulse Width: 0.6 ms
Lead Channel Sensing Intrinsic Amplitude: 2.1 mV
Lead Channel Sensing Intrinsic Amplitude: 6.4 mV
Lead Channel Setting Pacing Amplitude: 1.375
Lead Channel Setting Pacing Amplitude: 1.625
Lead Channel Setting Pacing Pulse Width: 0.6 ms
Lead Channel Setting Sensing Sensitivity: 0.5 mV
Pulse Gen Serial Number: 7241695

## 2021-08-21 NOTE — Progress Notes (Signed)
Remote ICD transmission.   

## 2021-09-26 DIAGNOSIS — E749 Disorder of carbohydrate metabolism, unspecified: Secondary | ICD-10-CM | POA: Diagnosis not present

## 2021-09-26 DIAGNOSIS — E785 Hyperlipidemia, unspecified: Secondary | ICD-10-CM | POA: Diagnosis not present

## 2021-09-26 DIAGNOSIS — R7301 Impaired fasting glucose: Secondary | ICD-10-CM | POA: Diagnosis not present

## 2021-09-28 DIAGNOSIS — J301 Allergic rhinitis due to pollen: Secondary | ICD-10-CM | POA: Diagnosis not present

## 2021-09-28 DIAGNOSIS — M5412 Radiculopathy, cervical region: Secondary | ICD-10-CM | POA: Diagnosis not present

## 2021-09-28 DIAGNOSIS — G4489 Other headache syndrome: Secondary | ICD-10-CM | POA: Diagnosis not present

## 2021-09-28 DIAGNOSIS — E749 Disorder of carbohydrate metabolism, unspecified: Secondary | ICD-10-CM | POA: Diagnosis not present

## 2021-09-28 DIAGNOSIS — J449 Chronic obstructive pulmonary disease, unspecified: Secondary | ICD-10-CM | POA: Diagnosis not present

## 2021-09-28 DIAGNOSIS — E032 Hypothyroidism due to medicaments and other exogenous substances: Secondary | ICD-10-CM | POA: Diagnosis not present

## 2021-09-28 DIAGNOSIS — I5022 Chronic systolic (congestive) heart failure: Secondary | ICD-10-CM | POA: Diagnosis not present

## 2021-09-28 DIAGNOSIS — Z0001 Encounter for general adult medical examination with abnormal findings: Secondary | ICD-10-CM | POA: Diagnosis not present

## 2021-09-28 DIAGNOSIS — E785 Hyperlipidemia, unspecified: Secondary | ICD-10-CM | POA: Diagnosis not present

## 2021-09-28 DIAGNOSIS — I10 Essential (primary) hypertension: Secondary | ICD-10-CM | POA: Diagnosis not present

## 2021-10-03 ENCOUNTER — Other Ambulatory Visit: Payer: Self-pay | Admitting: Internal Medicine

## 2021-10-23 ENCOUNTER — Encounter: Payer: Self-pay | Admitting: *Deleted

## 2021-10-29 ENCOUNTER — Ambulatory Visit: Payer: Medicare Other

## 2021-10-30 ENCOUNTER — Ambulatory Visit (INDEPENDENT_AMBULATORY_CARE_PROVIDER_SITE_OTHER): Payer: Medicare Other | Admitting: Internal Medicine

## 2021-10-30 ENCOUNTER — Other Ambulatory Visit
Admission: RE | Admit: 2021-10-30 | Discharge: 2021-10-30 | Disposition: A | Discharge status: Home / Self Care | Payer: Medicare Other | Source: Ambulatory Visit | Attending: Internal Medicine | Admitting: Internal Medicine

## 2021-10-30 ENCOUNTER — Encounter: Payer: Self-pay | Admitting: Internal Medicine

## 2021-10-30 VITALS — BP 88/56 | HR 60 | Ht 66.0 in | Wt 227.0 lb

## 2021-10-30 DIAGNOSIS — I428 Other cardiomyopathies: Secondary | ICD-10-CM

## 2021-10-30 DIAGNOSIS — Z95810 Presence of automatic (implantable) cardiac defibrillator: Secondary | ICD-10-CM

## 2021-10-30 DIAGNOSIS — I480 Paroxysmal atrial fibrillation: Secondary | ICD-10-CM

## 2021-10-30 DIAGNOSIS — I472 Ventricular tachycardia, unspecified: Secondary | ICD-10-CM | POA: Diagnosis not present

## 2021-10-30 DIAGNOSIS — I469 Cardiac arrest, cause unspecified: Secondary | ICD-10-CM

## 2021-10-30 DIAGNOSIS — Z79899 Other long term (current) drug therapy: Secondary | ICD-10-CM

## 2021-10-30 DIAGNOSIS — I5022 Chronic systolic (congestive) heart failure: Secondary | ICD-10-CM | POA: Diagnosis not present

## 2021-10-30 LAB — BASIC METABOLIC PANEL
Anion gap: 6 (ref 5–15)
BUN: 22 mg/dL (ref 8–23)
CO2: 27 mmol/L (ref 22–32)
Calcium: 9.4 mg/dL (ref 8.9–10.3)
Chloride: 106 mmol/L (ref 98–111)
Creatinine, Ser: 1.43 mg/dL — ABNORMAL HIGH (ref 0.61–1.24)
GFR, Estimated: 50 mL/min — ABNORMAL LOW (ref >60)
Glucose, Bld: 108 mg/dL — ABNORMAL HIGH (ref 70–99)
Potassium: 4.5 mmol/L (ref 3.5–5.1)
Sodium: 139 mmol/L (ref 135–145)

## 2021-10-30 LAB — CUP PACEART REMOTE DEVICE CHECK
Battery Remaining Longevity: 17 mo
Battery Remaining Percentage: 19 %
Battery Voltage: 2.77 V
Brady Statistic AP VP Percent: 1 %
Brady Statistic AP VS Percent: 99 %
Brady Statistic AS VP Percent: 1 %
Brady Statistic AS VS Percent: 1 %
Brady Statistic RA Percent Paced: 97 %
Brady Statistic RV Percent Paced: 1 %
Date Time Interrogation Session: 20230814020020
HighPow Impedance: 75 Ohm
HighPow Impedance: 75 Ohm
Implantable Lead Implant Date: 20160114
Implantable Lead Implant Date: 20160114
Implantable Lead Location: 753859
Implantable Lead Location: 753860
Implantable Lead Model: 7122
Implantable Pulse Generator Implant Date: 20160114
Lead Channel Impedance Value: 350 Ohm
Lead Channel Impedance Value: 400 Ohm
Lead Channel Pacing Threshold Amplitude: 0.625 V
Lead Channel Pacing Threshold Amplitude: 1 V
Lead Channel Pacing Threshold Pulse Width: 0.5 ms
Lead Channel Pacing Threshold Pulse Width: 0.6 ms
Lead Channel Sensing Intrinsic Amplitude: 3.1 mV
Lead Channel Sensing Intrinsic Amplitude: 7 mV
Lead Channel Setting Pacing Amplitude: 1.25 V
Lead Channel Setting Pacing Amplitude: 1.625
Lead Channel Setting Pacing Pulse Width: 0.6 ms
Lead Channel Setting Sensing Sensitivity: 0.5 mV
Pulse Gen Serial Number: 7241695

## 2021-10-30 LAB — CORTISOL: Cortisol, Plasma: 5.9 ug/dL

## 2021-10-30 LAB — CBC WITH DIFFERENTIAL/PLATELET
Abs Immature Granulocytes: 0.01 10*3/uL (ref 0.00–0.07)
Basophils Absolute: 0.1 10*3/uL (ref 0.0–0.1)
Basophils Relative: 1 %
Eosinophils Absolute: 0.3 10*3/uL (ref 0.0–0.5)
Eosinophils Relative: 4 %
HCT: 38.3 % — ABNORMAL LOW (ref 39.0–52.0)
Hemoglobin: 12.8 g/dL — ABNORMAL LOW (ref 13.0–17.0)
Immature Granulocytes: 0 %
Lymphocytes Relative: 18 %
Lymphs Abs: 1.3 10*3/uL (ref 0.7–4.0)
MCH: 31.9 pg (ref 26.0–34.0)
MCHC: 33.4 g/dL (ref 30.0–36.0)
MCV: 95.5 fL (ref 80.0–100.0)
Monocytes Absolute: 0.6 10*3/uL (ref 0.1–1.0)
Monocytes Relative: 8 %
Neutro Abs: 4.9 10*3/uL (ref 1.7–7.7)
Neutrophils Relative %: 69 %
Platelets: 217 10*3/uL (ref 150–400)
RBC: 4.01 MIL/uL — ABNORMAL LOW (ref 4.22–5.81)
RDW: 12.5 % (ref 11.5–15.5)
WBC: 7.1 10*3/uL (ref 4.0–10.5)
nRBC: 0 % (ref 0.0–0.2)

## 2021-10-30 LAB — PACEMAKER DEVICE OBSERVATION

## 2021-10-30 LAB — MAGNESIUM: Magnesium: 2.4 mg/dL (ref 1.7–2.4)

## 2021-10-30 MED ORDER — CARVEDILOL 6.25 MG PO TABS: 6.2500 mg | ORAL_TABLET | Freq: Two times a day (BID) | ORAL | 1 refills | Status: DC

## 2021-10-30 MED ORDER — SPIRONOLACTONE 25 MG PO TABS: 12.5000 mg | ORAL_TABLET | Freq: Every day | ORAL | 1 refills | Status: DC

## 2021-10-30 NOTE — Progress Notes (Signed)
Patient Care Team: Jodi Marble, MD as PCP - General (Internal Medicine) Deboraha Sprang, MD as PCP - Cardiology (Cardiology)   HPI  Ethan Furnace Peretz Thieme. is a 76 y.o. 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    On sotalol.   Atrial fibrillation-persistent, anticoagulation w Apixoban--no bleeding.  Eval for Cardiac Arrest Flecainide challenge was negative ECG  Low Volts R-V1 with RAD with mild RA enlargement SAECG not done as he was in AFib RVR and rate related RBBB Abnormal thallium scan suggesting sarcoid   The patient denies chest pain, shortness of breath, nocturnal dyspnea, orthopnea or peripheral edema.  There have been no palpitations or syncope.  Complains of lightheadedness and fatigue.   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 No ischemia  2/18 ECHO  40-45 %   7/20 TPP scan  Unlikely TTR amyloid  7/20 Echo  35-40% PA press est 35          Antiarrhythmics Date Reason stopped  amiodarone  /  Optic neuritis  Sotalol  4/20         Past Medical History:  Diagnosis Date   Actinic keratosis    AICD (automatic cardioverter/defibrillator) present 03/31/2014   a. 03/2014 s/p SJM 2411-36C dual chamber AICD (serial Number [id number])- followed by Dr. Caryl Comes   Arthritis    a. L  knee.   Asthma    Basal cell carcinoma 08/29/2008   Vertex scalp. Keratotic pattern, ulcerated.   Basal cell carcinoma 01/15/2018   Left distal medial thigh near knee. Fibroepithelioma of pinkus type   Basal cell carcinoma 01/15/2018   Right distal lat. nose ant. inferior edge. Nodular pattern.   Cardiac arrest ([place])    a. 03/30/2014 VF arrest in setting of NICM >> CPR/defib in community >> s/p dual chamber AICD   Coronary artery disease, non-occlusive    a. cath 03/2014 at [facility]: no sig CAD (OM1 30%, CFX 30%), EF 35%   Dysplastic nevus 01/31/2020   L mid back paraspinal - severe, excision 03/21/2020   HLD (hyperlipidemia)    HTN (hypertension)    Kidney stones    Melanoma ([place])    Melanoma resected from scalp   NICM (nonischemic cardiomyopathy) ([place])    a. 03/2014 Echo:  Inferolateral and lateral HK, EF 68-34%, grade 1 diastolic dysfunction, mild MR, mild LAE, mild RAE, trivial TR, no effusion; 09/2018 Echo: EF 35-40%, impaired  relaxation, glob HK, sev apical HK. Nl RV fxn. RVSP 53mHg. Mildly dil LA.   [place]oxysmal [place] 03/28/2015   Retinal artery occlusion     Past Surgical History:  Procedure Laterality Date   adenomatous polyps     CARDIAC CATHETERIZATION  03/30/2014   CARDIOVERSION  03/30/2014   COLONOSCOPY WITH PROPOFOL N/A 03/24/2017   Procedure: COLONOSCOPY WITH PROPOFOL;  Surgeon: SLollie Sails MD;  [facility];  Service: Endoscopy;  Laterality: N/A;   COLONOSCOPY WITH PROPOFOL N/A 01/07/2019   Procedure: COLONOSCOPY WITH PROPOFOL;  Surgeon: Toledo, TBenay Pike MD;  Location: [facility] ENDOSCOPY;  Service: Gastroenterology;  Laterality: N/A;   CYSTOSCOPY W/ LITHOLAPAXY / EHL     IMPLANTABLE CARDIOVERTER DEFIBRILLATOR IMPLANT N/A 03/31/2014   Procedure: IMPLANTABLE CARDIOVERTER DEFIBRILLATOR IMPLANT;  Surgeon: GEvans Lance MD;  [facility];  Service: Cardiovascular;  Laterality: N/A;   JOINT REPLACEMENT     TONSILLECTOMY     "as a  kid"   TOTAL KNEE ARTHROPLASTY Left ~ 2010   TOTAL KNEE ARTHROPLASTY WITH REVISION COMPONENTS Left ~ 2011   VARICOSE VEIN SURGERY Left ~ 2014    Current Outpatient Medications  Medication Sig Dispense Refill   apixaban (ELIQUIS) 5 MG TABS tablet Take 1 tablet (5 mg total) by mouth 2 (two) times daily. 180 tablet 1   atorvastatin (LIPITOR) 10 MG tablet Take 1 tablet (10 mg total) by mouth daily. 90 tablet 3   carvedilol (COREG) 25 MG tablet Take 25 mg by mouth 2 (two) times daily with a meal.     fluticasone-salmeterol (WIXELA INHUB) 250-50 MCG/ACT AEPB Inhale 1 puff into the lungs in the morning and at bedtime.     furosemide (LASIX) 40 MG tablet TAKE 1 TABLET BY MOUTH TWICE A DAY 2 DAYS A WEEK AND 1 TABLET ONCE A DAY ALL OTHER DAYS. 108 tablet 3   levothyroxine (SYNTHROID, LEVOTHROID) 75 MCG tablet Take 75 mcg by mouth daily.  2   losartan (COZAAR) 25 MG tablet Take 1 tablet (25 mg total) by mouth daily. 90 tablet 3   sotalol (BETAPACE) 80 MG tablet TAKE 1 TABLET BY MOUTH TWICE A DAY 180 tablet 3   spironolactone (ALDACTONE) 25 MG tablet TAKE 1 TABLET (25 MG TOTAL) BY MOUTH DAILY. 90 tablet 3   gabapentin (NEURONTIN) 100 MG capsule Take 100 mg by mouth 3 (three) times daily. (Patient not taking: Reported on 05/01/2021)     No current facility-administered medications for this visit.   Facility-Administered Medications Ordered in Other Visits  Medication Dose Route Frequency Provider Last Rate Last Admin   technetium tetrofosmin (TC-MYOVIEW) injection 216.1millicurie  209.6millicurie Intravenous Once PRN RFay Records MD        Allergies  Allergen Reactions   EDelene Loll[Sacubitril-Valsartan]     hallucinations    Review of Systems negative except from HPI and PMH  Physical Exam  BP (!) 88/56 (BP Location: Left Arm, Patient Position: Sitting, Cuff Size: Large) Comment: verified with Dynamap  Pulse 60   Ht '5\' 6"'$  (1.676 m)   Wt 227 lb (103 kg)   SpO2 97%   BMI 36.64 kg/m  Well  developed and well nourished in no acute distress HENT normal Neck supple with JVP<10 Clear Device pocket well healed; without hematoma or erythema.  There is no tethering  Regular rate and rhythm, no  gallop No  murmur Abd-soft with active BS No Clubbing cyanosis  edema Skin-warm and dry A &  Oriented  Grossly normal sensory and motor function  ECG atrial pacing at 60 Interval 24/08/44 Low voltage  Assessment and  Plan  Aborted Cardiac arrest//FHx Cardiac Arrest   Nonischemic cardiomyopathy  ICD  St Jude       Hypertension now hypotension  Ventricular tachycardia sustained/syncope  High Risk Medication Surveillance-sotalol  Sinus bradycardia/chronotropic incompetence  Low Voltage ECG  iRBBB LAFB  Atrial Fibrillation persistent   Congestive heart failure- acute/ chronic-systolic/diastolic  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    No interval ventricular tachycardia.  Continue him on sotalol 80 twice daily.  Need to check sotalol surveillance laboratories No interval atrial fibrillation.  Continue on sotalol; no bleeding we will continue his Eliquis at 5 mg twice daily  Blood pressure continues to decrease.  We will decrease his carvedilol from 25--6.25 twice daily and his spironolactone from 25--12.5 I will switch it to take at at bedtime.  We will check a cortisol level as well as metabolic profile  Cost of these drugs is an issue.  I have given the name of cost drugs plus.com as a website we also given the name of some [place] websites to see if he can get his inhaler and Eliquis and less prohibitive price.  We will also have him reapply for patient assistance.  Euvolemic continue his furosemide as outlined above 40 mg daily with twice daily on 2 days

## 2021-10-30 NOTE — Patient Instructions (Signed)
Medication Instructions:  Your physician has recommended you make the following change in your medication:   REDUCE Carvedilol to 6.25 mg twice a day. An Rx has been sent to your pharmacy  REDUCE Spironolactone to 12.5 mg daily at bedtime. An Rx has been sent to your pharmacy  *If you need a refill on your cardiac medications before your next appointment, please call your pharmacy*   Lab Work: Bmp, Cbc, Mag, Cortisol today  Please have your labs drawn at the [facility]. Stop at the Registration desk to check in.  If you have labs (blood work) drawn today and your tests are completely normal, you will receive your results only by: [place] (if you have MyChart) OR A paper copy in the mail If you have any lab test that is abnormal or we need to change your treatment, we will call you to review the results.   Testing/Procedures: None ordered   Follow-Up: At [facility], you and your health needs are our priority.  As part of our continuing mission to provide you with exceptional heart care, we have created designated Provider Care Teams.  These Care Teams include your primary Cardiologist (physician) and Advanced Practice Providers (APPs -  Physician Assistants and Nurse Practitioners) who all work together to provide you with the care you need, when you need it.  We recommend signing up for the patient portal called "MyChart".  Sign up information is provided on this After Visit Summary.  MyChart is used to connect with patients for Virtual Visits (Telemedicine).  Patients are able to view lab/test results, encounter notes, upcoming appointments, etc.  Non-urgent messages can be sent to your provider as well.   To learn more about what you can do with MyChart, go to [email or web address].    Your next appointment:   6 month(s)  The format for your next appointment:   In Person  Provider:   Virl Axe, MD   Other Instructions N/A  Important Information  About [place]

## 2021-11-02 ENCOUNTER — Encounter: Payer: Self-pay | Admitting: *Deleted

## 2021-11-02 ENCOUNTER — Encounter: Payer: Self-pay | Admitting: Internal Medicine

## 2021-11-02 NOTE — Progress Notes (Signed)
Letter  

## 2021-11-23 ENCOUNTER — Telehealth: Payer: Self-pay | Admitting: Internal Medicine

## 2021-11-23 MED ORDER — APIXABAN 5 MG PO TABS: 5.0000 mg | ORAL_TABLET | Freq: Two times a day (BID) | ORAL | 3 refills | Status: DC

## 2021-11-23 NOTE — Telephone Encounter (Signed)
Patient's daughter states they received a letter about [place] pharmacies and she needs more information about how to get it started.

## 2021-11-23 NOTE — Telephone Encounter (Signed)
I called and spoke with the patient's daughter, Strader, regarding the [employer]. I have advised her that the information I sent to the patient, is information we have been given from other patient's who have had success with obtaining their drugs from [place].    1) [place] [facility]  Website: [email or web address]   2) [facility]  Website: [email or web address]  Phone: [phone]  Fax: [phone]  Email: [email or web address]   I advised Banke, that I am not certain of the actual process they used to establish obtaining their drugs. I have asked her to please research the websites listed above.   She inquired if the drugs are run through insurance in [place]. I advised I am not certain of this either- she will contact the patient's insurance company.  She then asked if I could email her a copy of the RX and I advised due to [employer] I could not email this.   She is aware I can print a RX and have Dr. Caryl Comes sign this for pick up in the office. She is aware that Dr. Caryl Comes will be gone for the next 2 weeks and that I will call her back as soon as this is signed.  Schoenberger voices understanding and was appreciative of the call back.

## 2021-11-25 ENCOUNTER — Other Ambulatory Visit: Payer: Self-pay | Admitting: Nurse Practitioner

## 2021-12-11 NOTE — Telephone Encounter (Signed)
Dr. Caryl Comes has signed the patient's Eliquis RX.  I have called the patient's daughter, Cruey, and advised her this is ready for pick up in our office.  [place] voices understanding and is agreeable.

## 2022-01-01 DIAGNOSIS — E032 Hypothyroidism due to medicaments and other exogenous substances: Secondary | ICD-10-CM | POA: Diagnosis not present

## 2022-01-01 DIAGNOSIS — R7301 Impaired fasting glucose: Secondary | ICD-10-CM | POA: Diagnosis not present

## 2022-01-01 DIAGNOSIS — I10 Essential (primary) hypertension: Secondary | ICD-10-CM | POA: Diagnosis not present

## 2022-01-01 DIAGNOSIS — E749 Disorder of carbohydrate metabolism, unspecified: Secondary | ICD-10-CM | POA: Diagnosis not present

## 2022-01-07 DIAGNOSIS — I5022 Chronic systolic (congestive) heart failure: Secondary | ICD-10-CM | POA: Diagnosis not present

## 2022-01-07 DIAGNOSIS — E749 Disorder of carbohydrate metabolism, unspecified: Secondary | ICD-10-CM | POA: Diagnosis not present

## 2022-01-07 DIAGNOSIS — J301 Allergic rhinitis due to pollen: Secondary | ICD-10-CM | POA: Diagnosis not present

## 2022-01-07 DIAGNOSIS — I10 Essential (primary) hypertension: Secondary | ICD-10-CM | POA: Diagnosis not present

## 2022-01-07 DIAGNOSIS — N178 Other acute kidney failure: Secondary | ICD-10-CM | POA: Diagnosis not present

## 2022-01-07 DIAGNOSIS — E032 Hypothyroidism due to medicaments and other exogenous substances: Secondary | ICD-10-CM | POA: Diagnosis not present

## 2022-01-07 DIAGNOSIS — M5412 Radiculopathy, cervical region: Secondary | ICD-10-CM | POA: Diagnosis not present

## 2022-01-07 DIAGNOSIS — B372 Candidiasis of skin and nail: Secondary | ICD-10-CM | POA: Diagnosis not present

## 2022-01-07 DIAGNOSIS — B356 Tinea cruris: Secondary | ICD-10-CM | POA: Diagnosis not present

## 2022-01-07 DIAGNOSIS — G4489 Other headache syndrome: Secondary | ICD-10-CM | POA: Diagnosis not present

## 2022-01-07 DIAGNOSIS — E785 Hyperlipidemia, unspecified: Secondary | ICD-10-CM | POA: Diagnosis not present

## 2022-01-15 DIAGNOSIS — N178 Other acute kidney failure: Secondary | ICD-10-CM | POA: Diagnosis not present

## 2022-01-21 ENCOUNTER — Telehealth: Payer: Self-pay | Admitting: Internal Medicine

## 2022-01-21 DIAGNOSIS — I5022 Chronic systolic (congestive) heart failure: Secondary | ICD-10-CM | POA: Diagnosis not present

## 2022-01-21 DIAGNOSIS — E785 Hyperlipidemia, unspecified: Secondary | ICD-10-CM | POA: Diagnosis not present

## 2022-01-21 DIAGNOSIS — B372 Candidiasis of skin and nail: Secondary | ICD-10-CM | POA: Diagnosis not present

## 2022-01-21 DIAGNOSIS — J301 Allergic rhinitis due to pollen: Secondary | ICD-10-CM | POA: Diagnosis not present

## 2022-01-21 DIAGNOSIS — B356 Tinea cruris: Secondary | ICD-10-CM | POA: Diagnosis not present

## 2022-01-21 DIAGNOSIS — G4489 Other headache syndrome: Secondary | ICD-10-CM | POA: Diagnosis not present

## 2022-01-21 DIAGNOSIS — M5412 Radiculopathy, cervical region: Secondary | ICD-10-CM | POA: Diagnosis not present

## 2022-01-21 DIAGNOSIS — E749 Disorder of carbohydrate metabolism, unspecified: Secondary | ICD-10-CM | POA: Diagnosis not present

## 2022-01-21 DIAGNOSIS — E032 Hypothyroidism due to medicaments and other exogenous substances: Secondary | ICD-10-CM | POA: Diagnosis not present

## 2022-01-21 NOTE — Telephone Encounter (Signed)
Patient's daughter is calling to talk with Dr. Caryl Comes or nurse in regards to the generic brand of Eliquis. Please call back

## 2022-01-21 NOTE — Telephone Encounter (Signed)
Daughter called back to confirm that we have her number on file. This is [place] pt.

## 2022-01-21 NOTE — Telephone Encounter (Signed)
I called and spoke with the patient's daughter, Ethan Vega. She advised the patient received his RX for Eliquis from [facility] in [place]. He was concerned about this being generic and what side effects he should look for. I advised [name] that the side effects to look for would be the same as branded Eliquis- increased bleeding risk. I did advise her if the patient noticed any signs/ symptoms of stroke he should report to the ER immediately.   Otherwise, I advised [name], I have not heard of any other patient's with complaints/ complications from Eliquis that they have received from [place].   [name] voices understanding and is agreeable. She will relay this to the patient and was appreciative of the call back.

## 2022-01-21 NOTE — Telephone Encounter (Signed)
Returned the call to the patient's daughter, per the dpr. She was calling to speak with Dr. Olin Pia nurse. Message has been sent.

## 2022-01-28 ENCOUNTER — Telehealth: Payer: Self-pay | Admitting: Internal Medicine

## 2022-01-28 ENCOUNTER — Ambulatory Visit (INDEPENDENT_AMBULATORY_CARE_PROVIDER_SITE_OTHER): Payer: Medicare Other

## 2022-01-28 ENCOUNTER — Telehealth: Payer: Self-pay

## 2022-01-28 DIAGNOSIS — I495 Sick sinus syndrome: Secondary | ICD-10-CM

## 2022-01-28 LAB — CUP PACEART REMOTE DEVICE CHECK
Battery Remaining Longevity: 14 mo
Battery Remaining Percentage: 16 %
Battery Voltage: 2.74 V
Brady Statistic AP VP Percent: 1 %
Brady Statistic AP VS Percent: 98 %
Brady Statistic AS VP Percent: 1 %
Brady Statistic AS VS Percent: 1 %
Brady Statistic RA Percent Paced: 97 %
Brady Statistic RV Percent Paced: 1 %
Date Time Interrogation Session: 20231111020018
HighPow Impedance: 62 Ohm
HighPow Impedance: 62 Ohm
Implantable Lead Connection Status: 753985
Implantable Lead Connection Status: 753985
Implantable Lead Implant Date: 20160114
Implantable Lead Implant Date: 20160114
Implantable Lead Location: 753859
Implantable Lead Location: 753860
Implantable Lead Model: 7122
Implantable Pulse Generator Implant Date: 20160114
Lead Channel Impedance Value: 350 Ohm
Lead Channel Impedance Value: 380 Ohm
Lead Channel Pacing Threshold Amplitude: 0.625 V
Lead Channel Pacing Threshold Amplitude: 1.125 V
Lead Channel Pacing Threshold Pulse Width: 0.5 ms
Lead Channel Pacing Threshold Pulse Width: 0.6 ms
Lead Channel Sensing Intrinsic Amplitude: 3 mV
Lead Channel Sensing Intrinsic Amplitude: 8.5 mV
Lead Channel Setting Pacing Amplitude: 1.375
Lead Channel Setting Pacing Amplitude: 1.625
Lead Channel Setting Pacing Pulse Width: 0.6 ms
Lead Channel Setting Sensing Sensitivity: 0.5 mV
Pulse Gen Serial Number: 7241695

## 2022-01-28 NOTE — Telephone Encounter (Signed)
CV Remote Solution Alert  Scheduled remote reviewed. Normal device function.   1 VT-1, EGM shows AF with NSVT vs rapid ventricular response - ATP delivered x1 - route to triage. 14 NSVT, 2 SVT, EGM's appear AF with rapid ventricular response, brief.  Event 437 & 429 NSVT, no therapy. 1 PMT appropriately treated and terminated 27 AMS, AF, burden <1%, Eliquis. Corvue has been below threshold x17, beginning to trend up Next remote 91 days.   Attempted to contact patient. No answer, LMTCB.

## 2022-01-28 NOTE — Telephone Encounter (Signed)
I spoke with the patient's daughter, Marte. She advised that the patient recently saw Dr. Elijio Miles and was taken off his fluid pills, and these were replaced with something else, but Heider is unsure of what.  She called today stating the patient was concerned about the change in medication. I advised her that in August, the patient was taking: - Lasix 40 mg: 1 tablet twice daily 2 days per week & 1 tablet daily 5 days per week - Spironolactone 25 mg- was decreased to 12.5 mg once daily.  I cannot see Dr. Bailey Mech notes in EPIC, but I did find lab work under [facility] [facility] showing: 01/01/22- creatinine 1.34 01/15/22- creatinine 1.14  I have advised [name] to try to find out what the medication changes actually are so I can update the patient's chart.  She also inquired when his next follow up is and I confirmed her has an appointment in February. However, she thought the patient may not want to wait that long to see Dr. Caryl Comes.  I advised I had a cancellation with Dr. Caryl Comes for 02/26/22 at 10:00 am and will schedule the patient there. She will confirm with the patient if this is ok and also confirm his medications and call me back.  C[place]ton voices understanding of the above and is agreeable. She was very appreciative of the call back.

## 2022-01-28 NOTE — Telephone Encounter (Signed)
Pt daughter asking to speak with Nira Conn about pt prescriptions

## 2022-01-29 ENCOUNTER — Telehealth: Payer: Self-pay | Admitting: Internal Medicine

## 2022-01-29 NOTE — Telephone Encounter (Signed)
I spoke with Angela Nevin. She states the patient was on Carvedilol.  I advised Shearn, that the Carvedilol is not new for the patient. Per his 10/2021 visit with Dr. Caryl Comes, the patient was advised: Medication Instructions:  Your physician has recommended you make the following change in your medication:    REDUCE Carvedilol to 6.25 mg twice a day. An Rx has been sent to your pharmacy   REDUCE Spironolactone to 12.5 mg daily at bedtime. An Rx has been sent to your [place] states the patient is off lasix per his PCP, but he is on spironolactone. He is also reporting a productive cough.  I advised if the he is coughing up anything with color, he should see Dr. Elijio Miles about this. Otherwise, if he is coughing up clear sputum, this could a sign that he may be retaining some fluid.  I have asked Gauthreaux to have the patient weigh daily.  Virani voices understanding of the above and is agreeable.  She was appreciative of the call back.

## 2022-01-29 NOTE — Telephone Encounter (Signed)
Outreach made to Pt.  Per Pt he does not remember any symptoms from January 25, 2022.  Advised would forward to Dr. Caryl Comes for review/recommendation.  At last office visit Pt's carvedilol was reduced from 25 mg PO BID to 6.25 mg PO BID and spironolactone was also reduced.  Pt reports he checks his BP and last few systolic readings are 317, 170 and 170.  Advised will follow up with Dr. Caryl Comes.

## 2022-01-29 NOTE — Telephone Encounter (Signed)
Daughter stated she found out the name of the medicine.  Daughter would like Animal nutritionist to return the call.

## 2022-01-29 NOTE — Telephone Encounter (Signed)
A previous encounter is open for this same issue. Closing this encounter- see 01/28/22 phone note.

## 2022-01-29 NOTE — Telephone Encounter (Signed)
Ethan Vega B at 01/29/2022  9:54 AM  Status: Signed  Daughter stated she found out the name of the medicine.  Daughter would like Animal nutritionist to return the call.

## 2022-01-30 ENCOUNTER — Telehealth: Payer: Self-pay | Admitting: Internal Medicine

## 2022-01-30 MED ORDER — SPIRONOLACTONE 25 MG PO TABS: ORAL_TABLET | ORAL | 1 refills | Status: DC

## 2022-01-30 NOTE — Telephone Encounter (Signed)
I called and spoke with the patient. I advised the him that Dr. Caryl Comes was made aware of his ATP episode on his device and that he had recommended : 1) INCREASING Spironolactone 25 mg: - take 1 whole tablet once daily    I then advised that I had spoken with his daughter, Tiberio, earlier this week and she indicated Dr. Elijio Miles had stopped the patient's lasix ~ mid October due to his kidney function.   The patient advised he was not sure if Dr. Elijio Miles may have done the right thing with his medication, then he told him to drink 6-8 oz of water/ day.  I advised in light of his elevated creatinine, these were appropriate recommendations.   I did ask the patient to pull all of his current medication bottles for [place] to review and make sure he was aware of which medication we were changing.  The patient confirmed he is taking: - Eliquis 5 mg BID - Losartan 25 mg QD - Sotalol 80 mg BID - Cetirizine 10 mg QD - Levothyroxine 75 mcg QD - Atorvastatin 10 mg QD - Fluconazole 200 mg QD - Coreg 6.25 mg BID  (he also had a 25 mg tablet at home, but I advised him to set this aside and not take the 25 mg dose at this time) -  Spironolactone 25 mg- 0.5 tablet QD  We reviewed his medication list twice and he read out each medication to be sure he was taking these appropriately.  The patient also has lasix at home, but is aware to continue to hold this if his PCP stopped it.   He is aware that I will update his pharmacy with the dose change for his spironolactone as he is almost out of this RX.   I have also confirmed with him that in speaking with Ferrone earlier this week, I had made an appointment for him to come in sooner than his original February appointment. The appointment was set for 02/26/22 at 1000- the patient was aware of this appointment and agreeable. The patient was offered sooner follow up with Dr. Caryl Comes on Tuesday 11/21 (8:00 am)  or 11/28 (8:20 am), but he preferred to keep his 12/12  appointment as scheduled.   The patient voices understanding of all of the above and is agreeable. He was very appreciative of the call today.  44 minutes total was spent on the phone with the patient.

## 2022-01-30 NOTE — Telephone Encounter (Signed)
It sounds like we reduced his meds too much  lets have him resume the full dose spiro and see how he does   His BP in the office was 88    Thanks SK

## 2022-01-30 NOTE — Telephone Encounter (Signed)
  Pt's daughter calling, she said the pt called her this morning and he is coughing up yellow stuff. She said Nira Conn know its from the medication pt currently taking

## 2022-01-30 NOTE — Telephone Encounter (Signed)
I called and spoke with the patient's daughter, Fitzgerald (ok per DPR). I informed her that I spoke with the patient earlier today regarding his episode of ATP that the [facility] called him about yesterday. Crear was not aware of this, but we discussed findings from his remote transmission and that Dr. Caryl Comes was adjusting the spironolactone dose (increasing to 25 mg once daily).  Wacha is aware that the patient and I reviewed his medications in depth this morning and he voiced understanding of the changes.   I also advised her that the patient did not mention anything about a productive cough at that time. He was able to speak in complete sentences without sounding SOB and didn't cough during the 45 minutes I spoke with him this morning.  However, I advised Gesner if the patient is having a productive cough with yellow sputum, he should continue to monitor for signs/ symptoms of infection and follow up with Dr. Elijio Miles as needed for this.   Lobdell voices understanding of all of the above and is agreeable.

## 2022-02-01 DIAGNOSIS — H2513 Age-related nuclear cataract, bilateral: Secondary | ICD-10-CM | POA: Diagnosis not present

## 2022-02-15 ENCOUNTER — Telehealth: Payer: Self-pay | Admitting: Internal Medicine

## 2022-02-15 NOTE — Telephone Encounter (Signed)
I called and spoke with the patient's daughter, Owczarzak (ok per DPR). She advised that the patient is having some lower extremity. He has been off of his lasix since ~ mid November at the request of his PCP, due to an elevated renal function.   I advised Kanner, to please reach out to Dr. Bailey Mech office and advise him of the swelling since he d/c'ed the patient's lasix and see if he can restart this at a lower dose/ take a 2-3 day course of lasix to see if this helps.   She is aware that since we have not seen the patient in the office since his lasix was stopped, the decision to resume should come from his PCP.   Mcguire voices understanding of the above and is agreeable.  She is aware that we will see the patient as scheduled on 02/26/22 with Dr. Caryl Comes.

## 2022-02-15 NOTE — Telephone Encounter (Signed)
  Pt c/o swelling: STAT is pt has developed SOB within 24 hours  If swelling, where is the swelling located? One of the legs   How much weight have you gained and in what time span? Not sure   Have you gained 3 pounds in a day or 5 pounds in a week? Not sure  Do you have a log of your daily weights (if so, list)? none  Are you currently taking a fluid pill? No   Are you currently SOB? No   Have you traveled recently? No   Pt's daughter calling, she said, pt told her that since he stopped taking fluid pill one of his legs is swollen, she is not sure which leg and not sure if he gained weight. She would like to speak with RN Nira Conn

## 2022-02-26 ENCOUNTER — Encounter: Payer: Medicare Other | Admitting: Internal Medicine

## 2022-03-13 NOTE — Progress Notes (Signed)
Remote ICD transmission.   

## 2022-04-08 DIAGNOSIS — E785 Hyperlipidemia, unspecified: Secondary | ICD-10-CM | POA: Diagnosis not present

## 2022-04-08 DIAGNOSIS — E749 Disorder of carbohydrate metabolism, unspecified: Secondary | ICD-10-CM | POA: Diagnosis not present

## 2022-04-08 DIAGNOSIS — E032 Hypothyroidism due to medicaments and other exogenous substances: Secondary | ICD-10-CM | POA: Diagnosis not present

## 2022-04-08 DIAGNOSIS — J301 Allergic rhinitis due to pollen: Secondary | ICD-10-CM | POA: Diagnosis not present

## 2022-04-08 DIAGNOSIS — M5412 Radiculopathy, cervical region: Secondary | ICD-10-CM | POA: Diagnosis not present

## 2022-04-08 DIAGNOSIS — I10 Essential (primary) hypertension: Secondary | ICD-10-CM | POA: Diagnosis not present

## 2022-04-08 DIAGNOSIS — B356 Tinea cruris: Secondary | ICD-10-CM | POA: Diagnosis not present

## 2022-04-08 DIAGNOSIS — G4489 Other headache syndrome: Secondary | ICD-10-CM | POA: Diagnosis not present

## 2022-04-08 DIAGNOSIS — B372 Candidiasis of skin and nail: Secondary | ICD-10-CM | POA: Diagnosis not present

## 2022-04-08 DIAGNOSIS — I5022 Chronic systolic (congestive) heart failure: Secondary | ICD-10-CM | POA: Diagnosis not present

## 2022-04-08 DIAGNOSIS — E119 Type 2 diabetes mellitus without complications: Secondary | ICD-10-CM | POA: Diagnosis not present

## 2022-04-22 ENCOUNTER — Other Ambulatory Visit: Payer: Self-pay

## 2022-04-22 MED ORDER — LEVOTHYROXINE SODIUM 100 MCG PO TABS: 100.0000 ug | ORAL_TABLET | Freq: Every day | ORAL | 1 refills | Status: DC

## 2022-04-29 ENCOUNTER — Ambulatory Visit: Payer: Medicare Other

## 2022-04-29 DIAGNOSIS — I495 Sick sinus syndrome: Secondary | ICD-10-CM | POA: Diagnosis not present

## 2022-04-30 ENCOUNTER — Encounter: Payer: Self-pay | Admitting: *Deleted

## 2022-04-30 LAB — CUP PACEART REMOTE DEVICE CHECK
Battery Remaining Longevity: 12 mo
Battery Remaining Percentage: 13 %
Battery Voltage: 2.71 V
Brady Statistic AP VP Percent: 1 %
Brady Statistic AP VS Percent: 99 %
Brady Statistic AS VP Percent: 1 %
Brady Statistic AS VS Percent: 1 %
Brady Statistic RA Percent Paced: 98 %
Brady Statistic RV Percent Paced: 1 %
Date Time Interrogation Session: 20240212020016
HighPow Impedance: 62 Ohm
HighPow Impedance: 62 Ohm
Implantable Lead Connection Status: 753985
Implantable Lead Connection Status: 753985
Implantable Lead Implant Date: 20160114
Implantable Lead Implant Date: 20160114
Implantable Lead Location: 753859
Implantable Lead Location: 753860
Implantable Lead Model: 7122
Implantable Pulse Generator Implant Date: 20160114
Lead Channel Impedance Value: 340 Ohm
Lead Channel Impedance Value: 350 Ohm
Lead Channel Pacing Threshold Amplitude: 0.5 V
Lead Channel Pacing Threshold Amplitude: 1.125 V
Lead Channel Pacing Threshold Pulse Width: 0.5 ms
Lead Channel Pacing Threshold Pulse Width: 0.6 ms
Lead Channel Sensing Intrinsic Amplitude: 1.7 mV
Lead Channel Sensing Intrinsic Amplitude: 8.1 mV
Lead Channel Setting Pacing Amplitude: 1.375
Lead Channel Setting Pacing Amplitude: 1.5 V
Lead Channel Setting Pacing Pulse Width: 0.6 ms
Lead Channel Setting Sensing Sensitivity: 0.5 mV
Pulse Gen Serial Number: 7241695

## 2022-05-02 ENCOUNTER — Ambulatory Visit: Payer: Medicare Other | Admitting: Internal Medicine

## 2022-05-07 ENCOUNTER — Ambulatory Visit: Payer: Medicare Other | Attending: Internal Medicine | Admitting: Internal Medicine

## 2022-05-07 ENCOUNTER — Encounter: Payer: Self-pay | Admitting: Internal Medicine

## 2022-05-07 ENCOUNTER — Other Ambulatory Visit
Admission: RE | Admit: 2022-05-07 | Discharge: 2022-05-07 | Disposition: A | Discharge status: Home / Self Care | Payer: Medicare Other | Source: Ambulatory Visit | Attending: Internal Medicine | Admitting: Internal Medicine

## 2022-05-07 VITALS — BP 100/64 | HR 63 | Ht 66.0 in | Wt 220.2 lb

## 2022-05-07 DIAGNOSIS — I5022 Chronic systolic (congestive) heart failure: Secondary | ICD-10-CM

## 2022-05-07 DIAGNOSIS — I469 Cardiac arrest, cause unspecified: Secondary | ICD-10-CM | POA: Diagnosis not present

## 2022-05-07 DIAGNOSIS — I495 Sick sinus syndrome: Secondary | ICD-10-CM | POA: Diagnosis not present

## 2022-05-07 DIAGNOSIS — Z95810 Presence of automatic (implantable) cardiac defibrillator: Secondary | ICD-10-CM | POA: Diagnosis not present

## 2022-05-07 DIAGNOSIS — I472 Ventricular tachycardia, unspecified: Secondary | ICD-10-CM | POA: Diagnosis not present

## 2022-05-07 DIAGNOSIS — Z79899 Other long term (current) drug therapy: Secondary | ICD-10-CM | POA: Diagnosis not present

## 2022-05-07 DIAGNOSIS — I428 Other cardiomyopathies: Secondary | ICD-10-CM | POA: Diagnosis not present

## 2022-05-07 LAB — CUP PACEART INCLINIC DEVICE CHECK
Battery Remaining Longevity: 12 mo
Brady Statistic RA Percent Paced: 98 %
Brady Statistic RV Percent Paced: 0.25 %
Date Time Interrogation Session: 20240220093401
HighPow Impedance: 61.875
Implantable Lead Connection Status: 753985
Implantable Lead Connection Status: 753985
Implantable Lead Implant Date: 20160114
Implantable Lead Implant Date: 20160114
Implantable Lead Location: 753859
Implantable Lead Location: 753860
Implantable Lead Model: 7122
Implantable Pulse Generator Implant Date: 20160114
Lead Channel Impedance Value: 350 Ohm
Lead Channel Impedance Value: 375 Ohm
Lead Channel Pacing Threshold Amplitude: 0.75 V
Lead Channel Pacing Threshold Amplitude: 0.75 V
Lead Channel Pacing Threshold Amplitude: 1.25 V
Lead Channel Pacing Threshold Amplitude: 1.25 V
Lead Channel Pacing Threshold Pulse Width: 0.5 ms
Lead Channel Pacing Threshold Pulse Width: 0.5 ms
Lead Channel Pacing Threshold Pulse Width: 0.6 ms
Lead Channel Pacing Threshold Pulse Width: 0.6 ms
Lead Channel Sensing Intrinsic Amplitude: 2.6 mV
Lead Channel Sensing Intrinsic Amplitude: 9.7 mV
Lead Channel Setting Pacing Amplitude: 1.375
Lead Channel Setting Pacing Amplitude: 1.625
Lead Channel Setting Pacing Pulse Width: 0.6 ms
Lead Channel Setting Sensing Sensitivity: 0.5 mV
Pulse Gen Serial Number: 7241695

## 2022-05-07 LAB — COMPREHENSIVE METABOLIC PANEL
ALT: 23 U/L (ref 0–44)
AST: 31 U/L (ref 15–41)
Albumin: 4 g/dL (ref 3.5–5.0)
Alkaline Phosphatase: 54 U/L (ref 38–126)
Anion gap: 8 (ref 5–15)
BUN: 25 mg/dL — ABNORMAL HIGH (ref 8–23)
CO2: 26 mmol/L (ref 22–32)
Calcium: 9.5 mg/dL (ref 8.9–10.3)
Chloride: 104 mmol/L (ref 98–111)
Creatinine, Ser: 1.1 mg/dL (ref 0.61–1.24)
GFR, Estimated: >60 mL/min (ref >60)
Glucose, Bld: 101 mg/dL — ABNORMAL HIGH (ref 70–99)
Potassium: 4.3 mmol/L (ref 3.5–5.1)
Sodium: 138 mmol/L (ref 135–145)
Total Bilirubin: 2.4 mg/dL — ABNORMAL HIGH (ref 0.3–1.2)
Total Protein: 6.9 g/dL (ref 6.5–8.1)

## 2022-05-07 LAB — CBC
HCT: 39.2 % (ref 39.0–52.0)
Hemoglobin: 12.8 g/dL — ABNORMAL LOW (ref 13.0–17.0)
MCH: 31.3 pg (ref 26.0–34.0)
MCHC: 32.7 g/dL (ref 30.0–36.0)
MCV: 95.8 fL (ref 80.0–100.0)
Platelets: 198 10*3/uL (ref 150–400)
RBC: 4.09 MIL/uL — ABNORMAL LOW (ref 4.22–5.81)
RDW: 13.3 % (ref 11.5–15.5)
WBC: 6.6 10*3/uL (ref 4.0–10.5)
nRBC: 0 % (ref 0.0–0.2)

## 2022-05-07 LAB — MAGNESIUM: Magnesium: 2 mg/dL (ref 1.7–2.4)

## 2022-05-07 NOTE — Progress Notes (Signed)
Patient Care Team: Jodi Marble, MD as PCP - General (Internal Medicine) Deboraha Sprang, MD as PCP - Cardiology (Cardiology)   HPI  Ethan Furnace Siegfried Merrin. is a 77 y.o. 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    On sotalol.   Atrial fibrillation-persistent, anticoagulation w Apixoban-no bleeding.  Eval for Cardiac Arrest Flecainide challenge was negative ECG  Low Volts R-V1 with RAD with mild RA enlargement SAECG not done as he was in AFib RVR and rate related RBBB Abnormal thallium scan suggesting sarcoid   The patient denies chest pain, nocturnal dyspnea, orthopnea.  There have been no palpitations, lightheadedness or syncope.  Complains of edema and shortness of breath .    Struggling with paying for his medicines  discussed [facility] and [facility]      Ethan Vega, age 44, HTN-1 DM -1 for CHADSVAS score >= 6                Date Cr K Hgb  Mg  1/17       8/17  0.82  14.4   5/18 0.87  13.2   12/18 1.43 4.0 14.3   5/19 0.83 4.0 14.2   3/20 1.09 3.7 13.4   5/20 1.10 4.3  2.3  10/20 0.94 3.9    3/21 1.14 4.1 13.4 2.0  9/21 1.00 4.2 13.1 2.3  5/22 1.08 4.4 13.3 (3/22) 2.2 (3/22)   11/22 1.2 4.2 13.7   8/23 1.43 4.5 12.8 2.4       DATE TEST EF   1/16 Cath  30-35%  CA normal   4/16  Echo   45-50 %   5/16 myoview    prior infarct?? No ischemia  2/18 ECHO  40-45 %   7/20 TPP scan  Unlikely TTR amyloid  7/20 Echo  35-40% PA press est 35          Antiarrhythmics Date Reason stopped  amiodarone  /  Optic neuritis  Sotalol  4/20         Past Medical History:  Diagnosis Date   Actinic keratosis    AICD (automatic cardioverter/defibrillator) present 03/31/2014   a. 03/2014 s/p SJM 2411-36C dual chamber AICD (serial Number  [id number])- followed by Dr. Caryl Comes   Arthritis    a. L knee.   Asthma    Basal cell carcinoma 08/29/2008   Vertex scalp. Keratotic pattern, ulcerated.   Basal cell carcinoma 01/15/2018   Left distal medial thigh near knee. Fibroepithelioma of pinkus type   Basal cell carcinoma 01/15/2018   Right distal lat. nose ant. inferior edge. Nodular pattern.   Cardiac arrest ([place])    a. 03/30/2014 VF arrest in setting of NICM >> CPR/defib in community >> s/p dual chamber AICD   Coronary artery disease, non-occlusive    a. cath 03/2014 at [facility]: no sig CAD (OM1 30%, CFX 30%), EF 35%   Dysplastic nevus 01/31/2020   L mid back paraspinal - severe, excision 03/21/2020   HLD (hyperlipidemia)    HTN (hypertension)    Kidney stones    Melanoma ([place])    Melanoma resected from scalp   NICM (nonischemic cardiomyopathy) ([place])    a. 03/2014 Echo:  Inferolateral and lateral HK, EF 99991111, grade 1 diastolic dysfunction, mild MR, mild LAE, mild RAE, trivial TR,  no effusion; 09/2018 Echo: EF 35-40%, impaired relaxation, glob HK, sev apical HK. Nl RV fxn. RVSP 40mHg. Mildly dil LA.   Paroxysmal atrial fibrillation [place] 03/28/2015   Retinal artery occlusion     Past Surgical History:  Procedure Laterality Date   adenomatous polyps     CARDIAC CATHETERIZATION  03/30/2014   CARDIOVERSION  03/30/2014   COLONOSCOPY WITH PROPOFOL N/A 03/24/2017   Procedure: COLONOSCOPY WITH PROPOFOL;  Surgeon: SLollie Sails MD;  [facility];  Service: Endoscopy;  Laterality: N/A;   COLONOSCOPY WITH PROPOFOL N/A 01/07/2019   Procedure: COLONOSCOPY WITH PROPOFOL;  Surgeon: Toledo, TBenay Pike MD;  Location: [facility] ENDOSCOPY;  Service: Gastroenterology;  Laterality: N/A;   CYSTOSCOPY W/ LITHOLAPAXY / EHL     IMPLANTABLE CARDIOVERTER DEFIBRILLATOR IMPLANT N/A 03/31/2014   Procedure: IMPLANTABLE CARDIOVERTER DEFIBRILLATOR IMPLANT;  Surgeon: GEvans Lance MD;  [facility];  Service: Cardiovascular;  Laterality: N/A;    JOINT REPLACEMENT     TONSILLECTOMY     "as a kid"   TOTAL KNEE ARTHROPLASTY Left ~ 2010   TOTAL KNEE ARTHROPLASTY WITH REVISION COMPONENTS Left ~ 2011   VARICOSE VEIN SURGERY Left ~ 2014    Current Outpatient Medications  Medication Sig Dispense Refill   apixaban (ELIQUIS) 5 MG TABS tablet Take 1 tablet (5 mg total) by mouth 2 (two) times daily. 180 tablet 3   atorvastatin (LIPITOR) 10 MG tablet Take 1 tablet (10 mg total) by mouth daily. 90 tablet 3   azelastine (ASTELIN) 0.1 % nasal spray Place 1 spray into both nostrils 2 (two) times daily. Use in each nostril as directed     carvedilol (COREG) 25 MG tablet Take 25 mg by mouth 2 (two) times daily.     cetirizine (ZYRTEC) 10 MG tablet Take 1 tablet (10 mg) by mouth once daily     fluticasone-salmeterol (WIXELA INHUB) 250-50 MCG/ACT AEPB Inhale 1 puff into the lungs in the morning and at bedtime.     furosemide (LASIX) 40 MG tablet TAKE 1 TABLET BY MOUTH TWICE A DAY 2 DAYS A WEEK AND 1 TABLET ONCE A DAY ALL OTHER DAYS. 108 tablet 3   gabapentin (NEURONTIN) 100 MG capsule Take 100 mg by mouth 3 (three) times daily.     levothyroxine (SYNTHROID) 100 MCG tablet Take 1 tablet (100 mcg total) by mouth daily before breakfast. 90 tablet 1   losartan (COZAAR) 25 MG tablet TAKE 1 TABLET (25 MG TOTAL) BY MOUTH DAILY. 90 tablet 2   sotalol (BETAPACE) 80 MG tablet TAKE 1 TABLET BY MOUTH TWICE A DAY 180 tablet 3   spironolactone (ALDACTONE) 25 MG tablet Take 1 tablet (25 mg) by mouth once daily 90 tablet 1   No current facility-administered medications for this visit.   Facility-Administered Medications Ordered in Other Visits  Medication Dose Route Frequency Provider Last Rate Last Admin   technetium tetrofosmin (TC-MYOVIEW) injection 2123XX123millicurie  2123XX123millicurie Intravenous Once PRN RFay Records MD        Allergies  Allergen Reactions   EDelene Loll[Sacubitril-Valsartan]     hallucinations    Review of Systems negative except from  HPI and PMH  Physical Exam  BP 100/64 (BP Location: Left Arm, Patient Position: Sitting, Cuff Size: Large)   Pulse 63   Ht 5' 6"$  (1.676 m)   Wt 220 lb 4 oz (99.9 kg)   SpO2 98%   BMI 35.55 kg/m  Well developed and well nourished in no acute distress  HENT normal Neck supple with JVP-flat Clear Device pocket well healed; without hematoma or erythema.  There is no tethering  Regular rate and rhythm, no murmur Abd-soft with active BS No Clubbing cyanosis L>R 2+ edema Skin-warm and dry A & Oriented  Grossly normal sensory and motor function  ECG atrial pacing 63 Levels 20/09/44  Device function is normal. 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  Blood pressure is little bit higher.  Will continue his carvedilol 3.25, losartan 25 and the Aldactone 25.\  Interval atrial flutter, not significant burden.  Continue the sotalol 20 mg twice daily  Will recheck his creatinine clearance, last time it had gone up to 1.4 from 1.0-1.2 range  No clinical bleeding, however, we will recheck the hemoglobin it  also decreased at the last visit.   He is volume overloaded.  We discussed the trade off of urinating with heart failure manifested by dyspnea and peripheral edema, potential risks of edema if he were to develop cellulitis with risk of infection.  He is inclined to try to be a little bit  more aggressive with his diuretics.  I recommendation will be informed by his renal function

## 2022-05-07 NOTE — Patient Instructions (Signed)
Medication Instructions:  - Your physician recommends that you continue on your current medications as directed. Please refer to the Current Medication list given to you today.  *If you need a refill on your cardiac medications before your next appointment, please call your pharmacy*   Lab Work: - Your physician recommends that you have lab work today:  CMET/ CBC/ [place] Entrance at [facility] 1st desk on the right to check in (REGISTRATION)  Lab hours: Monday- Friday (7:30 am- 5:30 pm)   If you have labs (blood work) drawn today and your tests are completely normal, you will receive your results only by: MyChart Message (if you have MyChart) OR A paper copy in the mail If you have any lab test that is abnormal or we need to change your treatment, we will call you to review the results.   Testing/Procedures: - none ordered   Follow-Up: At [facility], you and your health needs are our priority.  As part of our continuing mission to provide you with exceptional heart care, we have created designated Provider Care Teams.  These Care Teams include your primary Cardiologist (physician) and Advanced Practice Providers (APPs -  Physician Assistants and Nurse Practitioners) who all work together to provide you with the care you need, when you need it.  We recommend signing up for the patient portal called "MyChart".  Sign up information is provided on this After Visit Summary.  MyChart is used to connect with patients for Virtual Visits (Telemedicine).  Patients are able to view lab/test results, encounter notes, upcoming appointments, etc.  Non-urgent messages can be sent to your provider as well.   To learn more about what you can do with MyChart, go to [email or web address].    Your next appointment:   6 month(s)  Provider:   Virl Axe, MD    Other Instructions N/a

## 2022-05-10 ENCOUNTER — Other Ambulatory Visit: Payer: Self-pay | Admitting: Internal Medicine

## 2022-05-15 ENCOUNTER — Telehealth: Payer: Self-pay | Admitting: Internal Medicine

## 2022-05-15 NOTE — Telephone Encounter (Signed)
Pt's daughter is calling requesting update on labs. Please advise.

## 2022-05-15 NOTE — Telephone Encounter (Signed)
Spoke with patient's daughter and explained to her that Dr. Caryl Comes has not yet had a chance to review the labs but preliminarily they look unchanged from the last lab draw. Tienken stated that she would inform her father.

## 2022-05-16 ENCOUNTER — Other Ambulatory Visit: Payer: Self-pay

## 2022-05-16 MED ORDER — FLUTICASONE PROPIONATE 50 MCG/ACT NA SUSP: 1.0000 | Freq: Every day | NASAL | 3 refills | Status: DC

## 2022-05-20 ENCOUNTER — Other Ambulatory Visit: Payer: Self-pay | Admitting: Internal Medicine

## 2022-05-20 ENCOUNTER — Encounter: Payer: Self-pay | Admitting: Internal Medicine

## 2022-05-22 NOTE — Telephone Encounter (Signed)
Pt daughter called back again for an update on lab results

## 2022-05-23 ENCOUNTER — Encounter: Payer: Self-pay | Admitting: Internal Medicine

## 2022-06-06 ENCOUNTER — Other Ambulatory Visit: Payer: Self-pay | Admitting: Internal Medicine

## 2022-06-06 DIAGNOSIS — J449 Chronic obstructive pulmonary disease, unspecified: Secondary | ICD-10-CM

## 2022-06-13 NOTE — Progress Notes (Signed)
Remote ICD transmission.   

## 2022-06-24 ENCOUNTER — Encounter: Payer: Self-pay | Admitting: Internal Medicine

## 2022-07-04 ENCOUNTER — Telehealth: Payer: Self-pay | Admitting: Internal Medicine

## 2022-07-04 MED ORDER — SOTALOL HCL 80 MG PO TABS: 80.0000 mg | ORAL_TABLET | Freq: Two times a day (BID) | ORAL | 3 refills | Status: DC

## 2022-07-04 NOTE — Telephone Encounter (Signed)
*  STAT* If patient is at the pharmacy, call can be transferred to refill team.   1. Which medications need to be refilled? (please list name of each medication and dose if known)  sotalol (BETAPACE) 80 MG tablet   TAKE 1 TABLET BY MOUTH TWICE A DAY  2. Which pharmacy/location (including street and city if local pharmacy) is medication to be sent to?[id number] [place], [place] [phone]   3. Do they need a 30 day or 90 day supply? 90 Day Supply

## 2022-07-04 NOTE — Telephone Encounter (Signed)
Pt's medication was sent to pt's pharmacy as requested. Confirmation received.  °

## 2022-07-05 ENCOUNTER — Other Ambulatory Visit: Payer: Medicare Other

## 2022-07-05 ENCOUNTER — Other Ambulatory Visit: Payer: Self-pay | Admitting: Internal Medicine

## 2022-07-05 DIAGNOSIS — Z13220 Encounter for screening for lipoid disorders: Secondary | ICD-10-CM

## 2022-07-05 DIAGNOSIS — I10 Essential (primary) hypertension: Secondary | ICD-10-CM

## 2022-07-05 DIAGNOSIS — Z131 Encounter for screening for diabetes mellitus: Secondary | ICD-10-CM

## 2022-07-06 LAB — MAGNESIUM: Magnesium: 2.1 mg/dL (ref 1.6–2.3)

## 2022-07-08 ENCOUNTER — Ambulatory Visit: Payer: Medicare Other | Admitting: Internal Medicine

## 2022-07-10 ENCOUNTER — Encounter: Payer: Self-pay | Admitting: Internal Medicine

## 2022-07-10 ENCOUNTER — Ambulatory Visit (INDEPENDENT_AMBULATORY_CARE_PROVIDER_SITE_OTHER): Payer: Medicare Other | Admitting: Internal Medicine

## 2022-07-10 VITALS — BP 110/70 | HR 65 | Temp 95.9°F | Ht 66.0 in | Wt 216.6 lb

## 2022-07-10 DIAGNOSIS — E039 Hypothyroidism, unspecified: Secondary | ICD-10-CM

## 2022-07-10 DIAGNOSIS — J449 Chronic obstructive pulmonary disease, unspecified: Secondary | ICD-10-CM

## 2022-07-10 DIAGNOSIS — E782 Mixed hyperlipidemia: Secondary | ICD-10-CM

## 2022-07-10 DIAGNOSIS — J301 Allergic rhinitis due to pollen: Secondary | ICD-10-CM | POA: Diagnosis not present

## 2022-07-10 DIAGNOSIS — I10 Essential (primary) hypertension: Secondary | ICD-10-CM

## 2022-07-10 DIAGNOSIS — J42 Unspecified chronic bronchitis: Secondary | ICD-10-CM | POA: Diagnosis not present

## 2022-07-10 MED ORDER — CETIRIZINE HCL 10 MG PO TABS: 10.0000 mg | ORAL_TABLET | Freq: Every day | ORAL | 2 refills | Status: DC

## 2022-07-10 MED ORDER — FLUTICASONE-SALMETEROL 250-50 MCG/ACT IN AEPB: INHALATION_SPRAY | RESPIRATORY_TRACT | 5 refills | Status: DC

## 2022-07-10 MED ORDER — AZELASTINE HCL 137 MCG/SPRAY NA SOLN: NASAL | 2 refills | Status: DC

## 2022-07-10 NOTE — Progress Notes (Signed)
Established Patient Office Visit  Subjective:  Patient ID: Ethan Kirtz., male    DOB: 11/27/1945  Age: 77 y.o. MRN: [id number]  Chief Complaint  Patient presents with   Follow-up    3 month follow up    No new complaints, here for lab review and medication refills. Stable dyspnea and denies increased leg swelling. Denies chest pain or palpitations. Labs reviewed and notable for Mg within the target range.    No other concerns at this time.   Past Medical History:  Diagnosis Date   Actinic keratosis    AICD (automatic cardioverter/defibrillator) present 03/31/2014   a. 03/2014 Ira Busbin/p SJM 2411-36C dual chamber AICD (serial Number [id number])- followed by Dr. Graciela Husbands   Arthritis    a. L knee.   Asthma    Basal cell carcinoma 08/29/2008   Vertex scalp. Keratotic pattern, ulcerated.   Basal cell carcinoma 01/15/2018   Left distal medial thigh near knee. Fibroepithelioma of pinkus type   Basal cell carcinoma 01/15/2018   Right distal lat. nose ant. inferior edge. Nodular pattern.   Cardiac arrest    a. 03/30/2014 VF arrest in setting of NICM >> CPR/defib in community >> Hudson Lehmkuhl/p dual chamber AICD   Coronary artery disease, non-occlusive    a. cath 03/2014 at [facility]: no sig CAD (OM1 30%, CFX 30%), EF 35%   Dysplastic nevus 01/31/2020   L mid back paraspinal - severe, excision 03/21/2020   HLD (hyperlipidemia)    HTN (hypertension)    Kidney stones    Melanoma    Melanoma resected from scalp   NICM (nonischemic cardiomyopathy)    a. 03/2014 Echo:  Inferolateral and lateral HK, EF 50-55%, grade 1 diastolic dysfunction, mild MR, mild LAE, mild RAE, trivial TR, no effusion; 09/2018 Echo: EF 35-40%, impaired relaxation, glob HK, sev apical HK. Nl RV fxn. RVSP . Mildly dil LA.   Paroxysmal atrial fibrillation 03/28/2015   Retinal artery occlusion     Past Surgical History:  Procedure Laterality Date   adenomatous polyps     CARDIAC CATHETERIZATION  03/30/2014   CARDIOVERSION   03/30/2014   COLONOSCOPY WITH PROPOFOL N/A 03/24/2017   Procedure: COLONOSCOPY WITH PROPOFOL;  Surgeon: Christena Deem, MD;  Location: [facility] ENDOSCOPY;  Service: Endoscopy;  Laterality: N/A;   COLONOSCOPY WITH PROPOFOL N/A 01/07/2019   Procedure: COLONOSCOPY WITH PROPOFOL;  Surgeon: Toledo, Boykin Nearing, MD;  Location: [facility] ENDOSCOPY;  Service: Gastroenterology;  Laterality: N/A;   CYSTOSCOPY W/ LITHOLAPAXY / EHL     IMPLANTABLE CARDIOVERTER DEFIBRILLATOR IMPLANT N/A 03/31/2014   Procedure: IMPLANTABLE CARDIOVERTER DEFIBRILLATOR IMPLANT;  Surgeon: Marinus Maw, MD;  Location: [facility] CATH LAB;  Service: Cardiovascular;  Laterality: N/A;   JOINT REPLACEMENT     TONSILLECTOMY     "as a kid"   TOTAL KNEE ARTHROPLASTY Left ~ 2010   TOTAL KNEE ARTHROPLASTY WITH REVISION COMPONENTS Left ~ 2011   VARICOSE VEIN SURGERY Left ~ 2014    Social History   Socioeconomic History   Marital status: Divorced    Spouse name: Not on file   Number of children: Not on file   Years of education: Not on file   Highest education level: Not on file  Occupational History   Occupation: retired    Comment: Chartered certified accountant at [employer]  Tobacco Use   Smoking status: Never   Smokeless tobacco: Never  Vaping Use   Vaping Use: Never used  Substance and Sexual Activity   Alcohol use: No  Drug use: No   Sexual activity: Not Currently  Other Topics Concern   Not on file  Social History Narrative   Not on file   Social Determinants of Health   Financial Resource Strain: Not on file  Food Insecurity: Not on file  Transportation Needs: Not on file  Physical Activity: Not on file  Stress: Not on file  Social Connections: Not on file  Intimate Partner Violence: Not on file    Family History  Problem Relation Age of Onset   Esophageal cancer Mother        died at 11   Heart attack Father        died at 52   Hypertension Brother    Colon polyps Brother     Allergies  Allergen Reactions   Entresto  [Sacubitril-Valsartan]     hallucinations    Review of Systems  All other systems reviewed and are negative.      Objective:   BP 110/70   Pulse 65   Temp (!) 95.9 F (35.5 C) (Tympanic)   Ht 5\' 6"  (1.676 m)   Wt 216 lb 9.6 oz (98.2 kg)   SpO2 97%   BMI 34.96 kg/m   Vitals:   07/10/22 0838  BP: 110/70  Pulse: 65  Temp: (!) 95.9 F (35.5 C)  Height: 5\' 6"  (1.676 m)  Weight: 216 lb 9.6 oz (98.2 kg)  SpO2: 97%  TempSrc: Tympanic  BMI (Calculated): 34.98    Physical Exam Vitals reviewed.  Constitutional:      General: He is not in acute distress.    Appearance: Normal appearance. He is obese.  HENT:     Head: Normocephalic.     Left Ear: There is no impacted cerumen.     Nose: Nose normal.     Mouth/Throat:     Mouth: Mucous membranes are moist.     Pharynx: No posterior oropharyngeal erythema.  Eyes:     Extraocular Movements: Extraocular movements intact.     Pupils: Pupils are equal, round, and reactive to light.  Cardiovascular:     Rate and Rhythm: Normal rate and regular rhythm.     Chest Wall: PMI is not displaced.     Pulses: Normal pulses.     Heart sounds: Normal heart sounds. No murmur heard. Pulmonary:     Effort: Pulmonary effort is normal.     Breath sounds: Normal air entry. No rhonchi or rales.  Abdominal:     General: Abdomen is flat. Bowel sounds are normal. There is no distension.     Palpations: Abdomen is soft. There is no hepatomegaly, splenomegaly or mass.     Tenderness: There is no abdominal tenderness.  Musculoskeletal:        General: Normal range of motion.     Cervical back: Normal range of motion and neck supple.     Right lower leg: No edema.     Left lower leg: No edema.  Skin:    General: Skin is warm and dry.  Neurological:     General: No focal deficit present.     Mental Status: He is alert and oriented to person, place, and time.     Cranial Nerves: No cranial nerve deficit.     Motor: No weakness.   Psychiatric:        Mood and Affect: Mood normal.        Behavior: Behavior normal.      No results found for any visits  on 07/10/22.      Assessment & Plan:   Problem List Items Addressed This Visit   None   No follow-ups on file.   Total time spent: 20 minutes  Luna Fuse, MD  07/10/2022

## 2022-07-11 ENCOUNTER — Encounter: Payer: Self-pay | Admitting: Internal Medicine

## 2022-07-28 ENCOUNTER — Encounter: Payer: Self-pay | Admitting: Internal Medicine

## 2022-07-29 ENCOUNTER — Ambulatory Visit (INDEPENDENT_AMBULATORY_CARE_PROVIDER_SITE_OTHER): Payer: Medicare Other

## 2022-07-29 DIAGNOSIS — I495 Sick sinus syndrome: Secondary | ICD-10-CM | POA: Diagnosis not present

## 2022-07-29 LAB — CUP PACEART REMOTE DEVICE CHECK
Battery Remaining Longevity: 8 mo
Battery Remaining Percentage: 9 %
Battery Voltage: 2.66 V
Brady Statistic AP VP Percent: 1 %
Brady Statistic AP VS Percent: 99 %
Brady Statistic AS VP Percent: 1 %
Brady Statistic AS VS Percent: 1 %
Brady Statistic RA Percent Paced: 99 %
Brady Statistic RV Percent Paced: 1 %
Date Time Interrogation Session: 20240513020014
HighPow Impedance: 63 Ohm
HighPow Impedance: 63 Ohm
Implantable Lead Connection Status: 753985
Implantable Lead Connection Status: 753985
Implantable Lead Implant Date: 20160114
Implantable Lead Implant Date: 20160114
Implantable Lead Location: 753859
Implantable Lead Location: 753860
Implantable Lead Model: 7122
Implantable Pulse Generator Implant Date: 20160114
Lead Channel Impedance Value: 350 Ohm
Lead Channel Impedance Value: 350 Ohm
Lead Channel Pacing Threshold Amplitude: 0.625 V
Lead Channel Pacing Threshold Amplitude: 1.125 V
Lead Channel Pacing Threshold Pulse Width: 0.5 ms
Lead Channel Pacing Threshold Pulse Width: 0.6 ms
Lead Channel Sensing Intrinsic Amplitude: 1.3 mV
Lead Channel Sensing Intrinsic Amplitude: 9.5 mV
Lead Channel Setting Pacing Amplitude: 1.375
Lead Channel Setting Pacing Amplitude: 1.625
Lead Channel Setting Pacing Pulse Width: 0.6 ms
Lead Channel Setting Sensing Sensitivity: 0.5 mV
Pulse Gen Serial Number: 7241695

## 2022-08-26 NOTE — Progress Notes (Signed)
Remote ICD transmission.   

## 2022-09-05 ENCOUNTER — Other Ambulatory Visit: Payer: Self-pay | Admitting: Internal Medicine

## 2022-09-05 DIAGNOSIS — J301 Allergic rhinitis due to pollen: Secondary | ICD-10-CM

## 2022-09-29 ENCOUNTER — Other Ambulatory Visit: Payer: Self-pay | Admitting: Internal Medicine

## 2022-10-02 ENCOUNTER — Other Ambulatory Visit: Payer: Self-pay | Admitting: Internal Medicine

## 2022-10-02 ENCOUNTER — Encounter: Payer: Self-pay | Admitting: Internal Medicine

## 2022-10-02 MED ORDER — ATORVASTATIN CALCIUM 10 MG PO TABS
10.0000 mg | ORAL_TABLET | Freq: Every day | ORAL | 3 refills | Status: DC
Start: 2022-10-02 — End: 2023-08-18

## 2022-10-02 NOTE — Telephone Encounter (Signed)
Patient's daughter called in stating patient only has a few atorvastatin left. Please send in.

## 2022-10-04 ENCOUNTER — Other Ambulatory Visit: Payer: Self-pay

## 2022-10-04 ENCOUNTER — Encounter: Payer: Self-pay | Admitting: Internal Medicine

## 2022-10-04 MED ORDER — APIXABAN 5 MG PO TABS: 5.0000 mg | ORAL_TABLET | Freq: Two times a day (BID) | ORAL | 3 refills | Status: DC

## 2022-10-04 NOTE — Telephone Encounter (Signed)
Can you let me know if its okay to print a prescription for patient

## 2022-10-09 ENCOUNTER — Encounter: Payer: Self-pay | Admitting: Internal Medicine

## 2022-10-09 ENCOUNTER — Ambulatory Visit (INDEPENDENT_AMBULATORY_CARE_PROVIDER_SITE_OTHER): Payer: Medicare Other | Admitting: Internal Medicine

## 2022-10-09 ENCOUNTER — Other Ambulatory Visit: Payer: Medicare Other

## 2022-10-09 VITALS — BP 128/64 | HR 59 | Ht 66.0 in | Wt 211.0 lb

## 2022-10-09 DIAGNOSIS — E039 Hypothyroidism, unspecified: Secondary | ICD-10-CM | POA: Insufficient documentation

## 2022-10-09 DIAGNOSIS — I10 Essential (primary) hypertension: Secondary | ICD-10-CM

## 2022-10-09 DIAGNOSIS — J42 Unspecified chronic bronchitis: Secondary | ICD-10-CM | POA: Diagnosis not present

## 2022-10-09 DIAGNOSIS — Z131 Encounter for screening for diabetes mellitus: Secondary | ICD-10-CM | POA: Diagnosis not present

## 2022-10-09 DIAGNOSIS — Z13220 Encounter for screening for lipoid disorders: Secondary | ICD-10-CM | POA: Diagnosis not present

## 2022-10-09 DIAGNOSIS — Z792 Long term (current) use of antibiotics: Secondary | ICD-10-CM

## 2022-10-09 DIAGNOSIS — E782 Mixed hyperlipidemia: Secondary | ICD-10-CM

## 2022-10-09 LAB — CBC WITH DIFF/PLATELET
Basophils Absolute: 0 10*3/uL (ref 0.0–0.2)
Basos: 1 %
EOS (ABSOLUTE): 0.2 10*3/uL (ref 0.0–0.4)
Eos: 3 %
Hematocrit: 37.7 % (ref 37.5–51.0)
Hemoglobin: 12.6 g/dL — ABNORMAL LOW (ref 13.0–17.7)
Immature Grans (Abs): 0 10*3/uL (ref 0.0–0.1)
Immature Granulocytes: 0 %
Lymphocytes Absolute: 1.1 10*3/uL (ref 0.7–3.1)
Lymphs: 16 %
MCH: 31.7 pg (ref 26.6–33.0)
MCHC: 33.4 g/dL (ref 31.5–35.7)
MCV: 95 fL (ref 79–97)
Monocytes Absolute: 0.7 10*3/uL (ref 0.1–0.9)
Monocytes: 10 %
Neutrophils Absolute: 4.9 10*3/uL (ref 1.4–7.0)
Neutrophils: 70 %
Platelets: 220 10*3/uL (ref 150–450)
RBC: 3.97 x10E6/uL — ABNORMAL LOW (ref 4.14–5.80)
RDW: 11.8 % (ref 11.6–15.4)
WBC: 7 10*3/uL (ref 3.4–10.8)

## 2022-10-09 MED ORDER — AMOXICILLIN 500 MG PO CAPS
2000.0000 mg | ORAL_CAPSULE | Freq: Once | ORAL | 0 refills | Status: AC
Start: 2022-10-09 — End: 2022-10-09

## 2022-10-09 NOTE — Progress Notes (Signed)
Established Patient Office Visit  Subjective:  Patient ID: Ethan Fellers., male    DOB: Feb 27, 1946  Age: 77 y.o. MRN: [id number]  Chief Complaint  Patient presents with   Follow-up    3 month follow up    No new complaints, here for lab review and medication refills. Memory is deteriorating. SOB has improved with use of MDI.     No other concerns at this time.   Past Medical History:  Diagnosis Date   Actinic keratosis    AICD (automatic cardioverter/defibrillator) present 03/31/2014   a. 03/2014 Charnita Trudel/p SJM 2411-36C dual chamber AICD (serial Number [id number])- followed by Dr. Graciela Husbands   Arthritis    a. L knee.   Asthma    Basal cell carcinoma 08/29/2008   Vertex scalp. Keratotic pattern, ulcerated.   Basal cell carcinoma 01/15/2018   Left distal medial thigh near knee. Fibroepithelioma of pinkus type   Basal cell carcinoma 01/15/2018   Right distal lat. nose ant. inferior edge. Nodular pattern.   Cardiac arrest (HCC)    a. 03/30/2014 VF arrest in setting of NICM >> CPR/defib in community >> Jenisa Monty/p dual chamber AICD   Coronary artery disease, non-occlusive    a. cath 03/2014 at [facility]: no sig CAD (OM1 30%, CFX 30%), EF 35%   Dysplastic nevus 01/31/2020   L mid back paraspinal - severe, excision 03/21/2020   HLD (hyperlipidemia)    HTN (hypertension)    Kidney stones    Melanoma (HCC)    Melanoma resected from scalp   NICM (nonischemic cardiomyopathy) (HCC)    a. 03/2014 Echo:  Inferolateral and lateral HK, EF 50-55%, grade 1 diastolic dysfunction, mild MR, mild LAE, mild RAE, trivial TR, no effusion; 09/2018 Echo: EF 35-40%, impaired relaxation, glob HK, sev apical HK. Nl RV fxn. RVSP . Mildly dil LA.   Paroxysmal atrial fibrillation (HCC) 03/28/2015   Retinal artery occlusion     Past Surgical History:  Procedure Laterality Date   adenomatous polyps     CARDIAC CATHETERIZATION  03/30/2014   CARDIOVERSION  03/30/2014   COLONOSCOPY WITH PROPOFOL N/A 03/24/2017   Procedure:  COLONOSCOPY WITH PROPOFOL;  Surgeon: Christena Deem, MD;  Location: [facility] ENDOSCOPY;  Service: Endoscopy;  Laterality: N/A;   COLONOSCOPY WITH PROPOFOL N/A 01/07/2019   Procedure: COLONOSCOPY WITH PROPOFOL;  Surgeon: Toledo, Boykin Nearing, MD;  Location: [facility] ENDOSCOPY;  Service: Gastroenterology;  Laterality: N/A;   CYSTOSCOPY W/ LITHOLAPAXY / EHL     IMPLANTABLE CARDIOVERTER DEFIBRILLATOR IMPLANT N/A 03/31/2014   Procedure: IMPLANTABLE CARDIOVERTER DEFIBRILLATOR IMPLANT;  Surgeon: Marinus Maw, MD;  Location: [facility] CATH LAB;  Service: Cardiovascular;  Laterality: N/A;   JOINT REPLACEMENT     TONSILLECTOMY     "as a kid"   TOTAL KNEE ARTHROPLASTY Left ~ 2010   TOTAL KNEE ARTHROPLASTY WITH REVISION COMPONENTS Left ~ 2011   VARICOSE VEIN SURGERY Left ~ 2014    Social History   Socioeconomic History   Marital status: Divorced    Spouse name: Not on file   Number of children: Not on file   Years of education: Not on file   Highest education level: Not on file  Occupational History   Occupation: retired    Comment: Chartered certified accountant at [employer]  Tobacco Use   Smoking status: Never   Smokeless tobacco: Never  Vaping Use   Vaping status: Never Used  Substance and Sexual Activity   Alcohol use: No   Drug use: No   Sexual activity:  Not Currently  Other Topics Concern   Not on file  Social History Narrative   Not on file   Social Determinants of Health   Financial Resource Strain: Not on file  Food Insecurity: Not on file  Transportation Needs: Not on file  Physical Activity: Not on file  Stress: Not on file  Social Connections: Not on file  Intimate Partner Violence: Not on file    Family History  Problem Relation Age of Onset   Esophageal cancer Mother        died at 60   Heart attack Father        died at 41   Hypertension Brother    Colon polyps Brother     Allergies  Allergen Reactions   Entresto [Sacubitril-Valsartan]     hallucinations    Review of  Systems  Constitutional:  Positive for weight loss (5 lbs).  HENT: Negative.    Eyes: Negative.   Respiratory: Negative.  Negative for shortness of breath.   Cardiovascular: Negative.  Negative for chest pain, palpitations and orthopnea.  Gastrointestinal: Negative.   Genitourinary: Negative.   Skin: Negative.   Neurological: Negative.   Endo/Heme/Allergies: Negative.        Objective:   BP 128/64   Pulse (!) 59   Ht 5\' 6"  (1.676 m)   Wt 211 lb (95.7 kg)   SpO2 95%   BMI 34.06 kg/m   Vitals:   10/09/22 0820  BP: 128/64  Pulse: (!) 59  Height: 5\' 6"  (1.676 m)  Weight: 211 lb (95.7 kg)  SpO2: 95%  BMI (Calculated): 34.07    Physical Exam Vitals reviewed.  Constitutional:      General: He is not in acute distress.    Appearance: Normal appearance. He is obese.  HENT:     Head: Normocephalic.     Left Ear: There is no impacted cerumen.     Nose: Nose normal.     Mouth/Throat:     Mouth: Mucous membranes are moist.     Pharynx: No posterior oropharyngeal erythema.  Eyes:     Extraocular Movements: Extraocular movements intact.     Pupils: Pupils are equal, round, and reactive to light.  Cardiovascular:     Rate and Rhythm: Normal rate and regular rhythm.     Chest Wall: PMI is not displaced.     Pulses: Normal pulses.     Heart sounds: Normal heart sounds. No murmur heard. Pulmonary:     Effort: Pulmonary effort is normal.     Breath sounds: Normal air entry. No rhonchi or rales.  Chest:     Comments: Left ICD pocket not erythematous or fluctuant Abdominal:     General: Abdomen is flat. Bowel sounds are normal. There is no distension.     Palpations: Abdomen is soft. There is no hepatomegaly, splenomegaly or mass.     Tenderness: There is no abdominal tenderness.  Musculoskeletal:        General: Normal range of motion.     Cervical back: Normal range of motion and neck supple.     Right lower leg: No edema.     Left lower leg: No edema.  Skin:     General: Skin is warm and dry.  Neurological:     General: No focal deficit present.     Mental Status: He is alert and oriented to person, place, and time.     Cranial Nerves: No cranial nerve deficit.     Motor:  No weakness.  Psychiatric:        Mood and Affect: Mood normal.        Behavior: Behavior normal.      No results found for any visits on 10/09/22.      Assessment & Plan:   Problem List Items Addressed This Visit       Cardiovascular and Mediastinum   HTN (hypertension) - Primary     Endocrine   Acquired hypothyroidism     Other   Mixed hyperlipidemia   Other Visit Diagnoses     Need for antibiotic prophylaxis for dental procedure       Screening for hyperlipidemia       Screening for diabetes mellitus       Chronic bronchitis, unspecified chronic bronchitis type (HCC)           Return in about 3 months (around 01/09/2023) for awv with labs prior.   Total time spent: 20 minutes  Luna Fuse, MD  10/09/2022   This document may have been prepared by [facility] Voice Recognition software and as such may include unintentional dictation errors.

## 2022-10-10 LAB — COMPREHENSIVE METABOLIC PANEL
ALT: 22 IU/L (ref 0–44)
Albumin: 4.2 g/dL (ref 3.8–4.8)
Alkaline Phosphatase: 77 IU/L (ref 44–121)
BUN/Creatinine Ratio: 19 (ref 10–24)
BUN: 23 mg/dL (ref 8–27)
Bilirubin Total: 2.7 mg/dL — ABNORMAL HIGH (ref 0.0–1.2)
CO2: 21 mmol/L (ref 20–29)
Calcium: 9.5 mg/dL (ref 8.6–10.2)
Chloride: 106 mmol/L (ref 96–106)
Creatinine, Ser: 1.2 mg/dL (ref 0.76–1.27)
Globulin, Total: 1.8 g/dL (ref 1.5–4.5)
Glucose: 101 mg/dL — ABNORMAL HIGH (ref 70–99)
Potassium: 4.9 mmol/L (ref 3.5–5.2)
Sodium: 141 mmol/L (ref 134–144)
Total Protein: 6 g/dL (ref 6.0–8.5)
eGFR: 62 mL/min/{1.73_m2} (ref >59)

## 2022-10-10 LAB — LIPID PANEL
Chol/HDL Ratio: 2.7 ratio (ref 0.0–5.0)
Cholesterol, Total: 103 mg/dL (ref 100–199)
HDL: 38 mg/dL — ABNORMAL LOW (ref >39)
LDL Chol Calc (NIH): 47 mg/dL (ref 0–99)
Triglycerides: 93 mg/dL (ref 0–149)

## 2022-10-10 LAB — HEMOGLOBIN A1C: Est. average glucose Bld gHb Est-mCnc: 120 mg/dL

## 2022-10-10 LAB — CK: Total CK: 51 U/L (ref 41–331)

## 2022-10-10 LAB — TSH: TSH: 0.059 u[IU]/mL — ABNORMAL LOW (ref 0.450–4.500)

## 2022-10-11 ENCOUNTER — Other Ambulatory Visit: Payer: Self-pay | Admitting: Internal Medicine

## 2022-10-11 DIAGNOSIS — E039 Hypothyroidism, unspecified: Secondary | ICD-10-CM

## 2022-10-11 MED ORDER — LEVOTHYROXINE SODIUM 75 MCG PO TABS
75.0000 ug | ORAL_TABLET | Freq: Every day | ORAL | 0 refills | Status: DC
Start: 2022-10-11 — End: 2023-01-03

## 2022-10-11 NOTE — Progress Notes (Signed)
Daughter informed via portal

## 2022-10-19 ENCOUNTER — Other Ambulatory Visit: Payer: Self-pay | Admitting: Internal Medicine

## 2022-10-20 DIAGNOSIS — S5001XA Contusion of right elbow, initial encounter: Secondary | ICD-10-CM | POA: Diagnosis not present

## 2022-10-20 DIAGNOSIS — S50812A Abrasion of left forearm, initial encounter: Secondary | ICD-10-CM | POA: Diagnosis not present

## 2022-10-20 DIAGNOSIS — S5002XA Contusion of left elbow, initial encounter: Secondary | ICD-10-CM | POA: Diagnosis not present

## 2022-10-20 DIAGNOSIS — S50811A Abrasion of right forearm, initial encounter: Secondary | ICD-10-CM | POA: Diagnosis not present

## 2022-10-28 ENCOUNTER — Ambulatory Visit (INDEPENDENT_AMBULATORY_CARE_PROVIDER_SITE_OTHER): Payer: Medicare Other

## 2022-10-28 DIAGNOSIS — I495 Sick sinus syndrome: Secondary | ICD-10-CM | POA: Diagnosis not present

## 2022-11-04 ENCOUNTER — Encounter: Payer: Self-pay | Admitting: Internal Medicine

## 2022-11-04 NOTE — Progress Notes (Unsigned)
Patient Care Team: Sherron Monday, MD as PCP - General (Internal Medicine) Duke Salvia, MD as PCP - Cardiology (Cardiology)   HPI  Ethan Vega. is a 77 y.o. 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    On sotalol.   Atrial fibrillation-persistent, anticoagulation w Apixoban-no bleeding.  Eval for Cardiac Arrest Flecainide challenge was negative ECG  Low Volts R-V1 with RAD with mild RA enlargement SAECG not done as he was in AFib RVR and rate related RBBB Abnormal thallium scan suggesting sarcoid   The patient denies chest pain, nocturnal dyspnea, orthopnea.  There have been no palpitations, lightheadedness or syncope.  Complains of edema and shortness of breath .    Struggling with paying for his medicines  discussed [facility] and [facility]      TERF  Stroke-2, age 56, HTN-1 DM -1 for CHADSVAS score >= 6                Date Cr K Hgb  Mg  3/21 1.14 4.1 13.4 2.0  9/21 1.00 4.2 13.1 2.3  5/22 1.08 4.4 13.3 (3/22) 2.2 (3/22)   11/22 1.2 4.2 13.7   8/23 1.43 4.5 12.8 2.4  7/24 1.20 4.9 12.6 2.1 (4/24)        DATE TEST EF   1/16 Cath  30-35%  CA normal   4/16  Echo   45-50 %   5/16 myoview    prior infarct?? No ischemia  2/18 ECHO  40-45 %   7/20 TPP scan  Unlikely TTR amyloid  7/20 Echo  35-40% PA press est 35          Antiarrhythmics Date Reason stopped  amiodarone  /  Optic neuritis  Sotalol  4/20         Past Medical History:  Diagnosis Date   Actinic keratosis    AICD (automatic cardioverter/defibrillator) present 03/31/2014   a. 03/2014 s/p SJM 2411-36C dual chamber AICD (serial Number [id number])- followed by Dr. Graciela Husbands   Arthritis    a. L knee.   Asthma    Basal cell carcinoma 08/29/2008   Vertex scalp.  Keratotic pattern, ulcerated.   Basal cell carcinoma 01/15/2018   Left distal medial thigh near knee. Fibroepithelioma of pinkus type   Basal cell carcinoma 01/15/2018   Right distal lat. nose ant. inferior edge. Nodular pattern.   Cardiac arrest (HCC)    a. 03/30/2014 VF arrest in setting of NICM >> CPR/defib in community >> s/p dual chamber AICD   Coronary artery disease, non-occlusive    a. cath 03/2014 at [facility]: no sig CAD (OM1 30%, CFX 30%), EF 35%   Dysplastic nevus 01/31/2020   L mid back paraspinal - severe, excision 03/21/2020   HLD (hyperlipidemia)    HTN (hypertension)    Kidney stones    Melanoma (HCC)    Melanoma resected from scalp   NICM (nonischemic cardiomyopathy) (HCC)    a. 03/2014 Echo:  Inferolateral and lateral HK, EF 50-55%, grade 1 diastolic dysfunction, mild MR, mild LAE, mild RAE, trivial TR, no effusion; 09/2018 Echo: EF 35-40%, impaired relaxation, glob HK, sev apical HK. Nl RV fxn. RVSP . Mildly dil LA.   Paroxysmal atrial fibrillation (HCC) 03/28/2015   Retinal artery occlusion     Past Surgical History:  Procedure  Laterality Date   adenomatous polyps     CARDIAC CATHETERIZATION  03/30/2014   CARDIOVERSION  03/30/2014   COLONOSCOPY WITH PROPOFOL N/A 03/24/2017   Procedure: COLONOSCOPY WITH PROPOFOL;  Surgeon: Christena Deem, MD;  Location: [facility] ENDOSCOPY;  Service: Endoscopy;  Laterality: N/A;   COLONOSCOPY WITH PROPOFOL N/A 01/07/2019   Procedure: COLONOSCOPY WITH PROPOFOL;  Surgeon: Toledo, Boykin Nearing, MD;  Location: [facility] ENDOSCOPY;  Service: Gastroenterology;  Laterality: N/A;   CYSTOSCOPY W/ LITHOLAPAXY / EHL     IMPLANTABLE CARDIOVERTER DEFIBRILLATOR IMPLANT N/A 03/31/2014   Procedure: IMPLANTABLE CARDIOVERTER DEFIBRILLATOR IMPLANT;  Surgeon: Marinus Maw, MD;  Location: [facility] CATH LAB;  Service: Cardiovascular;  Laterality: N/A;   JOINT REPLACEMENT     TONSILLECTOMY     "as a kid"   TOTAL KNEE ARTHROPLASTY Left ~ 2010   TOTAL KNEE ARTHROPLASTY  WITH REVISION COMPONENTS Left ~ 2011   VARICOSE VEIN SURGERY Left ~ 2014    Current Outpatient Medications  Medication Sig Dispense Refill   apixaban (ELIQUIS) 5 MG TABS tablet Take 1 tablet (5 mg total) by mouth 2 (two) times daily. 180 tablet 3   atorvastatin (LIPITOR) 10 MG tablet Take 1 tablet (10 mg total) by mouth daily. 90 tablet 3   Azelastine HCl 137 MCG/SPRAY SOLN SPARY 1 SPRAY PER NARE DAILY 30 mL 1   carvedilol (COREG) 25 MG tablet TAKE 1 TABLET BY MOUTH 2 TIMES DAILY WITH A MEAL. TAKE 1 TABLET BY MOUTH 2 TIMES DAILY WITH MEALS 180 tablet 3   cetirizine (ZYRTEC) 10 MG tablet Take 1 tablet (10 mg total) by mouth daily. Take 1 tablet (10 mg) by mouth once daily 30 tablet 2   fluticasone-salmeterol (WIXELA INHUB) 250-50 MCG/ACT AEPB TAKE 1 PUFF BY MOUTH TWICE A DAY 60 each 5   furosemide (LASIX) 40 MG tablet TAKE 1 TABLET BY MOUTH TWICE A DAY 2 DAYS A WEEK AND 1 TABLET ONCE A DAY ALL OTHER DAYS. 108 tablet 3   levothyroxine (SYNTHROID) 75 MCG tablet Take 1 tablet (75 mcg total) by mouth daily before breakfast. 90 tablet 0   losartan (COZAAR) 25 MG tablet TAKE 1 TABLET (25 MG TOTAL) BY MOUTH DAILY. 90 tablet 2   sotalol (BETAPACE) 80 MG tablet Take 1 tablet (80 mg total) by mouth 2 (two) times daily. 180 tablet 3   spironolactone (ALDACTONE) 25 MG tablet Take 1 tablet (25 mg) by mouth once daily 90 tablet 1   No current facility-administered medications for this visit.   Facility-Administered Medications Ordered in Other Visits  Medication Dose Route Frequency Provider Last Rate Last Admin   technetium tetrofosmin (TC-MYOVIEW) injection 23.3 millicurie  23.3 millicurie Intravenous Once PRN Pricilla Riffle, MD        Allergies  Allergen Reactions   Entresto [Sacubitril-Valsartan]     hallucinations    Review of Systems negative except from HPI and PMH  Physical Exam  There were no vitals taken for this visit. Well developed and well nourished in no acute distress HENT  normal Neck supple with JVP-flat Clear Device pocket well healed; without hematoma or erythema.  There is no tethering  Regular rate and rhythm, no *** gallop No ***/*** murmur Abd-soft with active BS No Clubbing cyanosis *** edema Skin-warm and dry A & Oriented  Grossly normal sensory and motor function  ECG ***  Device function is ***normal. 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  Blood pressure is little bit higher.  Will continue his carvedilol 3.25, losartan 25 and the Aldactone 25.\  Interval atrial flutter, not significant burden.  Continue the sotalol 20 mg twice daily  Will recheck his creatinine clearance, last time it had gone up to 1.4 from 1.0-1.2 range  No clinical bleeding, however, we will recheck the hemoglobin it  also decreased at the last visit.   He is volume overloaded.  We discussed the trade off of urinating with heart failure manifested by dyspnea and peripheral edema, potential risks of edema if he were to develop cellulitis with risk of infection.  He is inclined to try to be a little bit more aggressive with his diuretics.  I recommendation will be informed by his renal function

## 2022-11-05 ENCOUNTER — Ambulatory Visit: Payer: Medicare Other | Attending: Internal Medicine | Admitting: Internal Medicine

## 2022-11-05 ENCOUNTER — Encounter: Payer: Self-pay | Admitting: Internal Medicine

## 2022-11-05 VITALS — BP 94/52 | HR 61 | Ht 63.0 in | Wt 210.0 lb

## 2022-11-05 DIAGNOSIS — Z95810 Presence of automatic (implantable) cardiac defibrillator: Secondary | ICD-10-CM

## 2022-11-05 DIAGNOSIS — I5022 Chronic systolic (congestive) heart failure: Secondary | ICD-10-CM | POA: Diagnosis not present

## 2022-11-05 DIAGNOSIS — I495 Sick sinus syndrome: Secondary | ICD-10-CM | POA: Diagnosis not present

## 2022-11-05 DIAGNOSIS — I469 Cardiac arrest, cause unspecified: Secondary | ICD-10-CM | POA: Diagnosis not present

## 2022-11-05 DIAGNOSIS — I428 Other cardiomyopathies: Secondary | ICD-10-CM | POA: Diagnosis not present

## 2022-11-05 MED ORDER — CARVEDILOL 12.5 MG PO TABS: 12.5000 mg | ORAL_TABLET | Freq: Two times a day (BID) | ORAL | 3 refills | Status: DC

## 2022-11-05 NOTE — Patient Instructions (Signed)
Medication Instructions:  Decrease Carvedilol to 12.5 mg twice daily   *If you need a refill on your cardiac medications before your next appointment, please call your pharmacy*   Follow-Up: At [facility], you and your health needs are our priority.  As part of our continuing mission to provide you with exceptional heart care, we have created designated Provider Care Teams.  These Care Teams include your primary Cardiologist (physician) and Advanced Practice Providers (APPs -  Physician Assistants and Nurse Practitioners) who all work together to provide you with the care you need, when you need it.  We recommend signing up for the patient portal called "MyChart".  Sign up information is provided on this After Visit Summary.  MyChart is used to connect with patients for Virtual Visits (Telemedicine).  Patients are able to view lab/test results, encounter notes, upcoming appointments, etc.  Non-urgent messages can be sent to your provider as well.   To learn more about what you can do with MyChart, go to [email or web address].    Your next appointment:   6 month(s)  Provider:   Sherryl Manges, MD    Other Instructions Please check with PCP. [facility] Clinical research associate.

## 2022-11-07 ENCOUNTER — Other Ambulatory Visit: Payer: Self-pay | Admitting: Internal Medicine

## 2022-11-12 NOTE — Progress Notes (Signed)
Remote ICD transmission.   

## 2022-12-06 ENCOUNTER — Other Ambulatory Visit: Payer: Self-pay | Admitting: Internal Medicine

## 2022-12-06 DIAGNOSIS — I5022 Chronic systolic (congestive) heart failure: Secondary | ICD-10-CM

## 2022-12-06 DIAGNOSIS — I495 Sick sinus syndrome: Secondary | ICD-10-CM

## 2022-12-06 DIAGNOSIS — I469 Cardiac arrest, cause unspecified: Secondary | ICD-10-CM

## 2022-12-06 DIAGNOSIS — Z95810 Presence of automatic (implantable) cardiac defibrillator: Secondary | ICD-10-CM

## 2022-12-06 DIAGNOSIS — I428 Other cardiomyopathies: Secondary | ICD-10-CM

## 2023-01-03 ENCOUNTER — Other Ambulatory Visit: Payer: Self-pay | Admitting: Internal Medicine

## 2023-01-03 DIAGNOSIS — E039 Hypothyroidism, unspecified: Secondary | ICD-10-CM

## 2023-01-15 ENCOUNTER — Other Ambulatory Visit: Payer: Self-pay

## 2023-01-15 ENCOUNTER — Other Ambulatory Visit: Payer: Medicare Other

## 2023-01-15 ENCOUNTER — Ambulatory Visit (INDEPENDENT_AMBULATORY_CARE_PROVIDER_SITE_OTHER): Payer: Medicare Other | Admitting: Internal Medicine

## 2023-01-15 ENCOUNTER — Encounter: Payer: Self-pay | Admitting: Internal Medicine

## 2023-01-15 VITALS — BP 112/76 | HR 69 | Ht 63.0 in | Wt 219.8 lb

## 2023-01-15 DIAGNOSIS — E782 Mixed hyperlipidemia: Secondary | ICD-10-CM

## 2023-01-15 DIAGNOSIS — Z0001 Encounter for general adult medical examination with abnormal findings: Secondary | ICD-10-CM

## 2023-01-15 DIAGNOSIS — J42 Unspecified chronic bronchitis: Secondary | ICD-10-CM | POA: Diagnosis not present

## 2023-01-15 DIAGNOSIS — E039 Hypothyroidism, unspecified: Secondary | ICD-10-CM

## 2023-01-15 DIAGNOSIS — I10 Essential (primary) hypertension: Secondary | ICD-10-CM | POA: Diagnosis not present

## 2023-01-15 DIAGNOSIS — J410 Simple chronic bronchitis: Secondary | ICD-10-CM | POA: Diagnosis not present

## 2023-01-15 MED ORDER — BREZTRI AEROSPHERE 160-9-4.8 MCG/ACT IN AERO: 2.0000 | INHALATION_SPRAY | Freq: Two times a day (BID) | RESPIRATORY_TRACT | 2 refills | Status: AC

## 2023-01-15 MED ORDER — FLUTICASONE-SALMETEROL 250-50 MCG/ACT IN AEPB: INHALATION_SPRAY | RESPIRATORY_TRACT | 5 refills | Status: DC

## 2023-01-15 NOTE — Progress Notes (Signed)
Established Patient Office Visit  Subjective:  Patient ID: Ethan Vega., male    DOB: 09-01-1945  Age: 77 y.o. MRN: [id number]  Chief Complaint  Patient presents with   Annual Exam    AWV    No new complaints, here for AWV, lab review and medication refills. Gained weight but denies leg swelling or SOB.    No other concerns at this time.   Past Medical History:  Diagnosis Date   Actinic keratosis    AICD (automatic cardioverter/defibrillator) present 03/31/2014   a. 03/2014 Jayce Kainz/p SJM 2411-36C dual chamber AICD (serial Number [id number])- followed by Dr. Graciela Husbands   Arthritis    a. L knee.   Asthma    Basal cell carcinoma 08/29/2008   Vertex scalp. Keratotic pattern, ulcerated.   Basal cell carcinoma 01/15/2018   Left distal medial thigh near knee. Fibroepithelioma of pinkus type   Basal cell carcinoma 01/15/2018   Right distal lat. nose ant. inferior edge. Nodular pattern.   Cardiac arrest (HCC)    a. 03/30/2014 VF arrest in setting of NICM >> CPR/defib in community >> Braley Luckenbaugh/p dual chamber AICD   Coronary artery disease, non-occlusive    a. cath 03/2014 at [facility]: no sig CAD (OM1 30%, CFX 30%), EF 35%   Dysplastic nevus 01/31/2020   L mid back paraspinal - severe, excision 03/21/2020   HLD (hyperlipidemia)    HTN (hypertension)    Kidney stones    Melanoma (HCC)    Melanoma resected from scalp   NICM (nonischemic cardiomyopathy) (HCC)    a. 03/2014 Echo:  Inferolateral and lateral HK, EF 50-55%, grade 1 diastolic dysfunction, mild MR, mild LAE, mild RAE, trivial TR, no effusion; 09/2018 Echo: EF 35-40%, impaired relaxation, glob HK, sev apical HK. Nl RV fxn. RVSP . Mildly dil LA.   Paroxysmal atrial fibrillation (HCC) 03/28/2015   Retinal artery occlusion     Past Surgical History:  Procedure Laterality Date   adenomatous polyps     CARDIAC CATHETERIZATION  03/30/2014   CARDIOVERSION  03/30/2014   COLONOSCOPY WITH PROPOFOL N/A 03/24/2017   Procedure: COLONOSCOPY WITH  PROPOFOL;  Surgeon: Christena Deem, MD;  Location: [facility] ENDOSCOPY;  Service: Endoscopy;  Laterality: N/A;   COLONOSCOPY WITH PROPOFOL N/A 01/07/2019   Procedure: COLONOSCOPY WITH PROPOFOL;  Surgeon: Toledo, Boykin Nearing, MD;  Location: [facility] ENDOSCOPY;  Service: Gastroenterology;  Laterality: N/A;   CYSTOSCOPY W/ LITHOLAPAXY / EHL     IMPLANTABLE CARDIOVERTER DEFIBRILLATOR IMPLANT N/A 03/31/2014   Procedure: IMPLANTABLE CARDIOVERTER DEFIBRILLATOR IMPLANT;  Surgeon: Marinus Maw, MD;  Location: [facility] CATH LAB;  Service: Cardiovascular;  Laterality: N/A;   JOINT REPLACEMENT     TONSILLECTOMY     "as a kid"   TOTAL KNEE ARTHROPLASTY Left ~ 2010   TOTAL KNEE ARTHROPLASTY WITH REVISION COMPONENTS Left ~ 2011   VARICOSE VEIN SURGERY Left ~ 2014    Social History   Socioeconomic History   Marital status: Divorced    Spouse name: Not on file   Number of children: Not on file   Years of education: Not on file   Highest education level: Not on file  Occupational History   Occupation: retired    Comment: Chartered certified accountant at [employer]  Tobacco Use   Smoking status: Never   Smokeless tobacco: Never  Vaping Use   Vaping status: Never Used  Substance and Sexual Activity   Alcohol use: No   Drug use: No   Sexual activity: Not Currently  Other  Topics Concern   Not on file  Social History Narrative   Not on file   Social Determinants of Health   Financial Resource Strain: Not on file  Food Insecurity: Not on file  Transportation Needs: Not on file  Physical Activity: Not on file  Stress: Not on file  Social Connections: Not on file  Intimate Partner Violence: Not on file    Family History  Problem Relation Age of Onset   Esophageal cancer Mother        died at 46   Heart attack Father        died at 63   Hypertension Brother    Colon polyps Brother     Allergies  Allergen Reactions   Entresto [Sacubitril-Valsartan]     hallucinations    Review of Systems   Constitutional:  Negative for malaise/fatigue and weight loss (Gained 9 lbs).  HENT: Negative.  Negative for nosebleeds.   Eyes: Negative.   Respiratory: Negative.  Negative for shortness of breath.   Cardiovascular: Negative.  Negative for chest pain, palpitations and orthopnea.  Gastrointestinal: Negative.   Genitourinary: Negative.   Musculoskeletal:  Negative for back pain.  Skin: Negative.   Neurological: Negative.  Negative for dizziness.  Endo/Heme/Allergies: Negative.   Psychiatric/Behavioral:  The patient does not have insomnia.        Objective:   BP 112/76   Pulse 69   Ht 5\' 3"  (1.6 m)   Wt 219 lb 12.8 oz (99.7 kg)   SpO2 97%   BMI 38.94 kg/m   Vitals:   01/15/23 0830  BP: 112/76  Pulse: 69  Height: 5\' 3"  (1.6 m)  Weight: 219 lb 12.8 oz (99.7 kg)  SpO2: 97%  BMI (Calculated): 38.95    Physical Exam Vitals reviewed.  Constitutional:      General: He is not in acute distress.    Appearance: Normal appearance. He is obese.  HENT:     Head: Normocephalic.     Left Ear: There is no impacted cerumen.     Nose: Nose normal.     Mouth/Throat:     Mouth: Mucous membranes are moist.     Pharynx: No posterior oropharyngeal erythema.  Eyes:     Extraocular Movements: Extraocular movements intact.     Pupils: Pupils are equal, round, and reactive to light.  Cardiovascular:     Rate and Rhythm: Normal rate and regular rhythm.     Chest Wall: PMI is not displaced.     Pulses: Normal pulses.     Heart sounds: Normal heart sounds. No murmur heard. Pulmonary:     Effort: Pulmonary effort is normal.     Breath sounds: Normal air entry. No rhonchi or rales.  Chest:     Comments: Left ICD pocket not erythematous or fluctuant Abdominal:     General: Abdomen is flat. Bowel sounds are normal. There is no distension.     Palpations: Abdomen is soft. There is no hepatomegaly, splenomegaly or mass.     Tenderness: There is no abdominal tenderness.  Musculoskeletal:         General: Normal range of motion.     Cervical back: Normal range of motion and neck supple.     Right lower leg: No edema.     Left lower leg: No edema.  Skin:    General: Skin is warm and dry.  Neurological:     General: No focal deficit present.     Mental Status: He  is alert and oriented to person, place, and time.     Cranial Nerves: No cranial nerve deficit.     Motor: No weakness.  Psychiatric:        Mood and Affect: Mood normal.        Behavior: Behavior normal.    No results found for any visits on 01/15/23.      Assessment & Plan:  As per problem list. Problem List Items Addressed This Visit       Cardiovascular and Mediastinum   HTN (hypertension)     Endocrine   Acquired hypothyroidism - Primary     Other   Mixed hyperlipidemia   Other Visit Diagnoses     Simple chronic bronchitis (HCC)           Return in about 3 months (around 04/17/2023) for fu with labs prior.   Total time spent: 35 minutes  Luna Fuse, MD  01/15/2023   This document may have been prepared by [facility] Voice Recognition software and as such may include unintentional dictation errors.

## 2023-01-16 LAB — COMPREHENSIVE METABOLIC PANEL
ALT: 20 [IU]/L (ref 0–44)
AST: 30 [IU]/L (ref 0–40)
Albumin: 4.3 g/dL (ref 3.8–4.8)
Alkaline Phosphatase: 68 [IU]/L (ref 44–121)
BUN/Creatinine Ratio: 18 (ref 10–24)
BUN: 23 mg/dL (ref 8–27)
Bilirubin Total: 2.7 mg/dL — ABNORMAL HIGH (ref 0.0–1.2)
CO2: 23 mmol/L (ref 20–29)
Calcium: 9.4 mg/dL (ref 8.6–10.2)
Chloride: 105 mmol/L (ref 96–106)
Creatinine, Ser: 1.29 mg/dL — ABNORMAL HIGH (ref 0.76–1.27)
Globulin, Total: 2.2 g/dL (ref 1.5–4.5)
Glucose: 96 mg/dL (ref 70–99)
Potassium: 5.2 mmol/L (ref 3.5–5.2)
Sodium: 142 mmol/L (ref 134–144)
Total Protein: 6.5 g/dL (ref 6.0–8.5)
eGFR: 56 mL/min/{1.73_m2} — ABNORMAL LOW (ref >59)

## 2023-01-16 LAB — LIPID PANEL
Chol/HDL Ratio: 2.7 ratio (ref 0.0–5.0)
Cholesterol, Total: 126 mg/dL (ref 100–199)
HDL: 47 mg/dL (ref >39)
LDL Chol Calc (NIH): 63 mg/dL (ref 0–99)
Triglycerides: 79 mg/dL (ref 0–149)
VLDL Cholesterol Cal: 16 mg/dL (ref 5–40)

## 2023-01-21 NOTE — Progress Notes (Signed)
Patient notified

## 2023-01-24 ENCOUNTER — Encounter: Payer: Self-pay | Admitting: Internal Medicine

## 2023-01-24 ENCOUNTER — Other Ambulatory Visit: Payer: Self-pay

## 2023-01-24 ENCOUNTER — Telehealth: Payer: Self-pay | Admitting: Internal Medicine

## 2023-01-24 MED ORDER — APIXABAN 5 MG PO TABS: 5.0000 mg | ORAL_TABLET | Freq: Two times a day (BID) | ORAL | 1 refills | Status: DC

## 2023-01-24 NOTE — Telephone Encounter (Signed)
Pt c/o medication issue:  1. Name of Medication: apixaban (ELIQUIS) 5 MG TABS tablet   2. How are you currently taking this medication (dosage and times per day)?   3. Are you having a reaction (difficulty breathing--STAT)?   4. What is your medication issue?  Patient's daughter is requesting call back to get clarification on medication and where it was sent to. She states she was told to have it sent to a online pharmacy out of [place]. Requesting call back to discuss further.

## 2023-01-24 NOTE — Telephone Encounter (Signed)
The patient's daughter stated that they no longer use [facility] and requested if a prescription could be sent to [place]. The nurse informed the daughter that, unfortunately, we cannot send a prescription but can print one for the patient to fax. The patient's daughter verbalized understanding.  Will forward to pharm D for approval.

## 2023-01-24 NOTE — Telephone Encounter (Signed)
Prescription refill request for Eliquis received. Indication:afib Last office visit:8/24 Scr:1.29  10/24 Age: 77 Weight:99.7  kg  Prescription refilled

## 2023-01-26 NOTE — Telephone Encounter (Signed)
5 mg bid dose is correct.  If Dr. Graciela Husbands is okay with this, you can print a prescription for him to sign

## 2023-01-27 ENCOUNTER — Ambulatory Visit (INDEPENDENT_AMBULATORY_CARE_PROVIDER_SITE_OTHER): Payer: Medicare Other

## 2023-01-27 ENCOUNTER — Encounter: Payer: Self-pay | Admitting: Internal Medicine

## 2023-01-27 DIAGNOSIS — I495 Sick sinus syndrome: Secondary | ICD-10-CM | POA: Diagnosis not present

## 2023-01-27 MED ORDER — APIXABAN 5 MG PO TABS: 5.0000 mg | ORAL_TABLET | Freq: Two times a day (BID) | ORAL | 3 refills | Status: DC

## 2023-01-27 NOTE — Telephone Encounter (Signed)
Prescription signed and placed up front for pick up. 

## 2023-01-29 LAB — CUP PACEART REMOTE DEVICE CHECK
Battery Remaining Longevity: 5 mo
Battery Remaining Percentage: 5 %
Battery Voltage: 2.62 V
Brady Statistic AP VP Percent: 1 %
Brady Statistic AP VS Percent: 99 %
Brady Statistic AS VP Percent: 1 %
Brady Statistic AS VS Percent: 1 %
Brady Statistic RA Percent Paced: 98 %
Brady Statistic RV Percent Paced: 1 %
Date Time Interrogation Session: 20241111020016
HighPow Impedance: 63 Ohm
HighPow Impedance: 63 Ohm
Implantable Lead Connection Status: 753985
Implantable Lead Connection Status: 753985
Implantable Lead Implant Date: 20160114
Implantable Lead Implant Date: 20160114
Implantable Lead Location: 753859
Implantable Lead Location: 753860
Implantable Lead Model: 7122
Implantable Pulse Generator Implant Date: 20160114
Lead Channel Impedance Value: 350 Ohm
Lead Channel Impedance Value: 380 Ohm
Lead Channel Pacing Threshold Amplitude: 0.625 V
Lead Channel Pacing Threshold Amplitude: 1.125 V
Lead Channel Pacing Threshold Pulse Width: 0.5 ms
Lead Channel Pacing Threshold Pulse Width: 0.6 ms
Lead Channel Sensing Intrinsic Amplitude: 2.2 mV
Lead Channel Sensing Intrinsic Amplitude: 8 mV
Lead Channel Setting Pacing Amplitude: 1.375
Lead Channel Setting Pacing Amplitude: 1.625
Lead Channel Setting Pacing Pulse Width: 0.6 ms
Lead Channel Setting Sensing Sensitivity: 0.5 mV
Pulse Gen Serial Number: 7241695

## 2023-02-07 DIAGNOSIS — H47011 Ischemic optic neuropathy, right eye: Secondary | ICD-10-CM | POA: Diagnosis not present

## 2023-02-07 DIAGNOSIS — H47012 Ischemic optic neuropathy, left eye: Secondary | ICD-10-CM | POA: Diagnosis not present

## 2023-02-07 DIAGNOSIS — H2512 Age-related nuclear cataract, left eye: Secondary | ICD-10-CM | POA: Diagnosis not present

## 2023-02-07 DIAGNOSIS — H2511 Age-related nuclear cataract, right eye: Secondary | ICD-10-CM | POA: Diagnosis not present

## 2023-02-21 ENCOUNTER — Ambulatory Visit (INDEPENDENT_AMBULATORY_CARE_PROVIDER_SITE_OTHER): Payer: Medicare Other | Admitting: Internal Medicine

## 2023-02-21 VITALS — BP 108/72 | HR 60 | Ht 63.0 in | Wt 220.4 lb

## 2023-02-21 DIAGNOSIS — E782 Mixed hyperlipidemia: Secondary | ICD-10-CM | POA: Diagnosis not present

## 2023-02-21 DIAGNOSIS — Z0130 Encounter for examination of blood pressure without abnormal findings: Secondary | ICD-10-CM

## 2023-02-21 DIAGNOSIS — J301 Allergic rhinitis due to pollen: Secondary | ICD-10-CM | POA: Diagnosis not present

## 2023-02-21 DIAGNOSIS — M545 Low back pain, unspecified: Secondary | ICD-10-CM

## 2023-02-21 DIAGNOSIS — F0280 Dementia in other diseases classified elsewhere without behavioral disturbance: Secondary | ICD-10-CM

## 2023-02-21 DIAGNOSIS — I10 Essential (primary) hypertension: Secondary | ICD-10-CM | POA: Diagnosis not present

## 2023-02-21 DIAGNOSIS — F03B0 Unspecified dementia, moderate, without behavioral disturbance, psychotic disturbance, mood disturbance, and anxiety: Secondary | ICD-10-CM

## 2023-02-21 NOTE — Progress Notes (Signed)
Remote ICD transmission.   

## 2023-02-21 NOTE — Progress Notes (Signed)
Established Patient Office Visit  Subjective:  Patient ID: Ethan Saint., male    DOB: 1945/12/09  Age: 77 y.o. MRN: [id number]  Chief Complaint  Patient presents with   Follow-up    Here for memory eval. Refer to scanned documents.    No other concerns at this time.   Past Medical History:  Diagnosis Date   Actinic keratosis    AICD (automatic cardioverter/defibrillator) present 03/31/2014   a. 03/2014 Peyton Rossner/p SJM 2411-36C dual chamber AICD (serial Number [id number])- followed by Dr. Graciela Husbands   Arthritis    a. L knee.   Asthma    Basal cell carcinoma 08/29/2008   Vertex scalp. Keratotic pattern, ulcerated.   Basal cell carcinoma 01/15/2018   Left distal medial thigh near knee. Fibroepithelioma of pinkus type   Basal cell carcinoma 01/15/2018   Right distal lat. nose ant. inferior edge. Nodular pattern.   Cardiac arrest (HCC)    a. 03/30/2014 VF arrest in setting of NICM >> CPR/defib in community >> Cass Edinger/p dual chamber AICD   Coronary artery disease, non-occlusive    a. cath 03/2014 at [facility]: no sig CAD (OM1 30%, CFX 30%), EF 35%   Dysplastic nevus 01/31/2020   L mid back paraspinal - severe, excision 03/21/2020   HLD (hyperlipidemia)    HTN (hypertension)    Kidney stones    Melanoma (HCC)    Melanoma resected from scalp   NICM (nonischemic cardiomyopathy) (HCC)    a. 03/2014 Echo:  Inferolateral and lateral HK, EF 50-55%, grade 1 diastolic dysfunction, mild MR, mild LAE, mild RAE, trivial TR, no effusion; 09/2018 Echo: EF 35-40%, impaired relaxation, glob HK, sev apical HK. Nl RV fxn. RVSP . Mildly dil LA.   Paroxysmal atrial fibrillation (HCC) 03/28/2015   Retinal artery occlusion     Past Surgical History:  Procedure Laterality Date   adenomatous polyps     CARDIAC CATHETERIZATION  03/30/2014   CARDIOVERSION  03/30/2014   COLONOSCOPY WITH PROPOFOL N/A 03/24/2017   Procedure: COLONOSCOPY WITH PROPOFOL;  Surgeon: Christena Deem, MD;  Location: [facility] ENDOSCOPY;   Service: Endoscopy;  Laterality: N/A;   COLONOSCOPY WITH PROPOFOL N/A 01/07/2019   Procedure: COLONOSCOPY WITH PROPOFOL;  Surgeon: Toledo, Boykin Nearing, MD;  Location: [facility] ENDOSCOPY;  Service: Gastroenterology;  Laterality: N/A;   CYSTOSCOPY W/ LITHOLAPAXY / EHL     IMPLANTABLE CARDIOVERTER DEFIBRILLATOR IMPLANT N/A 03/31/2014   Procedure: IMPLANTABLE CARDIOVERTER DEFIBRILLATOR IMPLANT;  Surgeon: Marinus Maw, MD;  Location: [facility] CATH LAB;  Service: Cardiovascular;  Laterality: N/A;   JOINT REPLACEMENT     TONSILLECTOMY     "as a kid"   TOTAL KNEE ARTHROPLASTY Left ~ 2010   TOTAL KNEE ARTHROPLASTY WITH REVISION COMPONENTS Left ~ 2011   VARICOSE VEIN SURGERY Left ~ 2014    Social History   Socioeconomic History   Marital status: Divorced    Spouse name: Not on file   Number of children: Not on file   Years of education: Not on file   Highest education level: Not on file  Occupational History   Occupation: retired    Comment: Chartered certified accountant at [employer]  Tobacco Use   Smoking status: Never   Smokeless tobacco: Never  Vaping Use   Vaping status: Never Used  Substance and Sexual Activity   Alcohol use: No   Drug use: No   Sexual activity: Not Currently  Other Topics Concern   Not on file  Social History Narrative   Not on file  Social Determinants of Health   Financial Resource Strain: Not on file  Food Insecurity: Not on file  Transportation Needs: Not on file  Physical Activity: Not on file  Stress: Not on file  Social Connections: Not on file  Intimate Partner Violence: Not on file    Family History  Problem Relation Age of Onset   Esophageal cancer Mother        died at 63   Heart attack Father        died at 73   Hypertension Brother    Colon polyps Brother     Allergies  Allergen Reactions   Entresto [Sacubitril-Valsartan]     hallucinations    Outpatient Medications Prior to Visit  Medication Sig   apixaban (ELIQUIS) 5 MG TABS tablet Take 1  tablet (5 mg total) by mouth 2 (two) times daily.   atorvastatin (LIPITOR) 10 MG tablet Take 1 tablet (10 mg total) by mouth daily.   Azelastine HCl 137 MCG/SPRAY SOLN SPARY 1 SPRAY PER NARE DAILY   Budeson-Glycopyrrol-Formoterol (BREZTRI AEROSPHERE) 160-9-4.8 MCG/ACT AERO Inhale 2 puffs into the lungs 2 (two) times daily.   carvedilol (COREG) 12.5 MG tablet Take 1 tablet (12.5 mg total) by mouth 2 (two) times daily with a meal.   cetirizine (ZYRTEC) 10 MG tablet Take 1 tablet (10 mg total) by mouth daily. Take 1 tablet (10 mg) by mouth once daily   fluticasone-salmeterol (WIXELA INHUB) 250-50 MCG/ACT AEPB TAKE 1 PUFF BY MOUTH TWICE A DAY   furosemide (LASIX) 40 MG tablet TAKE 1 TABLET BY MOUTH TWICE A DAY 2 DAYS A WEEK AND 1 TABLET ONCE A DAY ALL OTHER DAYS.   levothyroxine (SYNTHROID) 75 MCG tablet TAKE 1 TABLET BY MOUTH DAILY BEFORE BREAKFAST.   losartan (COZAAR) 25 MG tablet TAKE 1 TABLET (25 MG TOTAL) BY MOUTH DAILY.   sotalol (BETAPACE) 80 MG tablet Take 1 tablet (80 mg total) by mouth 2 (two) times daily.   spironolactone (ALDACTONE) 25 MG tablet TAKE 1 TABLET BY MOUTH EVERY DAY   Facility-Administered Medications Prior to Visit  Medication Dose Route Frequency Provider   technetium tetrofosmin (TC-MYOVIEW) injection 23.3 millicurie  23.3 millicurie Intravenous Once PRN Pricilla Riffle, MD    Review of Systems  All other systems reviewed and are negative.      Objective:   BP 108/72   Pulse 60   Ht 5\' 3"  (1.6 m)   Wt 220 lb 6.4 oz (100 kg)   SpO2 98%   BMI 39.04 kg/m   Vitals:   02/21/23 1506  BP: 108/72  Pulse: 60  Height: 5\' 3"  (1.6 m)  Weight: 220 lb 6.4 oz (100 kg)  SpO2: 98%  BMI (Calculated): 39.05    Physical Exam Vitals reviewed.  Constitutional:      General: He is not in acute distress.    Appearance: Normal appearance. He is obese.  HENT:     Head: Normocephalic.     Left Ear: There is no impacted cerumen.     Nose: Nose normal.     Mouth/Throat:      Mouth: Mucous membranes are moist.     Pharynx: No posterior oropharyngeal erythema.  Eyes:     Extraocular Movements: Extraocular movements intact.     Pupils: Pupils are equal, round, and reactive to light.  Cardiovascular:     Rate and Rhythm: Normal rate and regular rhythm.     Chest Wall: PMI is not displaced.  Pulses: Normal pulses.     Heart sounds: Normal heart sounds. No murmur heard. Pulmonary:     Effort: Pulmonary effort is normal.     Breath sounds: Normal air entry. No rhonchi or rales.  Chest:     Comments: Left ICD pocket not erythematous or fluctuant Abdominal:     General: Abdomen is flat. Bowel sounds are normal. There is no distension.     Palpations: Abdomen is soft. There is no hepatomegaly, splenomegaly or mass.     Tenderness: There is no abdominal tenderness.  Musculoskeletal:        General: Normal range of motion.     Cervical back: Normal range of motion and neck supple.     Right lower leg: No edema.     Left lower leg: No edema.  Skin:    General: Skin is warm and dry.  Neurological:     General: No focal deficit present.     Mental Status: He is alert and oriented to person, place, and time.     Cranial Nerves: No cranial nerve deficit.     Motor: No weakness.  Psychiatric:        Mood and Affect: Mood normal.        Behavior: Behavior normal.      No results found for any visits on 02/21/23.      Assessment & Plan:  As per problem list  Problem List Items Addressed This Visit   None Visit Diagnoses     Moderate dementia without behavioral disturbance, psychotic disturbance, mood disturbance, or anxiety, unspecified dementia type (HCC)    -  Primary   Relevant Orders   CT HEAD WO CONTRAST ( )       Return in about 2 months (around 04/24/2023).   Total time spent: 30 minutes  Luna Fuse, MD  02/21/2023   This document may have been prepared by [facility] Voice Recognition software and as such may include  unintentional dictation errors.

## 2023-03-03 ENCOUNTER — Ambulatory Visit (INDEPENDENT_AMBULATORY_CARE_PROVIDER_SITE_OTHER): Payer: Medicare Other

## 2023-03-03 DIAGNOSIS — I495 Sick sinus syndrome: Secondary | ICD-10-CM

## 2023-03-04 ENCOUNTER — Ambulatory Visit: Payer: Medicare Other

## 2023-03-04 DIAGNOSIS — F0390 Unspecified dementia without behavioral disturbance: Secondary | ICD-10-CM

## 2023-03-04 DIAGNOSIS — F03B0 Unspecified dementia, moderate, without behavioral disturbance, psychotic disturbance, mood disturbance, and anxiety: Secondary | ICD-10-CM

## 2023-03-10 ENCOUNTER — Encounter: Payer: Self-pay | Admitting: Internal Medicine

## 2023-03-12 LAB — CUP PACEART REMOTE DEVICE CHECK
Battery Remaining Longevity: 5 mo
Battery Remaining Percentage: 5 %
Battery Voltage: 2.62 V
Brady Statistic AP VP Percent: 1 %
Brady Statistic AP VS Percent: 99 %
Brady Statistic AS VP Percent: 1 %
Brady Statistic AS VS Percent: 1 %
Brady Statistic RA Percent Paced: 98 %
Brady Statistic RV Percent Paced: 1 %
Date Time Interrogation Session: 20241224103002
HighPow Impedance: 63 Ohm
HighPow Impedance: 63 Ohm
Implantable Lead Connection Status: 753985
Implantable Lead Connection Status: 753985
Implantable Lead Implant Date: 20160114
Implantable Lead Implant Date: 20160114
Implantable Lead Location: 753859
Implantable Lead Location: 753860
Implantable Lead Model: 7122
Implantable Pulse Generator Implant Date: 20160114
Lead Channel Impedance Value: 340 Ohm
Lead Channel Impedance Value: 350 Ohm
Lead Channel Pacing Threshold Amplitude: 0.625 V
Lead Channel Pacing Threshold Amplitude: 1.125 V
Lead Channel Pacing Threshold Pulse Width: 0.5 ms
Lead Channel Pacing Threshold Pulse Width: 0.6 ms
Lead Channel Sensing Intrinsic Amplitude: 2.1 mV
Lead Channel Sensing Intrinsic Amplitude: 7.6 mV
Lead Channel Setting Pacing Amplitude: 1.375
Lead Channel Setting Pacing Amplitude: 1.625
Lead Channel Setting Pacing Pulse Width: 0.6 ms
Lead Channel Setting Sensing Sensitivity: 0.5 mV
Pulse Gen Serial Number: 7241695

## 2023-03-20 ENCOUNTER — Encounter: Payer: Self-pay | Admitting: Internal Medicine

## 2023-03-25 ENCOUNTER — Other Ambulatory Visit: Payer: Self-pay | Admitting: Internal Medicine

## 2023-03-25 DIAGNOSIS — F03B0 Unspecified dementia, moderate, without behavioral disturbance, psychotic disturbance, mood disturbance, and anxiety: Secondary | ICD-10-CM

## 2023-03-25 MED ORDER — DONEPEZIL HCL 5 MG PO TABS: 5.0000 mg | ORAL_TABLET | Freq: Every day | ORAL | 2 refills | Status: DC

## 2023-03-26 ENCOUNTER — Encounter: Payer: Self-pay | Admitting: Internal Medicine

## 2023-04-01 NOTE — Telephone Encounter (Signed)
 See below

## 2023-04-01 NOTE — Telephone Encounter (Signed)
 Please Inform Patient  Thanks for letting us know  He is on Apixaban  for stroke risk reduction but still....  How is he doing overall   Thanks SK

## 2023-04-03 ENCOUNTER — Ambulatory Visit (INDEPENDENT_AMBULATORY_CARE_PROVIDER_SITE_OTHER): Payer: Medicare Other

## 2023-04-03 DIAGNOSIS — I495 Sick sinus syndrome: Secondary | ICD-10-CM | POA: Diagnosis not present

## 2023-04-03 LAB — CUP PACEART REMOTE DEVICE CHECK
Battery Remaining Longevity: 3 mo
Battery Remaining Percentage: 3 %
Battery Voltage: 2.6 V
Brady Statistic AP VP Percent: 1 %
Brady Statistic AP VS Percent: 99 %
Brady Statistic AS VP Percent: 1 %
Brady Statistic AS VS Percent: 1 %
Brady Statistic RA Percent Paced: 98 %
Brady Statistic RV Percent Paced: 1 %
Date Time Interrogation Session: 20250116020019
HighPow Impedance: 69 Ohm
HighPow Impedance: 69 Ohm
Implantable Lead Connection Status: 753985
Implantable Lead Connection Status: 753985
Implantable Lead Implant Date: 20160114
Implantable Lead Implant Date: 20160114
Implantable Lead Location: 753859
Implantable Lead Location: 753860
Implantable Lead Model: 7122
Implantable Pulse Generator Implant Date: 20160114
Lead Channel Impedance Value: 350 Ohm
Lead Channel Impedance Value: 380 Ohm
Lead Channel Pacing Threshold Amplitude: 0.625 V
Lead Channel Pacing Threshold Amplitude: 1.25 V
Lead Channel Pacing Threshold Pulse Width: 0.5 ms
Lead Channel Pacing Threshold Pulse Width: 0.6 ms
Lead Channel Sensing Intrinsic Amplitude: 1.7 mV
Lead Channel Sensing Intrinsic Amplitude: 8.4 mV
Lead Channel Setting Pacing Amplitude: 1.5 V
Lead Channel Setting Pacing Amplitude: 1.625
Lead Channel Setting Pacing Pulse Width: 0.6 ms
Lead Channel Setting Sensing Sensitivity: 0.5 mV
Pulse Gen Serial Number: 7241695

## 2023-04-04 ENCOUNTER — Other Ambulatory Visit: Payer: Self-pay | Admitting: Internal Medicine

## 2023-04-04 DIAGNOSIS — E039 Hypothyroidism, unspecified: Secondary | ICD-10-CM

## 2023-04-07 ENCOUNTER — Encounter: Payer: Self-pay | Admitting: Internal Medicine

## 2023-04-07 ENCOUNTER — Other Ambulatory Visit: Payer: Self-pay

## 2023-04-07 DIAGNOSIS — E039 Hypothyroidism, unspecified: Secondary | ICD-10-CM

## 2023-04-08 NOTE — Addendum Note (Signed)
Addended by: Geralyn Flash D on: 04/08/2023 12:03 PM   Modules accepted: Orders, Level of Service

## 2023-04-08 NOTE — Progress Notes (Signed)
Remote ICD transmission.   

## 2023-04-23 ENCOUNTER — Encounter: Payer: Self-pay | Admitting: Internal Medicine

## 2023-04-23 ENCOUNTER — Other Ambulatory Visit: Payer: Self-pay | Admitting: Internal Medicine

## 2023-04-23 MED ORDER — TRIAMCINOLONE ACETONIDE 55 MCG/ACT NA AERO: 1.0000 | INHALATION_SPRAY | Freq: Two times a day (BID) | NASAL | 2 refills | Status: DC

## 2023-04-28 ENCOUNTER — Ambulatory Visit: Payer: Medicare Other

## 2023-05-05 ENCOUNTER — Ambulatory Visit: Payer: Medicare Other

## 2023-05-05 DIAGNOSIS — I495 Sick sinus syndrome: Secondary | ICD-10-CM | POA: Diagnosis not present

## 2023-05-07 LAB — CUP PACEART REMOTE DEVICE CHECK
Battery Remaining Longevity: 3 mo
Battery Remaining Percentage: 3 %
Battery Voltage: 2.6 V
Brady Statistic AP VP Percent: 1 %
Brady Statistic AP VS Percent: 98 %
Brady Statistic AS VP Percent: 1 %
Brady Statistic AS VS Percent: 1 %
Brady Statistic RA Percent Paced: 97 %
Brady Statistic RV Percent Paced: 1 %
Date Time Interrogation Session: 20250216020015
HighPow Impedance: 65 Ohm
HighPow Impedance: 65 Ohm
Implantable Lead Connection Status: 753985
Implantable Lead Connection Status: 753985
Implantable Lead Implant Date: 20160114
Implantable Lead Implant Date: 20160114
Implantable Lead Location: 753859
Implantable Lead Location: 753860
Implantable Lead Model: 7122
Implantable Pulse Generator Implant Date: 20160114
Lead Channel Impedance Value: 350 Ohm
Lead Channel Impedance Value: 360 Ohm
Lead Channel Pacing Threshold Amplitude: 0.625 V
Lead Channel Pacing Threshold Amplitude: 1.25 V
Lead Channel Pacing Threshold Pulse Width: 0.5 ms
Lead Channel Pacing Threshold Pulse Width: 0.6 ms
Lead Channel Sensing Intrinsic Amplitude: 1.3 mV
Lead Channel Sensing Intrinsic Amplitude: 9 mV
Lead Channel Setting Pacing Amplitude: 1.5 V
Lead Channel Setting Pacing Amplitude: 1.625
Lead Channel Setting Pacing Pulse Width: 0.6 ms
Lead Channel Setting Sensing Sensitivity: 0.5 mV
Pulse Gen Serial Number: 7241695

## 2023-05-12 NOTE — Progress Notes (Signed)
 Remote ICD transmission.

## 2023-05-14 ENCOUNTER — Encounter: Payer: Self-pay | Admitting: Internal Medicine

## 2023-05-14 ENCOUNTER — Ambulatory Visit (INDEPENDENT_AMBULATORY_CARE_PROVIDER_SITE_OTHER): Payer: Medicare Other | Admitting: Internal Medicine

## 2023-05-14 ENCOUNTER — Telehealth: Payer: Self-pay

## 2023-05-14 VITALS — BP 108/62 | HR 63 | Temp 97.1°F | Ht 66.0 in | Wt 211.0 lb

## 2023-05-14 DIAGNOSIS — F03B0 Unspecified dementia, moderate, without behavioral disturbance, psychotic disturbance, mood disturbance, and anxiety: Secondary | ICD-10-CM

## 2023-05-14 DIAGNOSIS — J301 Allergic rhinitis due to pollen: Secondary | ICD-10-CM | POA: Diagnosis not present

## 2023-05-14 DIAGNOSIS — I10 Essential (primary) hypertension: Secondary | ICD-10-CM | POA: Diagnosis not present

## 2023-05-14 DIAGNOSIS — M545 Low back pain, unspecified: Secondary | ICD-10-CM

## 2023-05-14 DIAGNOSIS — E782 Mixed hyperlipidemia: Secondary | ICD-10-CM | POA: Diagnosis not present

## 2023-05-14 MED ORDER — DICLOFENAC SODIUM 1 % EX GEL: 4.0000 g | Freq: Two times a day (BID) | CUTANEOUS | 3 refills | Status: DC

## 2023-05-14 MED ORDER — CETIRIZINE HCL 10 MG PO TABS: 10.0000 mg | ORAL_TABLET | Freq: Every day | ORAL | 2 refills | Status: AC

## 2023-05-14 MED ORDER — DONEPEZIL HCL 10 MG PO TABS: 10.0000 mg | ORAL_TABLET | Freq: Every day | ORAL | 1 refills | Status: DC

## 2023-05-14 MED ORDER — LOSARTAN POTASSIUM 25 MG PO TABS: 25.0000 mg | ORAL_TABLET | Freq: Every day | ORAL | 2 refills | Status: DC

## 2023-05-14 NOTE — Telephone Encounter (Signed)
 Alert received from [employer]:  Alert remote transmission:  ERI reached 2/25 - route to triage  Outreach made to Pt's daughter per [facility].  Advised device ERI.  Advised would send to scheduling to set up visit to discuss procedure.  All questions answered.

## 2023-05-14 NOTE — Progress Notes (Signed)
 Established Patient Office Visit  Subjective:  Patient ID: Ethan Marling., male    DOB: 07/13/1945  Age: 78 y.o. MRN: [id number]  Chief Complaint  Patient presents with   Follow-up    2 month follow up   Back Pain    Lower back/hip pain from being hit by a log 3 days ago    Here for memory f/u and states his memory hasn't deteriorated. C/o high cost of Wixela, on review appears it was started while he was on Amiodarone which has been discontinued. Still c/o nasal congestion but didn't combine azelastine with nasacort as directed. Also c/o lower back pain relieved by topical nsaids.    No other concerns at this time.   Past Medical History:  Diagnosis Date   Actinic keratosis    AICD (automatic cardioverter/defibrillator) present 03/31/2014   a. 03/2014 Charlottie Peragine/p SJM 2411-36C dual chamber AICD (serial Number [id number])- followed by Dr. Graciela Husbands   Arthritis    a. L knee.   Asthma    Basal cell carcinoma 08/29/2008   Vertex scalp. Keratotic pattern, ulcerated.   Basal cell carcinoma 01/15/2018   Left distal medial thigh near knee. Fibroepithelioma of pinkus type   Basal cell carcinoma 01/15/2018   Right distal lat. nose ant. inferior edge. Nodular pattern.   Cardiac arrest (HCC)    a. 03/30/2014 VF arrest in setting of NICM >> CPR/defib in community >> Vanita Cannell/p dual chamber AICD   Coronary artery disease, non-occlusive    a. cath 03/2014 at [facility]: no sig CAD (OM1 30%, CFX 30%), EF 35%   Dysplastic nevus 01/31/2020   L mid back paraspinal - severe, excision 03/21/2020   HLD (hyperlipidemia)    HTN (hypertension)    Kidney stones    Melanoma (HCC)    Melanoma resected from scalp   NICM (nonischemic cardiomyopathy) (HCC)    a. 03/2014 Echo:  Inferolateral and lateral HK, EF 50-55%, grade 1 diastolic dysfunction, mild MR, mild LAE, mild RAE, trivial TR, no effusion; 09/2018 Echo: EF 35-40%, impaired relaxation, glob HK, sev apical HK. Nl RV fxn. RVSP . Mildly dil LA.   Paroxysmal atrial  fibrillation (HCC) 03/28/2015   Retinal artery occlusion     Past Surgical History:  Procedure Laterality Date   adenomatous polyps     CARDIAC CATHETERIZATION  03/30/2014   CARDIOVERSION  03/30/2014   COLONOSCOPY WITH PROPOFOL N/A 03/24/2017   Procedure: COLONOSCOPY WITH PROPOFOL;  Surgeon: Christena Deem, MD;  Location: [facility] ENDOSCOPY;  Service: Endoscopy;  Laterality: N/A;   COLONOSCOPY WITH PROPOFOL N/A 01/07/2019   Procedure: COLONOSCOPY WITH PROPOFOL;  Surgeon: Toledo, Boykin Nearing, MD;  Location: [facility] ENDOSCOPY;  Service: Gastroenterology;  Laterality: N/A;   CYSTOSCOPY W/ LITHOLAPAXY / EHL     IMPLANTABLE CARDIOVERTER DEFIBRILLATOR IMPLANT N/A 03/31/2014   Procedure: IMPLANTABLE CARDIOVERTER DEFIBRILLATOR IMPLANT;  Surgeon: Marinus Maw, MD;  Location: [facility] CATH LAB;  Service: Cardiovascular;  Laterality: N/A;   JOINT REPLACEMENT     TONSILLECTOMY     "as a kid"   TOTAL KNEE ARTHROPLASTY Left ~ 2010   TOTAL KNEE ARTHROPLASTY WITH REVISION COMPONENTS Left ~ 2011   VARICOSE VEIN SURGERY Left ~ 2014    Social History   Socioeconomic History   Marital status: Divorced    Spouse name: Not on file   Number of children: Not on file   Years of education: Not on file   Highest education level: Not on file  Occupational History   Occupation: retired  Comment: Chartered certified accountant at [employer]  Tobacco Use   Smoking status: Never   Smokeless tobacco: Never  Vaping Use   Vaping status: Never Used  Substance and Sexual Activity   Alcohol use: No   Drug use: No   Sexual activity: Not Currently  Other Topics Concern   Not on file  Social History Narrative   Not on file   Social Drivers of Health   Financial Resource Strain: Not on file  Food Insecurity: Not on file  Transportation Needs: Not on file  Physical Activity: Not on file  Stress: Not on file  Social Connections: Not on file  Intimate Partner Violence: Not on file    Family History  Problem Relation Age of  Onset   Esophageal cancer Mother        died at 96   Heart attack Father        died at 29   Hypertension Brother    Colon polyps Brother     Allergies  Allergen Reactions   Entresto [Sacubitril-Valsartan]     hallucinations    Outpatient Medications Prior to Visit  Medication Sig   apixaban (ELIQUIS) 5 MG TABS tablet Take 1 tablet (5 mg total) by mouth 2 (two) times daily.   atorvastatin (LIPITOR) 10 MG tablet Take 1 tablet (10 mg total) by mouth daily.   Azelastine HCl 137 MCG/SPRAY SOLN SPARY 1 SPRAY PER NARE DAILY   carvedilol (COREG) 12.5 MG tablet Take 1 tablet (12.5 mg total) by mouth 2 (two) times daily with a meal.   fluticasone-salmeterol (WIXELA INHUB) 250-50 MCG/ACT AEPB TAKE 1 PUFF BY MOUTH TWICE A DAY   furosemide (LASIX) 40 MG tablet TAKE 1 TABLET BY MOUTH TWICE A DAY 2 DAYS A WEEK AND 1 TABLET ONCE A DAY ALL OTHER DAYS.   levothyroxine (SYNTHROID) 75 MCG tablet TAKE 1 TABLET BY MOUTH EVERY DAY BEFORE BREAKFAST   sotalol (BETAPACE) 80 MG tablet Take 1 tablet (80 mg total) by mouth 2 (two) times daily.   spironolactone (ALDACTONE) 25 MG tablet TAKE 1 TABLET BY MOUTH EVERY DAY   triamcinolone (NASACORT) 55 MCG/ACT AERO nasal inhaler Place 1 spray into the nose 2 (two) times daily.   [DISCONTINUED] cetirizine (ZYRTEC) 10 MG tablet Take 1 tablet (10 mg total) by mouth daily. Take 1 tablet (10 mg) by mouth once daily   [DISCONTINUED] donepezil (ARICEPT) 5 MG tablet Take 1 tablet (5 mg total) by mouth at bedtime.   [DISCONTINUED] losartan (COZAAR) 25 MG tablet TAKE 1 TABLET (25 MG TOTAL) BY MOUTH DAILY.   Facility-Administered Medications Prior to Visit  Medication Dose Route Frequency Provider   technetium tetrofosmin (TC-MYOVIEW) injection 23.3 millicurie  23.3 millicurie Intravenous Once PRN Pricilla Riffle, MD    Review of Systems  Constitutional:  Negative for malaise/fatigue and weight loss (Gained 9 lbs).  HENT:  Positive for congestion. Negative for nosebleeds.    Eyes: Negative.   Respiratory: Negative.  Negative for shortness of breath.   Cardiovascular: Negative.  Negative for chest pain, palpitations and orthopnea.  Gastrointestinal: Negative.   Genitourinary: Negative.   Musculoskeletal:  Negative for back pain.  Skin: Negative.   Neurological: Negative.  Negative for dizziness.  Endo/Heme/Allergies: Negative.   Psychiatric/Behavioral:  The patient does not have insomnia.        Objective:   BP 108/62   Pulse 63   Temp (!) 97.1 F (36.2 C)   Ht 5\' 6"  (1.676 m)   Wt 211 lb (  95.7 kg)   SpO2 97%   BMI 34.06 kg/m   Vitals:   05/14/23 0826  BP: 108/62  Pulse: 63  Temp: (!) 97.1 F (36.2 C)  Height: 5\' 6"  (1.676 m)  Weight: 211 lb (95.7 kg)  SpO2: 97%  BMI (Calculated): 34.07    Physical Exam Vitals reviewed.  Constitutional:      General: He is not in acute distress.    Appearance: Normal appearance. He is obese.  HENT:     Head: Normocephalic.     Left Ear: There is no impacted cerumen.     Nose: Nose normal.     Mouth/Throat:     Mouth: Mucous membranes are moist.     Pharynx: No posterior oropharyngeal erythema.  Eyes:     Extraocular Movements: Extraocular movements intact.     Pupils: Pupils are equal, round, and reactive to light.  Cardiovascular:     Rate and Rhythm: Normal rate and regular rhythm.     Chest Wall: PMI is not displaced.     Pulses: Normal pulses.     Heart sounds: Normal heart sounds. No murmur heard. Pulmonary:     Effort: Pulmonary effort is normal.     Breath sounds: Normal air entry. No rhonchi or rales.  Chest:     Comments: Left ICD pocket not erythematous or fluctuant Abdominal:     General: Abdomen is flat. Bowel sounds are normal. There is no distension.     Palpations: Abdomen is soft. There is no hepatomegaly, splenomegaly or mass.     Tenderness: There is no abdominal tenderness.  Musculoskeletal:        General: Normal range of motion.     Cervical back: Normal range of  motion and neck supple.     Right lower leg: No edema.     Left lower leg: No edema.  Skin:    General: Skin is warm and dry.  Neurological:     General: No focal deficit present.     Mental Status: He is alert and oriented to person, place, and time.     Cranial Nerves: No cranial nerve deficit.     Motor: No weakness.  Psychiatric:        Mood and Affect: Mood normal.        Behavior: Behavior normal.      No results found for any visits on 05/14/23.      Assessment & Plan:  As per problem list. Gradually taper off inhaled steroids. Problem List Items Addressed This Visit       Cardiovascular and Mediastinum   HTN (hypertension)   Relevant Medications   losartan (COZAAR) 25 MG tablet     Other   Mixed hyperlipidemia   Relevant Medications   losartan (COZAAR) 25 MG tablet   Other Visit Diagnoses       Moderate dementia without behavioral disturbance, psychotic disturbance, mood disturbance, or anxiety, unspecified dementia type (HCC)    -  Primary   Relevant Medications   donepezil (ARICEPT) 10 MG tablet     Allergic rhinitis due to pollen, unspecified seasonality       Relevant Medications   cetirizine (ZYRTEC) 10 MG tablet     Lumbar pain       Relevant Medications   diclofenac Sodium (VOLTAREN) 1 % GEL       Return in about 2 months (around 07/12/2023) for fu with labs prior.   Total time spent: 20 minutes  Carlene Coria  Ellsworth Lennox, MD  05/14/2023   This document may have been prepared by [facility] Voice Recognition software and as such may include unintentional dictation errors.

## 2023-05-21 DIAGNOSIS — R202 Paresthesia of skin: Secondary | ICD-10-CM | POA: Diagnosis not present

## 2023-05-21 DIAGNOSIS — L304 Erythema intertrigo: Secondary | ICD-10-CM | POA: Diagnosis not present

## 2023-05-21 DIAGNOSIS — L820 Inflamed seborrheic keratosis: Secondary | ICD-10-CM | POA: Diagnosis not present

## 2023-05-21 DIAGNOSIS — D492 Neoplasm of unspecified behavior of bone, soft tissue, and skin: Secondary | ICD-10-CM | POA: Diagnosis not present

## 2023-05-21 DIAGNOSIS — L538 Other specified erythematous conditions: Secondary | ICD-10-CM | POA: Diagnosis not present

## 2023-05-29 ENCOUNTER — Telehealth: Payer: Self-pay

## 2023-05-29 ENCOUNTER — Encounter: Payer: Self-pay | Admitting: Internal Medicine

## 2023-05-29 NOTE — Telephone Encounter (Signed)
 This was sent to [place] via mychart message- please advise  hello, Dr Kinnie Feil changed dad's dosage to 10 mg and he's been having some diarrhea issues. He is scared to leave the house. Is there another option?

## 2023-06-01 ENCOUNTER — Encounter: Payer: Self-pay | Admitting: Internal Medicine

## 2023-06-01 DIAGNOSIS — Z95810 Presence of automatic (implantable) cardiac defibrillator: Secondary | ICD-10-CM

## 2023-06-01 DIAGNOSIS — I428 Other cardiomyopathies: Secondary | ICD-10-CM

## 2023-06-01 DIAGNOSIS — I495 Sick sinus syndrome: Secondary | ICD-10-CM

## 2023-06-01 DIAGNOSIS — I469 Cardiac arrest, cause unspecified: Secondary | ICD-10-CM

## 2023-06-01 DIAGNOSIS — I5022 Chronic systolic (congestive) heart failure: Secondary | ICD-10-CM

## 2023-06-02 ENCOUNTER — Encounter: Payer: Self-pay | Admitting: Internal Medicine

## 2023-06-02 MED ORDER — SPIRONOLACTONE 25 MG PO TABS: ORAL_TABLET | ORAL | 0 refills | Status: DC

## 2023-06-03 ENCOUNTER — Encounter: Payer: Self-pay | Admitting: Internal Medicine

## 2023-06-05 ENCOUNTER — Ambulatory Visit (INDEPENDENT_AMBULATORY_CARE_PROVIDER_SITE_OTHER): Payer: Medicare Other

## 2023-06-05 DIAGNOSIS — I495 Sick sinus syndrome: Secondary | ICD-10-CM | POA: Diagnosis not present

## 2023-06-06 LAB — CUP PACEART REMOTE DEVICE CHECK
Battery Remaining Longevity: 0 mo
Battery Voltage: 2.59 V
Brady Statistic AP VP Percent: 1 %
Brady Statistic AP VS Percent: 98 %
Brady Statistic AS VP Percent: 1 %
Brady Statistic AS VS Percent: 1 %
Brady Statistic RA Percent Paced: 97 %
Brady Statistic RV Percent Paced: 1 %
Date Time Interrogation Session: 20250320020016
HighPow Impedance: 63 Ohm
HighPow Impedance: 63 Ohm
Implantable Lead Connection Status: 753985
Implantable Lead Connection Status: 753985
Implantable Lead Implant Date: 20160114
Implantable Lead Implant Date: 20160114
Implantable Lead Location: 753859
Implantable Lead Location: 753860
Implantable Lead Model: 7122
Implantable Pulse Generator Implant Date: 20160114
Lead Channel Impedance Value: 380 Ohm
Lead Channel Impedance Value: 400 Ohm
Lead Channel Pacing Threshold Amplitude: 0.5 V
Lead Channel Pacing Threshold Amplitude: 1.125 V
Lead Channel Pacing Threshold Pulse Width: 0.5 ms
Lead Channel Pacing Threshold Pulse Width: 0.6 ms
Lead Channel Sensing Intrinsic Amplitude: 10.6 mV
Lead Channel Sensing Intrinsic Amplitude: 2.2 mV
Lead Channel Setting Pacing Amplitude: 1.375
Lead Channel Setting Pacing Amplitude: 1.5 V
Lead Channel Setting Pacing Pulse Width: 0.6 ms
Lead Channel Setting Sensing Sensitivity: 0.5 mV
Pulse Gen Serial Number: 7241695

## 2023-06-09 ENCOUNTER — Other Ambulatory Visit: Payer: Self-pay

## 2023-06-09 ENCOUNTER — Encounter: Payer: Self-pay | Admitting: Internal Medicine

## 2023-06-09 DIAGNOSIS — F03B0 Unspecified dementia, moderate, without behavioral disturbance, psychotic disturbance, mood disturbance, and anxiety: Secondary | ICD-10-CM

## 2023-06-11 ENCOUNTER — Other Ambulatory Visit: Payer: Self-pay

## 2023-06-12 ENCOUNTER — Ambulatory Visit: Payer: Medicare Other | Attending: Internal Medicine | Admitting: Internal Medicine

## 2023-06-12 VITALS — BP 100/60 | HR 60 | Ht 63.0 in | Wt 207.0 lb

## 2023-06-12 DIAGNOSIS — I428 Other cardiomyopathies: Secondary | ICD-10-CM

## 2023-06-12 DIAGNOSIS — I5022 Chronic systolic (congestive) heart failure: Secondary | ICD-10-CM | POA: Diagnosis not present

## 2023-06-12 DIAGNOSIS — I469 Cardiac arrest, cause unspecified: Secondary | ICD-10-CM | POA: Diagnosis not present

## 2023-06-12 DIAGNOSIS — Z95810 Presence of automatic (implantable) cardiac defibrillator: Secondary | ICD-10-CM

## 2023-06-12 DIAGNOSIS — I495 Sick sinus syndrome: Secondary | ICD-10-CM

## 2023-06-12 LAB — CUP PACEART INCLINIC DEVICE CHECK
Battery Remaining Longevity: 0 mo
Brady Statistic RA Percent Paced: 97 %
Brady Statistic RV Percent Paced: 0.42 %
Date Time Interrogation Session: 20250327142252
HighPow Impedance: 73.125
Implantable Lead Connection Status: 753985
Implantable Lead Connection Status: 753985
Implantable Lead Implant Date: 20160114
Implantable Lead Implant Date: 20160114
Implantable Lead Location: 753859
Implantable Lead Location: 753860
Implantable Lead Model: 7122
Implantable Pulse Generator Implant Date: 20160114
Lead Channel Impedance Value: 412.5 Ohm
Lead Channel Impedance Value: 425 Ohm
Lead Channel Pacing Threshold Amplitude: 0.5 V
Lead Channel Pacing Threshold Amplitude: 0.5 V
Lead Channel Pacing Threshold Amplitude: 1.5 V
Lead Channel Pacing Threshold Amplitude: 1.5 V
Lead Channel Pacing Threshold Pulse Width: 0.5 ms
Lead Channel Pacing Threshold Pulse Width: 0.5 ms
Lead Channel Pacing Threshold Pulse Width: 0.6 ms
Lead Channel Pacing Threshold Pulse Width: 0.6 ms
Lead Channel Sensing Intrinsic Amplitude: 3.6 mV
Lead Channel Sensing Intrinsic Amplitude: 9.6 mV
Lead Channel Setting Pacing Amplitude: 1.25 V
Lead Channel Setting Pacing Amplitude: 1.5 V
Lead Channel Setting Pacing Pulse Width: 0.6 ms
Lead Channel Setting Sensing Sensitivity: 0.5 mV
Pulse Gen Serial Number: 7241695

## 2023-06-12 LAB — PACEMAKER DEVICE OBSERVATION

## 2023-06-12 NOTE — Patient Instructions (Addendum)
 Medication Instructions:  The current medical regimen is effective;  continue present plan and medications as directed. Please refer to the Current Medication list given to you today.   *If you need a refill on your cardiac medications before your next appointment, please call your pharmacy*   Lab Work: Your provider would like for you to have following labs drawn today CBC, BMET.   If you have labs (blood work) drawn today and your tests are completely normal, you will receive your results only by: MyChart Message (if you have MyChart) OR A paper copy in the mail If you have any lab test that is abnormal or we need to change your treatment, we will call you to review the results.   Testing/Procedures: DEVICE GEN CHANGE- please follow letter given today at visit.    Follow-Up: At [facility], you and your health needs are our priority.  As part of our continuing mission to provide you with exceptional heart care, we have created designated Provider Care Teams.  These Care Teams include your primary Cardiologist (physician) and Advanced Practice Providers (APPs -  Physician Assistants and Nurse Practitioners) who all work together to provide you with the care you need, when you need it.  We recommend signing up for the patient portal called "MyChart".  Sign up information is provided on this After Visit Summary.  MyChart is used to connect with patients for Virtual Visits (Telemedicine).  Patients are able to view lab/test results, encounter notes, upcoming appointments, etc.  Non-urgent messages can be sent to your provider as well.   To learn more about what you can do with MyChart, go to [email or web address].    Your next appointment:   They will call you for your follow up appointments.

## 2023-06-12 NOTE — Progress Notes (Signed)
 Patient Care Team: Ethan Monday, MD as PCP - General (Internal Medicine) Duke Salvia, MD as PCP - Cardiology (Cardiology)   HPI  Ethan Vega. is a 78 y.o. 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    On sotalol.   Atrial fibrillation-persistent, anticoagulation w Apixoban no bleeding.  Eval for Cardiac Arrest Flecainide challenge was negative ECG  Low Volts R-V1 with RAD with mild RA enlargement SAECG not done as he was in AFib RVR and rate related RBBB Abnormal thallium scan suggesting sarcoid   The patient denies chest pain.  There have been no palpitations, lightheadedness or syncope.  Complains of SOB at rest and weith exertion and in this regard is notable that he is disinclined and does not take diuretics because he cannot get to the bathroom He Has Been Disinclined Towards Diapers.  He has edema that is weeping Sleeps in a chair .  Continues to have increasingly frequent falls  Progressive concerns re his memory   Ethan Vega, age 23, HTN-1 DM -1 for CHADSVAS score >= 6                Date Cr K Hgb  Mg  3/21 1.14 4.1 13.4 2.0  9/21 1.00 4.2 13.1 2.3  5/22 1.08 4.4 13.3 (3/22) 2.2 (3/22)   11/22 1.2 4.2 13.7   8/23 1.43 4.5 12.8 2.4  7/24 1.20 4.9 12.6 2.1 (4/24)   10/24 4.29 5.2 12.6        DATE TEST EF   1/16 Cath  30-35%  CA normal   4/16  Echo   45-50 %   5/16 myoview    prior infarct?? No ischemia  2/18 ECHO  40-45 %   7/20 TPP scan  Unlikely TTR amyloid  7/20 Echo  35-40% PA press est 35          Antiarrhythmics Date Reason stopped  amiodarone  /  Optic neuritis  Sotalol  4/20         Past Medical History:  Diagnosis Date   Actinic keratosis    AICD (automatic cardioverter/defibrillator) present  03/31/2014   a. 03/2014 s/p SJM 2411-36C dual chamber AICD (serial Number [id number])- followed by Dr. Graciela Husbands   Arthritis    a. L knee.   Asthma    Basal cell carcinoma 08/29/2008   Vertex scalp. Keratotic pattern, ulcerated.   Basal cell carcinoma 01/15/2018   Left distal medial thigh near knee. Fibroepithelioma of pinkus type   Basal cell carcinoma 01/15/2018   Right distal lat. nose ant. inferior edge. Nodular pattern.   Cardiac arrest (HCC)    a. 03/30/2014 VF arrest in setting of NICM >> CPR/defib in community >> s/p dual chamber AICD   Coronary artery disease, non-occlusive    a. cath 03/2014 at [facility]: no sig CAD (OM1 30%, CFX 30%), EF 35%   Dysplastic nevus 01/31/2020   L mid back paraspinal - severe, excision 03/21/2020   HLD (hyperlipidemia)    HTN (hypertension)    Kidney stones    Melanoma (HCC)    Melanoma resected from scalp   NICM (nonischemic cardiomyopathy) (HCC)    a. 03/2014 Echo:  Inferolateral and lateral HK, EF 50-55%, grade 1 diastolic dysfunction, mild MR, mild LAE, mild RAE, trivial TR, no  effusion; 09/2018 Echo: EF 35-40%, impaired relaxation, glob HK, sev apical HK. Nl RV fxn. RVSP . Mildly dil LA.   Paroxysmal atrial fibrillation (HCC) 03/28/2015   Retinal artery occlusion     Past Surgical History:  Procedure Laterality Date   adenomatous polyps     CARDIAC CATHETERIZATION  03/30/2014   CARDIOVERSION  03/30/2014   COLONOSCOPY WITH PROPOFOL N/A 03/24/2017   Procedure: COLONOSCOPY WITH PROPOFOL;  Surgeon: Christena Deem, MD;  Location: [facility] ENDOSCOPY;  Service: Endoscopy;  Laterality: N/A;   COLONOSCOPY WITH PROPOFOL N/A 01/07/2019   Procedure: COLONOSCOPY WITH PROPOFOL;  Surgeon: Toledo, Boykin Nearing, MD;  Location: [facility] ENDOSCOPY;  Service: Gastroenterology;  Laterality: N/A;   CYSTOSCOPY W/ LITHOLAPAXY / EHL     IMPLANTABLE CARDIOVERTER DEFIBRILLATOR IMPLANT N/A 03/31/2014   Procedure: IMPLANTABLE CARDIOVERTER DEFIBRILLATOR IMPLANT;  Surgeon: Marinus Maw, MD;  Location: [facility] CATH LAB;  Service: Cardiovascular;  Laterality: N/A;   JOINT REPLACEMENT     TONSILLECTOMY     "as a kid"   TOTAL KNEE ARTHROPLASTY Left ~ 2010   TOTAL KNEE ARTHROPLASTY WITH REVISION COMPONENTS Left ~ 2011   VARICOSE VEIN SURGERY Left ~ 2014    Current Outpatient Medications  Medication Sig Dispense Refill   apixaban (ELIQUIS) 5 MG TABS tablet Take 1 tablet (5 mg total) by mouth 2 (two) times daily. 180 tablet 3   atorvastatin (LIPITOR) 10 MG tablet Take 1 tablet (10 mg total) by mouth daily. 90 tablet 3   Azelastine HCl 137 MCG/SPRAY SOLN SPARY 1 SPRAY PER NARE DAILY 30 mL 1   carvedilol (COREG) 12.5 MG tablet Take 1 tablet (12.5 mg total) by mouth 2 (two) times daily with a meal. 180 tablet 3   cetirizine (ZYRTEC) 10 MG tablet Take 1 tablet (10 mg total) by mouth daily. Take 1 tablet (10 mg) by mouth once daily 30 tablet 2   diclofenac Sodium (VOLTAREN) 1 % GEL Apply 4 g topically in the morning and at bedtime. 240 g 3   donepezil (ARICEPT) 10 MG tablet Take 1 tablet (10 mg total) by mouth at bedtime. 30 tablet 1   fluticasone-salmeterol (WIXELA INHUB) 250-50 MCG/ACT AEPB TAKE 1 PUFF BY MOUTH TWICE A DAY 60 each 5   furosemide (LASIX) 40 MG tablet TAKE 1 TABLET BY MOUTH TWICE A DAY 2 DAYS A WEEK AND 1 TABLET ONCE A DAY ALL OTHER DAYS. 108 tablet 3   levothyroxine (SYNTHROID) 75 MCG tablet TAKE 1 TABLET BY MOUTH EVERY DAY BEFORE BREAKFAST 90 tablet 0   losartan (COZAAR) 25 MG tablet Take 1 tablet (25 mg total) by mouth daily. 90 tablet 2   nystatin-triamcinolone ointment (MYCOLOG) Apply topically as needed.     sotalol (BETAPACE) 80 MG tablet Take 1 tablet (80 mg total) by mouth 2 (two) times daily. 180 tablet 3   spironolactone (ALDACTONE) 25 MG tablet TAKE 1 TABLET BY MOUTH EVERY DAY 90 tablet 0   triamcinolone (NASACORT) 55 MCG/ACT AERO nasal inhaler Place 1 spray into the nose 2 (two) times daily. 60 each 2   No current facility-administered medications for  this visit.   Facility-Administered Medications Ordered in Other Visits  Medication Dose Route Frequency Provider Last Rate Last Admin   technetium tetrofosmin (TC-MYOVIEW) injection 23.3 millicurie  23.3 millicurie Intravenous Once PRN Pricilla Riffle, MD        Allergies  Allergen Reactions   Sherryll Burger [Sacubitril-Valsartan]     hallucinations    Review of Systems negative except  from HPI and PMH  Physical Exam  BP 100/60 (BP Location: Right Arm)   Pulse 60   Ht 5\' 3"  (1.6 m)   Wt 207 lb (93.9 kg)   SpO2 97%   BMI 36.67 kg/m  Well developed and well nourished in no acute distress HENT normal Neck supple   Clear Device pocket well healed; without hematoma or erythema.  There is no tethering  Regular rate and rhythm, no  gallop No murmur Abd-soft with active BS No Clubbing cyanosis 3+ weeping edema Skin-warm and dry A & Oriented  Grossly normal sensory and motor function  ECG A pacing with intrinsic conduction 22/08/44 Upright QRS  V1 and neg QRS lead 1 ( finding progressive x years)   Device function is normal but BATTERY AT ERI  Programming changes none  See Paceart for details      Assessment and  Plan  Aborted Cardiac arrest//FHx Cardiac Arrest   Nonischemic cardiomyopathy  ICD dual chamber  St Jude       Hypertension now hypotension  Ventricular tachycardia sustained and non sustained   High Risk Medication Surveillance-sotalol  Sinus bradycardia/chronotropic incompetence  Low Voltage ECG  >>PYP equivocal   iRBBB LAFB  Atrial Fibrillation persistent   Congestive heart failure-chronic systolic/diastolic  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  Blood pressure is low today but blood pressures recorded at home have been in the 1 30-1 40 range we will decrease his carvedilol from 12.5--6.25 and have him take his Aldactone at night  Strongly encouraged him to resume the use of his diuretics  and if need be to get Depends both day and night Noted the association of weeping edema potential for cellulitis and then secondary device infections.  Once the discussion regarding end-of-life decision-making.  With his progressive dementia needs to have conversations at this juncture with his daughter regarding how he would like decisions to be made but for now we need to decide whether to replace his high-voltage therapies or not know we have a decision regarding device generator replacement.  We discussed also replacing with high-voltage at some future date inactivating high-voltage therapies.  This is important not withstanding the fact that this is a secondary prevention device as we are now dealing with competitive forms of end-of-life both with dementia as well as progressive heart failure  Repeat echocardiogram to assess LV function  We will plan to schedule this in 4-8 weeks to give them some time to decide what it is that they would like to do.

## 2023-06-12 NOTE — H&P (View-Only) (Signed)
 Patient Care Team: Sherron Monday, MD as PCP - General (Internal Medicine) Duke Salvia, MD as PCP - Cardiology (Cardiology)   HPI  Ethan Shown Quantay Zaremba. is a 78 y.o. 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    On sotalol.   Atrial fibrillation-persistent, anticoagulation w Apixoban no bleeding.  Eval for Cardiac Arrest Flecainide challenge was negative ECG  Low Volts R-V1 with RAD with mild RA enlargement SAECG not done as he was in AFib RVR and rate related RBBB Abnormal thallium scan suggesting sarcoid   The patient denies chest pain.  There have been no palpitations, lightheadedness or syncope.  Complains of SOB at rest and weith exertion and in this regard is notable that he is disinclined and does not take diuretics because he cannot get to the bathroom He Has Been Disinclined Towards Diapers.  He has edema that is weeping Sleeps in a chair .  Continues to have increasingly frequent falls  Progressive concerns re his memory   Ethan Vega, age 23, HTN-1 DM -1 for CHADSVAS score >= 6                Date Cr K Hgb  Mg  3/21 1.14 4.1 13.4 2.0  9/21 1.00 4.2 13.1 2.3  5/22 1.08 4.4 13.3 (3/22) 2.2 (3/22)   11/22 1.2 4.2 13.7   8/23 1.43 4.5 12.8 2.4  7/24 1.20 4.9 12.6 2.1 (4/24)   10/24 4.29 5.2 12.6        DATE TEST EF   1/16 Cath  30-35%  CA normal   4/16  Echo   45-50 %   5/16 myoview    prior infarct?? No ischemia  2/18 ECHO  40-45 %   7/20 TPP scan  Unlikely TTR amyloid  7/20 Echo  35-40% PA press est 35          Antiarrhythmics Date Reason stopped  amiodarone  /  Optic neuritis  Sotalol  4/20         Past Medical History:  Diagnosis Date   Actinic keratosis    AICD (automatic cardioverter/defibrillator) present  03/31/2014   a. 03/2014 s/p SJM 2411-36C dual chamber AICD (serial Number [id number])- followed by Dr. Graciela Husbands   Arthritis    a. L knee.   Asthma    Basal cell carcinoma 08/29/2008   Vertex scalp. Keratotic pattern, ulcerated.   Basal cell carcinoma 01/15/2018   Left distal medial thigh near knee. Fibroepithelioma of pinkus type   Basal cell carcinoma 01/15/2018   Right distal lat. nose ant. inferior edge. Nodular pattern.   Cardiac arrest (HCC)    a. 03/30/2014 VF arrest in setting of NICM >> CPR/defib in community >> s/p dual chamber AICD   Coronary artery disease, non-occlusive    a. cath 03/2014 at [facility]: no sig CAD (OM1 30%, CFX 30%), EF 35%   Dysplastic nevus 01/31/2020   L mid back paraspinal - severe, excision 03/21/2020   HLD (hyperlipidemia)    HTN (hypertension)    Kidney stones    Melanoma (HCC)    Melanoma resected from scalp   NICM (nonischemic cardiomyopathy) (HCC)    a. 03/2014 Echo:  Inferolateral and lateral HK, EF 50-55%, grade 1 diastolic dysfunction, mild MR, mild LAE, mild RAE, trivial TR, no  effusion; 09/2018 Echo: EF 35-40%, impaired relaxation, glob HK, sev apical HK. Nl RV fxn. RVSP . Mildly dil LA.   Paroxysmal atrial fibrillation (HCC) 03/28/2015   Retinal artery occlusion     Past Surgical History:  Procedure Laterality Date   adenomatous polyps     CARDIAC CATHETERIZATION  03/30/2014   CARDIOVERSION  03/30/2014   COLONOSCOPY WITH PROPOFOL N/A 03/24/2017   Procedure: COLONOSCOPY WITH PROPOFOL;  Surgeon: Christena Deem, MD;  Location: [facility] ENDOSCOPY;  Service: Endoscopy;  Laterality: N/A;   COLONOSCOPY WITH PROPOFOL N/A 01/07/2019   Procedure: COLONOSCOPY WITH PROPOFOL;  Surgeon: Toledo, Boykin Nearing, MD;  Location: [facility] ENDOSCOPY;  Service: Gastroenterology;  Laterality: N/A;   CYSTOSCOPY W/ LITHOLAPAXY / EHL     IMPLANTABLE CARDIOVERTER DEFIBRILLATOR IMPLANT N/A 03/31/2014   Procedure: IMPLANTABLE CARDIOVERTER DEFIBRILLATOR IMPLANT;  Surgeon: Marinus Maw, MD;  Location: [facility] CATH LAB;  Service: Cardiovascular;  Laterality: N/A;   JOINT REPLACEMENT     TONSILLECTOMY     "as a kid"   TOTAL KNEE ARTHROPLASTY Left ~ 2010   TOTAL KNEE ARTHROPLASTY WITH REVISION COMPONENTS Left ~ 2011   VARICOSE VEIN SURGERY Left ~ 2014    Current Outpatient Medications  Medication Sig Dispense Refill   apixaban (ELIQUIS) 5 MG TABS tablet Take 1 tablet (5 mg total) by mouth 2 (two) times daily. 180 tablet 3   atorvastatin (LIPITOR) 10 MG tablet Take 1 tablet (10 mg total) by mouth daily. 90 tablet 3   Azelastine HCl 137 MCG/SPRAY SOLN SPARY 1 SPRAY PER NARE DAILY 30 mL 1   carvedilol (COREG) 12.5 MG tablet Take 1 tablet (12.5 mg total) by mouth 2 (two) times daily with a meal. 180 tablet 3   cetirizine (ZYRTEC) 10 MG tablet Take 1 tablet (10 mg total) by mouth daily. Take 1 tablet (10 mg) by mouth once daily 30 tablet 2   diclofenac Sodium (VOLTAREN) 1 % GEL Apply 4 g topically in the morning and at bedtime. 240 g 3   donepezil (ARICEPT) 10 MG tablet Take 1 tablet (10 mg total) by mouth at bedtime. 30 tablet 1   fluticasone-salmeterol (WIXELA INHUB) 250-50 MCG/ACT AEPB TAKE 1 PUFF BY MOUTH TWICE A DAY 60 each 5   furosemide (LASIX) 40 MG tablet TAKE 1 TABLET BY MOUTH TWICE A DAY 2 DAYS A WEEK AND 1 TABLET ONCE A DAY ALL OTHER DAYS. 108 tablet 3   levothyroxine (SYNTHROID) 75 MCG tablet TAKE 1 TABLET BY MOUTH EVERY DAY BEFORE BREAKFAST 90 tablet 0   losartan (COZAAR) 25 MG tablet Take 1 tablet (25 mg total) by mouth daily. 90 tablet 2   nystatin-triamcinolone ointment (MYCOLOG) Apply topically as needed.     sotalol (BETAPACE) 80 MG tablet Take 1 tablet (80 mg total) by mouth 2 (two) times daily. 180 tablet 3   spironolactone (ALDACTONE) 25 MG tablet TAKE 1 TABLET BY MOUTH EVERY DAY 90 tablet 0   triamcinolone (NASACORT) 55 MCG/ACT AERO nasal inhaler Place 1 spray into the nose 2 (two) times daily. 60 each 2   No current facility-administered medications for  this visit.   Facility-Administered Medications Ordered in Other Visits  Medication Dose Route Frequency Provider Last Rate Last Admin   technetium tetrofosmin (TC-MYOVIEW) injection 23.3 millicurie  23.3 millicurie Intravenous Once PRN Pricilla Riffle, MD        Allergies  Allergen Reactions   Sherryll Burger [Sacubitril-Valsartan]     hallucinations    Review of Systems negative except  from HPI and PMH  Physical Exam  BP 100/60 (BP Location: Right Arm)   Pulse 60   Ht 5\' 3"  (1.6 m)   Wt 207 lb (93.9 kg)   SpO2 97%   BMI 36.67 kg/m  Well developed and well nourished in no acute distress HENT normal Neck supple   Clear Device pocket well healed; without hematoma or erythema.  There is no tethering  Regular rate and rhythm, no  gallop No murmur Abd-soft with active BS No Clubbing cyanosis 3+ weeping edema Skin-warm and dry A & Oriented  Grossly normal sensory and motor function  ECG A pacing with intrinsic conduction 22/08/44 Upright QRS  V1 and neg QRS lead 1 ( finding progressive x years)   Device function is normal but BATTERY AT ERI  Programming changes none  See Paceart for details      Assessment and  Plan  Aborted Cardiac arrest//FHx Cardiac Arrest   Nonischemic cardiomyopathy  ICD dual chamber  St Jude       Hypertension now hypotension  Ventricular tachycardia sustained and non sustained   High Risk Medication Surveillance-sotalol  Sinus bradycardia/chronotropic incompetence  Low Voltage ECG  >>PYP equivocal   iRBBB LAFB  Atrial Fibrillation persistent   Congestive heart failure-chronic systolic/diastolic  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  Blood pressure is low today but blood pressures recorded at home have been in the 1 30-1 40 range we will decrease his carvedilol from 12.5--6.25 and have him take his Aldactone at night  Strongly encouraged him to resume the use of his diuretics  and if need be to get Depends both day and night Noted the association of weeping edema potential for cellulitis and then secondary device infections.  Once the discussion regarding end-of-life decision-making.  With his progressive dementia needs to have conversations at this juncture with his daughter regarding how he would like decisions to be made but for now we need to decide whether to replace his high-voltage therapies or not know we have a decision regarding device generator replacement.  We discussed also replacing with high-voltage at some future date inactivating high-voltage therapies.  This is important not withstanding the fact that this is a secondary prevention device as we are now dealing with competitive forms of end-of-life both with dementia as well as progressive heart failure  Repeat echocardiogram to assess LV function  We will plan to schedule this in 4-8 weeks to give them some time to decide what it is that they would like to do.

## 2023-06-13 ENCOUNTER — Telehealth: Payer: Self-pay | Admitting: Internal Medicine

## 2023-06-13 MED ORDER — FUROSEMIDE 40 MG PO TABS: ORAL_TABLET | ORAL | 3 refills | Status: DC

## 2023-06-13 MED ORDER — APIXABAN 5 MG PO TABS: 5.0000 mg | ORAL_TABLET | Freq: Two times a day (BID) | ORAL | 0 refills | Status: DC

## 2023-06-13 NOTE — Telephone Encounter (Signed)
 Spoke to pt's daughter who reported pt was seen by Dr. Graciela Husbands yesterday  06/12/23 and instructed to resume his previous lasix doser. However, pt's daughter noted pts current bottle is expired and requested an updated prescription.  Refills sent to requested pharmacy

## 2023-06-13 NOTE — Progress Notes (Signed)
 Remote ICD transmission.

## 2023-06-13 NOTE — Addendum Note (Signed)
 Addended by: Geralyn Flash D on: 06/13/2023 01:44 PM   Modules accepted: Orders

## 2023-06-13 NOTE — Telephone Encounter (Signed)
  Pt c/o medication issue:  1. Name of Medication: furosemide (LASIX) 40 MG tablet   2. How are you currently taking this medication (dosage and times per day)?   TAKE 1 TABLET BY MOUTH TWICE A DAY 2 DAYS A WEEK AND 1 TABLET ONCE A DAY ALL OTHER DAYS.    3. Are you having a reaction (difficulty breathing--STAT)? No   4. What is your medication issue? Pt's daughter following up the pt's lasix. She said, pt's legs is really swollen and if they can get the prescription before the weekend

## 2023-06-16 ENCOUNTER — Other Ambulatory Visit: Payer: Self-pay

## 2023-06-16 MED ORDER — FUROSEMIDE 40 MG PO TABS: ORAL_TABLET | ORAL | 3 refills | Status: DC

## 2023-06-16 MED ORDER — CARVEDILOL 6.25 MG PO TABS: 6.2500 mg | ORAL_TABLET | Freq: Two times a day (BID) | ORAL | 2 refills | Status: DC

## 2023-06-16 NOTE — Addendum Note (Signed)
 Addended by: Darene Lamer T on: 06/16/2023 11:11 AM   Modules accepted: Orders

## 2023-06-18 NOTE — Telephone Encounter (Signed)
 ICD Gen Change scheduled with Dr. Graciela Husbands on 4/24 at 730 at [facility]

## 2023-06-19 ENCOUNTER — Encounter: Payer: Self-pay | Admitting: Internal Medicine

## 2023-06-20 ENCOUNTER — Encounter: Payer: Self-pay | Admitting: Internal Medicine

## 2023-06-23 NOTE — Telephone Encounter (Signed)
 CoreVue reflects fluid levels are dr although a sharpe change has occurred. Contacted [employer] rep [place] who advises patient should be treated for symptoms and possibly there is scar tissue at the lead impedance sensor. Judie Grieve will follow up with calling into tech services.  Routing to Dr. Klein/covering RN to advise further.

## 2023-06-25 ENCOUNTER — Encounter: Admitting: Cardiology

## 2023-06-26 ENCOUNTER — Encounter: Payer: Self-pay | Admitting: Internal Medicine

## 2023-06-26 ENCOUNTER — Ambulatory Visit: Attending: Internal Medicine | Admitting: Internal Medicine

## 2023-06-26 VITALS — BP 91/61 | HR 64 | Ht 63.0 in | Wt 198.0 lb

## 2023-06-26 DIAGNOSIS — R197 Diarrhea, unspecified: Secondary | ICD-10-CM | POA: Diagnosis not present

## 2023-06-26 DIAGNOSIS — I469 Cardiac arrest, cause unspecified: Secondary | ICD-10-CM | POA: Diagnosis not present

## 2023-06-26 DIAGNOSIS — I472 Ventricular tachycardia, unspecified: Secondary | ICD-10-CM | POA: Diagnosis not present

## 2023-06-26 DIAGNOSIS — E039 Hypothyroidism, unspecified: Secondary | ICD-10-CM

## 2023-06-26 DIAGNOSIS — R112 Nausea with vomiting, unspecified: Secondary | ICD-10-CM

## 2023-06-26 DIAGNOSIS — Z79899 Other long term (current) drug therapy: Secondary | ICD-10-CM

## 2023-06-26 DIAGNOSIS — Z95810 Presence of automatic (implantable) cardiac defibrillator: Secondary | ICD-10-CM

## 2023-06-26 DIAGNOSIS — I495 Sick sinus syndrome: Secondary | ICD-10-CM

## 2023-06-26 DIAGNOSIS — I428 Other cardiomyopathies: Secondary | ICD-10-CM | POA: Diagnosis not present

## 2023-06-26 DIAGNOSIS — I5022 Chronic systolic (congestive) heart failure: Secondary | ICD-10-CM

## 2023-06-26 MED ORDER — SPIRONOLACTONE 25 MG PO TABS: 12.5000 mg | ORAL_TABLET | Freq: Every day | ORAL | Status: DC

## 2023-06-26 MED ORDER — SOTALOL HCL 80 MG PO TABS: 80.0000 mg | ORAL_TABLET | Freq: Two times a day (BID) | ORAL | 1 refills | Status: DC

## 2023-06-26 NOTE — H&P (View-Only) (Signed)
 Patient Care Team: Ethan Monday, MD as PCP - General (Internal Medicine) Ethan Salvia, MD as PCP - Cardiology (Cardiology)   HPI  Ethan Vega. is a 78 y.o. 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    On sotalol.   Atrial fibrillation-persistent, anticoagulation w Apixoban no bleeding.  Eval for Cardiac Arrest Flecainide challenge was negative ECG  Low Volts R-V1 with RAD with mild RA enlargement SAECG not done as he was in AFib RVR and rate related RBBB Abnormal thallium scan suggesting sarcoid   No chest pain or palpitations.  Some lightheadedness and falls.  Continues to be short of breath.  Going to the bathroom frequently on the diuretics.  Sometimes leaks.  Also having problems with diarrhea and soiling his pants.  Increasingly anxious about eating because of postprandial nausea and vomiting.  Losing weight..     Progressive concerns re his memory   Ethan Vega, age 55, HTN-1 DM -1 for CHADSVAS score >= 6                Date Cr K Hgb  Mg TSH  3/21 1.14 4.1 13.4 2.0   9/21 1.00 4.2 13.1 2.3   5/22 1.08 4.4 13.3 (3/22) 2.2 (3/22)    11/22 1.2 4.2 13.7    8/23 1.43 4.5 12.8 2.4   7/24 1.20 4.9 12.6 2.1 (4/24)  0.059  10/24 1.29 5.2 12.6                DATE TEST EF   1/16 Cath  30-35%  CA normal   4/16  Echo   45-50 %   5/16 myoview    prior infarct?? No ischemia  2/18 ECHO  40-45 %   7/20 TPP scan  Unlikely TTR amyloid  7/20 Echo  35-40% PA press est 35          Antiarrhythmics Date Reason stopped  amiodarone  /  Optic neuritis  Sotalol  4/20         Past Medical History:  Diagnosis Date   Actinic keratosis    AICD (automatic cardioverter/defibrillator) present 03/31/2014   a. 03/2014 s/p SJM 2411-36C dual chamber  AICD (serial Number [id number])- followed by Dr. Graciela Husbands   Arthritis    a. L knee.   Asthma    Basal cell carcinoma 08/29/2008   Vertex scalp. Keratotic pattern, ulcerated.   Basal cell carcinoma 01/15/2018   Left distal medial thigh near knee. Fibroepithelioma of pinkus type   Basal cell carcinoma 01/15/2018   Right distal lat. nose ant. inferior edge. Nodular pattern.   Cardiac arrest (HCC)    a. 03/30/2014 VF arrest in setting of NICM >> CPR/defib in community >> s/p dual chamber AICD   Coronary artery disease, non-occlusive    a. cath 03/2014 at [facility]: no sig CAD (OM1 30%, CFX 30%), EF 35%   Dysplastic nevus 01/31/2020   L mid back paraspinal - severe, excision 03/21/2020   HLD (hyperlipidemia)    HTN (hypertension)    Kidney stones    Melanoma (HCC)    Melanoma resected from scalp   NICM (nonischemic cardiomyopathy) (HCC)    a. 03/2014 Echo:  Inferolateral and lateral HK, EF 50-55%, grade 1 diastolic dysfunction, mild MR, mild LAE, mild RAE, trivial TR,  no effusion; 09/2018 Echo: EF 35-40%, impaired relaxation, glob HK, sev apical HK. Nl RV fxn. RVSP . Mildly dil LA.   Paroxysmal atrial fibrillation (HCC) 03/28/2015   Retinal artery occlusion     Past Surgical History:  Procedure Laterality Date   adenomatous polyps     CARDIAC CATHETERIZATION  03/30/2014   CARDIOVERSION  03/30/2014   COLONOSCOPY WITH PROPOFOL N/A 03/24/2017   Procedure: COLONOSCOPY WITH PROPOFOL;  Surgeon: Christena Deem, MD;  Location: [facility] ENDOSCOPY;  Service: Endoscopy;  Laterality: N/A;   COLONOSCOPY WITH PROPOFOL N/A 01/07/2019   Procedure: COLONOSCOPY WITH PROPOFOL;  Surgeon: Toledo, Boykin Nearing, MD;  Location: [facility] ENDOSCOPY;  Service: Gastroenterology;  Laterality: N/A;   CYSTOSCOPY W/ LITHOLAPAXY / EHL     IMPLANTABLE CARDIOVERTER DEFIBRILLATOR IMPLANT N/A 03/31/2014   Procedure: IMPLANTABLE CARDIOVERTER DEFIBRILLATOR IMPLANT;  Surgeon: Marinus Maw, MD;  Location: [facility] CATH LAB;  Service:  Cardiovascular;  Laterality: N/A;   JOINT REPLACEMENT     TONSILLECTOMY     "as a kid"   TOTAL KNEE ARTHROPLASTY Left ~ 2010   TOTAL KNEE ARTHROPLASTY WITH REVISION COMPONENTS Left ~ 2011   VARICOSE VEIN SURGERY Left ~ 2014    Current Outpatient Medications  Medication Sig Dispense Refill   apixaban (ELIQUIS) 5 MG TABS tablet Take 1 tablet (5 mg total) by mouth 2 (two) times daily. 180 tablet 3   apixaban (ELIQUIS) 5 MG TABS tablet Take 1 tablet (5 mg total) by mouth 2 (two) times daily. 42 tablet 0   atorvastatin (LIPITOR) 10 MG tablet Take 1 tablet (10 mg total) by mouth daily. 90 tablet 3   Azelastine HCl 137 MCG/SPRAY SOLN SPARY 1 SPRAY PER NARE DAILY 30 mL 1   carvedilol (COREG) 6.25 MG tablet Take 1 tablet (6.25 mg total) by mouth 2 (two) times daily with a meal. 180 tablet 2   cetirizine (ZYRTEC) 10 MG tablet Take 1 tablet (10 mg total) by mouth daily. Take 1 tablet (10 mg) by mouth once daily 30 tablet 2   diclofenac Sodium (VOLTAREN) 1 % GEL Apply 4 g topically in the morning and at bedtime. 240 g 3   donepezil (ARICEPT) 10 MG tablet Take 1 tablet (10 mg total) by mouth at bedtime. 30 tablet 1   fluticasone-salmeterol (WIXELA INHUB) 250-50 MCG/ACT AEPB TAKE 1 PUFF BY MOUTH TWICE A DAY 60 each 5   furosemide (LASIX) 40 MG tablet TAKE 1 TABLET BY MOUTH TWICE A DAY 2 DAYS A WEEK AND 1 TABLET ONCE A DAY ALL OTHER DAYS 108 tablet 3   levothyroxine (SYNTHROID) 75 MCG tablet TAKE 1 TABLET BY MOUTH EVERY DAY BEFORE BREAKFAST 90 tablet 0   losartan (COZAAR) 25 MG tablet Take 1 tablet (25 mg total) by mouth daily. 90 tablet 2   nystatin-triamcinolone ointment (MYCOLOG) Apply topically as needed.     sotalol (BETAPACE) 80 MG tablet Take 1 tablet (80 mg total) by mouth 2 (two) times daily. 180 tablet 3   spironolactone (ALDACTONE) 25 MG tablet TAKE 1 TABLET BY MOUTH EVERY DAY 90 tablet 0   triamcinolone (NASACORT) 55 MCG/ACT AERO nasal inhaler Place 1 spray into the nose 2 (two) times daily.  60 each 2   No current facility-administered medications for this visit.   Facility-Administered Medications Ordered in Other Visits  Medication Dose Route Frequency Provider Last Rate Last Admin   technetium tetrofosmin (TC-MYOVIEW) injection 23.3 millicurie  23.3 millicurie Intravenous Once PRN Pricilla Riffle, MD  Allergies  Allergen Reactions   Entresto [Sacubitril-Valsartan]     hallucinations    Review of Systems negative except from HPI and PMH  Physical Exam  BP 91/61   Pulse 64   Ht 5\' 3"  (1.6 m)   Wt 198 lb (89.8 kg)   SpO2 95%   BMI 35.07 kg/m  Well developed and well nourished in no acute distress HENT normal Neck supple with JVP-flat Clear Device pocket well healed; without hematoma or erythema.  There is no tethering  Regular rate and rhythm, no gallop 2/6 murmur Abd-soft with active BS No Clubbing cyanosis 2+ edema Skin-warm and dry A & Oriented  Grossly normal sensory and motor function  ECG sinus with atrial pacing  Device function is abnormal. 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  We will discontinue the carvedilol and cut his spironolactone from 25--12.5.  His biggest complaint currently is his GI issue with now not needing because of fear of throwing up and nausea as  well as having problems with diarrhea and soiling.  We will refer him to [facility] GI.  We will need to decide about high-voltage versus not high-voltage replacement therapy at the time of generator replacement scheduled for the end of April

## 2023-06-26 NOTE — Progress Notes (Signed)
 Patient Care Team: Sherron Monday, MD as PCP - General (Internal Medicine) Duke Salvia, MD as PCP - Cardiology (Cardiology)   HPI  Ethan Vega. is a 78 y.o. 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    On sotalol.   Atrial fibrillation-persistent, anticoagulation w Apixoban no bleeding.  Eval for Cardiac Arrest Flecainide challenge was negative ECG  Low Volts R-V1 with RAD with mild RA enlargement SAECG not done as he was in AFib RVR and rate related RBBB Abnormal thallium scan suggesting sarcoid   No chest pain or palpitations.  Some lightheadedness and falls.  Continues to be short of breath.  Going to the bathroom frequently on the diuretics.  Sometimes leaks.  Also having problems with diarrhea and soiling his pants.  Increasingly anxious about eating because of postprandial nausea and vomiting.  Losing weight..     Progressive concerns re his memory   Ethan Vega, age 55, HTN-1 DM -1 for CHADSVAS score >= 6                Date Cr K Hgb  Mg TSH  3/21 1.14 4.1 13.4 2.0   9/21 1.00 4.2 13.1 2.3   5/22 1.08 4.4 13.3 (3/22) 2.2 (3/22)    11/22 1.2 4.2 13.7    8/23 1.43 4.5 12.8 2.4   7/24 1.20 4.9 12.6 2.1 (4/24)  0.059  10/24 1.29 5.2 12.6                DATE TEST EF   1/16 Cath  30-35%  CA normal   4/16  Echo   45-50 %   5/16 myoview    prior infarct?? No ischemia  2/18 ECHO  40-45 %   7/20 TPP scan  Unlikely TTR amyloid  7/20 Echo  35-40% PA press est 35          Antiarrhythmics Date Reason stopped  amiodarone  /  Optic neuritis  Sotalol  4/20         Past Medical History:  Diagnosis Date   Actinic keratosis    AICD (automatic cardioverter/defibrillator) present 03/31/2014   a. 03/2014 s/p SJM 2411-36C dual chamber  AICD (serial Number [id number])- followed by Dr. Graciela Husbands   Arthritis    a. L knee.   Asthma    Basal cell carcinoma 08/29/2008   Vertex scalp. Keratotic pattern, ulcerated.   Basal cell carcinoma 01/15/2018   Left distal medial thigh near knee. Fibroepithelioma of pinkus type   Basal cell carcinoma 01/15/2018   Right distal lat. nose ant. inferior edge. Nodular pattern.   Cardiac arrest (HCC)    a. 03/30/2014 VF arrest in setting of NICM >> CPR/defib in community >> s/p dual chamber AICD   Coronary artery disease, non-occlusive    a. cath 03/2014 at [facility]: no sig CAD (OM1 30%, CFX 30%), EF 35%   Dysplastic nevus 01/31/2020   L mid back paraspinal - severe, excision 03/21/2020   HLD (hyperlipidemia)    HTN (hypertension)    Kidney stones    Melanoma (HCC)    Melanoma resected from scalp   NICM (nonischemic cardiomyopathy) (HCC)    a. 03/2014 Echo:  Inferolateral and lateral HK, EF 50-55%, grade 1 diastolic dysfunction, mild MR, mild LAE, mild RAE, trivial TR,  no effusion; 09/2018 Echo: EF 35-40%, impaired relaxation, glob HK, sev apical HK. Nl RV fxn. RVSP . Mildly dil LA.   Paroxysmal atrial fibrillation (HCC) 03/28/2015   Retinal artery occlusion     Past Surgical History:  Procedure Laterality Date   adenomatous polyps     CARDIAC CATHETERIZATION  03/30/2014   CARDIOVERSION  03/30/2014   COLONOSCOPY WITH PROPOFOL N/A 03/24/2017   Procedure: COLONOSCOPY WITH PROPOFOL;  Surgeon: Christena Deem, MD;  Location: [facility] ENDOSCOPY;  Service: Endoscopy;  Laterality: N/A;   COLONOSCOPY WITH PROPOFOL N/A 01/07/2019   Procedure: COLONOSCOPY WITH PROPOFOL;  Surgeon: Toledo, Boykin Nearing, MD;  Location: [facility] ENDOSCOPY;  Service: Gastroenterology;  Laterality: N/A;   CYSTOSCOPY W/ LITHOLAPAXY / EHL     IMPLANTABLE CARDIOVERTER DEFIBRILLATOR IMPLANT N/A 03/31/2014   Procedure: IMPLANTABLE CARDIOVERTER DEFIBRILLATOR IMPLANT;  Surgeon: Marinus Maw, MD;  Location: [facility] CATH LAB;  Service:  Cardiovascular;  Laterality: N/A;   JOINT REPLACEMENT     TONSILLECTOMY     "as a kid"   TOTAL KNEE ARTHROPLASTY Left ~ 2010   TOTAL KNEE ARTHROPLASTY WITH REVISION COMPONENTS Left ~ 2011   VARICOSE VEIN SURGERY Left ~ 2014    Current Outpatient Medications  Medication Sig Dispense Refill   apixaban (ELIQUIS) 5 MG TABS tablet Take 1 tablet (5 mg total) by mouth 2 (two) times daily. 180 tablet 3   apixaban (ELIQUIS) 5 MG TABS tablet Take 1 tablet (5 mg total) by mouth 2 (two) times daily. 42 tablet 0   atorvastatin (LIPITOR) 10 MG tablet Take 1 tablet (10 mg total) by mouth daily. 90 tablet 3   Azelastine HCl 137 MCG/SPRAY SOLN SPARY 1 SPRAY PER NARE DAILY 30 mL 1   carvedilol (COREG) 6.25 MG tablet Take 1 tablet (6.25 mg total) by mouth 2 (two) times daily with a meal. 180 tablet 2   cetirizine (ZYRTEC) 10 MG tablet Take 1 tablet (10 mg total) by mouth daily. Take 1 tablet (10 mg) by mouth once daily 30 tablet 2   diclofenac Sodium (VOLTAREN) 1 % GEL Apply 4 g topically in the morning and at bedtime. 240 g 3   donepezil (ARICEPT) 10 MG tablet Take 1 tablet (10 mg total) by mouth at bedtime. 30 tablet 1   fluticasone-salmeterol (WIXELA INHUB) 250-50 MCG/ACT AEPB TAKE 1 PUFF BY MOUTH TWICE A DAY 60 each 5   furosemide (LASIX) 40 MG tablet TAKE 1 TABLET BY MOUTH TWICE A DAY 2 DAYS A WEEK AND 1 TABLET ONCE A DAY ALL OTHER DAYS 108 tablet 3   levothyroxine (SYNTHROID) 75 MCG tablet TAKE 1 TABLET BY MOUTH EVERY DAY BEFORE BREAKFAST 90 tablet 0   losartan (COZAAR) 25 MG tablet Take 1 tablet (25 mg total) by mouth daily. 90 tablet 2   nystatin-triamcinolone ointment (MYCOLOG) Apply topically as needed.     sotalol (BETAPACE) 80 MG tablet Take 1 tablet (80 mg total) by mouth 2 (two) times daily. 180 tablet 3   spironolactone (ALDACTONE) 25 MG tablet TAKE 1 TABLET BY MOUTH EVERY DAY 90 tablet 0   triamcinolone (NASACORT) 55 MCG/ACT AERO nasal inhaler Place 1 spray into the nose 2 (two) times daily.  60 each 2   No current facility-administered medications for this visit.   Facility-Administered Medications Ordered in Other Visits  Medication Dose Route Frequency Provider Last Rate Last Admin   technetium tetrofosmin (TC-MYOVIEW) injection 23.3 millicurie  23.3 millicurie Intravenous Once PRN Pricilla Riffle, MD  Allergies  Allergen Reactions   Entresto [Sacubitril-Valsartan]     hallucinations    Review of Systems negative except from HPI and PMH  Physical Exam  BP 91/61   Pulse 64   Ht 5\' 3"  (1.6 m)   Wt 198 lb (89.8 kg)   SpO2 95%   BMI 35.07 kg/m  Well developed and well nourished in no acute distress HENT normal Neck supple with JVP-flat Clear Device pocket well healed; without hematoma or erythema.  There is no tethering  Regular rate and rhythm, no gallop 2/6 murmur Abd-soft with active BS No Clubbing cyanosis 2+ edema Skin-warm and dry A & Oriented  Grossly normal sensory and motor function  ECG sinus with atrial pacing  Device function is abnormal. 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  We will discontinue the carvedilol and cut his spironolactone from 25--12.5.  His biggest complaint currently is his GI issue with now not needing because of fear of throwing up and nausea as  well as having problems with diarrhea and soiling.  We will refer him to [facility] GI.  We will need to decide about high-voltage versus not high-voltage replacement therapy at the time of generator replacement scheduled for the end of April

## 2023-06-26 NOTE — Patient Instructions (Addendum)
 Medication Instructions:  Your physician has recommended you make the following change in your medication:   ** Stop Carvedilol  ** Decrease Spironolactone 25mg  to 1/2 tablet (12.5mg ) by mouth daily.  *If you need a refill on your cardiac medications before your next appointment, please call your pharmacy*  Lab Work: TSH when you return for echo  If you have labs (blood work) drawn today and your tests are completely normal, you will receive your results only by: MyChart Message (if you have MyChart) OR A paper copy in the mail If you have any lab test that is abnormal or we need to change your treatment, we will call you to review the results.  Testing/Procedures: None ordered.   Follow-Up: At [facility], you and your health needs are our priority.  As part of our continuing mission to provide you with exceptional heart care, our providers are all part of one team.  This team includes your primary Cardiologist (physician) and Advanced Practice Providers or APPs (Physician Assistants and Nurse Practitioners) who all work together to provide you with the care you need, when you need it.  Your next appointment:   As scheduled  Referred to GI - you will be contacted to schedule this appointment.

## 2023-06-27 LAB — CBC
Hematocrit: 41.7 % (ref 37.5–51.0)
Hemoglobin: 14.1 g/dL (ref 13.0–17.7)
MCH: 31.8 pg (ref 26.6–33.0)
MCHC: 33.8 g/dL (ref 31.5–35.7)
MCV: 94 fL (ref 79–97)
Platelets: 233 10*3/uL (ref 150–450)
RBC: 4.43 x10E6/uL (ref 4.14–5.80)
RDW: 12.8 % (ref 11.6–15.4)
WBC: 11 10*3/uL — ABNORMAL HIGH (ref 3.4–10.8)

## 2023-06-27 LAB — BASIC METABOLIC PANEL WITH GFR
BUN/Creatinine Ratio: 32 — ABNORMAL HIGH (ref 10–24)
BUN: 55 mg/dL — ABNORMAL HIGH (ref 8–27)
CO2: 21 mmol/L (ref 20–29)
Calcium: 9.9 mg/dL (ref 8.6–10.2)
Chloride: 101 mmol/L (ref 96–106)
Creatinine, Ser: 1.74 mg/dL — ABNORMAL HIGH (ref 0.76–1.27)
Glucose: 95 mg/dL (ref 70–99)
Potassium: 4.9 mmol/L (ref 3.5–5.2)
Sodium: 140 mmol/L (ref 134–144)
eGFR: 39 mL/min/{1.73_m2} — ABNORMAL LOW (ref >59)

## 2023-06-30 ENCOUNTER — Encounter: Payer: Self-pay | Admitting: Internal Medicine

## 2023-07-01 ENCOUNTER — Other Ambulatory Visit: Payer: Self-pay

## 2023-07-01 ENCOUNTER — Other Ambulatory Visit: Payer: Self-pay | Admitting: Internal Medicine

## 2023-07-01 MED ORDER — DONEPEZIL HCL 5 MG PO TABS: 5.0000 mg | ORAL_TABLET | Freq: Every day | ORAL | 1 refills | Status: DC

## 2023-07-02 DIAGNOSIS — I872 Venous insufficiency (chronic) (peripheral): Secondary | ICD-10-CM | POA: Diagnosis not present

## 2023-07-02 DIAGNOSIS — L304 Erythema intertrigo: Secondary | ICD-10-CM | POA: Diagnosis not present

## 2023-07-02 DIAGNOSIS — T490X5A Adverse effect of local antifungal, anti-infective and anti-inflammatory drugs, initial encounter: Secondary | ICD-10-CM | POA: Diagnosis not present

## 2023-07-02 DIAGNOSIS — R202 Paresthesia of skin: Secondary | ICD-10-CM | POA: Diagnosis not present

## 2023-07-05 ENCOUNTER — Ambulatory Visit

## 2023-07-05 ENCOUNTER — Encounter: Payer: Self-pay | Admitting: Internal Medicine

## 2023-07-06 ENCOUNTER — Other Ambulatory Visit: Payer: Self-pay | Admitting: Internal Medicine

## 2023-07-06 DIAGNOSIS — E039 Hypothyroidism, unspecified: Secondary | ICD-10-CM

## 2023-07-07 ENCOUNTER — Other Ambulatory Visit

## 2023-07-07 ENCOUNTER — Encounter: Payer: Self-pay | Admitting: Internal Medicine

## 2023-07-07 ENCOUNTER — Ambulatory Visit (INDEPENDENT_AMBULATORY_CARE_PROVIDER_SITE_OTHER): Payer: Medicare Other

## 2023-07-07 DIAGNOSIS — I5022 Chronic systolic (congestive) heart failure: Secondary | ICD-10-CM

## 2023-07-07 DIAGNOSIS — N1831 Chronic kidney disease, stage 3a: Secondary | ICD-10-CM

## 2023-07-08 ENCOUNTER — Ambulatory Visit: Attending: Internal Medicine

## 2023-07-08 DIAGNOSIS — I5022 Chronic systolic (congestive) heart failure: Secondary | ICD-10-CM | POA: Diagnosis not present

## 2023-07-08 DIAGNOSIS — R197 Diarrhea, unspecified: Secondary | ICD-10-CM | POA: Diagnosis not present

## 2023-07-08 LAB — CUP PACEART REMOTE DEVICE CHECK
Battery Remaining Longevity: 0 mo
Battery Voltage: 2.57 V
Brady Statistic AP VP Percent: 1 %
Brady Statistic AP VS Percent: 96 %
Brady Statistic AS VP Percent: 1 %
Brady Statistic AS VS Percent: 3 %
Brady Statistic RA Percent Paced: 95 %
Brady Statistic RV Percent Paced: 1 %
Date Time Interrogation Session: 20250421020019
HighPow Impedance: 87 Ohm
HighPow Impedance: 87 Ohm
Implantable Lead Connection Status: 753985
Implantable Lead Connection Status: 753985
Implantable Lead Implant Date: 20160114
Implantable Lead Implant Date: 20160114
Implantable Lead Location: 753859
Implantable Lead Location: 753860
Implantable Lead Model: 7122
Implantable Pulse Generator Implant Date: 20160114
Lead Channel Impedance Value: 430 Ohm
Lead Channel Impedance Value: 460 Ohm
Lead Channel Pacing Threshold Amplitude: 0.5 V
Lead Channel Pacing Threshold Amplitude: 0.75 V
Lead Channel Pacing Threshold Pulse Width: 0.5 ms
Lead Channel Pacing Threshold Pulse Width: 0.6 ms
Lead Channel Sensing Intrinsic Amplitude: 4 mV
Lead Channel Sensing Intrinsic Amplitude: 7.3 mV
Lead Channel Setting Pacing Amplitude: 1 V
Lead Channel Setting Pacing Amplitude: 1.5 V
Lead Channel Setting Pacing Pulse Width: 0.6 ms
Lead Channel Setting Sensing Sensitivity: 0.5 mV
Pulse Gen Serial Number: 7241695

## 2023-07-08 MED ORDER — PERFLUTREN LIPID MICROSPHERE
1.0000 mL | INTRAVENOUS | Status: AC | PRN
  Administered 2023-07-08: 8 mL via INTRAVENOUS

## 2023-07-09 ENCOUNTER — Encounter: Payer: Self-pay | Admitting: Internal Medicine

## 2023-07-09 LAB — TSH: TSH: 6.18 u[IU]/mL — ABNORMAL HIGH (ref 0.450–4.500)

## 2023-07-09 LAB — ECHOCARDIOGRAM COMPLETE

## 2023-07-10 ENCOUNTER — Inpatient Hospital Stay
Admission: EM | Admit: 2023-07-10 | Discharge: 2023-08-01 | DRG: 871 | Disposition: A | Discharge status: Skilled Nursing Facility | Attending: Osteopathic Medicine | Admitting: Osteopathic Medicine

## 2023-07-10 ENCOUNTER — Emergency Department

## 2023-07-10 ENCOUNTER — Other Ambulatory Visit: Payer: Self-pay

## 2023-07-10 ENCOUNTER — Encounter: Payer: Self-pay | Admitting: Pulmonary Disease

## 2023-07-10 ENCOUNTER — Ambulatory Visit: Admission: RE | Admit: 2023-07-10 | Discharge: 2023-07-10 | Attending: Internal Medicine | Admitting: Internal Medicine

## 2023-07-10 ENCOUNTER — Encounter
Admission: RE | Disposition: A | Discharge status: Other Acute Inpatient Hospital | Payer: Self-pay | Source: Home / Self Care | Attending: Internal Medicine

## 2023-07-10 ENCOUNTER — Inpatient Hospital Stay

## 2023-07-10 ENCOUNTER — Encounter: Payer: Self-pay | Admitting: Internal Medicine

## 2023-07-10 DIAGNOSIS — K269 Duodenal ulcer, unspecified as acute or chronic, without hemorrhage or perforation: Secondary | ICD-10-CM | POA: Diagnosis not present

## 2023-07-10 DIAGNOSIS — R630 Anorexia: Secondary | ICD-10-CM | POA: Diagnosis not present

## 2023-07-10 DIAGNOSIS — D62 Acute posthemorrhagic anemia: Secondary | ICD-10-CM | POA: Diagnosis not present

## 2023-07-10 DIAGNOSIS — Z95810 Presence of automatic (implantable) cardiac defibrillator: Secondary | ICD-10-CM

## 2023-07-10 DIAGNOSIS — J45909 Unspecified asthma, uncomplicated: Secondary | ICD-10-CM | POA: Diagnosis not present

## 2023-07-10 DIAGNOSIS — K264 Chronic or unspecified duodenal ulcer with hemorrhage: Secondary | ICD-10-CM | POA: Diagnosis not present

## 2023-07-10 DIAGNOSIS — F0390 Unspecified dementia without behavioral disturbance: Secondary | ICD-10-CM | POA: Diagnosis present

## 2023-07-10 DIAGNOSIS — Z45010 Encounter for checking and testing of cardiac pacemaker pulse generator [battery]: Secondary | ICD-10-CM

## 2023-07-10 DIAGNOSIS — R6521 Severe sepsis with septic shock: Secondary | ICD-10-CM | POA: Diagnosis not present

## 2023-07-10 DIAGNOSIS — Z539 Procedure and treatment not carried out, unspecified reason: Secondary | ICD-10-CM | POA: Insufficient documentation

## 2023-07-10 DIAGNOSIS — R600 Localized edema: Secondary | ICD-10-CM | POA: Diagnosis not present

## 2023-07-10 DIAGNOSIS — N200 Calculus of kidney: Secondary | ICD-10-CM | POA: Diagnosis not present

## 2023-07-10 DIAGNOSIS — I5022 Chronic systolic (congestive) heart failure: Secondary | ICD-10-CM | POA: Diagnosis not present

## 2023-07-10 DIAGNOSIS — G928 Other toxic encephalopathy: Secondary | ICD-10-CM

## 2023-07-10 DIAGNOSIS — E039 Hypothyroidism, unspecified: Secondary | ICD-10-CM | POA: Insufficient documentation

## 2023-07-10 DIAGNOSIS — N171 Acute kidney failure with acute cortical necrosis: Secondary | ICD-10-CM | POA: Diagnosis not present

## 2023-07-10 DIAGNOSIS — I4819 Other persistent atrial fibrillation: Secondary | ICD-10-CM | POA: Insufficient documentation

## 2023-07-10 DIAGNOSIS — K8020 Calculus of gallbladder without cholecystitis without obstruction: Secondary | ICD-10-CM | POA: Diagnosis not present

## 2023-07-10 DIAGNOSIS — I5042 Chronic combined systolic (congestive) and diastolic (congestive) heart failure: Secondary | ICD-10-CM | POA: Diagnosis not present

## 2023-07-10 DIAGNOSIS — L97529 Non-pressure chronic ulcer of other part of left foot with unspecified severity: Secondary | ICD-10-CM | POA: Diagnosis present

## 2023-07-10 DIAGNOSIS — Z79899 Other long term (current) drug therapy: Secondary | ICD-10-CM | POA: Insufficient documentation

## 2023-07-10 DIAGNOSIS — I472 Ventricular tachycardia, unspecified: Secondary | ICD-10-CM | POA: Diagnosis not present

## 2023-07-10 DIAGNOSIS — I428 Other cardiomyopathies: Secondary | ICD-10-CM | POA: Diagnosis not present

## 2023-07-10 DIAGNOSIS — L89152 Pressure ulcer of sacral region, stage 2: Secondary | ICD-10-CM | POA: Diagnosis not present

## 2023-07-10 DIAGNOSIS — R55 Syncope and collapse: Secondary | ICD-10-CM | POA: Diagnosis not present

## 2023-07-10 DIAGNOSIS — M79671 Pain in right foot: Secondary | ICD-10-CM | POA: Diagnosis not present

## 2023-07-10 DIAGNOSIS — K4020 Bilateral inguinal hernia, without obstruction or gangrene, not specified as recurrent: Secondary | ICD-10-CM | POA: Diagnosis not present

## 2023-07-10 DIAGNOSIS — Z743 Need for continuous supervision: Secondary | ICD-10-CM | POA: Diagnosis not present

## 2023-07-10 DIAGNOSIS — K2980 Duodenitis without bleeding: Secondary | ICD-10-CM | POA: Diagnosis not present

## 2023-07-10 DIAGNOSIS — N1832 Chronic kidney disease, stage 3b: Secondary | ICD-10-CM | POA: Diagnosis not present

## 2023-07-10 DIAGNOSIS — Z4502 Encounter for adjustment and management of automatic implantable cardiac defibrillator: Secondary | ICD-10-CM | POA: Diagnosis not present

## 2023-07-10 DIAGNOSIS — N281 Cyst of kidney, acquired: Secondary | ICD-10-CM | POA: Diagnosis not present

## 2023-07-10 DIAGNOSIS — N170 Acute kidney failure with tubular necrosis: Secondary | ICD-10-CM | POA: Diagnosis not present

## 2023-07-10 DIAGNOSIS — E2740 Unspecified adrenocortical insufficiency: Secondary | ICD-10-CM | POA: Diagnosis not present

## 2023-07-10 DIAGNOSIS — Z1152 Encounter for screening for COVID-19: Secondary | ICD-10-CM | POA: Diagnosis not present

## 2023-07-10 DIAGNOSIS — Z6841 Body Mass Index (BMI) 40.0 and over, adult: Secondary | ICD-10-CM

## 2023-07-10 DIAGNOSIS — Z0389 Encounter for observation for other suspected diseases and conditions ruled out: Secondary | ICD-10-CM | POA: Diagnosis not present

## 2023-07-10 DIAGNOSIS — I130 Hypertensive heart and chronic kidney disease with heart failure and stage 1 through stage 4 chronic kidney disease, or unspecified chronic kidney disease: Secondary | ICD-10-CM | POA: Diagnosis present

## 2023-07-10 DIAGNOSIS — A4189 Other specified sepsis: Secondary | ICD-10-CM | POA: Diagnosis not present

## 2023-07-10 DIAGNOSIS — E785 Hyperlipidemia, unspecified: Secondary | ICD-10-CM | POA: Diagnosis not present

## 2023-07-10 DIAGNOSIS — K529 Noninfective gastroenteritis and colitis, unspecified: Secondary | ICD-10-CM | POA: Diagnosis present

## 2023-07-10 DIAGNOSIS — N179 Acute kidney failure, unspecified: Secondary | ICD-10-CM | POA: Diagnosis not present

## 2023-07-10 DIAGNOSIS — N189 Chronic kidney disease, unspecified: Principal | ICD-10-CM

## 2023-07-10 DIAGNOSIS — R627 Adult failure to thrive: Secondary | ICD-10-CM | POA: Diagnosis not present

## 2023-07-10 DIAGNOSIS — A419 Sepsis, unspecified organism: Secondary | ICD-10-CM | POA: Diagnosis not present

## 2023-07-10 DIAGNOSIS — K625 Hemorrhage of anus and rectum: Secondary | ICD-10-CM

## 2023-07-10 DIAGNOSIS — K922 Gastrointestinal hemorrhage, unspecified: Secondary | ICD-10-CM | POA: Diagnosis not present

## 2023-07-10 DIAGNOSIS — I110 Hypertensive heart disease with heart failure: Secondary | ICD-10-CM | POA: Insufficient documentation

## 2023-07-10 DIAGNOSIS — S81802A Unspecified open wound, left lower leg, initial encounter: Secondary | ICD-10-CM | POA: Diagnosis not present

## 2023-07-10 DIAGNOSIS — E860 Dehydration: Secondary | ICD-10-CM | POA: Diagnosis present

## 2023-07-10 DIAGNOSIS — Z7901 Long term (current) use of anticoagulants: Secondary | ICD-10-CM

## 2023-07-10 DIAGNOSIS — R7989 Other specified abnormal findings of blood chemistry: Secondary | ICD-10-CM | POA: Diagnosis not present

## 2023-07-10 DIAGNOSIS — I482 Chronic atrial fibrillation, unspecified: Secondary | ICD-10-CM | POA: Diagnosis not present

## 2023-07-10 DIAGNOSIS — E54 Ascorbic acid deficiency: Secondary | ICD-10-CM | POA: Diagnosis not present

## 2023-07-10 DIAGNOSIS — Z8719 Personal history of other diseases of the digestive system: Secondary | ICD-10-CM | POA: Diagnosis not present

## 2023-07-10 DIAGNOSIS — Z789 Other specified health status: Secondary | ICD-10-CM | POA: Diagnosis not present

## 2023-07-10 DIAGNOSIS — E875 Hyperkalemia: Secondary | ICD-10-CM | POA: Diagnosis not present

## 2023-07-10 DIAGNOSIS — L89302 Pressure ulcer of unspecified buttock, stage 2: Secondary | ICD-10-CM | POA: Diagnosis not present

## 2023-07-10 DIAGNOSIS — L89322 Pressure ulcer of left buttock, stage 2: Secondary | ICD-10-CM | POA: Diagnosis present

## 2023-07-10 DIAGNOSIS — I700 Atherosclerosis of aorta: Secondary | ICD-10-CM | POA: Diagnosis not present

## 2023-07-10 DIAGNOSIS — G9341 Metabolic encephalopathy: Secondary | ICD-10-CM | POA: Diagnosis not present

## 2023-07-10 DIAGNOSIS — I959 Hypotension, unspecified: Secondary | ICD-10-CM | POA: Diagnosis not present

## 2023-07-10 DIAGNOSIS — Z888 Allergy status to other drugs, medicaments and biological substances status: Secondary | ICD-10-CM

## 2023-07-10 DIAGNOSIS — R0602 Shortness of breath: Secondary | ICD-10-CM | POA: Diagnosis not present

## 2023-07-10 DIAGNOSIS — L03116 Cellulitis of left lower limb: Secondary | ICD-10-CM

## 2023-07-10 DIAGNOSIS — E66811 Obesity, class 1: Secondary | ICD-10-CM | POA: Insufficient documentation

## 2023-07-10 DIAGNOSIS — Z79890 Hormone replacement therapy (postmenopausal): Secondary | ICD-10-CM

## 2023-07-10 DIAGNOSIS — R918 Other nonspecific abnormal finding of lung field: Secondary | ICD-10-CM | POA: Diagnosis not present

## 2023-07-10 DIAGNOSIS — Z85820 Personal history of malignant melanoma of skin: Secondary | ICD-10-CM

## 2023-07-10 DIAGNOSIS — Z85828 Personal history of other malignant neoplasm of skin: Secondary | ICD-10-CM

## 2023-07-10 DIAGNOSIS — J9690 Respiratory failure, unspecified, unspecified whether with hypoxia or hypercapnia: Secondary | ICD-10-CM | POA: Diagnosis not present

## 2023-07-10 DIAGNOSIS — M6259 Muscle wasting and atrophy, not elsewhere classified, multiple sites: Secondary | ICD-10-CM | POA: Diagnosis not present

## 2023-07-10 DIAGNOSIS — I4729 Other ventricular tachycardia: Secondary | ICD-10-CM | POA: Diagnosis present

## 2023-07-10 DIAGNOSIS — E8721 Acute metabolic acidosis: Secondary | ICD-10-CM | POA: Diagnosis present

## 2023-07-10 DIAGNOSIS — R710 Precipitous drop in hematocrit: Secondary | ICD-10-CM | POA: Diagnosis not present

## 2023-07-10 DIAGNOSIS — D6832 Hemorrhagic disorder due to extrinsic circulating anticoagulants: Secondary | ICD-10-CM | POA: Diagnosis not present

## 2023-07-10 DIAGNOSIS — Z711 Person with feared health complaint in whom no diagnosis is made: Secondary | ICD-10-CM | POA: Diagnosis not present

## 2023-07-10 DIAGNOSIS — R5381 Other malaise: Secondary | ICD-10-CM | POA: Diagnosis present

## 2023-07-10 DIAGNOSIS — K5900 Constipation, unspecified: Secondary | ICD-10-CM | POA: Diagnosis not present

## 2023-07-10 DIAGNOSIS — I2489 Other forms of acute ischemic heart disease: Secondary | ICD-10-CM | POA: Diagnosis present

## 2023-07-10 DIAGNOSIS — R1311 Dysphagia, oral phase: Secondary | ICD-10-CM | POA: Diagnosis not present

## 2023-07-10 DIAGNOSIS — Z8674 Personal history of sudden cardiac arrest: Secondary | ICD-10-CM

## 2023-07-10 DIAGNOSIS — Z515 Encounter for palliative care: Secondary | ICD-10-CM | POA: Diagnosis not present

## 2023-07-10 DIAGNOSIS — Z7189 Other specified counseling: Secondary | ICD-10-CM | POA: Diagnosis not present

## 2023-07-10 DIAGNOSIS — K449 Diaphragmatic hernia without obstruction or gangrene: Secondary | ICD-10-CM | POA: Diagnosis not present

## 2023-07-10 DIAGNOSIS — R404 Transient alteration of awareness: Secondary | ICD-10-CM | POA: Diagnosis not present

## 2023-07-10 DIAGNOSIS — Z87442 Personal history of urinary calculi: Secondary | ICD-10-CM

## 2023-07-10 DIAGNOSIS — K921 Melena: Secondary | ICD-10-CM | POA: Diagnosis not present

## 2023-07-10 DIAGNOSIS — L8990 Pressure ulcer of unspecified site, unspecified stage: Secondary | ICD-10-CM | POA: Insufficient documentation

## 2023-07-10 DIAGNOSIS — I2510 Atherosclerotic heart disease of native coronary artery without angina pectoris: Secondary | ICD-10-CM | POA: Diagnosis present

## 2023-07-10 DIAGNOSIS — J302 Other seasonal allergic rhinitis: Secondary | ICD-10-CM | POA: Diagnosis not present

## 2023-07-10 DIAGNOSIS — Z66 Do not resuscitate: Secondary | ICD-10-CM | POA: Diagnosis present

## 2023-07-10 DIAGNOSIS — K254 Chronic or unspecified gastric ulcer with hemorrhage: Secondary | ICD-10-CM | POA: Diagnosis not present

## 2023-07-10 DIAGNOSIS — E871 Hypo-osmolality and hyponatremia: Secondary | ICD-10-CM | POA: Insufficient documentation

## 2023-07-10 DIAGNOSIS — Z8249 Family history of ischemic heart disease and other diseases of the circulatory system: Secondary | ICD-10-CM

## 2023-07-10 DIAGNOSIS — M19072 Primary osteoarthritis, left ankle and foot: Secondary | ICD-10-CM | POA: Diagnosis not present

## 2023-07-10 DIAGNOSIS — L89312 Pressure ulcer of right buttock, stage 2: Secondary | ICD-10-CM | POA: Diagnosis present

## 2023-07-10 DIAGNOSIS — I480 Paroxysmal atrial fibrillation: Secondary | ICD-10-CM | POA: Diagnosis not present

## 2023-07-10 DIAGNOSIS — Z8679 Personal history of other diseases of the circulatory system: Secondary | ICD-10-CM

## 2023-07-10 DIAGNOSIS — K279 Peptic ulcer, site unspecified, unspecified as acute or chronic, without hemorrhage or perforation: Secondary | ICD-10-CM

## 2023-07-10 DIAGNOSIS — Z96652 Presence of left artificial knee joint: Secondary | ICD-10-CM | POA: Diagnosis present

## 2023-07-10 DIAGNOSIS — R52 Pain, unspecified: Secondary | ICD-10-CM | POA: Diagnosis not present

## 2023-07-10 DIAGNOSIS — I129 Hypertensive chronic kidney disease with stage 1 through stage 4 chronic kidney disease, or unspecified chronic kidney disease: Secondary | ICD-10-CM | POA: Diagnosis not present

## 2023-07-10 DIAGNOSIS — E569 Vitamin deficiency, unspecified: Secondary | ICD-10-CM | POA: Diagnosis not present

## 2023-07-10 DIAGNOSIS — K259 Gastric ulcer, unspecified as acute or chronic, without hemorrhage or perforation: Secondary | ICD-10-CM | POA: Diagnosis not present

## 2023-07-10 HISTORY — DX: Unspecified dementia, unspecified severity, without behavioral disturbance, psychotic disturbance, mood disturbance, and anxiety: F03.90

## 2023-07-10 LAB — URINALYSIS, W/ REFLEX TO CULTURE (INFECTION SUSPECTED)
Bilirubin Urine: NEGATIVE
Bilirubin Urine: NEGATIVE
Glucose, UA: NEGATIVE mg/dL
Glucose, UA: >=500 mg/dL — AB
Hgb urine dipstick: NEGATIVE
Ketones, ur: NEGATIVE mg/dL
Ketones, ur: 5 mg/dL — AB
Leukocytes,Ua: NEGATIVE
Leukocytes,Ua: NEGATIVE
Nitrite: NEGATIVE
Nitrite: NEGATIVE
Protein, ur: NEGATIVE mg/dL
Protein, ur: NEGATIVE mg/dL
RBC / HPF: 0 RBC/hpf (ref 0–5)
Specific Gravity, Urine: 1.016 (ref 1.005–1.030)
Specific Gravity, Urine: 1.012 (ref 1.005–1.030)
pH: 5 (ref 5.0–8.0)
pH: 5 (ref 5.0–8.0)

## 2023-07-10 LAB — CBC WITH DIFFERENTIAL/PLATELET
Abs Immature Granulocytes: 0.32 10*3/uL — ABNORMAL HIGH (ref 0.00–0.07)
Basophils Absolute: 0 10*3/uL (ref 0.0–0.1)
Basophils Relative: 0 %
Eosinophils Absolute: 0 10*3/uL (ref 0.0–0.5)
Eosinophils Relative: 0 %
HCT: 44.3 % (ref 39.0–52.0)
Hemoglobin: 15.1 g/dL (ref 13.0–17.0)
Immature Granulocytes: 2 %
Lymphocytes Relative: 3 %
Lymphs Abs: 0.5 10*3/uL — ABNORMAL LOW (ref 0.7–4.0)
MCH: 32 pg (ref 26.0–34.0)
MCHC: 34.1 g/dL (ref 30.0–36.0)
MCV: 93.9 fL (ref 80.0–100.0)
Monocytes Absolute: 1.3 10*3/uL — ABNORMAL HIGH (ref 0.1–1.0)
Monocytes Relative: 8 %
Neutro Abs: 14.1 10*3/uL — ABNORMAL HIGH (ref 1.7–7.7)
Neutrophils Relative %: 87 %
Platelets: 256 10*3/uL (ref 150–400)
RBC: 4.72 MIL/uL (ref 4.22–5.81)
RDW: 14.2 % (ref 11.5–15.5)
WBC: 16.1 10*3/uL — ABNORMAL HIGH (ref 4.0–10.5)
nRBC: 0 % (ref 0.0–0.2)

## 2023-07-10 LAB — COMPREHENSIVE METABOLIC PANEL WITH GFR
ALT: 20 U/L (ref 0–44)
AST: 32 U/L (ref 15–41)
Albumin: 3.5 g/dL (ref 3.5–5.0)
Alkaline Phosphatase: 46 U/L (ref 38–126)
Anion gap: 14 (ref 5–15)
BUN: 151 mg/dL — ABNORMAL HIGH (ref 8–23)
CO2: 20 mmol/L — ABNORMAL LOW (ref 22–32)
Calcium: 9.2 mg/dL (ref 8.9–10.3)
Chloride: 100 mmol/L (ref 98–111)
Creatinine, Ser: 5.58 mg/dL — ABNORMAL HIGH (ref 0.61–1.24)
GFR, Estimated: 10 mL/min — ABNORMAL LOW (ref >60)
Glucose, Bld: 96 mg/dL (ref 70–99)
Potassium: 5.4 mmol/L — ABNORMAL HIGH (ref 3.5–5.1)
Sodium: 134 mmol/L — ABNORMAL LOW (ref 135–145)
Total Bilirubin: 3.6 mg/dL — ABNORMAL HIGH (ref 0.0–1.2)
Total Protein: 6.5 g/dL (ref 6.5–8.1)

## 2023-07-10 LAB — CBC
HCT: 36.6 % — ABNORMAL LOW (ref 39.0–52.0)
Hemoglobin: 12.8 g/dL — ABNORMAL LOW (ref 13.0–17.0)
MCH: 32.1 pg (ref 26.0–34.0)
MCHC: 35 g/dL (ref 30.0–36.0)
MCV: 91.7 fL (ref 80.0–100.0)
Platelets: 234 10*3/uL (ref 150–400)
RBC: 3.99 MIL/uL — ABNORMAL LOW (ref 4.22–5.81)
RDW: 14.3 % (ref 11.5–15.5)
WBC: 15.6 10*3/uL — ABNORMAL HIGH (ref 4.0–10.5)
nRBC: 0 % (ref 0.0–0.2)

## 2023-07-10 LAB — PROTIME-INR
INR: 1.5 — ABNORMAL HIGH (ref 0.8–1.2)
INR: 1.7 — ABNORMAL HIGH (ref 0.8–1.2)
Prothrombin Time: 18.7 s — ABNORMAL HIGH (ref 11.4–15.2)
Prothrombin Time: 19.9 s — ABNORMAL HIGH (ref 11.4–15.2)

## 2023-07-10 LAB — LACTIC ACID, PLASMA
Lactic Acid, Venous: 2.2 mmol/L (ref 0.5–1.9)
Lactic Acid, Venous: 2.8 mmol/L (ref 0.5–1.9)
Lactic Acid, Venous: 1.1 mmol/L (ref 0.5–1.9)

## 2023-07-10 LAB — RESP PANEL BY RT-PCR (RSV, FLU A&B, COVID)  RVPGX2
Influenza A by PCR: NEGATIVE
Influenza B by PCR: NEGATIVE
Resp Syncytial Virus by PCR: NEGATIVE
SARS Coronavirus 2 by RT PCR: NEGATIVE

## 2023-07-10 LAB — TROPONIN I (HIGH SENSITIVITY)
Troponin I (High Sensitivity): 164 ng/L (ref <18)
Troponin I (High Sensitivity): 195 ng/L (ref <18)
Troponin I (High Sensitivity): 199 ng/L (ref <18)

## 2023-07-10 LAB — BLOOD GAS, VENOUS
Acid-base deficit: 1.5 mmol/L (ref 0.0–2.0)
Bicarbonate: 24.3 mmol/L (ref 20.0–28.0)
O2 Saturation: 51.7 %
Patient temperature: 37
pCO2, Ven: 44 mmHg (ref 44–60)
pH, Ven: 7.35 (ref 7.25–7.43)
pO2, Ven: 33 mmHg (ref 32–45)

## 2023-07-10 LAB — GLUCOSE, CAPILLARY: Glucose-Capillary: 125 mg/dL — ABNORMAL HIGH (ref 70–99)

## 2023-07-10 LAB — CORTISOL: Cortisol, Plasma: 12.7 ug/dL

## 2023-07-10 LAB — BRAIN NATRIURETIC PEPTIDE
B Natriuretic Peptide: 672.1 pg/mL — ABNORMAL HIGH (ref 0.0–100.0)
B Natriuretic Peptide: 1005.1 pg/mL — ABNORMAL HIGH (ref 0.0–100.0)

## 2023-07-10 LAB — AMYLASE: Amylase: 77 U/L (ref 28–100)

## 2023-07-10 LAB — CREATININE, SERUM
Creatinine, Ser: 4.19 mg/dL — ABNORMAL HIGH (ref 0.61–1.24)
GFR, Estimated: 14 mL/min — ABNORMAL LOW (ref >60)

## 2023-07-10 LAB — D-DIMER, QUANTITATIVE: D-Dimer, Quant: 0.34 ug{FEU}/mL (ref 0.00–0.50)

## 2023-07-10 LAB — LIPASE, BLOOD: Lipase: 60 U/L — ABNORMAL HIGH (ref 11–51)

## 2023-07-10 LAB — MRSA NEXT GEN BY PCR, NASAL: MRSA by PCR Next Gen: NOT DETECTED

## 2023-07-10 SURGERY — ICD GENERATOR CHANGEOUT
Anesthesia: Moderate Sedation

## 2023-07-10 MED ORDER — CHLORHEXIDINE GLUCONATE 4 % EX SOLN: 4.0000 | Freq: Once | CUTANEOUS | Status: DC

## 2023-07-10 MED ORDER — DEXTROSE 50 % IV SOLN
1.0000 | Freq: Once | INTRAVENOUS | Status: AC
  Administered 2023-07-10: 50 mL via INTRAVENOUS
  Filled 2023-07-10: qty 50

## 2023-07-10 MED ORDER — LACTATED RINGERS IV BOLUS (SEPSIS)
1000.0000 mL | Freq: Once | INTRAVENOUS | Status: AC
  Administered 2023-07-10: 1000 mL via INTRAVENOUS

## 2023-07-10 MED ORDER — SODIUM CHLORIDE 0.9% FLUSH: 3.0000 mL | Freq: Two times a day (BID) | INTRAVENOUS | Status: DC

## 2023-07-10 MED ORDER — DOCUSATE SODIUM 100 MG PO CAPS
100.0000 mg | ORAL_CAPSULE | Freq: Two times a day (BID) | ORAL | Status: DC | PRN
  Filled 2023-07-10: qty 1

## 2023-07-10 MED ORDER — NOREPINEPHRINE 4 MG/250ML-% IV SOLN: 5.0000 ug/min | INTRAVENOUS | Status: DC

## 2023-07-10 MED ORDER — SODIUM CHLORIDE 0.9 % IV SOLN
80.0000 mg | INTRAVENOUS | Status: DC
  Filled 2023-07-10: qty 2

## 2023-07-10 MED ORDER — SODIUM CHLORIDE 0.9 % IV SOLN
INTRAVENOUS | Status: DC
Start: 2023-07-10 — End: 2023-07-10

## 2023-07-10 MED ORDER — HEPARIN SODIUM (PORCINE) 1000 UNIT/ML DIALYSIS
1000.0000 [IU] | INTRAMUSCULAR | Status: DC | PRN
  Administered 2023-07-13 – 2023-07-15 (×2): 2800 [IU] via INTRAVENOUS_CENTRAL
  Filled 2023-07-10: qty 6
  Filled 2023-07-10: qty 4
  Filled 2023-07-10: qty 6
  Filled 2023-07-10: qty 3

## 2023-07-10 MED ORDER — ORAL CARE MOUTH RINSE
15.0000 mL | OROMUCOSAL | Status: DC | PRN
  Administered 2023-07-10 – 2023-07-11 (×2): 15 mL via OROMUCOSAL

## 2023-07-10 MED ORDER — LACTATED RINGERS IV SOLN: INTRAVENOUS | Status: DC

## 2023-07-10 MED ORDER — PRISMASOL BGK 4/2.5 32-4-2.5 MEQ/L EC SOLN
Status: DC
  Filled 2023-07-10 (×6): qty 5000

## 2023-07-10 MED ORDER — SODIUM CHLORIDE 0.9% FLUSH: 3.0000 mL | INTRAVENOUS | Status: DC | PRN

## 2023-07-10 MED ORDER — FENTANYL CITRATE PF 50 MCG/ML IJ SOSY
25.0000 ug | PREFILLED_SYRINGE | Freq: Once | INTRAMUSCULAR | Status: AC
  Administered 2023-07-10: 25 ug via INTRAVENOUS
  Filled 2023-07-10: qty 1

## 2023-07-10 MED ORDER — CHLORHEXIDINE GLUCONATE CLOTH 2 % EX PADS: 6.0000 | MEDICATED_PAD | Freq: Every day | CUTANEOUS | Status: DC

## 2023-07-10 MED ORDER — NOREPINEPHRINE 4 MG/250ML-% IV SOLN
0.0000 ug/min | INTRAVENOUS | Status: DC
  Administered 2023-07-10: 2 ug/min via INTRAVENOUS
  Filled 2023-07-10: qty 250

## 2023-07-10 MED ORDER — SODIUM CHLORIDE 0.9 % IV SOLN
250.0000 mL | INTRAVENOUS | Status: AC
  Administered 2023-07-10: 250 mL via INTRAVENOUS

## 2023-07-10 MED ORDER — CEFAZOLIN SODIUM-DEXTROSE 2-4 GM/100ML-% IV SOLN
INTRAVENOUS | Status: AC
  Filled 2023-07-10: qty 100

## 2023-07-10 MED ORDER — VANCOMYCIN HCL IN DEXTROSE 1-5 GM/200ML-% IV SOLN
1000.0000 mg | Freq: Once | INTRAVENOUS | Status: AC
  Administered 2023-07-10: 1000 mg via INTRAVENOUS
  Filled 2023-07-10: qty 200

## 2023-07-10 MED ORDER — VASOPRESSIN 20 UNITS/100 ML INFUSION FOR SHOCK
0.0000 [IU]/min | INTRAVENOUS | Status: DC
  Administered 2023-07-10 – 2023-07-12 (×7): 0.04 [IU]/min via INTRAVENOUS
  Administered 2023-07-13 (×2): 0.03 [IU]/min via INTRAVENOUS
  Filled 2023-07-10 (×10): qty 100

## 2023-07-10 MED ORDER — VANCOMYCIN VARIABLE DOSE PER UNSTABLE RENAL FUNCTION (PHARMACIST DOSING)
Status: DC
  Filled 2023-07-10: qty 1

## 2023-07-10 MED ORDER — HEPARIN (PORCINE) 25000 UT/250ML-% IV SOLN
1150.0000 [IU]/h | INTRAVENOUS | Status: DC
  Administered 2023-07-10: 1150 [IU]/h via INTRAVENOUS
  Filled 2023-07-10: qty 250

## 2023-07-10 MED ORDER — NOREPINEPHRINE 16 MG/250ML-% IV SOLN
0.0000 ug/min | INTRAVENOUS | Status: DC
  Administered 2023-07-10: 12 ug/min via INTRAVENOUS
  Administered 2023-07-11: 23 ug/min via INTRAVENOUS
  Administered 2023-07-11: 29 ug/min via INTRAVENOUS
  Administered 2023-07-11: 32 ug/min via INTRAVENOUS
  Administered 2023-07-12: 20 ug/min via INTRAVENOUS
  Administered 2023-07-12: 30 ug/min via INTRAVENOUS
  Administered 2023-07-13: 2 ug/min via INTRAVENOUS
  Filled 2023-07-10 (×7): qty 250

## 2023-07-10 MED ORDER — SODIUM CHLORIDE 0.9 % IV SOLN
2.0000 g | Freq: Once | INTRAVENOUS | Status: AC
  Administered 2023-07-10: 2 g via INTRAVENOUS
  Filled 2023-07-10: qty 20

## 2023-07-10 MED ORDER — LACTATED RINGERS IV BOLUS: 2000.0000 mL | Freq: Once | INTRAVENOUS | Status: DC

## 2023-07-10 MED ORDER — CEFAZOLIN SODIUM-DEXTROSE 2-4 GM/100ML-% IV SOLN: 2.0000 g | INTRAVENOUS | Status: DC

## 2023-07-10 MED ORDER — SODIUM CHLORIDE 0.9 % IV SOLN: 250.0000 mL | INTRAVENOUS | Status: DC

## 2023-07-10 MED ORDER — PRISMASOL BGK 4/2.5 32-4-2.5 MEQ/L EC SOLN
Status: DC
  Filled 2023-07-10 (×2): qty 5000

## 2023-07-10 MED ORDER — INSULIN ASPART 100 UNIT/ML IJ SOLN
10.0000 [IU] | Freq: Once | INTRAMUSCULAR | Status: AC
  Administered 2023-07-10: 10 [IU] via INTRAVENOUS
  Filled 2023-07-10: qty 1

## 2023-07-10 MED ORDER — ACETAMINOPHEN 500 MG PO TABS
1000.0000 mg | ORAL_TABLET | Freq: Once | ORAL | Status: AC
Start: 2023-07-10 — End: 2023-07-10
  Administered 2023-07-10: 1000 mg via ORAL
  Filled 2023-07-10: qty 2

## 2023-07-10 MED ORDER — VANCOMYCIN HCL IN DEXTROSE 1-5 GM/200ML-% IV SOLN
1000.0000 mg | INTRAVENOUS | Status: DC
  Filled 2023-07-10: qty 200

## 2023-07-10 MED ORDER — ORAL CARE MOUTH RINSE
15.0000 mL | OROMUCOSAL | Status: DC
  Administered 2023-07-11 – 2023-07-16 (×20): 15 mL via OROMUCOSAL

## 2023-07-10 MED ORDER — METRONIDAZOLE 500 MG/100ML IV SOLN
500.0000 mg | Freq: Two times a day (BID) | INTRAVENOUS | Status: DC
  Administered 2023-07-10 – 2023-07-12 (×5): 500 mg via INTRAVENOUS
  Filled 2023-07-10 (×5): qty 100

## 2023-07-10 MED ORDER — SODIUM BICARBONATE 8.4 % IV SOLN
50.0000 meq | Freq: Once | INTRAVENOUS | Status: AC
Start: 2023-07-10 — End: 2023-07-10
  Administered 2023-07-10: 50 meq via INTRAVENOUS
  Filled 2023-07-10: qty 50

## 2023-07-10 MED ORDER — SODIUM CHLORIDE 0.9 % IV SOLN
2.0000 g | INTRAVENOUS | Status: DC
  Administered 2023-07-11 – 2023-07-12 (×2): 2 g via INTRAVENOUS
  Filled 2023-07-10 (×2): qty 20

## 2023-07-10 MED ORDER — HEPARIN SODIUM (PORCINE) 5000 UNIT/ML IJ SOLN
5000.0000 [IU] | Freq: Three times a day (TID) | INTRAMUSCULAR | Status: DC
  Administered 2023-07-10: 5000 [IU] via SUBCUTANEOUS
  Filled 2023-07-10: qty 1

## 2023-07-10 MED ORDER — POVIDONE-IODINE 10 % EX SWAB: 2.0000 | Freq: Once | CUTANEOUS | Status: DC

## 2023-07-10 MED ORDER — HEPARIN BOLUS VIA INFUSION
4000.0000 [IU] | Freq: Once | INTRAVENOUS | Status: DC
  Filled 2023-07-10: qty 4000

## 2023-07-10 MED ORDER — FENTANYL CITRATE PF 50 MCG/ML IJ SOSY
12.5000 ug | PREFILLED_SYRINGE | Freq: Once | INTRAMUSCULAR | Status: AC
  Administered 2023-07-10: 12.5 ug via INTRAVENOUS
  Filled 2023-07-10: qty 1

## 2023-07-10 NOTE — Progress Notes (Signed)
 CODE SEPSIS - PHARMACY COMMUNICATION  **Broad Spectrum Antibiotics should be administered within 1 hour of Sepsis diagnosis**  Time Code Sepsis Called/Page Received: 0818  Antibiotics Ordered: ceftriaxone  and vancomycin   Time of 1st antibiotic administration: 0911  Additional action taken by pharmacy: Messaged RN to get antibioitcs started  If necessary, Name of Provider/Nurse Contacted: None    Ananias Balls ,PharmD Clinical Pharmacist  07/10/2023  8:31 AM

## 2023-07-10 NOTE — ED Notes (Signed)
 Xray at [facility]

## 2023-07-10 NOTE — ED Notes (Signed)
Nephrology at [facility]

## 2023-07-10 NOTE — Consult Note (Addendum)
 Pharmacy Antibiotic Note  Ethan Vega. is a 78 y.o. male admitted on 07/10/2023 with sepsis and cellulitis.  Pharmacy has been consulted for vancomycin  dosing.  Vancomycin  1000 mg IV x 1, 4/24 @ 0914  Scr 5.58 (4/10 Scr 1.74)  Plan: Give additional vancomycin  1000 mg IV x 1 to complete 2000 mg loading dose CRRT orders placed, will start Vancomycin  1000 mg IV every 24 hours starting 4/25 Ceftriaxone  2 gram IV every 24 hours per provider Follow-up renal function and cultures for adjustments  Height: 5\' 3"  (160 cm) Weight: 90.6 kg (199 lb 11.8 oz) IBW/kg (Calculated) : 56.9  Temp (24hrs), Avg:97.9 F (36.6 C), Min:97.8 F (36.6 C), Max:97.9 F (36.6 C)  Recent Labs  Lab 07/10/23 0850 07/10/23 1039  WBC 16.1*  --   CREATININE 5.58*  --   LATICACIDVEN 2.2* 2.8*    Estimated Creatinine Clearance: 10.5 mL/min (A) (by C-G formula based on SCr of 5.58 mg/dL (H)).    Allergies  Allergen Reactions   Entresto  [Sacubitril-Valsartan]     hallucinations    Antimicrobials this admission: Vancomycin  4/24 >>  ceftriaxone  4/24 >>    Microbiology results: 4/24 BCx: pending 4/24 GI panel: pending  Thank you for allowing pharmacy to be a part of this patient's care.  Ramonita Burow, PharmD 07/10/2023 1:51 PM

## 2023-07-10 NOTE — ED Triage Notes (Signed)
 Scheduled for outpt cath lab procedure today. Family unable to get pt up at home and called EMS for transport to hospital to cath lab. Procedure cancelled per Dr. Rodolfo Clan and pt transferred to ED. Endorses low BP, diarrhea x 2 weeks, decreased PO intake, not taking meds for 1 week, lethargy, general weakness, and fatigue. CL reports pt VS: 70/50, HR 60 paced. SPO2 98% RA, RR 18. Pt with BLE edema and sores to feet. H/o CAD, stents, pacemaker, and CHF. Pt alert, NAD, calm, interactive, A&Ox4, skin W&D, LS CTA. Dr. Rodolfo Clan at [facility]. EDP notified.

## 2023-07-10 NOTE — Procedures (Signed)
 Central Venous Catheter Insertion Procedure Note  Ethan Vega  [id number]  Aug 29, 1945  Date:07/10/23  Time:3:40 PM   Provider Performing:Keani Gotcher D Maryagnes Small   Procedure: Insertion of Non-tunneled Central Venous Catheter(36556)with US  guidance (65784)    Indication(s) Medication administration, Difficult access, and Hemodialysis  Consent Risks of the procedure as well as the alternatives and risks of each were explained to the patient and/or caregiver.  Consent for the procedure was obtained and is signed in the bedside chart  Anesthesia Topical only with 1% lidocaine    Timeout Verified patient identification, verified procedure, site/side was marked, verified correct patient position, special equipment/implants available, medications/allergies/relevant history reviewed, required imaging and test results available.  Sterile Technique Maximal sterile technique including full sterile barrier drape, hand hygiene, sterile gown, sterile gloves, mask, hair covering, sterile ultrasound probe cover (if used).  Procedure Description Area of catheter insertion was cleaned with chlorhexidine  and draped in sterile fashion.   With real-time ultrasound guidance a HD catheter was placed into the right femoral vein.  Nonpulsatile blood flow and easy flushing noted in all ports.  The catheter was sutured in place and sterile dressing applied.  Complications/Tolerance None; patient tolerated the procedure well. Chest X-ray is ordered to verify placement for internal jugular or subclavian cannulation.  Chest x-ray is not ordered for femoral cannulation.  EBL Minimal  Specimen(s) None    Line inserted to the 20 cm mark. BIOPATCH applied to the insertion site.     Cherylann Corpus, AGACNP-BC [facility] Prefer epic messenger for cross cover needs If after hours, please call E-link

## 2023-07-10 NOTE — ED Notes (Signed)
 Pt in CT. EDP notified about continued low BP. Will check manual.

## 2023-07-10 NOTE — IPAL (Signed)
  Interdisciplinary Goals of Care Family Meeting   Date carried out: 07/10/2023  Location of the meeting: Bedside  Member's involved: Nurse Practitioner and Family Member or next of kin  Durable Power of Attorney or acting medical decision maker: Pt's daughter Mackenzy Eisenberg  Discussion: We discussed goals of care for [employer]. .  Reviewed clinical course including Severe Sepsis with Septic shock due to cellulitis of LLE with resultant multiorgan failure (encephalopathy, AKI) which will require initiation of CCRT.  Discussed that he is critically ill with multiorgan failure, with high risk for further decompensation, cardiac arrest and death.  Elicited goals of care conversation. We discussed code status, Full Code versus Do Not Resuscitate, Encouraged her to consider DNR/DNI status understanding evidenced based poor outcomes in similar hospitalized patients, as the cause of the arrest is likely associated with chronic/terminal disease rather than a reversible acute cardio-pulmonary event.  She reports that she has had these discussions with him should he become extremely ill, and he has told her he would not want to receive CPR or be placed on a ventilator.  She does not want him to suffer, but wants to treat and see what happens.  Code status changed to DNR/DNI.  Will continue current aggressive measures (fluids, ABX, pressors, CRRT) to treat the treatable and allow time for outcomes.   Code status:   Code Status: Limited: Do not attempt resuscitation (DNR) -DNR-LIMITED -Do Not Intubate/DNI    Disposition: Continue current acute care  Time spent for the meeting: 15 minutes    Cherylann Corpus, AGACNP-BC [facility] Prefer epic messenger for cross cover needs If after hours, please call E-link  Delanna Fears, NP  07/10/2023, 6:56 PM

## 2023-07-10 NOTE — ED Notes (Signed)
Pt not in room, pt in CT.  

## 2023-07-10 NOTE — Progress Notes (Signed)
 Elink is following code sepsis.

## 2023-07-10 NOTE — ED Notes (Signed)
 Pharmacy notified of [place] heparin  previously given, d/c'ing bolus. Pending arrival of quad strength levo from pharmacy.

## 2023-07-10 NOTE — ED Notes (Addendum)
 Returned from CT, daughter refused/ delayed, pt/family educated

## 2023-07-10 NOTE — Interval H&P Note (Signed)
 History and Physical Interval Note:  07/10/2023 7:41 AM  Ethan Lex.  has presented today for surgery, with the diagnosis of ICD generator change out   Dual  Medtronic  Battery end of life.  The various methods of treatment have been discussed with the patient and family. After consideration of risks, benefits and other options for treatment, the patient has consented to  Procedure(s): ICD GENERATOR CHANGEOUT (N/A) as a surgical intervention.  The patient's history has been reviewed, patient examined, no change in status, stable for surgery.  I have reviewed the patient's chart and labs.  Questions were answered to the patient's satisfaction.     [name]  Presenting with recurrent and worsening diarrhea, BUN/CR 55/1.7 <<20s/1.2 Somnolent almost looking jaundiced Open sores on his feet  Will cancel the change out and send patient to ER for medical management and will reschedule generator change out, battery ERI 05/13/23 so have plenty of time  The family decision has also been to downgrade from CRT-D>> CRT-P; appropriate given comorbidities

## 2023-07-10 NOTE — ED Notes (Signed)
 NP at [facility] for Trialysis catheter R groin.

## 2023-07-10 NOTE — Progress Notes (Signed)
 eLink Physician-Brief Progress Note Patient Name: Ethan Vega. DOB: 09-04-1945 MRN: [id number]   Date of Service  07/10/2023  HPI/Events of Note  Patient admitted with suspected septic shock, lower extremity wounds and severe AKI with uremic encephalopathy. Work up and plans for dialysis are in progress.  eICU Interventions  New Patient Evaluation.        Shyvonne Chastang U Ressie Slevin 07/10/2023, 8:31 PM

## 2023-07-10 NOTE — Progress Notes (Signed)
 PHARMACY - ANTICOAGULATION CONSULT NOTE  Pharmacy Consult for heparin  infusion Indication: atrial fibrillation  Allergies  Allergen Reactions   Entresto  [Sacubitril-Valsartan]     hallucinations    Patient Measurements: Height: 5\' 3"  (160 cm) Weight: 90.6 kg (199 lb 11.8 oz) IBW/kg (Calculated) : 56.9 HEPARIN  DW (KG): 77  Vital Signs: Temp: 98.2 F (36.8 C) (04/24 1418) Temp Source: Oral (04/24 1418) BP: 98/60 (04/24 1515) Pulse Rate: 49 (04/24 1515)  Labs: Recent Labs    07/10/23 0850 07/10/23 1039  HGB 15.1  --   HCT 44.3  --   PLT 256  --   LABPROT 18.7*  --   INR 1.5*  --   CREATININE 5.58*  --   TROPONINIHS 164* 195*    Estimated Creatinine Clearance: 10.5 mL/min (A) (by C-G formula based on SCr of 5.58 mg/dL (H)).   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 03/2014 s/p SJM 2411-36C dual chamber AICD (serial Number [id number])- followed by Dr. Rodolfo Clan   Arthritis    a. L knee.   Asthma    Basal cell carcinoma 08/29/2008   Vertex scalp. Keratotic pattern, ulcerated.   Basal cell carcinoma 01/15/2018   Left distal medial thigh near knee. Fibroepithelioma of pinkus type   Basal cell carcinoma 01/15/2018   Right distal lat. nose ant. inferior edge. Nodular pattern.   Cardiac arrest (HCC)    a. 03/30/2014 VF arrest in setting of NICM >> CPR/defib in community >> s/p dual chamber AICD   Coronary artery disease, non-occlusive    a. cath 03/2014 at [facility]: no sig CAD (OM1 30%, CFX 30%), EF 35%   Dementia (HCC)    Dysplastic nevus 01/31/2020   L mid back paraspinal - severe, excision 03/21/2020   HLD (hyperlipidemia)    HTN (hypertension)    Kidney stones    Melanoma (HCC)    Melanoma resected from scalp   NICM (nonischemic cardiomyopathy) (HCC)    a. 03/2014 Echo:  Inferolateral and lateral HK, EF 50-55%, grade 1 diastolic dysfunction, mild MR, mild LAE, mild  RAE, trivial TR, no effusion; 09/2018 Echo: EF 35-40%, impaired relaxation, glob HK, sev apical HK. Nl RV fxn. RVSP . Mildly dil LA.   Paroxysmal atrial fibrillation (HCC) 03/28/2015   Retinal artery occlusion     Medications:  Scheduled:   dextrose   1 ampule Intravenous Once   heparin   5,000 Units Subcutaneous Q8H   insulin  aspart  10 Units Intravenous Once   sodium bicarbonate   50 mEq Intravenous Once   [START ON 07/11/2023] vancomycin  variable dose per unstable renal function (pharmacist dosing)   Does not apply See admin instructions    Assessment: 78 year old male w/ PMH of HTN, nonischemic cardiomyopathy, atrial fibrillation,  ventricular tachycardia, HLD, dementia admitted with circulatory shock with  AKI. Noted to be on apixaban  PTA with last dose 07/08/23  Goal of Therapy:  Anti-Xa level 0.3-0.7 units/ml aPTT 66 - 102 seconds Monitor platelets by anticoagulation protocol: Yes   Plan:  ---Give 4000 units IV heparin  bolus x 1 ---start heparin  infusion at 1150 units/hr ---Check aPTT level in 8 hours (we will use aPTT to guide therapy until aPTT and Anti-Xa correlate) ---Continue to monitor H&H and platelets  Adalberto Acton 07/10/2023,3:34 PM

## 2023-07-10 NOTE — ED Provider Notes (Signed)
 [facility] Provider Note   Event Date/Time   First MD Initiated Contact with Patient 07/10/23 [phone]     (approximate) History  Hypotension  HPI Ethan Vega. is a 78 y.o. male with a past medical history of heart failure, hypertension, hyperlipidemia, and paroxysmal atrial fibrillation who presents from procedure lab today for hypotension.  Family states patient has been unable to get up at home and needed EMS to transport him to the hospital.  His procedure was canceled by Dr. Rodolfo Clan due to these symptoms and vital signs.  Per patient's caregiver at bedside states low blood pressure, diarrhea for 2 weeks, intermittent vomiting, decreased p.o. intake, and no medications for the last 1 week.  Patient is also noted to have lethargy, generalized weakness, and fatigue.  Family also notes patient has had persistent bilateral lower extremity edema with chronic wounds to the bilateral feet.  Caregiver states that over the past week there has been spreading erythema up from a wound on the dorsum of the left foot ROS: Patient currently denies any vision changes, tinnitus, difficulty speaking, facial droop, sore throat, chest pain, shortness of breath, abdominal pain, nausea/vomiting/diarrhea, dysuria, or numbness/paresthesias in any extremity   Physical Exam  Triage Vital Signs: ED Triage Vitals  Encounter Vitals Group     BP      Systolic BP Percentile      Diastolic BP Percentile      Pulse      Resp      Temp      Temp src      SpO2      Weight      Height      Head Circumference      Peak Flow      Pain Score      Pain Loc      Pain Education      Exclude from Growth Chart    Most recent vital signs: Vitals:   07/10/23 1420 07/10/23 1445  BP:  (!) 99/56  Pulse: 63 (!) 49  Resp: 20 13  Temp:    SpO2: 98% 100%   General: Awake, oriented x4. CV:  Good peripheral perfusion.  Resp:  Normal effort.  Abd:  No distention.  Other:  Elderly overweight  Caucasian male resting comfortably in no acute distress.  Edema with scattered ulcerations to bilateral lower extremities.  There is a 4 cm x 3 cm area of ulceration and eschar formation with surrounding erythema that tracks up the forefoot ED Results / Procedures / Treatments  Labs (all labs ordered are listed, but only abnormal results are displayed) Labs Reviewed  COMPREHENSIVE METABOLIC PANEL WITH GFR - Abnormal; Notable for the following components:      Result Value   Sodium 134 (*)    Potassium 5.4 (*)    CO2 20 (*)    BUN 151 (*)    Creatinine, Ser 5.58 (*)    Total Bilirubin 3.6 (*)    GFR, Estimated 10 (*)    All other components within normal limits  CBC WITH DIFFERENTIAL/PLATELET - Abnormal; Notable for the following components:   WBC 16.1 (*)    Neutro Abs 14.1 (*)    Lymphs Abs 0.5 (*)    Monocytes Absolute 1.3 (*)    Abs Immature Granulocytes 0.32 (*)    All other components within normal limits  BRAIN NATRIURETIC PEPTIDE - Abnormal; Notable for the following components:   B Natriuretic Peptide 672.1 (*)  All other components within normal limits  LACTIC ACID, PLASMA - Abnormal; Notable for the following components:   Lactic Acid, Venous 2.2 (*)    All other components within normal limits  LACTIC ACID, PLASMA - Abnormal; Notable for the following components:   Lactic Acid, Venous 2.8 (*)    All other components within normal limits  PROTIME-INR - Abnormal; Notable for the following components:   Prothrombin Time 18.7 (*)    INR 1.5 (*)    All other components within normal limits  URINALYSIS, W/ REFLEX TO CULTURE (INFECTION SUSPECTED) - Abnormal; Notable for the following components:   Color, Urine YELLOW (*)    APPearance HAZY (*)    Bacteria, UA RARE (*)    All other components within normal limits  TROPONIN I (HIGH SENSITIVITY) - Abnormal; Notable for the following components:   Troponin I (High Sensitivity) 164 (*)    All other components within normal  limits  TROPONIN I (HIGH SENSITIVITY) - Abnormal; Notable for the following components:   Troponin I (High Sensitivity) 195 (*)    All other components within normal limits  RESP PANEL BY RT-PCR (RSV, FLU A&B, COVID)  RVPGX2  CULTURE, BLOOD (ROUTINE X 2)  CULTURE, BLOOD (ROUTINE X 2)  GASTROINTESTINAL PANEL BY PCR, STOOL (REPLACES STOOL CULTURE)  CULTURE, BLOOD (ROUTINE X 2)  CULTURE, BLOOD (ROUTINE X 2)  CBC  CREATININE, SERUM  AMYLASE  LIPASE, BLOOD  LACTIC ACID, PLASMA  LACTIC ACID, PLASMA  BRAIN NATRIURETIC PEPTIDE  CORTISOL  PROTIME-INR  D-DIMER, QUANTITATIVE  URINALYSIS, W/ REFLEX TO CULTURE (INFECTION SUSPECTED)   EKG ED ECG REPORT I, Charleen Conn, the attending physician, personally viewed and interpreted this ECG. Date: 07/10/2023 EKG Time: 0950 Rate: 66 Rhythm: normal sinus rhythm QRS Axis: normal Intervals: RBBB ST/T Wave abnormalities: normal Narrative Interpretation: NSR w/ RBBB. no evidence of acute ischemia RADIOLOGY ED MD interpretation: CT of the abdomen and pelvis without IV contrast independently interpreted and shows no acute intra-abdominal or pelvic pathology. One-view portable chest x-ray interpreted by me shows no evidence of acute abnormalities including no pneumonia, pneumothorax, or widened mediastinum -Agree with radiology assessment Official radiology report(s): CT ABDOMEN PELVIS WO CONTRAST Result Date: 07/10/2023 CLINICAL DATA:  Abdominal pain and intermittent vomiting.  Sepsis. EXAM: CT ABDOMEN AND PELVIS WITHOUT CONTRAST TECHNIQUE: Multidetector CT imaging of the abdomen and pelvis was performed following the standard protocol without IV contrast. RADIATION DOSE REDUCTION: This exam was performed according to the departmental dose-optimization program which includes automated exposure control, adjustment of the mA and/or kV according to patient size and/or use of iterative reconstruction technique. COMPARISON:  None Available. FINDINGS:  Evaluation of this exam is limited in the absence of intravenous contrast. Lower chest: The visualized lung bases are clear. There is coronary vascular calcification. Pacemaker wires noted. No intra-abdominal free air or free fluid. Hepatobiliary: The liver is unremarkable. No biliary ductal dilatation. Gallstone. No pericholecystic fluid or evidence of acute cholecystitis by CT. Pancreas: Unremarkable. No pancreatic ductal dilatation or surrounding inflammatory changes. Spleen: Normal in size without focal abnormality. Adrenals/Urinary Tract: The adrenal glands unremarkable. Nonobstructing right renal calculi measure up to 3-4 mm. No hydronephrosis. There is no hydronephrosis or nephrolithiasis on the left. Small bilateral renal cysts. Nonspecific bilateral perinephric stranding. Correlation with urinalysis recommended to exclude UTI. The visualized ureters and urinary bladder appear unremarkable. Stomach/Bowel: There is no bowel obstruction or active inflammation. The appendix extends into the right inguinal canal and appears unremarkable. Vascular/Lymphatic: Mild aortoiliac atherosclerotic  disease. The IVC is unremarkable. No portal venous gas. There is no adenopathy. Reproductive: The prostate and seminal vesicles are grossly unremarkable. No pelvic mass. Other: Small fat containing bilateral inguinal hernias. The right inguinal hernia contains the appendix. Musculoskeletal: Osteopenia with degenerative changes. No acute osseous pathology. IMPRESSION: 1. No acute intra-abdominal or pelvic pathology. 2. Nonobstructing right renal calculi. No hydronephrosis. 3. Cholelithiasis. 4.  Aortic Atherosclerosis (ICD10-I70.0). Electronically Signed   By: Angus Bark M.D.   On: 07/10/2023 11:51   DG Chest Port 1 View Result Date: 07/10/2023 CLINICAL DATA:  Questionable sepsis EXAM: PORTABLE CHEST 1 VIEW COMPARISON:  07/30/2017 FINDINGS: Dual-chamber pacer leads from the left in stable position. Chronic  cardiopericardial enlargement. Extensive artifact from EKG leads. There is no edema, consolidation, effusion, or pneumothorax. IMPRESSION: No evidence of active disease. Electronically Signed   By: Ronnette Coke M.D.   On: 07/10/2023 08:45   PROCEDURES: Critical Care performed: Yes, see critical care procedure note(s) .1-3 Lead EKG Interpretation  Performed by: Charleen Conn, MD Authorized by: Charleen Conn, MD     Interpretation: abnormal     ECG rate:  49   ECG rate assessment: bradycardic     Rhythm: sinus bradycardia     Ectopy: none     Conduction: normal   CRITICAL CARE Performed by: Warnie Belair K Aemilia Dedrick  Total critical care time: 37 minutes  Critical care time was exclusive of separately billable procedures and treating other patients.  Critical care was necessary to treat or prevent imminent or life-threatening deterioration.  Critical care was time spent personally by me on the following activities: development of treatment plan with patient and/or surrogate as well as nursing, discussions with consultants, evaluation of patient's response to treatment, examination of patient, obtaining history from patient or surrogate, ordering and performing treatments and interventions, ordering and review of laboratory studies, ordering and review of radiographic studies, pulse oximetry and re-evaluation of patient's condition.  MEDICATIONS ORDERED IN ED: Medications  lactated ringers  infusion (0 mLs Intravenous Stopped 07/10/23 1355)  norepinephrine  (LEVOPHED ) 4mg  in (0.016 mg/mL) premix infusion (12 mcg/min Intravenous Rate/Dose Change 07/10/23 1229)  docusate sodium  (COLACE) capsule 100 mg (has no administration in time range)  heparin  injection 5,000 Units (5,000 Units Subcutaneous Given 07/10/23 1417)  0.9 %  sodium chloride  infusion (250 mLs Intravenous New Bag/Given 07/10/23 1355)  metroNIDAZOLE  (FLAGYL ) IVPB 500 mg (500 mg Intravenous New Bag/Given 07/10/23 1415)  cefTRIAXone   (ROCEPHIN ) 2 g in sodium chloride  0.9 % 100 mL IVPB (has no administration in time range)  vancomycin  (VANCOCIN ) IVPB 1000 mg/200 mL premix (1,000 mg Intravenous New Bag/Given 07/10/23 1416)  vancomycin  variable dose per unstable renal function (pharmacist dosing) (has no administration in time range)  lactated ringers  bolus 1,000 mL (0 mLs Intravenous Stopped 07/10/23 0911)    And  lactated ringers  bolus 1,000 mL (0 mLs Intravenous Stopped 07/10/23 0911)    And  lactated ringers  bolus 1,000 mL (0 mLs Intravenous Stopped 07/10/23 0946)  cefTRIAXone  (ROCEPHIN ) 2 g in sodium chloride  0.9 % 100 mL IVPB (0 g Intravenous Stopped 07/10/23 0946)  vancomycin  (VANCOCIN ) IVPB 1000 mg/200 mL premix (0 mg Intravenous Stopped 07/10/23 1026)  acetaminophen  (TYLENOL ) tablet 1,000 mg (1,000 mg Oral Given 07/10/23 1033)  fentaNYL  (SUBLIMAZE ) injection 25 mcg (25 mcg Intravenous Given 07/10/23 1159)   IMPRESSION / MDM / ASSESSMENT AND PLAN / ED COURSE  I reviewed the triage vital signs and the nursing notes.  The patient is on the cardiac monitor to evaluate for evidence of arrhythmia and/or significant heart rate changes. Patient's presentation is most consistent with acute presentation with potential threat to life or bodily function. The Pt presents with hypotension, bradycardia, and erythema to the right foot highly concerning for sepsis (suspected skin and soft tissue source). At this time, the Pt is satting well on room air, normotensive, and appears HDS.  Will start empiric antibiotics and fluids.  Due to history of heart failure, will administer fluids gradually with frequent reassessment. Have low suspicion for a GI, skin/soft tissue, or CNS source at this time, but will reconsider if initial workup is unremarkable.  - CBC, BMP, LFTs - VBG - UA - BCx x2, Lactate - EKG - CXR - CT abdomen and pelvis negative - Empiric Abx: Rocephin  and vancomycin  - Fluids: 30cc/kg LR Despite 30  cc/kg fluid resuscitation, patient remained hypotensive and therefore was placed on Levophed  with adequate improvement in his blood pressure at 10 mcg/min Spoke to Dr. Jaclynn Mast in critical care who agrees to accept this patient onto his service for further evaluation and management. Dispo: Admit to the ICU   FINAL CLINICAL IMPRESSION(S) / ED DIAGNOSES   Final diagnoses:  Acute renal failure superimposed on chronic kidney disease, unspecified acute renal failure type, unspecified CKD stage (HCC)  Sepsis with acute renal failure and septic shock, due to unspecified organism, unspecified acute renal failure type (HCC)  Cellulitis of left foot   Rx / DC Orders   ED Discharge Orders     None      Note:  This document was prepared using Dragon voice recognition software and may include unintentional dictation errors.   Charleen Conn, MD 07/10/23 [phone]

## 2023-07-10 NOTE — ED Notes (Signed)
 EDP into room. Family at [facility].

## 2023-07-10 NOTE — Interval H&P Note (Signed)
 History and Physical Interval Note:  07/10/2023 7:49 AM  Ethan Lex.  has presented today for surgery, with the diagnosis of ICD generator change out   Dual  [employer]  Battery end of life.  The various methods of treatment have been discussed with the patient and family. After consideration of risks, benefits and other options for treatment, the patient has consented to  Procedure(s): ICD GENERATOR CHANGEOUT (N/A) as a surgical intervention.  The patient's history has been reviewed, patient examined, no change in status, stable for surgery.  I have reviewed the patient's chart and labs.  Questions were answered to the patient's satisfaction.     Ethan Vega

## 2023-07-10 NOTE — Consult Note (Signed)
 PHARMACY CONSULT NOTE - ELECTROLYTES  Pharmacy Consult for Electrolyte Monitoring and Replacement   Recent Labs: Height: 5\' 3"  (160 cm) Weight: 90.6 kg (199 lb 11.8 oz) IBW/kg (Calculated) : 56.9 Estimated Creatinine Clearance: 10.5 mL/min (A) (by C-G formula based on SCr of 5.58 mg/dL (H)). Potassium (mmol/L)  Date Value  07/10/2023 5.4 (H)  07/06/2014 4.2   Magnesium (mg/dL)  Date Value  16/12/9602 2.1   Calcium  (mg/dL)  Date Value  54/11/8117 9.2   Calcium , Total (mg/dL)  Date Value  14/78/2956 9.1   Albumin (g/dL)  Date Value  21/30/8657 3.5  01/15/2023 4.3  03/30/2014 3.5   Sodium (mmol/L)  Date Value  07/10/2023 134 (L)  06/26/2023 140  07/06/2014 141    Assessment  Ethan Cefalu. is a 78 y.o. male presenting with septic shock. PMH significant for HTN, cardiomyopathy, Afib, and HLD. Pharmacy has been consulted to monitor and replace electrolytes.  Diet: NPO MIVF: LR @ 150 mL/hr Pertinent medications: N/A  Goal of Therapy: Electrolytes WNL  Plan:  No electrolyte replacement indicated at this time Check BMP, Mg, Phos with AM labs  Thank you for allowing pharmacy to be a part of this patient's care.  Ramonita Burow, PharmD Clinical Pharmacist 07/10/2023 1:47 PM

## 2023-07-10 NOTE — H&P (Signed)
 CRITICAL CARE     Name: Ethan Vega. MRN: [id number] DOB: 29-Dec-1945     LOS: 0   SUBJECTIVE FINDINGS & SIGNIFICANT EVENTS    History of Presenting Illness:  This is an 78 year old male with a history of essential hypertension, nonischemic cardiomyopathy history of paroxysmal A-fib and ventricular tachycardia, dyslipidemia who was seen earlier today with electrophysiology service to have ICD generator change however he was noted to have open sores on his left lower extremity and was mentating with confusion and encephalopathy with decision to abort procedure and reschedule.  He was subsequently sent to the ER.  His workup revealed severe AKI and leukocytosis and vitals with shock physiology.  Patient received IV fluid resuscitation but despite 3 L of intravenous fluids continues to require Levophed  support.  He reports diarrhea over the past week.  Patient currently lives alone in his home and was previously able to perform all activities of daily living.  He is unaware of the open score and cellulitic lesion of his left foot but does admit to chronic lower extremity pitting edema.  ICU admission requested for circulatory shock with severe AKI.  Lines/tubes :   Microbiology/Sepsis markers: Results for orders placed or performed during the hospital encounter of 07/10/23  Resp panel by RT-PCR (RSV, Flu A&B, Covid) Anterior Nasal Swab     Status: None   Collection Time: 07/10/23  8:50 AM   Specimen: Anterior Nasal Swab  Result Value Ref Range Status   SARS Coronavirus 2 by RT PCR NEGATIVE NEGATIVE Final    Comment: (NOTE) SARS-CoV-2 target nucleic acids are NOT DETECTED.  The SARS-CoV-2 RNA is generally detectable in upper respiratory specimens during the acute phase of infection. The lowest concentration of  SARS-CoV-2 viral copies this assay can detect is 138 copies/mL. A negative result does not preclude SARS-Cov-2 infection and should not be used as the sole basis for treatment or other patient management decisions. A negative result may occur with  improper specimen collection/handling, submission of specimen other than nasopharyngeal swab, presence of viral mutation(s) within the areas targeted by this assay, and inadequate number of viral copies(<138 copies/mL). A negative result must be combined with clinical observations, patient history, and epidemiological information. The expected result is Negative.  Fact Sheet for Patients:  BloggerCourse.com  Fact Sheet for Healthcare Providers:  SeriousBroker.it  This test is no t yet approved or cleared by the [place]  FDA and  has been authorized for detection and/or diagnosis of SARS-CoV-2 by FDA under an Emergency Use Authorization (EUA). This EUA will remain  in effect (meaning this test can be used) for the duration of the COVID-19 declaration under Section 564(b)(1) of the Act, 21 U.S.C.section 360bbb-3(b)(1), unless the authorization is terminated  or revoked sooner.       Influenza A by PCR NEGATIVE NEGATIVE Final   Influenza B by PCR NEGATIVE NEGATIVE Final    Comment: (NOTE) The Xpert Xpress SARS-CoV-2/FLU/RSV plus assay is intended as an aid in the diagnosis of influenza from Nasopharyngeal swab specimens and should not be used as a sole basis for treatment. Nasal washings and aspirates are unacceptable for Xpert Xpress SARS-CoV-2/FLU/RSV testing.  Fact Sheet for Patients: BloggerCourse.com  Fact Sheet for Healthcare Providers: SeriousBroker.it  This test is not yet approved or cleared by the [place]  FDA and has been authorized for detection and/or diagnosis of SARS-CoV-2 by FDA under an Emergency Use  Authorization (EUA). This EUA will remain in effect (meaning this test can  be used) for the duration of the COVID-19 declaration under Section 564(b)(1) of the Act, 21 U.S.C. section 360bbb-3(b)(1), unless the authorization is terminated or revoked.     Resp Syncytial Virus by PCR NEGATIVE NEGATIVE Final    Comment: (NOTE) Fact Sheet for Patients: BloggerCourse.com  Fact Sheet for Healthcare Providers: SeriousBroker.it  This test is not yet approved or cleared by the [place]  FDA and has been authorized for detection and/or diagnosis of SARS-CoV-2 by FDA under an Emergency Use Authorization (EUA). This EUA will remain in effect (meaning this test can be used) for the duration of the COVID-19 declaration under Section 564(b)(1) of the Act, 21 U.S.C. section 360bbb-3(b)(1), unless the authorization is terminated or revoked.  Performed at [facility], [address], [place], [place] [address]     Anti-infectives:  Anti-infectives (From admission, onward)    Start     Dose/Rate Route Frequency Ordered Stop   07/10/23 0830  cefTRIAXone  (ROCEPHIN ) 2 g in sodium chloride  0.9 % 100 mL IVPB        2 g 200 mL/hr over 30 Minutes Intravenous Once 07/10/23 0818 07/10/23 0946   07/10/23 0830  vancomycin  (VANCOCIN ) IVPB 1000 mg/200 mL premix        1,000 mg 200 mL/hr over 60 Minutes Intravenous  Once 07/10/23 0818 07/10/23 1026        Consults:  Nephrology   PAST MEDICAL HISTORY   Past Medical History:  Diagnosis Date   Actinic keratosis    AICD (automatic cardioverter/defibrillator) present 03/31/2014   a. 03/2014 s/p SJM 2411-36C dual chamber AICD (serial Number [id number])- followed by Dr. Rodolfo Clan   Arthritis    a. L knee.   Asthma    Basal cell carcinoma 08/29/2008   Vertex scalp. Keratotic pattern, ulcerated.   Basal cell carcinoma 01/15/2018   Left distal medial thigh near knee. Fibroepithelioma of pinkus  type   Basal cell carcinoma 01/15/2018   Right distal lat. nose ant. inferior edge. Nodular pattern.   Cardiac arrest (HCC)    a. 03/30/2014 VF arrest in setting of NICM >> CPR/defib in community >> s/p dual chamber AICD   Coronary artery disease, non-occlusive    a. cath 03/2014 [facility]: no sig CAD (OM1 30%, CFX 30%), EF 35%   Dementia (HCC)    Dysplastic nevus 01/31/2020   L mid back paraspinal - severe, excision 03/21/2020   HLD (hyperlipidemia)    HTN (hypertension)    Kidney stones    Melanoma (HCC)    Melanoma resected from scalp   NICM (nonischemic cardiomyopathy) (HCC)    a. 03/2014 Echo:  Inferolateral and lateral HK, EF 50-55%, grade 1 diastolic dysfunction, mild MR, mild LAE, mild RAE, trivial TR, no effusion; 09/2018 Echo: EF 35-40%, impaired relaxation, glob HK, sev apical HK. Nl RV fxn. RVSP . Mildly dil LA.   Paroxysmal atrial fibrillation (HCC) 03/28/2015   Retinal artery occlusion      SURGICAL HISTORY   Past Surgical History:  Procedure Laterality Date   adenomatous polyps     CARDIAC CATHETERIZATION  03/30/2014   CARDIOVERSION  03/30/2014   COLONOSCOPY WITH PROPOFOL  N/A 03/24/2017   Procedure: COLONOSCOPY WITH PROPOFOL ;  Surgeon: Deveron Fly, MD;  Location: [facility] ENDOSCOPY;  Service: Endoscopy;  Laterality: N/A;   COLONOSCOPY WITH PROPOFOL  N/A 01/07/2019   Procedure: COLONOSCOPY WITH PROPOFOL ;  Surgeon: Toledo, Alphonsus Jeans, MD;  Location: [facility] ENDOSCOPY;  Service: Gastroenterology;  Laterality: N/A;   CYSTOSCOPY W/ LITHOLAPAXY / EHL  IMPLANTABLE CARDIOVERTER DEFIBRILLATOR IMPLANT N/A 03/31/2014   Procedure: IMPLANTABLE CARDIOVERTER DEFIBRILLATOR IMPLANT;  Surgeon: Tammie Fall, MD;  Location: [facility] CATH LAB;  Service: Cardiovascular;  Laterality: N/A;   JOINT REPLACEMENT     TONSILLECTOMY     "as a kid"   TOTAL KNEE ARTHROPLASTY Left ~ 2010   TOTAL KNEE ARTHROPLASTY WITH REVISION COMPONENTS Left ~ 2011   VARICOSE VEIN SURGERY Left ~ 2014     FAMILY  HISTORY   Family History  Problem Relation Age of Onset   Esophageal cancer Mother        died at 64   Heart attack Father        died at 32   Hypertension Brother    Colon polyps Brother      SOCIAL HISTORY   Social History   Tobacco Use   Smoking status: Never   Smokeless tobacco: Never  Vaping Use   Vaping status: Never Used  Substance Use Topics   Alcohol use: No   Drug use: No     MEDICATIONS   Current Medication:  Current Facility-Administered Medications:    0.9 %  sodium chloride  infusion, 250 mL, Intravenous, Continuous, Renne Platts, MD   docusate sodium  (COLACE) capsule 100 mg, 100 mg, Oral, BID PRN, Khyron Garno, MD   heparin  injection 5,000 Units, 5,000 Units, Subcutaneous, Q8H, Deo Mehringer, MD   lactated ringers  infusion, , Intravenous, Continuous, Bradler, Evan K, MD, Last Rate: 150 mL/hr at 07/10/23 1000, New Bag at 07/10/23 1000   norepinephrine  (LEVOPHED ) 4mg  in (0.016 mg/mL) premix infusion, 0-40 mcg/min, Intravenous, Continuous, Bradler, Evan K, MD, Last Rate: 45 mL/hr at 07/10/23 1229, 12 mcg/min at 07/10/23 1229   norepinephrine  (LEVOPHED ) 4mg  in (0.016 mg/mL) premix infusion, 5-50 mcg/min, Intravenous, Titrated, Denasia Venn, MD  Current Outpatient Medications:    acetaminophen  (TYLENOL ) 650 MG CR tablet, Take 1,300 mg by mouth every 8 (eight) hours as needed for pain., Disp: , Rfl:    apixaban  (ELIQUIS ) 5 MG TABS tablet, Take 1 tablet (5 mg total) by mouth 2 (two) times daily., Disp: 42 tablet, Rfl: 0   atorvastatin  (LIPITOR) 10 MG tablet, Take 1 tablet (10 mg total) by mouth daily., Disp: 90 tablet, Rfl: 3   Azelastine  HCl 137 MCG/SPRAY SOLN, SPARY 1 SPRAY PER NARE DAILY, Disp: 30 mL, Rfl: 1   cetirizine  (ZYRTEC ) 10 MG tablet, Take 1 tablet (10 mg total) by mouth daily. Take 1 tablet (10 mg) by mouth once daily, Disp: 30 tablet, Rfl: 2   donepezil  (ARICEPT ) 5 MG tablet, TAKE 1 TABLET BY MOUTH EVERYDAY AT BEDTIME, Disp: 90  tablet, Rfl: 1   fluticasone -salmeterol (WIXELA INHUB ) 250-50 MCG/ACT AEPB, TAKE 1 PUFF BY MOUTH TWICE A DAY, Disp: 60 each, Rfl: 5   furosemide  (LASIX ) 40 MG tablet, TAKE 1 TABLET BY MOUTH TWICE A DAY 2 DAYS A WEEK AND 1 TABLET ONCE A DAY ALL OTHER DAYS, Disp: 108 tablet, Rfl: 3   ketoconazole (NIZORAL) 2 % cream, Apply 1 Application topically 2 (two) times daily., Disp: , Rfl:    levothyroxine  (SYNTHROID ) 75 MCG tablet, TAKE 1 TABLET BY MOUTH EVERY DAY BEFORE BREAKFAST, Disp: 90 tablet, Rfl: 0   losartan  (COZAAR ) 25 MG tablet, Take 1 tablet (25 mg total) by mouth daily., Disp: 90 tablet, Rfl: 2   Multiple Vitamin (MULTIVITAMIN WITH MINERALS) TABS tablet, Take 1 tablet by mouth daily., Disp: , Rfl:    sotalol  (BETAPACE ) 80 MG tablet, Take 1 tablet (80 mg  total) by mouth 2 (two) times daily., Disp: 180 tablet, Rfl: 1   spironolactone  (ALDACTONE ) 25 MG tablet, Take 0.5 tablets (12.5 mg total) by mouth daily., Disp: , Rfl:    triamcinolone  (NASACORT ) 55 MCG/ACT AERO nasal inhaler, Place 1 spray into the nose 2 (two) times daily. (Patient taking differently: Place 1 spray into the nose daily.), Disp: 60 each, Rfl: 2  Facility-Administered Medications Ordered in Other Encounters:    technetium tetrofosmin  (TC-MYOVIEW ) injection 23.3 millicurie, 23.3 millicurie, Intravenous, Once PRN, Elmyra Haggard, MD    ALLERGIES   Entresto  [sacubitril-valsartan]    REVIEW OF SYSTEMS     10 point review of systems is negative except for back pain  PHYSICAL EXAMINATION   Vital Signs: Temp:  [97.8 F (36.6 C)-97.9 F (36.6 C)] 97.8 F (36.6 C) (04/24 0802) Pulse Rate:  [50-68] 55 (04/24 1315) Resp:  [11-23] 18 (04/24 1315) BP: (55-233)/(23-214) 100/57 (04/24 1315) SpO2:  [91 %-100 %] 100 % (04/24 1315) Weight:  [89.8 kg-90.6 kg] 90.6 kg (04/24 0806)  GENERAL: Age appropriate slow to answer with mild encephalopathy HEAD: Normocephalic, atraumatic.  EYES: Pupils equal, round, reactive to light.   No scleral icterus.  MOUTH: Moist mucosal membrane. NECK: Supple. No thyromegaly. No nodules. No JVD.  PULMONARY: Crackles at the bases bilaterally CARDIOVASCULAR: S1 and S2. Regular rate and rhythm. No murmurs, rubs, or gallops.  GASTROINTESTINAL: Soft, nontender, non-distended. No masses. Positive bowel sounds. No hepatosplenomegaly.  MUSCULOSKELETAL: No swelling, clubbing, or edema.  NEUROLOGIC: Mild distress due to acute illness no focal deficits grossly SKIN: Cellulitic lesion of the dorsal aspect of the foot with surrounding erythema and bilateral pedal edema 2+   PERTINENT DATA     Infusions:  sodium chloride      lactated ringers  150 mL/hr at 07/10/23 1000   norepinephrine  (LEVOPHED ) Adult infusion 12 mcg/min (07/10/23 1229)   norepinephrine  (LEVOPHED ) Adult infusion     Scheduled Medications:  heparin   5,000 Units Subcutaneous Q8H   PRN Medications: docusate sodium  Hemodynamic parameters:   Intake/Output: No intake/output data recorded.  Ventilator  Settings:     LAB RESULTS:  Basic Metabolic Panel: Recent Labs  Lab 07/10/23 0850  NA 134*  K 5.4*  CL 100  CO2 20*  GLUCOSE 96  BUN 151*  CREATININE 5.58*  CALCIUM  9.2   Liver Function Tests: Recent Labs  Lab 07/10/23 0850  AST 32  ALT 20  ALKPHOS 46  BILITOT 3.6*  PROT 6.5  ALBUMIN 3.5   No results for input(s): "LIPASE", "AMYLASE" in the last 168 hours. No results for input(s): "AMMONIA" in the last 168 hours. CBC: Recent Labs  Lab 07/10/23 0850  WBC 16.1*  NEUTROABS 14.1*  HGB 15.1  HCT 44.3  MCV 93.9  PLT 256   Cardiac Enzymes: No results for input(s): "CKTOTAL", "CKMB", "CKMBINDEX", "TROPONINI" in the last 168 hours. BNP: Invalid input(s): "POCBNP" CBG: No results for input(s): "GLUCAP" in the last 168 hours.     IMAGING RESULTS:     ASSESSMENT AND PLAN    -Multidisciplinary rounds held today  Septic shock -Present on admission with etiology appearing to be either  left lower extremity cellulitis or intra-abdominal infection due to prolonged diarrhea -Empiric antibiotics with Cipro and Flagyl  -MRSA PCR -use vasopressors to keep MAP>65 -follow ABG and LA -follow up cultures -Blood and urine cultures -Central line access with CVP monitoring -consider stress dose steroids - Monitor lower extremity and consider surgical evaluation if demarcation worsens  Renal Failure-KDIGO stage 4 -  Nephrology consultation -May need trialysis catheter for renal replacement therapy -follow chem 7 -follow UO -continue Foley Catheter-assess need daily   Chronic atrial fibrillation with non-ischemic cardiomyopathy - Status post ICD implantation  Altered mental status with encephalopathy Suspect toxic metabolic with overlying septic encephalopathy    ID -continue IV abx as prescibed -follow up cultures  GI/Nutrition GI PROPHYLAXIS as indicated DIET-->TF's as tolerated Constipation protocol as indicated  ENDO - ICU hypoglycemic\Hyperglycemia protocol -check FSBS per protocol   ELECTROLYTES -follow labs as needed -replace as needed -pharmacy consultation   DVT/GI PRX ordered -SCDs  TRANSFUSIONS AS NEEDED MONITOR FSBS ASSESS the need for LABS as needed    Critical care provider statement:   Total critical care time: 62 minutes   Performed by: Jaclynn Mast MD   Critical care time was exclusive of separately billable procedures and treating other patients.   Critical care was necessary to treat or prevent imminent or life-threatening deterioration.   Critical care was time spent personally by me on the following activities: development of treatment plan with patient and/or surrogate as well as nursing, discussions with consultants, evaluation of patient's response to treatment, examination of patient, obtaining history from patient or surrogate, ordering and performing treatments and interventions, ordering and review of laboratory studies,  ordering and review of radiographic studies, pulse oximetry and re-evaluation of patient's condition.    Joy Reiger, M.D.  Pulmonary & Critical Care Medicine

## 2023-07-10 NOTE — Progress Notes (Signed)
 Spoke with pt's daughter Enrique Manganaro to update her on clinical status and plan of care.  Discussed severe AKI that may need initiation of Renal replacement therapy.    She gives consent for placement of Temporary HD catheter as pt is currently encephalopathic.  Will proceed with temporary HD catheter placement.     Cherylann Corpus, AGACNP-BC [facility] Prefer epic messenger for cross cover needs If after hours, please call E-link

## 2023-07-10 NOTE — Consult Note (Signed)
 [facility] CONSULT NOTE    Date: 07/10/2023                  Patient Name:  Ethan Vega.  MRN: [id number]  DOB: September 30, 1945  Age / Sex: 78 y.o., male         PCP: Shari Daughters, MD                 Service Requesting Consult: Critical care                 Reason for Consult: Evaluation and management of severe acute kidney injury            History of Present Illness: Patient is a 78 y.o. male with a PMHx of hypertension, nonischemic cardiomyopathy, paroxysmal atrial fibrillation, history of ventricular tachycardia, hyperlipidemia, history of AICD placement, history of skin cancer, dementia, who was admitted to [facility] on 07/10/2023 for evaluation of altered mental status after being seen in EP clinic.  Patient unable to offer any history however daughter at bedside.  Apparently he has had poor p.o. intake over the past several days.  His baseline creatinine recently appears to be 1.7 with an EGFR of 39.  He was actually scheduled to see one of our providers in the office soon but became ill.  Upon presentation here BUN was 151 with a creatinine of 5.5 serum bicarbonate of 20, potassium of 5.4, and mild hyponatremia with serum sodium of 134.  After discussion with critical care we decided that we should proceed with renal placement therapy given severity of acute kidney injury.   Medications: Outpatient medications: Medications Prior to Admission  Medication Sig Dispense Refill Last Dose/Taking   acetaminophen  (TYLENOL ) 650 MG CR tablet Take 1,300 mg by mouth every 8 (eight) hours as needed for pain.   Taking As Needed   atorvastatin  (LIPITOR) 10 MG tablet Take 1 tablet (10 mg total) by mouth daily. 90 tablet 3 Past Week   Azelastine  HCl 137 MCG/SPRAY SOLN SPARY 1 SPRAY PER NARE DAILY 30 mL 1 Past Week   cetirizine  (ZYRTEC ) 10 MG tablet Take 1 tablet (10 mg total) by mouth daily. Take 1 tablet (10 mg) by mouth once daily 30 tablet 2 Past Week    fluticasone -salmeterol (WIXELA INHUB ) 250-50 MCG/ACT AEPB TAKE 1 PUFF BY MOUTH TWICE A DAY 60 each 5 Past Week   furosemide  (LASIX ) 40 MG tablet TAKE 1 TABLET BY MOUTH TWICE A DAY 2 DAYS A WEEK AND 1 TABLET ONCE A DAY ALL OTHER DAYS 108 tablet 3 Past Week   levothyroxine  (SYNTHROID ) 75 MCG tablet TAKE 1 TABLET BY MOUTH EVERY DAY BEFORE BREAKFAST 90 tablet 0 Past Week   losartan  (COZAAR ) 25 MG tablet Take 1 tablet (25 mg total) by mouth daily. 90 tablet 2 Past Week   Multiple Vitamin (MULTIVITAMIN WITH MINERALS) TABS tablet Take 1 tablet by mouth daily.   Past Week   sotalol  (BETAPACE ) 80 MG tablet Take 1 tablet (80 mg total) by mouth 2 (two) times daily. 180 tablet 1 Past Week   spironolactone  (ALDACTONE ) 25 MG tablet Take 0.5 tablets (12.5 mg total) by mouth daily.   Past Week   triamcinolone  (NASACORT ) 55 MCG/ACT AERO nasal inhaler Place 1 spray into the nose 2 (two) times daily. (Patient taking differently: Place 1 spray into the nose daily.) 60 each 2 Past Week   apixaban  (ELIQUIS ) 5 MG TABS tablet Take 1 tablet (5 mg total) by mouth 2 (  two) times daily. 42 tablet 0 07/08/2023   donepezil  (ARICEPT ) 5 MG tablet TAKE 1 TABLET BY MOUTH EVERYDAY AT BEDTIME 90 tablet 1 07/08/2023   ketoconazole (NIZORAL) 2 % cream Apply 1 Application topically 2 (two) times daily. (Patient not taking: Reported on 07/10/2023)   Not Taking    Current medications: Current Facility-Administered Medications  Medication Dose Route Frequency Provider Last Rate Last Admin   0.9 %  sodium chloride  infusion  250 mL Intravenous Continuous Aleskerov, Fuad, MD 10 mL/hr at 07/10/23 1355 250 mL at 07/10/23 1355   [START ON 07/11/2023] cefTRIAXone  (ROCEPHIN ) 2 g in sodium chloride  0.9 % 100 mL IVPB  2 g Intravenous Q24H Erskin Hearing, MD       docusate sodium  (COLACE) capsule 100 mg  100 mg Oral BID PRN Aleskerov, Fuad, MD       heparin  ADULT infusion 100 units/mL (25000 units/250mL)  1,150 Units/hr Intravenous Continuous Adalberto Acton, RPH 11.5 mL/hr at 07/10/23 1718 1,150 Units/hr at 07/10/23 1718   heparin  injection 1,000-6,000 Units  1,000-6,000 Units CRRT PRN Daliah Chaudoin, MD       metroNIDAZOLE  (FLAGYL ) IVPB 500 mg  500 mg Intravenous Q12H Aleskerov, Fuad, MD   Stopped at 07/10/23 1517   norepinephrine  (LEVOPHED ) 16 mg in 250mL (0.064 mg/mL) premix infusion  0-40 mcg/min Intravenous Titrated Cherylann Corpus D, NP 13.13 mL/hr at 07/10/23 1716 14 mcg/min at 07/10/23 1716   prismasol  BGK 4/2.5 infusion   CRRT Continuous Dimitrios Balestrieri, MD       prismasol  BGK 4/2.5 infusion   CRRT Continuous Karilyn Wind, MD       prismasol  BGK 4/2.5 infusion   CRRT Continuous Hawthorne Day, MD       [START ON 07/11/2023] vancomycin  (VANCOCIN ) IVPB 1000 mg/200 mL premix  1,000 mg Intravenous Q24H Dorothe Gaster F, [facility]       [START ON 07/11/2023] vancomycin  variable dose per unstable renal function (pharmacist dosing)   Does not apply See admin instructions Ramonita Burow, RPH       Facility-Administered Medications Ordered in Other Encounters  Medication Dose Route Frequency Provider Last Rate Last Admin   technetium tetrofosmin  (TC-MYOVIEW ) injection 23.3 millicurie  23.3 millicurie Intravenous Once PRN Elmyra Haggard, MD          Allergies: Allergies  Allergen Reactions   Entresto  [Sacubitril-Valsartan]     hallucinations      Past Medical History: Past Medical History:  Diagnosis Date   Actinic keratosis    AICD (automatic cardioverter/defibrillator) present 03/31/2014   a. 03/2014 s/p SJM 2411-36C dual chamber AICD (serial Number [id number])- followed by Dr. Rodolfo Clan   Arthritis    a. L knee.   Asthma    Basal cell carcinoma 08/29/2008   Vertex scalp. Keratotic pattern, ulcerated.   Basal cell carcinoma 01/15/2018   Left distal medial thigh near knee. Fibroepithelioma of pinkus type   Basal cell carcinoma 01/15/2018   Right distal lat. nose ant. inferior edge. Nodular pattern.   Cardiac arrest (HCC)     a. 03/30/2014 VF arrest in setting of NICM >> CPR/defib in community >> s/p dual chamber AICD   Coronary artery disease, non-occlusive    a. cath 03/2014 at [facility]: no sig CAD (OM1 30%, CFX 30%), EF 35%   Dementia (HCC)    Dysplastic nevus 01/31/2020   L mid back paraspinal - severe, excision 03/21/2020   HLD (hyperlipidemia)    HTN (hypertension)    Kidney stones    Melanoma (  HCC)    Melanoma resected from scalp   NICM (nonischemic cardiomyopathy) (HCC)    a. 03/2014 Echo:  Inferolateral and lateral HK, EF 50-55%, grade 1 diastolic dysfunction, mild MR, mild LAE, mild RAE, trivial TR, no effusion; 09/2018 Echo: EF 35-40%, impaired relaxation, glob HK, sev apical HK. Nl RV fxn. RVSP . Mildly dil LA.   Paroxysmal atrial fibrillation (HCC) 03/28/2015   Retinal artery occlusion      Past Surgical History: Past Surgical History:  Procedure Laterality Date   adenomatous polyps     CARDIAC CATHETERIZATION  03/30/2014   CARDIOVERSION  03/30/2014   COLONOSCOPY WITH PROPOFOL  N/A 03/24/2017   Procedure: COLONOSCOPY WITH PROPOFOL ;  Surgeon: Deveron Fly, MD;  Location: [facility] ENDOSCOPY;  Service: Endoscopy;  Laterality: N/A;   COLONOSCOPY WITH PROPOFOL  N/A 01/07/2019   Procedure: COLONOSCOPY WITH PROPOFOL ;  Surgeon: Toledo, Alphonsus Jeans, MD;  Location: [facility] ENDOSCOPY;  Service: Gastroenterology;  Laterality: N/A;   CYSTOSCOPY W/ LITHOLAPAXY / EHL     IMPLANTABLE CARDIOVERTER DEFIBRILLATOR IMPLANT N/A 03/31/2014   Procedure: IMPLANTABLE CARDIOVERTER DEFIBRILLATOR IMPLANT;  Surgeon: Tammie Fall, MD;  Location: [facility] CATH LAB;  Service: Cardiovascular;  Laterality: N/A;   JOINT REPLACEMENT     TONSILLECTOMY     "as a kid"   TOTAL KNEE ARTHROPLASTY Left ~ 2010   TOTAL KNEE ARTHROPLASTY WITH REVISION COMPONENTS Left ~ 2011   VARICOSE VEIN SURGERY Left ~ 2014     Family History: Family History  Problem Relation Age of Onset   Esophageal cancer Mother        died at 66   Heart attack Father         died at 74   Hypertension Brother    Colon polyps Brother      Social History: Social History   Socioeconomic History   Marital status: Divorced    Spouse name: Not on file   Number of children: Not on file   Years of education: Not on file   Highest education level: Not on file  Occupational History   Occupation: retired    Comment: Chartered certified accountant at [employer]  Tobacco Use   Smoking status: Never   Smokeless tobacco: Never  Vaping Use   Vaping status: Never Used  Substance and Sexual Activity   Alcohol  use: No   Drug use: No   Sexual activity: Not Currently  Other Topics Concern   Not on file  Social History Narrative   Not on file   Social Drivers of Health   Financial Resource Strain: Not on file  Food Insecurity: Not on file  Transportation Needs: Not on file  Physical Activity: Not on file  Stress: Not on file  Social Connections: Not on file  Intimate Partner Violence: Not on file     Review of Systems: Patient unable to provide secondary to acute encephalopathy  Vital Signs: Blood pressure 98/62, pulse (!) 58, temperature 98.2 F (36.8 C), temperature source Oral, resp. rate 15, height 5\' 3"  (1.6 m), weight 90.6 kg, SpO2 100%.  Weight trends: Filed Weights   07/10/23 0806  Weight: 90.6 kg     Physical Exam: General: No acute distress  Head: Normocephalic, atraumatic.  Dry oral mucosal membranes  Eyes: Anicteric  Neck: Supple  Lungs:  Clear to auscultation, normal effort  Heart: S1S2 no rubs irregular  Abdomen:  Soft, nontender, bowel sounds present  Extremities: Trace peripheral edema.  Neurologic: Arousable but confused  Skin: No acute rash  Access: Right  femoral dialysis catheter    Lab results: Basic Metabolic Panel: Recent Labs  Lab 07/10/23 0850 07/10/23 1540  NA 134*  --   K 5.4*  --   CL 100  --   CO2 20*  --   GLUCOSE 96  --   BUN 151*  --   CREATININE 5.58* 4.19*  CALCIUM  9.2  --     Liver Function  Tests: Recent Labs  Lab 07/10/23 0850  AST 32  ALT 20  ALKPHOS 46  BILITOT 3.6*  PROT 6.5  ALBUMIN 3.5   Recent Labs  Lab 07/10/23 1540  LIPASE 60*  AMYLASE 77   No results for input(s): "AMMONIA" in the last 168 hours.  CBC: Recent Labs  Lab 07/10/23 0850 07/10/23 1540  WBC 16.1* 15.6*  NEUTROABS 14.1*  --   HGB 15.1 12.8*  HCT 44.3 36.6*  MCV 93.9 91.7  PLT 256 234    Cardiac Enzymes: No results for input(s): "CKTOTAL", "CKMB", "CKMBINDEX", "TROPONINI" in the last 168 hours.  BNP: Invalid input(s): "POCBNP"  CBG: No results for input(s): "GLUCAP" in the last 168 hours.  Microbiology: Results for orders placed or performed during the hospital encounter of 07/10/23  Resp panel by RT-PCR (RSV, Flu A&B, Covid) Anterior Nasal Swab     Status: None   Collection Time: 07/10/23  8:50 AM   Specimen: Anterior Nasal Swab  Result Value Ref Range Status   SARS Coronavirus 2 by RT PCR NEGATIVE NEGATIVE Final    Comment: (NOTE) SARS-CoV-2 target nucleic acids are NOT DETECTED.  The SARS-CoV-2 RNA is generally detectable in upper respiratory specimens during the acute phase of infection. The lowest concentration of SARS-CoV-2 viral copies this assay can detect is 138 copies/mL. A negative result does not preclude SARS-Cov-2 infection and should not be used as the sole basis for treatment or other patient management decisions. A negative result may occur with  improper specimen collection/handling, submission of specimen other than nasopharyngeal swab, presence of viral mutation(s) within the areas targeted by this assay, and inadequate number of viral copies(<138 copies/mL). A negative result must be combined with clinical observations, patient history, and epidemiological information. The expected result is Negative.  Fact Sheet for Patients:  BloggerCourse.com  Fact Sheet for Healthcare Providers:   SeriousBroker.it  This test is no t yet approved or cleared by the [place]  FDA and  has been authorized for detection and/or diagnosis of SARS-CoV-2 by FDA under an Emergency Use Authorization (EUA). This EUA will remain  in effect (meaning this test can be used) for the duration of the COVID-19 declaration under Section 564(b)(1) of the Act, 21 U.S.C.section 360bbb-3(b)(1), unless the authorization is terminated  or revoked sooner.       Influenza A by PCR NEGATIVE NEGATIVE Final   Influenza B by PCR NEGATIVE NEGATIVE Final    Comment: (NOTE) The Xpert Xpress SARS-CoV-2/FLU/RSV plus assay is intended as an aid in the diagnosis of influenza from Nasopharyngeal swab specimens and should not be used as a sole basis for treatment. Nasal washings and aspirates are unacceptable for Xpert Xpress SARS-CoV-2/FLU/RSV testing.  Fact Sheet for Patients: BloggerCourse.com  Fact Sheet for Healthcare Providers: SeriousBroker.it  This test is not yet approved or cleared by the [place]  FDA and has been authorized for detection and/or diagnosis of SARS-CoV-2 by FDA under an Emergency Use Authorization (EUA). This EUA will remain in effect (meaning this test can be used) for the duration of the COVID-19 declaration under Section  564(b)(1) of the Act, 21 U.S.C. section 360bbb-3(b)(1), unless the authorization is terminated or revoked.     Resp Syncytial Virus by PCR NEGATIVE NEGATIVE Final    Comment: (NOTE) Fact Sheet for Patients: BloggerCourse.com  Fact Sheet for Healthcare Providers: SeriousBroker.it  This test is not yet approved or cleared by the [place]  FDA and has been authorized for detection and/or diagnosis of SARS-CoV-2 by FDA under an Emergency Use Authorization (EUA). This EUA will remain in effect (meaning this test can be used) for  the duration of the COVID-19 declaration under Section 564(b)(1) of the Act, 21 U.S.C. section 360bbb-3(b)(1), unless the authorization is terminated or revoked.  Performed at [facility], [address] [place], [place] [address]     Coagulation Studies: Recent Labs    07/10/23 1914 07/10/23 1540  LABPROT 18.7* 19.9*  INR 1.5* 1.7*    Urinalysis: Recent Labs    07/10/23 1120 07/10/23 1623  COLORURINE YELLOW* YELLOW*  LABSPEC 1.016 1.012  PHURINE 5.0 5.0  GLUCOSEU NEGATIVE >=500*  HGBUR NEGATIVE SMALL*  BILIRUBINUR NEGATIVE NEGATIVE  KETONESUR NEGATIVE 5*  PROTEINUR NEGATIVE NEGATIVE  NITRITE NEGATIVE NEGATIVE  LEUKOCYTESUR NEGATIVE NEGATIVE      Imaging: CT ABDOMEN PELVIS WO CONTRAST Result Date: 07/10/2023 CLINICAL DATA:  Abdominal pain and intermittent vomiting.  Sepsis. EXAM: CT ABDOMEN AND PELVIS WITHOUT CONTRAST TECHNIQUE: Multidetector CT imaging of the abdomen and pelvis was performed following the standard protocol without IV contrast. RADIATION DOSE REDUCTION: This exam was performed according to the departmental dose-optimization program which includes automated exposure control, adjustment of the mA and/or kV according to patient size and/or use of iterative reconstruction technique. COMPARISON:  None Available. FINDINGS: Evaluation of this exam is limited in the absence of intravenous contrast. Lower chest: The visualized lung bases are clear. There is coronary vascular calcification. Pacemaker wires noted. No intra-abdominal free air or free fluid. Hepatobiliary: The liver is unremarkable. No biliary ductal dilatation. Gallstone. No pericholecystic fluid or evidence of acute cholecystitis by CT. Pancreas: Unremarkable. No pancreatic ductal dilatation or surrounding inflammatory changes. Spleen: Normal in size without focal abnormality. Adrenals/Urinary Tract: The adrenal glands unremarkable. Nonobstructing right renal calculi measure up to 3-4 mm. No  hydronephrosis. There is no hydronephrosis or nephrolithiasis on the left. Small bilateral renal cysts. Nonspecific bilateral perinephric stranding. Correlation with urinalysis recommended to exclude UTI. The visualized ureters and urinary bladder appear unremarkable. Stomach/Bowel: There is no bowel obstruction or active inflammation. The appendix extends into the right inguinal canal and appears unremarkable. Vascular/Lymphatic: Mild aortoiliac atherosclerotic disease. The IVC is unremarkable. No portal venous gas. There is no adenopathy. Reproductive: The prostate and seminal vesicles are grossly unremarkable. No pelvic mass. Other: Small fat containing bilateral inguinal hernias. The right inguinal hernia contains the appendix. Musculoskeletal: Osteopenia with degenerative changes. No acute osseous pathology. IMPRESSION: 1. No acute intra-abdominal or pelvic pathology. 2. Nonobstructing right renal calculi. No hydronephrosis. 3. Cholelithiasis. 4.  Aortic Atherosclerosis (ICD10-I70.0). Electronically Signed   By: Angus Bark M.D.   On: 07/10/2023 11:51   DG Chest Port 1 View Result Date: 07/10/2023 CLINICAL DATA:  Questionable sepsis EXAM: PORTABLE CHEST 1 VIEW COMPARISON:  07/30/2017 FINDINGS: Dual-chamber pacer leads from the left in stable position. Chronic cardiopericardial enlargement. Extensive artifact from EKG leads. There is no edema, consolidation, effusion, or pneumothorax. IMPRESSION: No evidence of active disease. Electronically Signed   By: Ronnette Coke M.D.   On: 07/10/2023 08:45     Assessment & Plan: Pt is a 78  y.o. male with a PMHx of hypertension, nonischemic cardiomyopathy, paroxysmal atrial fibrillation, history of ventricular tachycardia, hyperlipidemia, history of AICD placement, history of skin cancer, dementia, who was admitted to [facility] on 07/10/2023 for evaluation of altered mental status after being seen in EP clinic.   1.  Acute kidney injury/chronic kidney disease  stage IIIb baseline EGFR 39 on 06/26/23/hypokalemia.  Patient now with acute kidney injury secondary to ATN most likely.  Patient has had rather poor p.o. intake at home over the past several days prior to admission according to the daughter.  CT scan abdomen pelvis negative for obstruction.  Given severity of acute kidney injury recommend initiation of continuous renal placement therapy.  Appreciate critical care placing right femoral temporary dialysis catheter.  Net fluid removal target will be to keep the patient even.  Monitor electrolytes closely.  Serum potassium should come down with ongoing dialysis treatments.  2.  Acute metabolic acidosis.  Serum bicarbonate 20.  As above initiating CRRT to help correct underlying acidosis.  3.  Further plan as patient progresses.

## 2023-07-10 NOTE — ED Notes (Signed)
 Xray continues at [facility].

## 2023-07-11 ENCOUNTER — Other Ambulatory Visit: Payer: Self-pay

## 2023-07-11 DIAGNOSIS — L03116 Cellulitis of left lower limb: Secondary | ICD-10-CM | POA: Diagnosis not present

## 2023-07-11 LAB — RENAL FUNCTION PANEL
Albumin: 3 g/dL — ABNORMAL LOW (ref 3.5–5.0)
Albumin: 3 g/dL — ABNORMAL LOW (ref 3.5–5.0)
Anion gap: 11 (ref 5–15)
Anion gap: 13 (ref 5–15)
BUN: 64 mg/dL — ABNORMAL HIGH (ref 8–23)
BUN: 33 mg/dL — ABNORMAL HIGH (ref 8–23)
CO2: 19 mmol/L — ABNORMAL LOW (ref 22–32)
CO2: 22 mmol/L (ref 22–32)
Calcium: 8.3 mg/dL — ABNORMAL LOW (ref 8.9–10.3)
Calcium: 7.5 mg/dL — ABNORMAL LOW (ref 8.9–10.3)
Chloride: 104 mmol/L (ref 98–111)
Chloride: 102 mmol/L (ref 98–111)
Creatinine, Ser: 1.85 mg/dL — ABNORMAL HIGH (ref 0.61–1.24)
Creatinine, Ser: 1.27 mg/dL — ABNORMAL HIGH (ref 0.61–1.24)
GFR, Estimated: 36 mL/min — ABNORMAL LOW (ref >60)
GFR, Estimated: 57 mL/min — ABNORMAL LOW (ref >60)
Glucose, Bld: 124 mg/dL — ABNORMAL HIGH (ref 70–99)
Glucose, Bld: 178 mg/dL — ABNORMAL HIGH (ref 70–99)
Phosphorus: 2.2 mg/dL — ABNORMAL LOW (ref 2.5–4.6)
Phosphorus: 2 mg/dL — ABNORMAL LOW (ref 2.5–4.6)
Potassium: 4.5 mmol/L (ref 3.5–5.1)
Potassium: 4.9 mmol/L (ref 3.5–5.1)
Sodium: 134 mmol/L — ABNORMAL LOW (ref 135–145)
Sodium: 137 mmol/L (ref 135–145)

## 2023-07-11 LAB — CBC
HCT: 38.2 % — ABNORMAL LOW (ref 39.0–52.0)
Hemoglobin: 13.9 g/dL (ref 13.0–17.0)
MCH: 32.6 pg (ref 26.0–34.0)
MCHC: 36.4 g/dL — ABNORMAL HIGH (ref 30.0–36.0)
MCV: 89.7 fL (ref 80.0–100.0)
Platelets: 215 10*3/uL (ref 150–400)
RBC: 4.26 MIL/uL (ref 4.22–5.81)
RDW: 14.2 % (ref 11.5–15.5)
WBC: 14.5 10*3/uL — ABNORMAL HIGH (ref 4.0–10.5)
nRBC: 0 % (ref 0.0–0.2)

## 2023-07-11 LAB — BASIC METABOLIC PANEL WITH GFR
Anion gap: 11 (ref 5–15)
BUN: 89 mg/dL — ABNORMAL HIGH (ref 8–23)
CO2: 20 mmol/L — ABNORMAL LOW (ref 22–32)
Calcium: 8.4 mg/dL — ABNORMAL LOW (ref 8.9–10.3)
Chloride: 104 mmol/L (ref 98–111)
Creatinine, Ser: 2.71 mg/dL — ABNORMAL HIGH (ref 0.61–1.24)
GFR, Estimated: 23 mL/min — ABNORMAL LOW (ref >60)
Glucose, Bld: 133 mg/dL — ABNORMAL HIGH (ref 70–99)
Potassium: 4.7 mmol/L (ref 3.5–5.1)
Sodium: 135 mmol/L (ref 135–145)

## 2023-07-11 LAB — APTT
aPTT: >200 s (ref 24–36)
aPTT: 150 s — ABNORMAL HIGH (ref 24–36)
aPTT: 53 s — ABNORMAL HIGH (ref 24–36)

## 2023-07-11 LAB — HEPATITIS B SURFACE ANTIGEN: Hepatitis B Surface Ag: NONREACTIVE

## 2023-07-11 LAB — GLUCOSE, CAPILLARY: Glucose-Capillary: 145 mg/dL — ABNORMAL HIGH (ref 70–99)

## 2023-07-11 LAB — TROPONIN I (HIGH SENSITIVITY): Troponin I (High Sensitivity): 173 ng/L (ref <18)

## 2023-07-11 LAB — MAGNESIUM: Magnesium: 2.2 mg/dL (ref 1.7–2.4)

## 2023-07-11 LAB — HEPARIN LEVEL (UNFRACTIONATED): Heparin Unfractionated: >1.1 [IU]/mL — ABNORMAL HIGH (ref 0.30–0.70)

## 2023-07-11 MED ORDER — CHLORHEXIDINE GLUCONATE CLOTH 2 % EX PADS: 6.0000 | MEDICATED_PAD | Freq: Every day | CUTANEOUS | Status: DC

## 2023-07-11 MED ORDER — K PHOS MONO-SOD PHOS DI & MONO 155-852-130 MG PO TABS
500.0000 mg | ORAL_TABLET | Freq: Four times a day (QID) | ORAL | Status: AC
  Administered 2023-07-11 – 2023-07-12 (×2): 500 mg via ORAL
  Filled 2023-07-11 (×4): qty 2

## 2023-07-11 MED ORDER — RENA-VITE PO TABS
1.0000 | ORAL_TABLET | Freq: Every day | ORAL | Status: DC
  Administered 2023-07-13 – 2023-07-21 (×9): 1 via ORAL
  Filled 2023-07-11 (×9): qty 1

## 2023-07-11 MED ORDER — LIDOCAINE 5 % EX PTCH
1.0000 | MEDICATED_PATCH | CUTANEOUS | Status: DC
  Administered 2023-07-11 – 2023-07-29 (×7): 1 via TRANSDERMAL
  Filled 2023-07-11 (×22): qty 1

## 2023-07-11 MED ORDER — DEXMEDETOMIDINE HCL IN NACL 400 MCG/100ML IV SOLN
INTRAVENOUS | Status: AC
Start: 2023-07-11 — End: 2023-07-11
  Filled 2023-07-11: qty 100

## 2023-07-11 MED ORDER — MEDIHONEY WOUND/BURN DRESSING EX PSTE
1.0000 | PASTE | Freq: Every day | CUTANEOUS | Status: DC
  Administered 2023-07-11 – 2023-07-16 (×6): 1 via TOPICAL
  Filled 2023-07-11: qty 44

## 2023-07-11 MED ORDER — DEXMEDETOMIDINE HCL IN NACL 400 MCG/100ML IV SOLN
0.0000 ug/kg/h | INTRAVENOUS | Status: DC
  Administered 2023-07-11: 0.4 ug/kg/h via INTRAVENOUS

## 2023-07-11 MED ORDER — ZINC OXIDE 40 % EX OINT
TOPICAL_OINTMENT | Freq: Two times a day (BID) | CUTANEOUS | Status: DC
  Administered 2023-07-11 – 2023-07-24 (×4): 1 via TOPICAL
  Filled 2023-07-11 (×3): qty 113

## 2023-07-11 MED ORDER — SODIUM CHLORIDE 0.9% FLUSH
10.0000 mL | Freq: Two times a day (BID) | INTRAVENOUS | Status: DC
  Administered 2023-07-11 – 2023-07-15 (×10): 10 mL
  Administered 2023-07-16 (×2): 20 mL
  Administered 2023-07-17 – 2023-07-19 (×4): 10 mL

## 2023-07-11 MED ORDER — ENSURE ENLIVE PO LIQD
237.0000 mL | Freq: Three times a day (TID) | ORAL | Status: DC
  Administered 2023-07-11 – 2023-08-01 (×38): 237 mL via ORAL

## 2023-07-11 MED ORDER — SODIUM CHLORIDE 0.9% FLUSH: 10.0000 mL | INTRAVENOUS | Status: DC | PRN

## 2023-07-11 MED ORDER — THIAMINE HCL 100 MG PO TABS
100.0000 mg | ORAL_TABLET | Freq: Every day | ORAL | Status: DC
  Administered 2023-07-11 – 2023-07-12 (×2): 100 mg via ORAL
  Filled 2023-07-11 (×5): qty 1

## 2023-07-11 MED ORDER — HYDROCORTISONE SOD SUC (PF) 100 MG IJ SOLR
100.0000 mg | Freq: Three times a day (TID) | INTRAMUSCULAR | Status: DC
  Administered 2023-07-11 – 2023-07-14 (×10): 100 mg via INTRAVENOUS
  Filled 2023-07-11 (×10): qty 2

## 2023-07-11 MED ORDER — VITAMIN C 500 MG PO TABS
500.0000 mg | ORAL_TABLET | Freq: Two times a day (BID) | ORAL | Status: DC
  Administered 2023-07-12 – 2023-08-01 (×39): 500 mg via ORAL
  Filled 2023-07-11 (×39): qty 1

## 2023-07-11 MED ORDER — VANCOMYCIN HCL 1500 MG/300ML IV SOLN
1500.0000 mg | INTRAVENOUS | Status: DC
  Administered 2023-07-11 – 2023-07-12 (×2): 1500 mg via INTRAVENOUS
  Filled 2023-07-11 (×5): qty 300

## 2023-07-11 MED ORDER — PNEUMOCOCCAL 20-VAL CONJ VACC 0.5 ML IM SUSY: 0.5000 mL | PREFILLED_SYRINGE | INTRAMUSCULAR | Status: DC

## 2023-07-11 MED ORDER — QUETIAPINE FUMARATE 25 MG PO TABS
25.0000 mg | ORAL_TABLET | Freq: Every day | ORAL | Status: DC
  Administered 2023-07-11 – 2023-07-13 (×2): 25 mg via ORAL
  Filled 2023-07-11 (×2): qty 1

## 2023-07-11 MED ORDER — ORAL CARE MOUTH RINSE: 15.0000 mL | OROMUCOSAL | Status: DC | PRN

## 2023-07-11 MED ORDER — HEPARIN (PORCINE) 25000 UT/250ML-% IV SOLN
900.0000 [IU]/h | INTRAVENOUS | Status: DC
  Administered 2023-07-11: 600 [IU]/h via INTRAVENOUS
  Administered 2023-07-11: 900 [IU]/h via INTRAVENOUS
  Administered 2023-07-13: 750 [IU]/h via INTRAVENOUS
  Administered 2023-07-14 – 2023-07-17 (×3): 900 [IU]/h via INTRAVENOUS
  Filled 2023-07-11 (×5): qty 250

## 2023-07-11 MED ORDER — CHLORHEXIDINE GLUCONATE CLOTH 2 % EX PADS
6.0000 | MEDICATED_PAD | Freq: Every day | CUTANEOUS | Status: DC
  Administered 2023-07-11 – 2023-07-15 (×5): 6 via TOPICAL

## 2023-07-11 NOTE — Consult Note (Signed)
 Pharmacy Antibiotic Note  Ethan Vega. is a 78 y.o. male w/ PMH of HTN, nonischemic cardiomyopathy, atrial fibrillation, ventricular tachycardia, HLD, dementia admitted on 07/10/2023 with sepsis and cellulitis w/ AKI. Pharmacy has been consulted for vancomycin  dosing.  Plan: s/p 2000 mg IV vancomycin  x 1 04/24 CRRT orders placed Continue vancomycin  1500 mg IV every 24 hours  Will order a vancomycin  level prior to 3rd dose Goal vancomycin  level 15 - 25 mcg/mL  Height: 5\' 3"  (160 cm) Weight: 99.2 kg (218 lb 11.1 oz) IBW/kg (Calculated) : 56.9  Temp (24hrs), Avg:98.2 F (36.8 C), Min:97.7 F (36.5 C), Max:98.8 F (37.1 C)  Recent Labs  Lab 07/10/23 0850 07/10/23 1039 07/10/23 1540 07/11/23 0016 07/11/23 0417  WBC 16.1*  --  15.6*  --  14.5*  CREATININE 5.58*  --  4.19* 2.71* 1.85*  LATICACIDVEN 2.2* 2.8* 1.1  --   --     Estimated Creatinine Clearance: 33.2 mL/min (A) (by C-G formula based on SCr of 1.85 mg/dL (H)).    Allergies  Allergen Reactions   Entresto  [Sacubitril-Valsartan]     hallucinations    Antimicrobials this admission: vancomycin  4/24 >>  ceftriaxone  4/24 >>  metronidazole  4/24 >>  Microbiology results: 4/24 BCx: NGTD 4/24 MRSA PCR: negative  Thank you for allowing pharmacy to be a part of this patient's care.  Adalberto Acton, PharmD 07/11/2023 7:46 AM

## 2023-07-11 NOTE — Progress Notes (Signed)
 Pt became very disoriented, pulled out PIV, pulling at tubes and lines. Yelling and swearing. NP Ouma to bedside, orders for precedex  obtained. Remains confused and hallucinating.

## 2023-07-11 NOTE — Plan of Care (Signed)
 Patient alert with confusion. Patient is on room air, no complaints of shortness of breath. Patients diet advanced. Patient only ate a couple of bites of meatloaf and ice cream. He did drink half of his ensure but said he doesn't like them. Patient remains on pressors. CRRT continued throughout shift with no complications. Foley intact with decreased output, Dr. Rhesa Celeste notified. Daughter came in this am to visit patient and updated. Continue to assess.

## 2023-07-11 NOTE — Consult Note (Signed)
 PHARMACY CONSULT NOTE - ELECTROLYTES  Pharmacy Consult for Electrolyte Monitoring and Replacement   Recent Labs: Height: 5\' 3"  (160 cm) Weight: 99.2 kg (218 lb 11.1 oz) IBW/kg (Calculated) : 56.9 Estimated Creatinine Clearance: 33.2 mL/min (A) (by C-G formula based on SCr of 1.85 mg/dL (H)). Potassium (mmol/L)  Date Value  07/11/2023 4.5  07/06/2014 4.2   Magnesium (mg/dL)  Date Value  40/98/1191 2.2   Calcium  (mg/dL)  Date Value  47/82/9562 8.3 (L)   Calcium , Total (mg/dL)  Date Value  13/10/6576 9.1   Albumin (g/dL)  Date Value  46/96/2952 3.0 (L)  01/15/2023 4.3  03/30/2014 3.5   Phosphorus (mg/dL)  Date Value  84/13/2440 2.2 (L)   Sodium (mmol/L)  Date Value  07/11/2023 134 (L)  06/26/2023 140  07/06/2014 141    Assessment  Ethan Hudock. is a 78 y.o. male presenting with septic shock. PMH significant for HTN, cardiomyopathy, Afib, and HLD. Pharmacy has been consulted to monitor and replace electrolytes.  Goal of Therapy: Electrolytes WNL  Plan:  No electrolyte replacement indicated at this time Check renal function panel BID while on CRRT   Thank you for allowing pharmacy to be a part of this patient's care.  Ethan Vega, PharmD Clinical Pharmacist 07/11/2023 7:10 AM

## 2023-07-11 NOTE — Progress Notes (Signed)
 [facility]  ROUNDING NOTE   Subjective:   Patient seen laying in bed Alert and oriented to self only Pressors: Levo and Vaso Heparin  gtt  CRRT in place, net even Urine output  Objective:  Vital signs in last 24 hours:  Temp:  [97.7 F (36.5 C)-98.8 F (37.1 C)] 98.8 F (37.1 C) (04/25 0700) Pulse Rate:  [48-85] 49 (04/25 1400) Resp:  [13-24] 16 (04/25 1400) BP: (75-118)/(39-83) 100/47 (04/25 1400) SpO2:  [93 %-100 %] 100 % (04/25 1400) Weight:  [98.8 kg-99.2 kg] 99.2 kg (04/25 0206)  Weight change:  Filed Weights   07/10/23 0806 07/10/23 1925 07/11/23 0206  Weight: 90.6 kg 98.8 kg 99.2 kg    Intake/Output: I/O last 3 completed shifts: In: 4810.9 [I.V.:2111; IV Piggyback:2699.9] Out: 1093 [Urine:730]   Intake/Output this shift:  Total I/O In: 519.8 [I.V.:319.6; IV Piggyback:200.1] Out: 719 [Urine:150]  Physical Exam: General: NAD  Head: Normocephalic, atraumatic. Moist oral mucosal membranes  Eyes: Anicteric  Lungs:  Clear to auscultation, normal effort  Heart: Regular rate and rhythm  Abdomen:  Soft, nontender  Extremities:  2+ peripheral edema.  Neurologic: Nonfocal, moving all four extremities  Skin: No lesions  Access: Rt femoral HD temp cath    Basic Metabolic Panel: Recent Labs  Lab 07/10/23 0850 07/10/23 1540 07/11/23 0016 07/11/23 0417  NA 134*  --  135 134*  K 5.4*  --  4.7 4.5  CL 100  --  104 104  CO2 20*  --  20* 19*  GLUCOSE 96  --  133* 124*  BUN 151*  --  89* 64*  CREATININE 5.58* 4.19* 2.71* 1.85*  CALCIUM  9.2  --  8.4* 8.3*  MG  --   --   --  2.2  PHOS  --   --   --  2.2*    Liver Function Tests: Recent Labs  Lab 07/10/23 0850 07/11/23 0417  AST 32  --   ALT 20  --   ALKPHOS 46  --   BILITOT 3.6*  --   PROT 6.5  --   ALBUMIN 3.5 3.0*   Recent Labs  Lab 07/10/23 1540  LIPASE 60*  AMYLASE 77   No results for input(s): "AMMONIA" in the last 168 hours.  CBC: Recent Labs  Lab 07/10/23 0850  07/10/23 1540 07/11/23 0417  WBC 16.1* 15.6* 14.5*  NEUTROABS 14.1*  --   --   HGB 15.1 12.8* 13.9  HCT 44.3 36.6* 38.2*  MCV 93.9 91.7 89.7  PLT 256 234 215    Cardiac Enzymes: No results for input(s): "CKTOTAL", "CKMB", "CKMBINDEX", "TROPONINI" in the last 168 hours.  BNP: Invalid input(s): "POCBNP"  CBG: Recent Labs  Lab 07/10/23 1921  GLUCAP 125*    Microbiology: Results for orders placed or performed during the hospital encounter of 07/10/23  Resp panel by RT-PCR (RSV, Flu A&B, Covid) Anterior Nasal Swab     Status: None   Collection Time: 07/10/23  8:50 AM   Specimen: Anterior Nasal Swab  Result Value Ref Range Status   SARS Coronavirus 2 by RT PCR NEGATIVE NEGATIVE Final    Comment: (NOTE) SARS-CoV-2 target nucleic acids are NOT DETECTED.  The SARS-CoV-2 RNA is generally detectable in upper respiratory specimens during the acute phase of infection. The lowest concentration of SARS-CoV-2 viral copies this assay can detect is 138 copies/mL. A negative result does not preclude SARS-Cov-2 infection and should not be used as the sole basis for treatment or other  patient management decisions. A negative result may occur with  improper specimen collection/handling, submission of specimen other than nasopharyngeal swab, presence of viral mutation(s) within the areas targeted by this assay, and inadequate number of viral copies(<138 copies/mL). A negative result must be combined with clinical observations, patient history, and epidemiological information. The expected result is Negative.  Fact Sheet for Patients:  BloggerCourse.com  Fact Sheet for Healthcare Providers:  SeriousBroker.it  This test is no t yet approved or cleared by the [place]  FDA and  has been authorized for detection and/or diagnosis of SARS-CoV-2 by FDA under an Emergency Use Authorization (EUA). This EUA will remain  in effect (meaning  this test can be used) for the duration of the COVID-19 declaration under Section 564(b)(1) of the Act, 21 U.S.C.section 360bbb-3(b)(1), unless the authorization is terminated  or revoked sooner.       Influenza A by PCR NEGATIVE NEGATIVE Final   Influenza B by PCR NEGATIVE NEGATIVE Final    Comment: (NOTE) The Xpert Xpress SARS-CoV-2/FLU/RSV plus assay is intended as an aid in the diagnosis of influenza from Nasopharyngeal swab specimens and should not be used as a sole basis for treatment. Nasal washings and aspirates are unacceptable for Xpert Xpress SARS-CoV-2/FLU/RSV testing.  Fact Sheet for Patients: BloggerCourse.com  Fact Sheet for Healthcare Providers: SeriousBroker.it  This test is not yet approved or cleared by the [place]  FDA and has been authorized for detection and/or diagnosis of SARS-CoV-2 by FDA under an Emergency Use Authorization (EUA). This EUA will remain in effect (meaning this test can be used) for the duration of the COVID-19 declaration under Section 564(b)(1) of the Act, 21 U.S.C. section 360bbb-3(b)(1), unless the authorization is terminated or revoked.     Resp Syncytial Virus by PCR NEGATIVE NEGATIVE Final    Comment: (NOTE) Fact Sheet for Patients: BloggerCourse.com  Fact Sheet for Healthcare Providers: SeriousBroker.it  This test is not yet approved or cleared by the [place]  FDA and has been authorized for detection and/or diagnosis of SARS-CoV-2 by FDA under an Emergency Use Authorization (EUA). This EUA will remain in effect (meaning this test can be used) for the duration of the COVID-19 declaration under Section 564(b)(1) of the Act, 21 U.S.C. section 360bbb-3(b)(1), unless the authorization is terminated or revoked.  Performed at [facility], [address] [place], [place] [address]   Blood Culture (routine x  2)     Status: None (Preliminary result)   Collection Time: 07/10/23  8:50 AM   Specimen: BLOOD  Result Value Ref Range Status   Specimen Description BLOOD RIGHT [facility]  Final   Special Requests   Final    BOTTLES DRAWN AEROBIC AND ANAEROBIC Blood Culture results may not be optimal due to an inadequate volume of blood received in culture bottles   Culture   Final    NO GROWTH < 24 HOURS Performed at [facility], [address], [place], [place] [address]    Report Status PENDING  Incomplete  Blood Culture (routine x 2)     Status: None (Preliminary result)   Collection Time: 07/10/23  8:05 PM   Specimen: BLOOD RIGHT HAND  Result Value Ref Range Status   Specimen Description BLOOD RIGHT HAND  Final   Special Requests   Final    BOTTLES DRAWN AEROBIC AND ANAEROBIC Blood Culture results may not be optimal due to an inadequate volume of blood received in culture bottles   Culture   Final  NO GROWTH < 12 HOURS Performed at [facility], [address] [place], [place] [address]    Report Status PENDING  Incomplete  MRSA Next Gen by PCR, Nasal     Status: None   Collection Time: 07/10/23  8:35 PM   Specimen: Nasal Mucosa; Nasal Swab  Result Value Ref Range Status   MRSA by PCR Next Gen NOT DETECTED NOT DETECTED Final    Comment: (NOTE) The GeneXpert MRSA Assay (FDA approved for NASAL specimens only), is one component of a comprehensive MRSA colonization surveillance program. It is not intended to diagnose MRSA infection nor to guide or monitor treatment for MRSA infections. Test performance is not FDA approved in patients less than 62 years old. Performed at [facility], [address] [place], [place] [address]     Coagulation Studies: Recent Labs    07/10/23 0981 07/10/23 1540  LABPROT 18.7* 19.9*  INR 1.5* 1.7*    Urinalysis: Recent Labs    07/10/23 1120 07/10/23 1623  COLORURINE YELLOW* YELLOW*  LABSPEC 1.016 1.012  PHURINE 5.0 5.0   GLUCOSEU NEGATIVE >=500*  HGBUR NEGATIVE SMALL*  BILIRUBINUR NEGATIVE NEGATIVE  KETONESUR NEGATIVE 5*  PROTEINUR NEGATIVE NEGATIVE  NITRITE NEGATIVE NEGATIVE  LEUKOCYTESUR NEGATIVE NEGATIVE      Imaging: CT FOOT LEFT WO CONTRAST Result Date: 07/10/2023 CLINICAL DATA:  Left leg and foot wound.  Concern for osteomyelitis. EXAM: CT OF THE LOWER LEFT EXTREMITY AND FOOT WITHOUT CONTRAST TECHNIQUE: Multidetector CT imaging of the lower left extremity was performed according to the standard protocol. RADIATION DOSE REDUCTION: This exam was performed according to the departmental dose-optimization program which includes automated exposure control, adjustment of the mA and/or kV according to patient size and/or use of iterative reconstruction technique. COMPARISON:  None Available. FINDINGS: Bones/Joint/Cartilage Status post left total knee arthroplasty. Hardware is intact with appropriate alignment. No acute fracture or dislocation. No evidence of acute osteolysis or erosive changes. Mild degenerative changes of the midfoot and forefoot. Calcaneal enthesopathy at the origin of the central cord of the plantar fascia in the insertion of the Achilles tendon. Ligaments Ligaments are suboptimally evaluated by CT. Muscles and Tendons No intramuscular fluid collection. Soft tissue Diffuse subcutaneous edema extending along the mid to distal left lower extremity through the forefoot. Cutaneous irregularity along the dorsal forefoot likely represents a wound. No loculated fluid collection. No soft tissue gas. IMPRESSION: 1. Diffuse subcutaneous edema extending along the mid to distal left lower extremity through the forefoot, concerning for cellulitis. Cutaneous irregularity along the dorsal forefoot likely represents a wound. No loculated fluid collection. 2. No acute osseous abnormality. No evidence of acute osteolysis or erosive changes. 3. Intact left total knee arthroplasty. 4. Mild degenerative changes of the  midfoot and forefoot. Electronically Signed   By: Mannie Seek M.D.   On: 07/10/2023 20:27   CT TIBIA FIBULA LEFT WO CONTRAST Result Date: 07/10/2023 CLINICAL DATA:  Left leg and foot wound.  Concern for osteomyelitis. EXAM: CT OF THE LOWER LEFT EXTREMITY AND FOOT WITHOUT CONTRAST TECHNIQUE: Multidetector CT imaging of the lower left extremity was performed according to the standard protocol. RADIATION DOSE REDUCTION: This exam was performed according to the departmental dose-optimization program which includes automated exposure control, adjustment of the mA and/or kV according to patient size and/or use of iterative reconstruction technique. COMPARISON:  None Available. FINDINGS: Bones/Joint/Cartilage Status post left total knee arthroplasty. Hardware is intact with appropriate alignment. No acute fracture or dislocation. No evidence of acute osteolysis or  erosive changes. Mild degenerative changes of the midfoot and forefoot. Calcaneal enthesopathy at the origin of the central cord of the plantar fascia in the insertion of the Achilles tendon. Ligaments Ligaments are suboptimally evaluated by CT. Muscles and Tendons No intramuscular fluid collection. Soft tissue Diffuse subcutaneous edema extending along the mid to distal left lower extremity through the forefoot. Cutaneous irregularity along the dorsal forefoot likely represents a wound. No loculated fluid collection. No soft tissue gas. IMPRESSION: 1. Diffuse subcutaneous edema extending along the mid to distal left lower extremity through the forefoot, concerning for cellulitis. Cutaneous irregularity along the dorsal forefoot likely represents a wound. No loculated fluid collection. 2. No acute osseous abnormality. No evidence of acute osteolysis or erosive changes. 3. Intact left total knee arthroplasty. 4. Mild degenerative changes of the midfoot and forefoot. Electronically Signed   By: Mannie Seek M.D.   On: 07/10/2023 20:27   CT ABDOMEN  PELVIS WO CONTRAST Result Date: 07/10/2023 CLINICAL DATA:  Abdominal pain and intermittent vomiting.  Sepsis. EXAM: CT ABDOMEN AND PELVIS WITHOUT CONTRAST TECHNIQUE: Multidetector CT imaging of the abdomen and pelvis was performed following the standard protocol without IV contrast. RADIATION DOSE REDUCTION: This exam was performed according to the departmental dose-optimization program which includes automated exposure control, adjustment of the mA and/or kV according to patient size and/or use of iterative reconstruction technique. COMPARISON:  None Available. FINDINGS: Evaluation of this exam is limited in the absence of intravenous contrast. Lower chest: The visualized lung bases are clear. There is coronary vascular calcification. Pacemaker wires noted. No intra-abdominal free air or free fluid. Hepatobiliary: The liver is unremarkable. No biliary ductal dilatation. Gallstone. No pericholecystic fluid or evidence of acute cholecystitis by CT. Pancreas: Unremarkable. No pancreatic ductal dilatation or surrounding inflammatory changes. Spleen: Normal in size without focal abnormality. Adrenals/Urinary Tract: The adrenal glands unremarkable. Nonobstructing right renal calculi measure up to 3-4 mm. No hydronephrosis. There is no hydronephrosis or nephrolithiasis on the left. Small bilateral renal cysts. Nonspecific bilateral perinephric stranding. Correlation with urinalysis recommended to exclude UTI. The visualized ureters and urinary bladder appear unremarkable. Stomach/Bowel: There is no bowel obstruction or active inflammation. The appendix extends into the right inguinal canal and appears unremarkable. Vascular/Lymphatic: Mild aortoiliac atherosclerotic disease. The IVC is unremarkable. No portal venous gas. There is no adenopathy. Reproductive: The prostate and seminal vesicles are grossly unremarkable. No pelvic mass. Other: Small fat containing bilateral inguinal hernias. The right inguinal hernia  contains the appendix. Musculoskeletal: Osteopenia with degenerative changes. No acute osseous pathology. IMPRESSION: 1. No acute intra-abdominal or pelvic pathology. 2. Nonobstructing right renal calculi. No hydronephrosis. 3. Cholelithiasis. 4.  Aortic Atherosclerosis (ICD10-I70.0). Electronically Signed   By: Angus Bark M.D.   On: 07/10/2023 11:51   DG Chest Port 1 View Result Date: 07/10/2023 CLINICAL DATA:  Questionable sepsis EXAM: PORTABLE CHEST 1 VIEW COMPARISON:  07/30/2017 FINDINGS: Dual-chamber pacer leads from the left in stable position. Chronic cardiopericardial enlargement. Extensive artifact from EKG leads. There is no edema, consolidation, effusion, or pneumothorax. IMPRESSION: No evidence of active disease. Electronically Signed   By: Ronnette Coke M.D.   On: 07/10/2023 08:45     Medications:    cefTRIAXone  (ROCEPHIN )  IV Stopped (07/11/23 0924)   heparin  600 Units/hr (07/11/23 1400)   metronidazole  Stopped (07/11/23 1058)   norepinephrine  (LEVOPHED ) Adult infusion 29 mcg/min (07/11/23 1418)   prismasol  BGK 4/2.5 400 mL/hr at 07/11/23 0808   prismasol  BGK 4/2.5 400 mL/hr at 07/11/23 1610   prismasol   BGK 4/2.5 2,000 mL/hr at 07/11/23 1340   vancomycin      vasopressin  0.04 Units/min (07/11/23 1400)    vitamin C  500 mg Oral BID   Chlorhexidine  Gluconate Cloth  6 each Topical Daily   feeding supplement  237 mL Oral TID BM   hydrocortisone  sod succinate (SOLU-CORTEF ) inj  100 mg Intravenous Q8H   leptospermum manuka honey  1 Application Topical Daily   lidocaine   1 patch Transdermal Q24H   liver oil-zinc  oxide   Topical BID   multivitamin  1 tablet Oral QHS   mouth rinse  15 mL Mouth Rinse 4 times per day   sodium chloride  flush  10-40 mL Intracatheter Q12H   thiamine   100 mg Oral Daily   docusate sodium , heparin , mouth rinse, sodium chloride  flush  Assessment/ Plan:  Mr. Aj Crunkleton. is a 78 y.o.  male  with a PMHx of hypertension, nonischemic  cardiomyopathy, paroxysmal atrial fibrillation, history of ventricular tachycardia, hyperlipidemia, history of AICD placement, history of skin cancer, dementia, who was admitted to [facility] on 07/10/2023 for Cellulitis of left foot [L03.116] Septic shock (HCC) [A41.9, R65.21] Sepsis with acute renal failure and septic shock, due to unspecified organism, unspecified acute renal failure type (HCC) [A41.9, R65.21, N17.9] Acute renal failure superimposed on chronic kidney disease, unspecified acute renal failure type, unspecified CKD stage (HCC) [N17.9, N18.9]   Acute kidney injury/chronic kidney disease stage IIIb baseline EGFR 39 on 06/26/23/hypokalemia.  Patient now with acute kidney injury secondary to ATN most likely.  Patient has had rather poor p.o. intake at home over the past several days prior to admission according to the daughter.  CT scan abdomen pelvis negative for obstruction.  Given severity of acute kidney injury recommend initiation of continuous renal placement therapy.  Appreciate critical care placing right femoral temporary dialysis catheter.  CRRT progressing well. Will attempt ultrafiltration of 82ml/hr, as patient tolerates. Continue to 4K bath. Urine output continue to fluctuate. Will continue.    2.  Acute metabolic acidosis.  Serum bicarbonate 20. Continue CRRT to help correct underlying acidosis.     LOS: 1 Hulen Mandler 4/25/20252:20 PM

## 2023-07-11 NOTE — Plan of Care (Signed)
  Problem: Clinical Measurements: Goal: Respiratory complications will improve Outcome: Progressing   Problem: Activity: Goal: Risk for activity intolerance will decrease Outcome: Progressing   Problem: Safety: Goal: Ability to remain free from injury will improve Outcome: Progressing   Problem: Skin Integrity: Goal: Risk for impaired skin integrity will decrease Outcome: Progressing

## 2023-07-11 NOTE — H&P (Signed)
 CRITICAL CARE     Name: Ethan Vega. MRN: [id number] DOB: 1945/05/03     LOS: 1   SUBJECTIVE FINDINGS & SIGNIFICANT EVENTS    History of Presenting Illness:  This is an 78 year old male with a history of essential hypertension, nonischemic cardiomyopathy history of paroxysmal A-fib and ventricular tachycardia, dyslipidemia who was seen earlier today with electrophysiology service to have ICD generator change however he was noted to have open sores on his left lower extremity and was mentating with confusion and encephalopathy with decision to abort procedure and reschedule.  He was subsequently sent to the ER.  His workup revealed severe AKI and leukocytosis and vitals with shock physiology.  Patient received IV fluid resuscitation but despite 3 L of intravenous fluids continues to require Levophed  support.  He reports diarrhea over the past week.  Patient currently lives alone in his home and was previously able to perform all activities of daily living.  He is unaware of the open score and cellulitic lesion of his left foot but does admit to chronic lower extremity pitting edema.  ICU admission requested for circulatory shock with severe AKI.   07/11/23- patient s/p CRRT overnight, does have sacral decub stage 2 present on arrival. On Rovephin,flagyl , vancomycin .  LLL cellulitic lesion without exudate and no osteo per imaging.  Remains on levophed  and vasopressin .  Startign renal diet via OGT.  Lines/tubes :   Microbiology/Sepsis markers: Results for orders placed or performed during the hospital encounter of 07/10/23  Resp panel by RT-PCR (RSV, Flu A&B, Covid) Anterior Nasal Swab     Status: None   Collection Time: 07/10/23  8:50 AM   Specimen: Anterior Nasal Swab  Result Value Ref Range Status   SARS  Coronavirus 2 by RT PCR NEGATIVE NEGATIVE Final    Comment: (NOTE) SARS-CoV-2 target nucleic acids are NOT DETECTED.  The SARS-CoV-2 RNA is generally detectable in upper respiratory specimens during the acute phase of infection. The lowest concentration of SARS-CoV-2 viral copies this assay can detect is 138 copies/mL. A negative result does not preclude SARS-Cov-2 infection and should not be used as the sole basis for treatment or other patient management decisions. A negative result may occur with  improper specimen collection/handling, submission of specimen other than nasopharyngeal swab, presence of viral mutation(s) within the areas targeted by this assay, and inadequate number of viral copies(<138 copies/mL). A negative result must be combined with clinical observations, patient history, and epidemiological information. The expected result is Negative.  Fact Sheet for Patients:  BloggerCourse.com  Fact Sheet for Healthcare Providers:  SeriousBroker.it  This test is no t yet approved or cleared by the [place]  FDA and  has been authorized for detection and/or diagnosis of SARS-CoV-2 by FDA under an Emergency Use Authorization (EUA). This EUA will remain  in effect (meaning this test can be used) for the duration of the COVID-19 declaration under Section 564(b)(1) of the Act, 21 U.S.C.section 360bbb-3(b)(1), unless the authorization is terminated  or revoked sooner.       Influenza A by PCR NEGATIVE NEGATIVE Final   Influenza B by PCR NEGATIVE NEGATIVE Final    Comment: (NOTE) The Xpert Xpress SARS-CoV-2/FLU/RSV plus assay is intended as an aid in the diagnosis of influenza from Nasopharyngeal swab specimens and should not be used as a sole basis for treatment. Nasal washings and aspirates are unacceptable for Xpert Xpress SARS-CoV-2/FLU/RSV testing.  Fact Sheet for  Patients: BloggerCourse.com  Fact Sheet for Healthcare Providers: SeriousBroker.it  This test is not yet approved or cleared by the [place]  FDA and has been authorized for detection and/or diagnosis of SARS-CoV-2 by FDA under an Emergency Use Authorization (EUA). This EUA will remain in effect (meaning this test can be used) for the duration of the COVID-19 declaration under Section 564(b)(1) of the Act, 21 U.S.C. section 360bbb-3(b)(1), unless the authorization is terminated or revoked.     Resp Syncytial Virus by PCR NEGATIVE NEGATIVE Final    Comment: (NOTE) Fact Sheet for Patients: BloggerCourse.com  Fact Sheet for Healthcare Providers: SeriousBroker.it  This test is not yet approved or cleared by the [place]  FDA and has been authorized for detection and/or diagnosis of SARS-CoV-2 by FDA under an Emergency Use Authorization (EUA). This EUA will remain in effect (meaning this test can be used) for the duration of the COVID-19 declaration under Section 564(b)(1) of the Act, 21 U.S.C. section 360bbb-3(b)(1), unless the authorization is terminated or revoked.  Performed at [facility], [address] [place], [place] [address]   Blood Culture (routine x 2)     Status: None (Preliminary result)   Collection Time: 07/10/23  8:50 AM   Specimen: BLOOD  Result Value Ref Range Status   Specimen Description BLOOD RIGHT [facility]  Final   Special Requests   Final    BOTTLES DRAWN AEROBIC AND ANAEROBIC Blood Culture results may not be optimal due to an inadequate volume of blood received in culture bottles   Culture   Final    NO GROWTH < 24 HOURS Performed at [facility], [address], [place], [place] [address]    Report Status PENDING  Incomplete  Blood Culture (routine x 2)     Status: None (Preliminary result)   Collection Time: 07/10/23  8:05 PM    Specimen: BLOOD RIGHT HAND  Result Value Ref Range Status   Specimen Description BLOOD RIGHT HAND  Final   Special Requests   Final    BOTTLES DRAWN AEROBIC AND ANAEROBIC Blood Culture results may not be optimal due to an inadequate volume of blood received in culture bottles   Culture   Final    NO GROWTH < 12 HOURS Performed at [facility], [address] [place], [place] [address]    Report Status PENDING  Incomplete  MRSA Next Gen by PCR, Nasal     Status: None   Collection Time: 07/10/23  8:35 PM   Specimen: Nasal Mucosa; Nasal Swab  Result Value Ref Range Status   MRSA by PCR Next Gen NOT DETECTED NOT DETECTED Final    Comment: (NOTE) The GeneXpert MRSA Assay (FDA approved for NASAL specimens only), is one component of a comprehensive MRSA colonization surveillance program. It is not intended to diagnose MRSA infection nor to guide or monitor treatment for MRSA infections. Test performance is not FDA approved in patients less than 86 years old. Performed at [facility], [address] [place], [place] [address]     Anti-infectives:  Anti-infectives (From admission, onward)    Start     Dose/Rate Route Frequency Ordered Stop   07/11/23 1500  vancomycin  (VANCOCIN ) IVPB 1000 mg/200 mL premix        1,000 mg 200 mL/hr over 60 Minutes Intravenous Every 24 hours 07/10/23 1626     07/11/23 1000  cefTRIAXone  (ROCEPHIN ) 2 g in sodium chloride  0.9 % 100 mL IVPB        2 g 200 mL/hr over 30 Minutes Intravenous Every  24 hours 07/10/23 1351     07/11/23 0800  vancomycin  variable dose per unstable renal function (pharmacist dosing)         Does not apply See admin instructions 07/10/23 1359     07/10/23 1400  metroNIDAZOLE  (FLAGYL ) IVPB 500 mg        500 mg 100 mL/hr over 60 Minutes Intravenous Every 12 hours 07/10/23 1351     07/10/23 1400  vancomycin  (VANCOCIN ) IVPB 1000 mg/200 mL premix        1,000 mg 200 mL/hr over 60 Minutes Intravenous  Once  07/10/23 1359 07/10/23 1518   07/10/23 0830  cefTRIAXone  (ROCEPHIN ) 2 g in sodium chloride  0.9 % 100 mL IVPB        2 g 200 mL/hr over 30 Minutes Intravenous Once 07/10/23 0818 07/10/23 0946   07/10/23 0830  vancomycin  (VANCOCIN ) IVPB 1000 mg/200 mL premix        1,000 mg 200 mL/hr over 60 Minutes Intravenous  Once 07/10/23 0818 07/10/23 1026        Consults:  Nephrology   PAST MEDICAL HISTORY   Past Medical History:  Diagnosis Date   Actinic keratosis    AICD (automatic cardioverter/defibrillator) present 03/31/2014   a. 03/2014 s/p SJM 2411-36C dual chamber AICD (serial Number [id number])- followed by Dr. Rodolfo Clan   Arthritis    a. L knee.   Asthma    Basal cell carcinoma 08/29/2008   Vertex scalp. Keratotic pattern, ulcerated.   Basal cell carcinoma 01/15/2018   Left distal medial thigh near knee. Fibroepithelioma of pinkus type   Basal cell carcinoma 01/15/2018   Right distal lat. nose ant. inferior edge. Nodular pattern.   Cardiac arrest (HCC)    a. 03/30/2014 VF arrest in setting of NICM >> CPR/defib in community >> s/p dual chamber AICD   Coronary artery disease, non-occlusive    a. cath 03/2014 at [facility]: no sig CAD (OM1 30%, CFX 30%), EF 35%   Dementia (HCC)    Dysplastic nevus 01/31/2020   L mid back paraspinal - severe, excision 03/21/2020   HLD (hyperlipidemia)    HTN (hypertension)    Kidney stones    Melanoma (HCC)    Melanoma resected from scalp   NICM (nonischemic cardiomyopathy) (HCC)    a. 03/2014 Echo:  Inferolateral and lateral HK, EF 50-55%, grade 1 diastolic dysfunction, mild MR, mild LAE, mild RAE, trivial TR, no effusion; 09/2018 Echo: EF 35-40%, impaired relaxation, glob HK, sev apical HK. Nl RV fxn. RVSP . Mildly dil LA.   Paroxysmal atrial fibrillation (HCC) 03/28/2015   Retinal artery occlusion      SURGICAL HISTORY   Past Surgical History:  Procedure Laterality Date   adenomatous polyps     CARDIAC CATHETERIZATION  03/30/2014    CARDIOVERSION  03/30/2014   COLONOSCOPY WITH PROPOFOL  N/A 03/24/2017   Procedure: COLONOSCOPY WITH PROPOFOL ;  Surgeon: Deveron Fly, MD;  Location: [facility] ENDOSCOPY;  Service: Endoscopy;  Laterality: N/A;   COLONOSCOPY WITH PROPOFOL  N/A 01/07/2019   Procedure: COLONOSCOPY WITH PROPOFOL ;  Surgeon: Toledo, Alphonsus Jeans, MD;  Location: [facility] ENDOSCOPY;  Service: Gastroenterology;  Laterality: N/A;   CYSTOSCOPY W/ LITHOLAPAXY / EHL     IMPLANTABLE CARDIOVERTER DEFIBRILLATOR IMPLANT N/A 03/31/2014   Procedure: IMPLANTABLE CARDIOVERTER DEFIBRILLATOR IMPLANT;  Surgeon: Tammie Fall, MD;  Location: [facility] CATH LAB;  Service: Cardiovascular;  Laterality: N/A;   JOINT REPLACEMENT     TONSILLECTOMY     "as a kid"   TOTAL KNEE ARTHROPLASTY Left ~  2010   TOTAL KNEE ARTHROPLASTY WITH REVISION COMPONENTS Left ~ 2011   VARICOSE VEIN SURGERY Left ~ 2014     FAMILY HISTORY   Family History  Problem Relation Age of Onset   Esophageal cancer Mother        died at 23   Heart attack Father        died at 104   Hypertension Brother    Colon polyps Brother      SOCIAL HISTORY   Social History   Tobacco Use   Smoking status: Never   Smokeless tobacco: Never  Vaping Use   Vaping status: Never Used  Substance Use Topics   Alcohol use: No   Drug use: No     MEDICATIONS   Current Medication:  Current Facility-Administered Medications:    0.9 %  sodium chloride  infusion, 250 mL, Intravenous, Continuous, Clemmie Marxen, MD, Last Rate: 10 mL/hr at 07/11/23 0000, Infusion Verify at 07/11/23 0000   cefTRIAXone  (ROCEPHIN ) 2 g in sodium chloride  0.9 % 100 mL IVPB, 2 g, Intravenous, Q24H, Braelynn Benning, MD, Stopped at 07/11/23 0981   Chlorhexidine  Gluconate Cloth 2 % PADS 6 each, 6 each, Topical, Daily, Rust-Chester, Britton L, NP   docusate sodium  (COLACE) capsule 100 mg, 100 mg, Oral, BID PRN, Xaivier Malay, MD   heparin  ADULT infusion 100 units/mL (25000 units/250mL), 900 Units/hr, Intravenous,  Continuous, Fareedah Mahler, MD, Last Rate: 9 mL/hr at 07/11/23 1000, 900 Units/hr at 07/11/23 1000   heparin  injection 1,000-6,000 Units, 1,000-6,000 Units, CRRT, PRN, Lateef, Munsoor, MD   hydrocortisone  sodium succinate  (SOLU-CORTEF ) 100 MG injection 100 mg, 100 mg, Intravenous, Q8H, Rust-Chester, Britton L, NP, 100 mg at 07/11/23 0438   lidocaine  (LIDODERM ) 5 % 1 patch, 1 patch, Transdermal, Q24H, Rust-Chester, Jenni Mody L, NP, 1 patch at 07/11/23 0436   metroNIDAZOLE  (FLAGYL ) IVPB 500 mg, 500 mg, Intravenous, Q12H, Willisha Sligar, MD, Last Rate: 100 mL/hr at 07/11/23 1000, Infusion Verify at 07/11/23 1000   norepinephrine  (LEVOPHED ) 16 mg in (0.064 mg/mL) premix infusion, 0-40 mcg/min, Intravenous, Titrated, Rust-Chester, Britton L, NP, Last Rate: 27.2 mL/hr at 07/11/23 1000, 29 mcg/min at 07/11/23 1000   Oral care mouth rinse, 15 mL, Mouth Rinse, 4 times per day, Rust-Chester, Britton L, NP, 15 mL at 07/11/23 0739   Oral care mouth rinse, 15 mL, Mouth Rinse, PRN, Rust-Chester, Jenni Mody L, NP, 15 mL at 07/11/23 0109   prismasol  BGK 4/2.5 infusion, , CRRT, Continuous, Lateef, Munsoor, MD, Last Rate: 400 mL/hr at 07/11/23 0808, New Bag at 07/11/23 0808   prismasol  BGK 4/2.5 infusion, , CRRT, Continuous, Lateef, Munsoor, MD, Last Rate: 400 mL/hr at 07/11/23 0808, New Bag at 07/11/23 0808   prismasol  BGK 4/2.5 infusion, , CRRT, Continuous, Lateef, Munsoor, MD, Last Rate: 2,000 mL/hr at 07/11/23 0848, New Bag at 07/11/23 0848   sodium chloride  flush (NS) 0.9 % injection 10-40 mL, 10-40 mL, Intracatheter, Q12H, Rust-Chester, Britton L, NP, 10 mL at 07/11/23 0755   sodium chloride  flush (NS) 0.9 % injection 10-40 mL, 10-40 mL, Intracatheter, PRN, Rust-Chester, Jenni Mody L, NP   vancomycin  (VANCOCIN ) IVPB 1000 mg/200 mL premix, 1,000 mg, Intravenous, Q24H, Greenwood, Howard F, [facility]   vancomycin  variable dose per unstable renal function (pharmacist dosing), , Does not apply, See admin instructions,  Ramonita Burow, RPH   vasopressin  (PITRESSIN) 20 Units in 100 mL (0.2 unit/mL) infusion-*FOR SHOCK*, 0-0.04 Units/min, Intravenous, Continuous, Rust-Chester, Britton L, NP, Last Rate: 12 mL/hr at 07/11/23 1020, 0.04 Units/min at 07/11/23 1020  Facility-Administered Medications Ordered in Other Encounters:    technetium tetrofosmin  (TC-MYOVIEW ) injection 23.3 millicurie, 23.3 millicurie, Intravenous, Once PRN, Elmyra Haggard, MD    ALLERGIES   Entresto  [sacubitril-valsartan]    REVIEW OF SYSTEMS     10 point review of systems is negative except for back pain  PHYSICAL EXAMINATION   Vital Signs: Temp:  [97.7 F (36.5 C)-98.8 F (37.1 C)] 98.8 F (37.1 C) (04/25 0700) Pulse Rate:  [48-67] 65 (04/25 0945) Resp:  [11-24] 21 (04/25 0945) BP: (75-112)/(38-83) 105/71 (04/25 0945) SpO2:  [93 %-100 %] 98 % (04/25 0945) Weight:  [98.8 kg-99.2 kg] 99.2 kg (04/25 0206)  GENERAL: Age appropriate slow to answer with mild encephalopathy HEAD: Normocephalic, atraumatic.  EYES: Pupils equal, round, reactive to light.  No scleral icterus.  MOUTH: Moist mucosal membrane. NECK: Supple. No thyromegaly. No nodules. No JVD.  PULMONARY: Crackles at the bases bilaterally CARDIOVASCULAR: S1 and S2. Regular rate and rhythm. No murmurs, rubs, or gallops.  GASTROINTESTINAL: Soft, nontender, non-distended. No masses. Positive bowel sounds. No hepatosplenomegaly.  MUSCULOSKELETAL: No swelling, clubbing, or edema.  NEUROLOGIC: Mild distress due to acute illness no focal deficits grossly SKIN: Cellulitic lesion of the dorsal aspect of the foot with surrounding erythema and bilateral pedal edema 2+   PERTINENT DATA     Infusions:  sodium chloride  10 mL/hr at 07/11/23 0000   cefTRIAXone  (ROCEPHIN )  IV Stopped (07/11/23 0924)   heparin  900 Units/hr (07/11/23 1000)   metronidazole  100 mL/hr at 07/11/23 1000   norepinephrine  (LEVOPHED ) Adult infusion 29 mcg/min (07/11/23 1000)   prismasol  BGK  4/2.5 400 mL/hr at 07/11/23 0808   prismasol  BGK 4/2.5 400 mL/hr at 07/11/23 1914   prismasol  BGK 4/2.5 2,000 mL/hr at 07/11/23 0848   vancomycin      vasopressin  0.04 Units/min (07/11/23 1020)   Scheduled Medications:  Chlorhexidine  Gluconate Cloth  6 each Topical Daily   hydrocortisone  sod succinate (SOLU-CORTEF ) inj  100 mg Intravenous Q8H   lidocaine   1 patch Transdermal Q24H   mouth rinse  15 mL Mouth Rinse 4 times per day   sodium chloride  flush  10-40 mL Intracatheter Q12H   vancomycin  variable dose per unstable renal function (pharmacist dosing)   Does not apply See admin instructions   PRN Medications: docusate sodium , heparin , mouth rinse, sodium chloride  flush Hemodynamic parameters:   Intake/Output: 04/24 0701 - 04/25 0700 In: 4810.9 [I.V.:2111; IV Piggyback:2699.9] Out: 1093 [Urine:730]  Ventilator  Settings:     LAB RESULTS:  Basic Metabolic Panel: Recent Labs  Lab 07/10/23 0850 07/10/23 1540 07/11/23 0016 07/11/23 0417  NA 134*  --  135 134*  K 5.4*  --  4.7 4.5  CL 100  --  104 104  CO2 20*  --  20* 19*  GLUCOSE 96  --  133* 124*  BUN 151*  --  89* 64*  CREATININE 5.58* 4.19* 2.71* 1.85*  CALCIUM  9.2  --  8.4* 8.3*  MG  --   --   --  2.2  PHOS  --   --   --  2.2*   Liver Function Tests: Recent Labs  Lab 07/10/23 0850 07/11/23 0417  AST 32  --   ALT 20  --   ALKPHOS 46  --   BILITOT 3.6*  --   PROT 6.5  --   ALBUMIN 3.5 3.0*   Recent Labs  Lab 07/10/23 1540  LIPASE 60*  AMYLASE 77   No results for input(s): "AMMONIA" in the last  168 hours. CBC: Recent Labs  Lab 07/10/23 0850 07/10/23 1540 07/11/23 0417  WBC 16.1* 15.6* 14.5*  NEUTROABS 14.1*  --   --   HGB 15.1 12.8* 13.9  HCT 44.3 36.6* 38.2*  MCV 93.9 91.7 89.7  PLT 256 234 215   Cardiac Enzymes: No results for input(s): "CKTOTAL", "CKMB", "CKMBINDEX", "TROPONINI" in the last 168 hours. BNP: Invalid input(s): "POCBNP" CBG: Recent Labs  Lab 07/10/23 1921  GLUCAP 125*        IMAGING RESULTS:     ASSESSMENT AND PLAN    -Multidisciplinary rounds held today  Septic shock -Present on admission with etiology appearing to be either left lower extremity cellulitis or intra-abdominal infection due to prolonged diarrhea -Empiric antibiotics with Cipro and Flagyl  -MRSA PCR -use vasopressors to keep MAP>65 -follow ABG and LA -follow up cultures -Blood and urine cultures -Central line access with CVP monitoring -consider stress dose steroids - Monitor lower extremity and consider surgical evaluation if demarcation worsens  Renal Failure-KDIGO stage 4 -Nephrology consultation- appreciate input - renal replacement therapy-starting to pull fluid  -follow chem 7 -follow UO -continue Foley Catheter-assess need daily   Chronic atrial fibrillation with non-ischemic cardiomyopathy - Status post ICD implantation  Altered mental status with encephalopathy Suspect toxic metabolic with overlying septic encephalopathy   ID -continue IV abx as prescibed -follow up cultures  GI/Nutrition GI PROPHYLAXIS as indicated DIET-->TF's as tolerated Constipation protocol as indicated  ENDO - ICU hypoglycemic\Hyperglycemia protocol -check FSBS per protocol   ELECTROLYTES -follow labs as needed -replace as needed -pharmacy consultation   DVT/GI PRX ordered -SCDs  TRANSFUSIONS AS NEEDED MONITOR FSBS ASSESS the need for LABS as needed    Critical care provider statement:   Total critical care time: 33 minutes   Performed by: Jaclynn Mast MD   Critical care time was exclusive of separately billable procedures and treating other patients.   Critical care was necessary to treat or prevent imminent or life-threatening deterioration.   Critical care was time spent personally by me on the following activities: development of treatment plan with patient and/or surrogate as well as nursing, discussions with consultants, evaluation of patient's response  to treatment, examination of patient, obtaining history from patient or surrogate, ordering and performing treatments and interventions, ordering and review of laboratory studies, ordering and review of radiographic studies, pulse oximetry and re-evaluation of patient's condition.    Ariadne Rissmiller, M.D.  Pulmonary & Critical Care Medicine

## 2023-07-11 NOTE — Progress Notes (Signed)
 PHARMACY - ANTICOAGULATION CONSULT NOTE  Pharmacy Consult for heparin  infusion Indication: atrial fibrillation  Allergies  Allergen Reactions   Entresto  [Sacubitril-Valsartan]     hallucinations    Patient Measurements: Height: 5\' 3"  (160 cm) Weight: 99.2 kg (218 lb 11.1 oz) IBW/kg (Calculated) : 56.9 HEPARIN  DW (KG): 77  Vital Signs: Temp: 97.7 F (36.5 C) (04/25 0600) Temp Source: Oral (04/25 0600) BP: 99/53 (04/25 0700) Pulse Rate: 62 (04/25 0700)  Labs: Recent Labs    07/10/23 0850 07/10/23 1039 07/10/23 1540 07/10/23 2005 07/11/23 0016 07/11/23 0417  HGB 15.1  --  12.8*  --   --  13.9  HCT 44.3  --  36.6*  --   --  38.2*  PLT 256  --  234  --   --  215  APTT  --   --   --   --  >200*  --   LABPROT 18.7*  --  19.9*  --   --   --   INR 1.5*  --  1.7*  --   --   --   CREATININE 5.58*  --  4.19*  --  2.71* 1.85*  TROPONINIHS 164* 195*  --  199* 173*  --     Estimated Creatinine Clearance: 33.2 mL/min (A) (by C-G formula based on SCr of 1.85 mg/dL (H)).   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 03/2014 s/p SJM 2411-36C dual chamber AICD (serial Number [id number])- followed by Dr. Rodolfo Clan   Arthritis    a. L knee.   Asthma    Basal cell carcinoma 08/29/2008   Vertex scalp. Keratotic pattern, ulcerated.   Basal cell carcinoma 01/15/2018   Left distal medial thigh near knee. Fibroepithelioma of pinkus type   Basal cell carcinoma 01/15/2018   Right distal lat. nose ant. inferior edge. Nodular pattern.   Cardiac arrest (HCC)    a. 03/30/2014 VF arrest in setting of NICM >> CPR/defib in community >> s/p dual chamber AICD   Coronary artery disease, non-occlusive    a. cath 03/2014 at [facility]: no sig CAD (OM1 30%, CFX 30%), EF 35%   Dementia (HCC)    Dysplastic nevus 01/31/2020   L mid back paraspinal - severe, excision 03/21/2020   HLD (hyperlipidemia)    HTN  (hypertension)    Kidney stones    Melanoma (HCC)    Melanoma resected from scalp   NICM (nonischemic cardiomyopathy) (HCC)    a. 03/2014 Echo:  Inferolateral and lateral HK, EF 50-55%, grade 1 diastolic dysfunction, mild MR, mild LAE, mild RAE, trivial TR, no effusion; 09/2018 Echo: EF 35-40%, impaired relaxation, glob HK, sev apical HK. Nl RV fxn. RVSP . Mildly dil LA.   Paroxysmal atrial fibrillation (HCC) 03/28/2015   Retinal artery occlusion     Medications:  Scheduled:   Chlorhexidine  Gluconate Cloth  6 each Topical Daily   hydrocortisone  sod succinate (SOLU-CORTEF ) inj  100 mg Intravenous Q8H   lidocaine   1 patch Transdermal Q24H   mouth rinse  15 mL Mouth Rinse 4 times per day   sodium chloride  flush  10-40 mL Intracatheter Q12H   vancomycin  variable dose per unstable renal function (pharmacist dosing)   Does not apply See admin instructions    Assessment: 78 year old male w/ PMH of HTN, nonischemic cardiomyopathy, atrial fibrillation,  ventricular tachycardia, HLD, dementia admitted with circulatory shock with  AKI. Noted to be on apixaban   PTA with last dose 07/08/23  Goal of Therapy:  Anti-Xa level 0.3-0.7 units/ml aPTT 66 - 102 seconds Monitor platelets by anticoagulation protocol: Yes   Plan: aPTT remains supratherapeutic despite recent rate adjustment - will hold heparin  drip for 1 hr and restart at 600 units/hr - will recheck aPTT 8 hrs after restart - will use aPTT to guide therapy until aPTT and Anti-Xa correlate ---Continue to monitor H&H and platelets  Ethan Vega 07/11/2023,7:10 AM

## 2023-07-11 NOTE — Consult Note (Signed)
 PHARMACY CONSULT NOTE - ELECTROLYTES  Pharmacy Consult for Electrolyte Monitoring and Replacement   Recent Labs: Height: 5\' 3"  (160 cm) Weight: 99.2 kg (218 lb 11.1 oz) IBW/kg (Calculated) : 56.9 Estimated Creatinine Clearance: 48.4 mL/min (A) (by C-G formula based on SCr of 1.27 mg/dL (H)). Potassium (mmol/L)  Date Value  07/11/2023 4.9  07/06/2014 4.2   Magnesium (mg/dL)  Date Value  82/95/6213 2.2   Calcium  (mg/dL)  Date Value  08/65/7846 7.5 (L)   Calcium , Total (mg/dL)  Date Value  96/29/5284 9.1   Albumin (g/dL)  Date Value  13/24/4010 3.0 (L)  01/15/2023 4.3  03/30/2014 3.5   Phosphorus (mg/dL)  Date Value  27/25/3664 2.0 (L)   Sodium (mmol/L)  Date Value  07/11/2023 137  06/26/2023 140  07/06/2014 141    Assessment  Ethan Demas. is a 78 y.o. male presenting with septic shock. PMH significant for HTN, cardiomyopathy, Afib, and HLD. Pharmacy has been consulted to monitor and replace electrolytes.  Goal of Therapy: Electrolytes WNL  Plan:  Phos 2.0, Give Kphos Neutral 500 mg po QID x 4 doses Check renal function panel BID while on CRRT   Thank you for allowing pharmacy to be a part of this patient's care.  Ramonita Burow, PharmD Clinical Pharmacist 07/11/2023 4:36 PM

## 2023-07-11 NOTE — Progress Notes (Signed)
 PHARMACY - ANTICOAGULATION CONSULT NOTE  Pharmacy Consult for heparin  infusion Indication: atrial fibrillation  Allergies  Allergen Reactions   Entresto  [Sacubitril-Valsartan]     hallucinations    Patient Measurements: Height: 5\' 3"  (160 cm) Weight: 98.8 kg (217 lb 13 oz) IBW/kg (Calculated) : 56.9 HEPARIN  DW (KG): 77  Vital Signs: Temp: 98.2 F (36.8 C) (04/25 0015) Temp Source: Oral (04/25 0015) BP: 98/57 (04/25 0130) Pulse Rate: 50 (04/25 0130)  Labs: Recent Labs    07/10/23 0850 07/10/23 1039 07/10/23 1540 07/10/23 2005 07/11/23 0016  HGB 15.1  --  12.8*  --   --   HCT 44.3  --  36.6*  --   --   PLT 256  --  234  --   --   APTT  --   --   --   --  >200*  LABPROT 18.7*  --  19.9*  --   --   INR 1.5*  --  1.7*  --   --   CREATININE 5.58*  --  4.19*  --  2.71*  TROPONINIHS 164* 195*  --  199* 173*    Estimated Creatinine Clearance: 22.7 mL/min (A) (by C-G formula based on SCr of 2.71 mg/dL (H)).   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 03/2014 s/p SJM 2411-36C dual chamber AICD (serial Number [id number])- followed by Dr. Rodolfo Clan   Arthritis    a. L knee.   Asthma    Basal cell carcinoma 08/29/2008   Vertex scalp. Keratotic pattern, ulcerated.   Basal cell carcinoma 01/15/2018   Left distal medial thigh near knee. Fibroepithelioma of pinkus type   Basal cell carcinoma 01/15/2018   Right distal lat. nose ant. inferior edge. Nodular pattern.   Cardiac arrest (HCC)    a. 03/30/2014 VF arrest in setting of NICM >> CPR/defib in community >> s/p dual chamber AICD   Coronary artery disease, non-occlusive    a. cath 03/2014 at [facility]: no sig CAD (OM1 30%, CFX 30%), EF 35%   Dementia (HCC)    Dysplastic nevus 01/31/2020   L mid back paraspinal - severe, excision 03/21/2020   HLD (hyperlipidemia)    HTN (hypertension)    Kidney stones    Melanoma (HCC)     Melanoma resected from scalp   NICM (nonischemic cardiomyopathy) (HCC)    a. 03/2014 Echo:  Inferolateral and lateral HK, EF 50-55%, grade 1 diastolic dysfunction, mild MR, mild LAE, mild RAE, trivial TR, no effusion; 09/2018 Echo: EF 35-40%, impaired relaxation, glob HK, sev apical HK. Nl RV fxn. RVSP . Mildly dil LA.   Paroxysmal atrial fibrillation (HCC) 03/28/2015   Retinal artery occlusion     Medications:  Scheduled:   mouth rinse  15 mL Mouth Rinse 4 times per day   vancomycin  variable dose per unstable renal function (pharmacist dosing)   Does not apply See admin instructions    Assessment: 78 year old male w/ PMH of HTN, nonischemic cardiomyopathy, atrial fibrillation,  ventricular tachycardia, HLD, dementia admitted with circulatory shock with  AKI. Noted to be on apixaban  PTA with last dose 07/08/23  Goal of Therapy:  Anti-Xa level 0.3-0.7 units/ml aPTT 66 - 102 seconds Monitor platelets by anticoagulation protocol: Yes   Plan:  4/25:  aPTT @ 0016 = > 200  - RN stated sample was drawn from opposite arm as infusion - will hold heparin  drip for 1 hr and  restart at 900 units/hr - will recheck aPTT and HL 8 hrs after restart - will use aPTT to guide therapy until aPTT and Anti-Xa correlate ---Continue to monitor H&H and platelets  Tyriq Moragne D 07/11/2023,1:46 AM

## 2023-07-11 NOTE — Consult Note (Addendum)
 WOC Nurse Consult Note: Reason for Consult: Consult requested for sacrum, left leg, left foot. Performed remotely after review of progress notes and photos in the EMR.  Sacrum and bilat buttocks is red, moist and macerated; appearance is consistent with moisture associated skin damage.  There are patchy areas of full thickness wounds scattered near the rectum and inner gluteal fold, yellow and moist.These are not pressure injuries, but are related to moisture.  Left anterior foot with full thickness wound, dark red and dry Left lower anterior calf with scattered areas of full thickness wounds, yellow and dry with some brown scabbed areas.   Pt is critically ill with multiple systemic factors which can impair healing. He is on a low airloss mattress to reduce pressure.  Dressing procedure/placement/frequency: Topical treatment ordes provided for bedside nurses to perform as follows to protect skin and repel moisture and assist with removal of nonviable tissue:  1. Apply Desitin to sacrum and bilat buttocks BID and PRN when turning or cleaning 2. Apply Medihoney to left anterior foot and left leg Q day, then cover with foam dressing. Change foam dressing Q 3 days or PRN soiling. Please re-consult if further assistance is needed.  Thank-you,  Wiliam Harder MSN, RN, CWOCN, [place], CNS [phone]

## 2023-07-11 NOTE — Progress Notes (Signed)
 Initial Nutrition Assessment  DOCUMENTATION CODES:   Obesity unspecified  INTERVENTION:   Ensure Enlive po TID, each supplement provides 350 kcal and 20 grams of protein.  Magic cup TID with meals, each supplement provides 290 kcal and 9 grams of protein  Rena-vit po daily   Vitamin C 500mg  po BID  Thiamine  100mg  po daily x 7 days  Mechanical soft diet   Pt at high refeed risk; recommend monitor potassium, magnesium and phosphorus labs daily until stable  Daily weights   NUTRITION DIAGNOSIS:   Inadequate oral intake related to acute illness as evidenced by per patient/family report.  GOAL:   Patient will meet greater than or equal to 90% of their needs  MONITOR:   PO intake, Supplement acceptance, Labs, Weight trends, I & O's, Skin  REASON FOR ASSESSMENT:   Consult Assessment of nutrition requirement/status  ASSESSMENT:   78 y/o male with h/o dementia, CAD, cardiac arrest s/p AICD, HTN, HLD, PAF, chronic wounds and CKD who is admitted with septic shock, AKI and AMS.  Met with pt in room today; pt with some confusion. Pt reports decreased appetite and oral intake for the past two years r/t taste changes. Pt reports that he has not eaten much over the past few days. Per chart, family reported pt with poor oral intake for the past two weeks r/t diarrhea, intermittent vomiting and weakness. Pt initiated on a diet this morning. RD discussed with pt the importance of adequate nutrition needed to preserve lean muscle. Pt is agreeable to drinking Ensure supplements. RD will add supplements and vitamins to help pt meet his estimated needs and to support wound healing. Pt is actively refeeding; electrolytes being monitored and supplemented. Per chart, pt is appears to be down ~13lbs(6%) since February. Pt is currently up ~18lbs since admission. Pt +3.7L on his I & Os. Pt s/p CRRT initiation yesterday.  Medications reviewed and include: solu-cortef , vancomycin , ceftriaxone ,  heparin , metronidazole , levophed , vasopressin    Labs reviewed: Na 134(L), K 4.5 wnl, BUN 64(H), creat 1.85(H), P 2.2(L), Mg 2.2 wnl BNP- 1005(H)- 4/24 Wbc- 14.5(H) AIC 5.8(H)- 09/2022  NUTRITION - FOCUSED PHYSICAL EXAM:  Flowsheet Row Most Recent Value  Orbital Region No depletion  Upper Arm Region No depletion  Thoracic and Lumbar Region No depletion  Buccal Region No depletion  Temple Region No depletion  Clavicle Bone Region No depletion  Clavicle and Acromion Bone Region No depletion  Scapular Bone Region No depletion  Dorsal Hand No depletion  Patellar Region No depletion  Anterior Thigh Region No depletion  Posterior Calf Region No depletion  Edema (RD Assessment) Moderate  Hair Reviewed  Eyes Reviewed  Mouth Reviewed  Skin Reviewed  Nails Reviewed   Diet Order:   Diet Order             DIET DYS 3 Fluid consistency: Thin; Fluid restriction: 2000 mL Fluid  Diet effective now                  EDUCATION NEEDS:   Education needs have been addressed  Skin:  Skin Assessment: Reviewed RN Assessment (full thickness wounds scattered near the rectum and inner gluteal fold, MASD sacrum and buttocks, left anterior foot with full thickness wound, Left lower anterior calf with scattered areas of full thickness wounds)  Last BM:  4/24- type 7  Height:   Ht Readings from Last 1 Encounters:  07/10/23 5\' 3"  (1.6 m)    Weight:   Wt Readings from Last 1  Encounters:  07/11/23 99.2 kg    Ideal Body Weight:  56.3 kg  BMI:  Body mass index is 38.74 kg/m.  Estimated Nutritional Needs:   Kcal:  2000-2300kcal/day  Protein:  100-115g/day  Fluid:  1.5-1.7L/day  Torrance Freestone MS, RD, LDN If unable to be reached, please send secure chat to "RD inpatient" available from 8:00a-4:00p daily

## 2023-07-12 ENCOUNTER — Inpatient Hospital Stay

## 2023-07-12 DIAGNOSIS — Z711 Person with feared health complaint in whom no diagnosis is made: Secondary | ICD-10-CM

## 2023-07-12 DIAGNOSIS — L03116 Cellulitis of left lower limb: Secondary | ICD-10-CM

## 2023-07-12 DIAGNOSIS — N179 Acute kidney failure, unspecified: Secondary | ICD-10-CM

## 2023-07-12 DIAGNOSIS — Z7189 Other specified counseling: Secondary | ICD-10-CM

## 2023-07-12 DIAGNOSIS — Z515 Encounter for palliative care: Secondary | ICD-10-CM | POA: Diagnosis not present

## 2023-07-12 DIAGNOSIS — Z66 Do not resuscitate: Secondary | ICD-10-CM | POA: Diagnosis not present

## 2023-07-12 DIAGNOSIS — N189 Chronic kidney disease, unspecified: Secondary | ICD-10-CM

## 2023-07-12 LAB — RENAL FUNCTION PANEL
Albumin: 2.8 g/dL — ABNORMAL LOW (ref 3.5–5.0)
Albumin: 2.8 g/dL — ABNORMAL LOW (ref 3.5–5.0)
Anion gap: 10 (ref 5–15)
Anion gap: 13 (ref 5–15)
BUN: 20 mg/dL (ref 8–23)
BUN: 15 mg/dL (ref 8–23)
CO2: 22 mmol/L (ref 22–32)
CO2: 21 mmol/L — ABNORMAL LOW (ref 22–32)
Calcium: 7.9 mg/dL — ABNORMAL LOW (ref 8.9–10.3)
Calcium: 8 mg/dL — ABNORMAL LOW (ref 8.9–10.3)
Chloride: 103 mmol/L (ref 98–111)
Chloride: 101 mmol/L (ref 98–111)
Creatinine, Ser: 1.18 mg/dL (ref 0.61–1.24)
Creatinine, Ser: 1.02 mg/dL (ref 0.61–1.24)
GFR, Estimated: >60 mL/min (ref >60)
GFR, Estimated: >60 mL/min (ref >60)
Glucose, Bld: 158 mg/dL — ABNORMAL HIGH (ref 70–99)
Glucose, Bld: 146 mg/dL — ABNORMAL HIGH (ref 70–99)
Phosphorus: 2.7 mg/dL (ref 2.5–4.6)
Phosphorus: 2.7 mg/dL (ref 2.5–4.6)
Potassium: 4.1 mmol/L (ref 3.5–5.1)
Potassium: 4.2 mmol/L (ref 3.5–5.1)
Sodium: 135 mmol/L (ref 135–145)
Sodium: 135 mmol/L (ref 135–145)

## 2023-07-12 LAB — CBC
HCT: 40 % (ref 39.0–52.0)
Hemoglobin: 14 g/dL (ref 13.0–17.0)
MCH: 32.7 pg (ref 26.0–34.0)
MCHC: 35 g/dL (ref 30.0–36.0)
MCV: 93.5 fL (ref 80.0–100.0)
Platelets: 226 10*3/uL (ref 150–400)
RBC: 4.28 MIL/uL (ref 4.22–5.81)
RDW: 14.8 % (ref 11.5–15.5)
WBC: 17.4 10*3/uL — ABNORMAL HIGH (ref 4.0–10.5)
nRBC: 0 % (ref 0.0–0.2)

## 2023-07-12 LAB — GLUCOSE, CAPILLARY: Glucose-Capillary: 129 mg/dL — ABNORMAL HIGH (ref 70–99)

## 2023-07-12 LAB — HEPARIN LEVEL (UNFRACTIONATED): Heparin Unfractionated: 0.66 [IU]/mL (ref 0.30–0.70)

## 2023-07-12 LAB — BLOOD GAS, ARTERIAL
Acid-base deficit: 1.8 mmol/L (ref 0.0–2.0)
Bicarbonate: 16.7 mmol/L — ABNORMAL LOW (ref 20.0–28.0)
O2 Content: 2 L/min
O2 Saturation: 99.8 %
Patient temperature: 37
pCO2 arterial: <18 mmHg — CL (ref 32–48)
pH, Arterial: 7.6 — ABNORMAL HIGH (ref 7.35–7.45)
pO2, Arterial: 156 mmHg — ABNORMAL HIGH (ref 83–108)

## 2023-07-12 LAB — HEPATITIS B SURFACE ANTIBODY, QUANTITATIVE: Hep B S AB Quant (Post): <3.5 m[IU]/mL — ABNORMAL LOW

## 2023-07-12 LAB — APTT
aPTT: 58 s — ABNORMAL HIGH (ref 24–36)
aPTT: 70 s — ABNORMAL HIGH (ref 24–36)
aPTT: 80 s — ABNORMAL HIGH (ref 24–36)

## 2023-07-12 LAB — HEPATITIS B CORE ANTIBODY, TOTAL: HEP B CORE AB: NEGATIVE

## 2023-07-12 LAB — MAGNESIUM: Magnesium: 2.4 mg/dL (ref 1.7–2.4)

## 2023-07-12 MED ORDER — HEPARIN BOLUS VIA INFUSION
1200.0000 [IU] | Freq: Once | INTRAVENOUS | Status: AC
  Administered 2023-07-12: 1200 [IU] via INTRAVENOUS
  Filled 2023-07-12: qty 1200

## 2023-07-12 MED ORDER — PIPERACILLIN-TAZOBACTAM 3.375 G IVPB 30 MIN
3.3750 g | Freq: Four times a day (QID) | INTRAVENOUS | Status: AC
  Administered 2023-07-12 – 2023-07-15 (×14): 3.375 g via INTRAVENOUS
  Filled 2023-07-12 (×26): qty 50

## 2023-07-12 MED ORDER — MORPHINE SULFATE (PF) 2 MG/ML IV SOLN
2.0000 mg | INTRAVENOUS | Status: AC
  Administered 2023-07-12: 2 mg via INTRAVENOUS
  Filled 2023-07-12: qty 1

## 2023-07-12 NOTE — Progress Notes (Signed)
 [facility]  ROUNDING NOTE   Subjective:     Patient remains oliguric at this time. Daughter at bedside. Creatinine down to 1.1. 04/25 0701 - 04/26 0700 In: 1768.1 [I.V.:1168.1; IV Piggyback:600] Out: 2966 [Urine:265] Lab Results  Component Value Date   CREATININE 1.18 07/12/2023   CREATININE 1.27 (H) 07/11/2023   CREATININE 1.85 (H) 07/11/2023     Objective:  Vital signs in last 24 hours:  Temp:  [98.3 F (36.8 C)-99 F (37.2 C)] 98.7 F (37.1 C) (04/26 0730) Pulse Rate:  [42-113] 75 (04/26 0800) Resp:  [15-40] 24 (04/26 0800) BP: (80-136)/(39-110) 113/77 (04/26 0800) SpO2:  [91 %-100 %] 98 % (04/26 0800) Weight:  [99.7 kg] 99.7 kg (04/26 0407)  Weight change: 9.1 kg Filed Weights   07/10/23 1925 07/11/23 0206 07/12/23 0407  Weight: 98.8 kg 99.2 kg 99.7 kg    Intake/Output: I/O last 3 completed shifts: In: 2329 [I.V.:1629.1; IV Piggyback:699.9] Out: 3884 [Urine:820]   Intake/Output this shift:  Total I/O In: 46.1 [I.V.:46.1] Out: 94   Physical Exam: General: NAD  Head: Normocephalic, atraumatic. Moist oral mucosal membranes  Eyes: Anicteric  Lungs:  Clear to auscultation, normal effort  Heart: Regular rate and rhythm  Abdomen:  Soft, nontender  Extremities: 1+ peripheral edema.  Neurologic: Nonfocal, moving all four extremities  Skin: No lesions  Access: Rt femoral HD temp cath    Basic Metabolic Panel: Recent Labs  Lab 07/10/23 0850 07/10/23 1540 07/11/23 0016 07/11/23 0417 07/11/23 1535 07/12/23 0357  NA 134*  --  135 134* 137 135  K 5.4*  --  4.7 4.5 4.9 4.1  CL 100  --  104 104 102 103  CO2 20*  --  20* 19* 22 22  GLUCOSE 96  --  133* 124* 178* 158*  BUN 151*  --  89* 64* 33* 20  CREATININE 5.58* 4.19* 2.71* 1.85* 1.27* 1.18  CALCIUM  9.2  --  8.4* 8.3* 7.5* 7.9*  MG  --   --   --  2.2  --  2.4  PHOS  --   --   --  2.2* 2.0* 2.7    Liver Function Tests: Recent Labs  Lab 07/10/23 0850 07/11/23 0417 07/11/23 1535  07/12/23 0357  AST 32  --   --   --   ALT 20  --   --   --   ALKPHOS 46  --   --   --   BILITOT 3.6*  --   --   --   PROT 6.5  --   --   --   ALBUMIN 3.5 3.0* 3.0* 2.8*   Recent Labs  Lab 07/10/23 1540  LIPASE 60*  AMYLASE 77   No results for input(s): "AMMONIA" in the last 168 hours.  CBC: Recent Labs  Lab 07/10/23 0850 07/10/23 1540 07/11/23 0417 07/12/23 0357  WBC 16.1* 15.6* 14.5* 17.4*  NEUTROABS 14.1*  --   --   --   HGB 15.1 12.8* 13.9 14.0  HCT 44.3 36.6* 38.2* 40.0  MCV 93.9 91.7 89.7 93.5  PLT 256 234 215 226    Cardiac Enzymes: No results for input(s): "CKTOTAL", "CKMB", "CKMBINDEX", "TROPONINI" in the last 168 hours.  BNP: Invalid input(s): "POCBNP"  CBG: Recent Labs  Lab 07/10/23 1921 07/11/23 2239  GLUCAP 125* 145*    Microbiology: Results for orders placed or performed during the hospital encounter of 07/10/23  Resp panel by RT-PCR (RSV, Flu A&B, Covid) Anterior Nasal Swab  Status: None   Collection Time: 07/10/23  8:50 AM   Specimen: Anterior Nasal Swab  Result Value Ref Range Status   SARS Coronavirus 2 by RT PCR NEGATIVE NEGATIVE Final    Comment: (NOTE) SARS-CoV-2 target nucleic acids are NOT DETECTED.  The SARS-CoV-2 RNA is generally detectable in upper respiratory specimens during the acute phase of infection. The lowest concentration of SARS-CoV-2 viral copies this assay can detect is 138 copies/mL. A negative result does not preclude SARS-Cov-2 infection and should not be used as the sole basis for treatment or other patient management decisions. A negative result may occur with  improper specimen collection/handling, submission of specimen other than nasopharyngeal swab, presence of viral mutation(s) within the areas targeted by this assay, and inadequate number of viral copies(<138 copies/mL). A negative result must be combined with clinical observations, patient history, and epidemiological information. The expected  result is Negative.  Fact Sheet for Patients:  BloggerCourse.com  Fact Sheet for Healthcare Providers:  SeriousBroker.it  This test is no t yet approved or cleared by the [place]  FDA and  has been authorized for detection and/or diagnosis of SARS-CoV-2 by FDA under an Emergency Use Authorization (EUA). This EUA will remain  in effect (meaning this test can be used) for the duration of the COVID-19 declaration under Section 564(b)(1) of the Act, 21 U.S.C.section 360bbb-3(b)(1), unless the authorization is terminated  or revoked sooner.       Influenza A by PCR NEGATIVE NEGATIVE Final   Influenza B by PCR NEGATIVE NEGATIVE Final    Comment: (NOTE) The Xpert Xpress SARS-CoV-2/FLU/RSV plus assay is intended as an aid in the diagnosis of influenza from Nasopharyngeal swab specimens and should not be used as a sole basis for treatment. Nasal washings and aspirates are unacceptable for Xpert Xpress SARS-CoV-2/FLU/RSV testing.  Fact Sheet for Patients: BloggerCourse.com  Fact Sheet for Healthcare Providers: SeriousBroker.it  This test is not yet approved or cleared by the [place]  FDA and has been authorized for detection and/or diagnosis of SARS-CoV-2 by FDA under an Emergency Use Authorization (EUA). This EUA will remain in effect (meaning this test can be used) for the duration of the COVID-19 declaration under Section 564(b)(1) of the Act, 21 U.S.C. section 360bbb-3(b)(1), unless the authorization is terminated or revoked.     Resp Syncytial Virus by PCR NEGATIVE NEGATIVE Final    Comment: (NOTE) Fact Sheet for Patients: BloggerCourse.com  Fact Sheet for Healthcare Providers: SeriousBroker.it  This test is not yet approved or cleared by the [place]  FDA and has been authorized for detection and/or diagnosis of  SARS-CoV-2 by FDA under an Emergency Use Authorization (EUA). This EUA will remain in effect (meaning this test can be used) for the duration of the COVID-19 declaration under Section 564(b)(1) of the Act, 21 U.S.C. section 360bbb-3(b)(1), unless the authorization is terminated or revoked.  Performed at [facility], [address] [place], [place] [address]   Blood Culture (routine x 2)     Status: None (Preliminary result)   Collection Time: 07/10/23  8:50 AM   Specimen: BLOOD  Result Value Ref Range Status   Specimen Description BLOOD RIGHT [facility]  Final   Special Requests   Final    BOTTLES DRAWN AEROBIC AND ANAEROBIC Blood Culture results may not be optimal due to an inadequate volume of blood received in culture bottles   Culture   Final    NO GROWTH 2 DAYS Performed at [facility], [address]  [place], [place] [address]    Report Status PENDING  Incomplete  Blood Culture (routine x 2)     Status: None (Preliminary result)   Collection Time: 07/10/23  8:05 PM   Specimen: BLOOD RIGHT HAND  Result Value Ref Range Status   Specimen Description BLOOD RIGHT HAND  Final   Special Requests   Final    BOTTLES DRAWN AEROBIC AND ANAEROBIC Blood Culture results may not be optimal due to an inadequate volume of blood received in culture bottles   Culture   Final    NO GROWTH 2 DAYS Performed at [facility], [address] [place], [place] [address]    Report Status PENDING  Incomplete  MRSA Next Gen by PCR, Nasal     Status: None   Collection Time: 07/10/23  8:35 PM   Specimen: Nasal Mucosa; Nasal Swab  Result Value Ref Range Status   MRSA by PCR Next Gen NOT DETECTED NOT DETECTED Final    Comment: (NOTE) The GeneXpert MRSA Assay (FDA approved for NASAL specimens only), is one component of a comprehensive MRSA colonization surveillance program. It is not intended to diagnose MRSA infection nor to guide or monitor treatment for MRSA  infections. Test performance is not FDA approved in patients less than 64 years old. Performed at [facility], [address] [place], [place] [address]     Coagulation Studies: Recent Labs    07/10/23 2956 07/10/23 1540  LABPROT 18.7* 19.9*  INR 1.5* 1.7*    Urinalysis: Recent Labs    07/10/23 1120 07/10/23 1623  COLORURINE YELLOW* YELLOW*  LABSPEC 1.016 1.012  PHURINE 5.0 5.0  GLUCOSEU NEGATIVE >=500*  HGBUR NEGATIVE SMALL*  BILIRUBINUR NEGATIVE NEGATIVE  KETONESUR NEGATIVE 5*  PROTEINUR NEGATIVE NEGATIVE  NITRITE NEGATIVE NEGATIVE  LEUKOCYTESUR NEGATIVE NEGATIVE      Imaging: CT FOOT LEFT WO CONTRAST Result Date: 07/10/2023 CLINICAL DATA:  Left leg and foot wound.  Concern for osteomyelitis. EXAM: CT OF THE LOWER LEFT EXTREMITY AND FOOT WITHOUT CONTRAST TECHNIQUE: Multidetector CT imaging of the lower left extremity was performed according to the standard protocol. RADIATION DOSE REDUCTION: This exam was performed according to the departmental dose-optimization program which includes automated exposure control, adjustment of the mA and/or kV according to patient size and/or use of iterative reconstruction technique. COMPARISON:  None Available. FINDINGS: Bones/Joint/Cartilage Status post left total knee arthroplasty. Hardware is intact with appropriate alignment. No acute fracture or dislocation. No evidence of acute osteolysis or erosive changes. Mild degenerative changes of the midfoot and forefoot. Calcaneal enthesopathy at the origin of the central cord of the plantar fascia in the insertion of the Achilles tendon. Ligaments Ligaments are suboptimally evaluated by CT. Muscles and Tendons No intramuscular fluid collection. Soft tissue Diffuse subcutaneous edema extending along the mid to distal left lower extremity through the forefoot. Cutaneous irregularity along the dorsal forefoot likely represents a wound. No loculated fluid collection. No soft tissue gas.  IMPRESSION: 1. Diffuse subcutaneous edema extending along the mid to distal left lower extremity through the forefoot, concerning for cellulitis. Cutaneous irregularity along the dorsal forefoot likely represents a wound. No loculated fluid collection. 2. No acute osseous abnormality. No evidence of acute osteolysis or erosive changes. 3. Intact left total knee arthroplasty. 4. Mild degenerative changes of the midfoot and forefoot. Electronically Signed   By: Mannie Seek M.D.   On: 07/10/2023 20:27   CT TIBIA FIBULA LEFT WO CONTRAST Result Date: 07/10/2023 CLINICAL DATA:  Left leg and foot  wound.  Concern for osteomyelitis. EXAM: CT OF THE LOWER LEFT EXTREMITY AND FOOT WITHOUT CONTRAST TECHNIQUE: Multidetector CT imaging of the lower left extremity was performed according to the standard protocol. RADIATION DOSE REDUCTION: This exam was performed according to the departmental dose-optimization program which includes automated exposure control, adjustment of the mA and/or kV according to patient size and/or use of iterative reconstruction technique. COMPARISON:  None Available. FINDINGS: Bones/Joint/Cartilage Status post left total knee arthroplasty. Hardware is intact with appropriate alignment. No acute fracture or dislocation. No evidence of acute osteolysis or erosive changes. Mild degenerative changes of the midfoot and forefoot. Calcaneal enthesopathy at the origin of the central cord of the plantar fascia in the insertion of the Achilles tendon. Ligaments Ligaments are suboptimally evaluated by CT. Muscles and Tendons No intramuscular fluid collection. Soft tissue Diffuse subcutaneous edema extending along the mid to distal left lower extremity through the forefoot. Cutaneous irregularity along the dorsal forefoot likely represents a wound. No loculated fluid collection. No soft tissue gas. IMPRESSION: 1. Diffuse subcutaneous edema extending along the mid to distal left lower extremity through the  forefoot, concerning for cellulitis. Cutaneous irregularity along the dorsal forefoot likely represents a wound. No loculated fluid collection. 2. No acute osseous abnormality. No evidence of acute osteolysis or erosive changes. 3. Intact left total knee arthroplasty. 4. Mild degenerative changes of the midfoot and forefoot. Electronically Signed   By: Mannie Seek M.D.   On: 07/10/2023 20:27   CT ABDOMEN PELVIS WO CONTRAST Result Date: 07/10/2023 CLINICAL DATA:  Abdominal pain and intermittent vomiting.  Sepsis. EXAM: CT ABDOMEN AND PELVIS WITHOUT CONTRAST TECHNIQUE: Multidetector CT imaging of the abdomen and pelvis was performed following the standard protocol without IV contrast. RADIATION DOSE REDUCTION: This exam was performed according to the departmental dose-optimization program which includes automated exposure control, adjustment of the mA and/or kV according to patient size and/or use of iterative reconstruction technique. COMPARISON:  None Available. FINDINGS: Evaluation of this exam is limited in the absence of intravenous contrast. Lower chest: The visualized lung bases are clear. There is coronary vascular calcification. Pacemaker wires noted. No intra-abdominal free air or free fluid. Hepatobiliary: The liver is unremarkable. No biliary ductal dilatation. Gallstone. No pericholecystic fluid or evidence of acute cholecystitis by CT. Pancreas: Unremarkable. No pancreatic ductal dilatation or surrounding inflammatory changes. Spleen: Normal in size without focal abnormality. Adrenals/Urinary Tract: The adrenal glands unremarkable. Nonobstructing right renal calculi measure up to 3-4 mm. No hydronephrosis. There is no hydronephrosis or nephrolithiasis on the left. Small bilateral renal cysts. Nonspecific bilateral perinephric stranding. Correlation with urinalysis recommended to exclude UTI. The visualized ureters and urinary bladder appear unremarkable. Stomach/Bowel: There is no bowel  obstruction or active inflammation. The appendix extends into the right inguinal canal and appears unremarkable. Vascular/Lymphatic: Mild aortoiliac atherosclerotic disease. The IVC is unremarkable. No portal venous gas. There is no adenopathy. Reproductive: The prostate and seminal vesicles are grossly unremarkable. No pelvic mass. Other: Small fat containing bilateral inguinal hernias. The right inguinal hernia contains the appendix. Musculoskeletal: Osteopenia with degenerative changes. No acute osseous pathology. IMPRESSION: 1. No acute intra-abdominal or pelvic pathology. 2. Nonobstructing right renal calculi. No hydronephrosis. 3. Cholelithiasis. 4.  Aortic Atherosclerosis (ICD10-I70.0). Electronically Signed   By: Angus Bark M.D.   On: 07/10/2023 11:51   DG Chest Port 1 View Result Date: 07/10/2023 CLINICAL DATA:  Questionable sepsis EXAM: PORTABLE CHEST 1 VIEW COMPARISON:  07/30/2017 FINDINGS: Dual-chamber pacer leads from the left in stable position. Chronic cardiopericardial  enlargement. Extensive artifact from EKG leads. There is no edema, consolidation, effusion, or pneumothorax. IMPRESSION: No evidence of active disease. Electronically Signed   By: Ronnette Coke M.D.   On: 07/10/2023 08:45     Medications:    cefTRIAXone  (ROCEPHIN )  IV Stopped (07/11/23 0981)   heparin  600 Units/hr (07/12/23 0800)   metronidazole  Stopped (07/11/23 2224)   norepinephrine  (LEVOPHED ) Adult infusion 30 mcg/min (07/12/23 0800)   prismasol  BGK 4/2.5 400 mL/hr at 07/11/23 2100   prismasol  BGK 4/2.5 400 mL/hr at 07/11/23 2100   prismasol  BGK 4/2.5 2,000 mL/hr at 07/12/23 0443   vancomycin  Stopped (07/11/23 1719)   vasopressin  0.04 Units/min (07/12/23 0800)    vitamin C  500 mg Oral BID   Chlorhexidine  Gluconate Cloth  6 each Topical Daily   feeding supplement  237 mL Oral TID BM   hydrocortisone  sod succinate (SOLU-CORTEF ) inj  100 mg Intravenous Q8H   leptospermum manuka honey  1 Application  Topical Daily   lidocaine   1 patch Transdermal Q24H   liver oil-zinc  oxide   Topical BID   multivitamin  1 tablet Oral QHS   mouth rinse  15 mL Mouth Rinse 4 times per day   phosphorus  500 mg Oral QID   pneumococcal 20-valent conjugate vaccine  0.5 mL Intramuscular Tomorrow-1000   QUEtiapine   25 mg Oral QHS   sodium chloride  flush  10-40 mL Intracatheter Q12H   thiamine   100 mg Oral Daily   docusate sodium , heparin , mouth rinse, sodium chloride  flush  Assessment/ Plan:  Mr. Ethan Vega. is a 78 y.o.  male  with a PMHx of hypertension, nonischemic cardiomyopathy, paroxysmal atrial fibrillation, history of ventricular tachycardia, hyperlipidemia, history of AICD placement, history of skin cancer, dementia, who was admitted to [facility] on 07/10/2023 for Cellulitis of left foot [L03.116] Septic shock (HCC) [A41.9, R65.21] Sepsis with acute renal failure and septic shock, due to unspecified organism, unspecified acute renal failure type (HCC) [A41.9, R65.21, N17.9] Acute renal failure superimposed on chronic kidney disease, unspecified acute renal failure type, unspecified CKD stage (HCC) [N17.9, N18.9]   Acute kidney injury/chronic kidney disease stage IIIb baseline EGFR 39 on 06/26/23/hyperkalemia.  Patient now with acute kidney injury secondary to ATN most likely.  Patient has had rather poor p.o. intake at home over the past several days prior to admission according to the daughter.  CT scan abdomen pelvis negative for obstruction.  Given severity of acute kidney injury recommend initiation of continuous renal placement therapy.  Appreciate critical care placing right femoral temporary dialysis catheter.  Patient remains oliguric at this time.  Therefore we will maintain the patient on CRRT with current UF target.  Monitor serum electrolytes.  04/25 0701 - 04/26 0700 In: 1768.1 [I.V.:1168.1; IV Piggyback:600] Out: 2966 [Urine:265] Lab Results  Component Value Date   CREATININE 1.18  07/12/2023   CREATININE 1.27 (H) 07/11/2023   CREATININE 1.85 (H) 07/11/2023      2.  Acute metabolic acidosis.  Serum bicarbonate improved significantly up to 22.  3.  Hypotension.  Patient maintained on vasopressin  and norepinephrine .    LOS: 2 Ethan Vega 4/26/20258:21 AM

## 2023-07-12 NOTE — Plan of Care (Signed)

## 2023-07-12 NOTE — Progress Notes (Signed)
 PHARMACY - ANTICOAGULATION CONSULT NOTE  Pharmacy Consult for heparin  infusion Indication: atrial fibrillation  Allergies  Allergen Reactions   Entresto  [Sacubitril-Valsartan]     hallucinations    Patient Measurements: Height: 5\' 3"  (160 cm) Weight: 99.2 kg (218 lb 11.1 oz) IBW/kg (Calculated) : 56.9 HEPARIN  DW (KG): 77  Vital Signs: Temp: 98.3 F (36.8 C) (04/26 0000) Temp Source: Axillary (04/26 0000) BP: 98/65 (04/26 0000) Pulse Rate: 64 (04/26 0000)  Labs: Recent Labs    07/10/23 0850 07/10/23 1039 07/10/23 1540 07/10/23 1540 07/10/23 2005 07/11/23 0016 07/11/23 0417 07/11/23 1051 07/11/23 1535 07/11/23 2223 07/12/23 0006  HGB 15.1  --  12.8*  --   --   --  13.9  --   --   --   --   HCT 44.3  --  36.6*  --   --   --  38.2*  --   --   --   --   PLT 256  --  234  --   --   --  215  --   --   --   --   APTT  --   --   --    < >  --  >200*  --  150*  --  53* 70*  LABPROT 18.7*  --  19.9*  --   --   --   --   --   --   --   --   INR 1.5*  --  1.7*  --   --   --   --   --   --   --   --   HEPARINUNFRC  --   --   --   --   --   --   --  >1.10*  --   --   --   CREATININE 5.58*  --  4.19*  --   --  2.71* 1.85*  --  1.27*  --   --   TROPONINIHS 164* 195*  --   --  199* 173*  --   --   --   --   --    < > = values in this interval not displayed.    Estimated Creatinine Clearance: 48.4 mL/min (A) (by C-G formula based on SCr of 1.27 mg/dL (H)).   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 03/2014 s/p SJM 2411-36C dual chamber AICD (serial Number [id number])- followed by Dr. Rodolfo Clan   Arthritis    a. L knee.   Asthma    Basal cell carcinoma 08/29/2008   Vertex scalp. Keratotic pattern, ulcerated.   Basal cell carcinoma 01/15/2018   Left distal medial thigh near knee. Fibroepithelioma of pinkus type   Basal cell carcinoma 01/15/2018   Right distal lat. nose ant.  inferior edge. Nodular pattern.   Cardiac arrest (HCC)    a. 03/30/2014 VF arrest in setting of NICM >> CPR/defib in community >> s/p dual chamber AICD   Coronary artery disease, non-occlusive    a. cath 03/2014 at [facility]: no sig CAD (OM1 30%, CFX 30%), EF 35%   Dementia (HCC)    Dysplastic nevus 01/31/2020   L mid back paraspinal - severe, excision 03/21/2020   HLD (hyperlipidemia)    HTN (hypertension)    Kidney stones    Melanoma (HCC)    Melanoma resected from scalp   NICM (nonischemic cardiomyopathy) (HCC)  a. 03/2014 Echo:  Inferolateral and lateral HK, EF 50-55%, grade 1 diastolic dysfunction, mild MR, mild LAE, mild RAE, trivial TR, no effusion; 09/2018 Echo: EF 35-40%, impaired relaxation, glob HK, sev apical HK. Nl RV fxn. RVSP . Mildly dil LA.   Paroxysmal atrial fibrillation (HCC) 03/28/2015   Retinal artery occlusion     Medications:  Scheduled:   vitamin C  500 mg Oral BID   Chlorhexidine  Gluconate Cloth  6 each Topical Daily   feeding supplement  237 mL Oral TID BM   hydrocortisone  sod succinate (SOLU-CORTEF ) inj  100 mg Intravenous Q8H   leptospermum manuka honey  1 Application Topical Daily   lidocaine   1 patch Transdermal Q24H   liver oil-zinc  oxide   Topical BID   multivitamin  1 tablet Oral QHS   mouth rinse  15 mL Mouth Rinse 4 times per day   phosphorus  500 mg Oral QID   pneumococcal 20-valent conjugate vaccine  0.5 mL Intramuscular Tomorrow-1000   QUEtiapine   25 mg Oral QHS   sodium chloride  flush  10-40 mL Intracatheter Q12H   thiamine   100 mg Oral Daily    Assessment: 78 year old male w/ PMH of HTN, nonischemic cardiomyopathy, atrial fibrillation,  ventricular tachycardia, HLD, dementia admitted with circulatory shock with  AKI. Noted to be on apixaban  PTA with last dose 07/08/23  Goal of Therapy:  Anti-Xa level 0.3-0.7 units/ml aPTT 66 - 102 seconds Monitor platelets by anticoagulation protocol: Yes   Plan:  4/25: aPTT @ 2223 = 53,  SUBtherapeutic - previous 2 aPTT's were elevated so odd it would suddenly become subtherapeutic.   RN reported pt lost IV access for ~ 30 min.   Will order STAT repeat aPTT.   4/26: aPTT @ 0006 = 70, therapeutic X 1  - aPTT from 2223 appears to have been error.  Will continue pt on current rate and recheck aPTT and HL in 8 hrs.  - will use aPTT to guide therapy until aPTT and Anti-Xa correlate ---Continue to monitor H&H and platelets  Bintou Lafata D 07/12/2023,12:32 AM

## 2023-07-12 NOTE — Consult Note (Signed)
 PHARMACY CONSULT NOTE - ELECTROLYTES  Pharmacy Consult for Electrolyte Monitoring and Replacement   Recent Labs: Height: 5\' 3"  (160 cm) Weight: 99.7 kg (219 lb 12.8 oz) IBW/kg (Calculated) : 56.9 Estimated Creatinine Clearance: 52.3 mL/min (by C-G formula based on SCr of 1.18 mg/dL). Potassium (mmol/L)  Date Value  07/12/2023 4.1  07/06/2014 4.2   Magnesium (mg/dL)  Date Value  40/98/1191 2.4   Calcium  (mg/dL)  Date Value  47/82/9562 7.9 (L)   Calcium , Total (mg/dL)  Date Value  13/10/6576 9.1   Albumin (g/dL)  Date Value  46/96/2952 2.8 (L)  01/15/2023 4.3  03/30/2014 3.5   Phosphorus (mg/dL)  Date Value  84/13/2440 2.7   Sodium (mmol/L)  Date Value  07/12/2023 135  06/26/2023 140  07/06/2014 141    Assessment  Ethan Vega. is a 78 y.o. male presenting with septic shock. PMH significant for HTN, cardiomyopathy, Afib, and HLD. Pharmacy has been consulted to monitor and replace electrolytes.  Goal of Therapy: Electrolytes WNL  Plan:  No electrolyte replacement needed Check renal function panel BID while on CRRT   Thank you for allowing pharmacy to be a part of this patient's care.  Trinidad Funk, PharmD Clinical Pharmacist 07/12/2023 8:09 AM

## 2023-07-12 NOTE — Consult Note (Signed)
 Consultation Note Date: 07/12/2023 at 1100  Patient Name: Ethan Vega.  DOB: 1945/11/13  MRN: [id number]  Age / Sex: 78 y.o., male  PCP: Ethan Daughters, MD Referring Physician: Erskin Hearing, MD  HPI/Patient Profile: 78 y.o. male  with past medical history of  essential hypertension, nonischemic cardiomyopathy history of paroxysmal A-fib and ventricular tachycardia, dyslipidemia. He presented to hospital 07/10/23 to have ICD generator changed. Staff found him to have open wounds to LLE, confusion and hypotension. He was subsequently sent to ED for evaluation. ED workup found patient to have AKI, leukocytosis and hypotension with creatinine 5.5 (baseline 1.7), Na+ 134, K+ 5.4, BUN 151, BNP 672.1, Trop 164, Lactic 2.2 and WBC 16.1.   He received IV fluid resuscitation but required pressor support. Daughter at bedside reported that patient lives alone, performing his ADLs independently. She adds that he complained of diarrhea and poor PO intake 2 weeks prior. Pt was unaware of the open sore to his LLE but did confirm chronic lower extremity pitting edema. He was admitted to ICU for circulatory shock with AKI.   Palliative care was consulted to discuss goals of care.   Clinical Assessment and Goals of Care: Extensive chart review completed prior to meeting patient including labs, vital signs, imaging, progress notes, orders, and available advanced directive documents from current and previous encounters. I then met with patient at bedside to discuss diagnosis prognosis, GOC, EOL wishes, disposition and options.  I introduced Palliative Medicine as specialized medical care for people living with serious illness. It focuses on providing relief from the symptoms and stress of a serious illness. The goal is to improve quality of life for both the patient and the family. I spoke via phone to Ethan Vega (daughter) after  visiting patient.   Elderly, ill-appearing male resting in bed. He awakens easily to verbal stimuli. He appears to have increased work of breathing without respiratory distress. He endorses shortness of breath, denies pain. Complains of weakness with trying to eat. He is alert to self but is unable to state year (1927), location or situation but does exhibit moments of clarity during visit. He identifies Ethan Vega as his daughter. Pressors currently infusing. CRRT continues at bedside.   As far as functional and nutritional status, Ethan Vega reports living alone with Ethan Vega helping him. Reports being able to prepare his own meals until a few weeks ago when he was having difficulty getting around. His daughter shares that her father lives alone but states "that will have to change now".  We discussed patient's current illness and what it means in the larger context of patient's on-going co-morbidities.  Natural disease trajectory and expectations at EOL were discussed.  I attempted to elicit values and goals of care important to the patient. Ethan Vega shares several times that he wants a chance to get better and that he does not want staff to "give up". He was assured that no one was giving up on him and we would give him the best care so he  may have the chance to recover.  Ethan Vega endorses wanting to give a few days for outcomes to see if her father will improve.   The difference between aggressive medical intervention and comfort care was considered in light of the patient's goals of care. Ethan Vega shares that she wants him to be treated, but does not want him to suffer. Discussed reassessing her father everyday for improvement or decline will help her make decisions for him when it is necessary.   Advance directives, concepts specific to code status and hydration, and rehospitalization were considered and discussed. [name] remains a DNR/DNI.   Education offered regarding concept specific to human  mortality and the limitations of medical interventions to prolong life when the body begins to fail to thrive.  Family is facing treatment option decisions, advanced directive, and anticipatory care needs.    Discussed with patient/family the importance of continued conversation with family and the medical providers regarding overall plan of care and treatment options, ensuring decisions are within the context of the patient's values and GOCs.    Questions and concerns were addressed.  Meeting planned 4/27 @ 0930 to further discuss goals of care.   Primary Decision Maker NEXT OF KIN- Ethan Vega (daughter)  Physical Exam Constitutional:      General: He is not in acute distress.    Appearance: He is ill-appearing.  HENT:     Mouth/Throat:     Mouth: Mucous membranes are dry.  Pulmonary:     Effort: No respiratory distress.     Comments: Increased WOB, no accessory muscle use Musculoskeletal:     Right lower leg: Edema present.     Left lower leg: Edema present.  Skin:    General: Skin is warm and dry.  Neurological:     Mental Status: He is alert. He is disoriented.     Motor: Weakness present.    Recommendations/Plan: Continue DNR/DNI status as previously documented    Continue current supportive interventions PMT to follow for continued GOC conversation  Palliative Assessment/Data: 40%     Thank you for this consult. Palliative medicine will continue to follow and assist holistically.   Time Total: 75 minutes  Time spent includes: Detailed review of medical records (labs, imaging, vital signs), medically appropriate exam (mental status, respiratory, cardiac, skin), discussed with treatment team, counseling and educating patient, family and staff, documenting clinical information, medication management and coordination of care.     Ethan Vega, Ethan Vega [facility] Palliative Medicine Team  07/12/2023 11:57 AM  Office [phone]  Pager [phone]     Please contact  Palliative Medicine Team providers via AMION for questions and concerns.

## 2023-07-12 NOTE — Progress Notes (Signed)
 PHARMACY - ANTICOAGULATION CONSULT NOTE  Pharmacy Consult for heparin  infusion Indication: atrial fibrillation  Allergies  Allergen Reactions   Entresto  [Sacubitril-Valsartan]     hallucinations    Patient Measurements: Height: 5\' 3"  (160 cm) Weight: 99.7 kg (219 lb 12.8 oz) IBW/kg (Calculated) : 56.9 HEPARIN  DW (KG): 77  Vital Signs: Temp: 98.7 F (37.1 C) (04/26 0730) Temp Source: Oral (04/26 0730) BP: 113/71 (04/26 0900) Pulse Rate: 76 (04/26 0900)  Labs: Recent Labs    07/10/23 0850 07/10/23 1039 07/10/23 1540 07/10/23 1540 07/10/23 2005 07/11/23 0016 07/11/23 0417 07/11/23 1051 07/11/23 1535 07/11/23 2223 07/12/23 0006 07/12/23 0357 07/12/23 0733  HGB 15.1  --  12.8*  --   --   --  13.9  --   --   --   --  14.0  --   HCT 44.3  --  36.6*  --   --   --  38.2*  --   --   --   --  40.0  --   PLT 256  --  234  --   --   --  215  --   --   --   --  226  --   APTT  --   --   --    < >  --  >200*  --  150*  --  53* 70*  --  58*  LABPROT 18.7*  --  19.9*  --   --   --   --   --   --   --   --   --   --   INR 1.5*  --  1.7*  --   --   --   --   --   --   --   --   --   --   HEPARINUNFRC  --   --   --   --   --   --   --  >1.10*  --   --   --   --  0.66  CREATININE 5.58*  --  4.19*  --   --  2.71* 1.85*  --  1.27*  --   --  1.18  --   TROPONINIHS 164* 195*  --   --  199* 173*  --   --   --   --   --   --   --    < > = values in this interval not displayed.    Estimated Creatinine Clearance: 52.3 mL/min (by C-G formula based on SCr of 1.18 mg/dL).   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 03/2014 s/p SJM 2411-36C dual chamber AICD (serial Number [id number])- followed by Dr. Rodolfo Clan   Arthritis    a. L knee.   Asthma    Basal cell carcinoma 08/29/2008   Vertex scalp. Keratotic pattern, ulcerated.   Basal cell carcinoma 01/15/2018   Left distal medial thigh  near knee. Fibroepithelioma of pinkus type   Basal cell carcinoma 01/15/2018   Right distal lat. nose ant. inferior edge. Nodular pattern.   Cardiac arrest (HCC)    a. 03/30/2014 VF arrest in setting of NICM >> CPR/defib in community >> s/p dual chamber AICD   Coronary artery disease, non-occlusive    a. cath 03/2014 at [facility]: no sig CAD (OM1 30%, CFX 30%), EF 35%   Dementia (HCC)    Dysplastic nevus  01/31/2020   L mid back paraspinal - severe, excision 03/21/2020   HLD (hyperlipidemia)    HTN (hypertension)    Kidney stones    Melanoma (HCC)    Melanoma resected from scalp   NICM (nonischemic cardiomyopathy) (HCC)    a. 03/2014 Echo:  Inferolateral and lateral HK, EF 50-55%, grade 1 diastolic dysfunction, mild MR, mild LAE, mild RAE, trivial TR, no effusion; 09/2018 Echo: EF 35-40%, impaired relaxation, glob HK, sev apical HK. Nl RV fxn. RVSP . Mildly dil LA.   Paroxysmal atrial fibrillation (HCC) 03/28/2015   Retinal artery occlusion     Medications:  Scheduled:   vitamin C  500 mg Oral BID   Chlorhexidine  Gluconate Cloth  6 each Topical Daily   feeding supplement  237 mL Oral TID BM   hydrocortisone  sod succinate (SOLU-CORTEF ) inj  100 mg Intravenous Q8H   leptospermum manuka honey  1 Application Topical Daily   lidocaine   1 patch Transdermal Q24H   liver oil-zinc  oxide   Topical BID   multivitamin  1 tablet Oral QHS   mouth rinse  15 mL Mouth Rinse 4 times per day   phosphorus  500 mg Oral QID   pneumococcal 20-valent conjugate vaccine  0.5 mL Intramuscular Tomorrow-1000   QUEtiapine   25 mg Oral QHS   sodium chloride  flush  10-40 mL Intracatheter Q12H   thiamine   100 mg Oral Daily    Assessment: 78 year old male w/ PMH of HTN, nonischemic cardiomyopathy, atrial fibrillation,  ventricular tachycardia, HLD, dementia admitted with circulatory shock with  AKI. Noted to be on apixaban  PTA with last dose 07/08/23  4/26 0733 aPTT 58 heparin  level 0.66.   Goal of Therapy:   Anti-Xa level 0.3-0.7 units/ml once heparin  level and aPTT correlate.  aPTT 66 - 102 seconds Monitor platelets by anticoagulation protocol: Yes   Plan:  aPTT is slightly subtherapeutic Will give heparin  bolus of 1200 units x 1 and increase heparin  infusion to 750 units/hr. Recheck aPTT in 8 hours. Heparin  level and CBC with AM labs.   Trinidad Funk, PharmD, BCPS 07/12/2023,9:19 AM

## 2023-07-12 NOTE — Progress Notes (Signed)
 PHARMACY - ANTICOAGULATION CONSULT NOTE  Pharmacy Consult for heparin  infusion Indication: atrial fibrillation  Allergies  Allergen Reactions   Entresto  [Sacubitril-Valsartan]     hallucinations    Patient Measurements: Height: 5\' 3"  (160 cm) Weight: 99.7 kg (219 lb 12.8 oz) IBW/kg (Calculated) : 56.9 HEPARIN  DW (KG): 77  Vital Signs: Temp: 97.7 F (36.5 C) (04/26 1115) Temp Source: Oral (04/26 1115) BP: 99/75 (04/26 1545) Pulse Rate: 72 (04/26 1545)  Labs: Recent Labs    07/10/23 0850 07/10/23 1039 07/10/23 1540 07/10/23 1540 07/10/23 2005 07/11/23 0016 07/11/23 0417 07/11/23 1051 07/11/23 1535 07/11/23 2223 07/12/23 0006 07/12/23 0357 07/12/23 0733 07/12/23 1535  HGB 15.1  --  12.8*  --   --   --  13.9  --   --   --   --  14.0  --   --   HCT 44.3  --  36.6*  --   --   --  38.2*  --   --   --   --  40.0  --   --   PLT 256  --  234  --   --   --  215  --   --   --   --  226  --   --   APTT  --   --   --    < >  --  >200*  --  150*  --    < > 70*  --  58* 80*  LABPROT 18.7*  --  19.9*  --   --   --   --   --   --   --   --   --   --   --   INR 1.5*  --  1.7*  --   --   --   --   --   --   --   --   --   --   --   HEPARINUNFRC  --   --   --   --   --   --   --  >1.10*  --   --   --   --  0.66  --   CREATININE 5.58*  --  4.19*  --   --  2.71* 1.85*  --  1.27*  --   --  1.18  --   --   TROPONINIHS 164* 195*  --   --  199* 173*  --   --   --   --   --   --   --   --    < > = values in this interval not displayed.    Estimated Creatinine Clearance: 52.3 mL/min (by C-G formula based on SCr of 1.18 mg/dL).   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 03/2014 s/p SJM 2411-36C dual chamber AICD (serial Number [id number])- followed by Dr. Rodolfo Clan   Arthritis    a. L knee.   Asthma    Basal cell carcinoma 08/29/2008   Vertex scalp. Keratotic pattern, ulcerated.    Basal cell carcinoma 01/15/2018   Left distal medial thigh near knee. Fibroepithelioma of pinkus type   Basal cell carcinoma 01/15/2018   Right distal lat. nose ant. inferior edge. Nodular pattern.   Cardiac arrest [facility])    a. 03/30/2014 VF arrest in setting of NICM >> CPR/defib in community >> s/p dual chamber AICD  Coronary artery disease, non-occlusive    a. cath 03/2014 at [facility]: no sig CAD (OM1 30%, CFX 30%), EF 35%   Dementia (HCC)    Dysplastic nevus 01/31/2020   L mid back paraspinal - severe, excision 03/21/2020   HLD (hyperlipidemia)    HTN (hypertension)    Kidney stones    Melanoma (HCC)    Melanoma resected from scalp   NICM (nonischemic cardiomyopathy) (HCC)    a. 03/2014 Echo:  Inferolateral and lateral HK, EF 50-55%, grade 1 diastolic dysfunction, mild MR, mild LAE, mild RAE, trivial TR, no effusion; 09/2018 Echo: EF 35-40%, impaired relaxation, glob HK, sev apical HK. Nl RV fxn. RVSP . Mildly dil LA.   Paroxysmal atrial fibrillation (HCC) 03/28/2015   Retinal artery occlusion     Medications:  Scheduled:   vitamin C  500 mg Oral BID   Chlorhexidine  Gluconate Cloth  6 each Topical Daily   feeding supplement  237 mL Oral TID BM   hydrocortisone  sod succinate (SOLU-CORTEF ) inj  100 mg Intravenous Q8H   leptospermum manuka honey  1 Application Topical Daily   lidocaine   1 patch Transdermal Q24H   liver oil-zinc  oxide   Topical BID   multivitamin  1 tablet Oral QHS   mouth rinse  15 mL Mouth Rinse 4 times per day   phosphorus  500 mg Oral QID   pneumococcal 20-valent conjugate vaccine  0.5 mL Intramuscular Tomorrow-1000   QUEtiapine   25 mg Oral QHS   sodium chloride  flush  10-40 mL Intracatheter Q12H   thiamine   100 mg Oral Daily    Assessment: 78 year old male w/ PMH of HTN, nonischemic cardiomyopathy, atrial fibrillation,  ventricular tachycardia, HLD, dementia admitted with circulatory shock with  AKI. Noted to be on apixaban  PTA with last dose  07/08/23  4/26 0733 aPTT 58 heparin  level 0.66 4/26 1535 aPTT 80  Goal of Therapy:  Anti-Xa level 0.3-0.7 units/ml once heparin  level and aPTT correlate.  aPTT 66 - 102 seconds Monitor platelets by anticoagulation protocol: Yes   Plan:  aPTT is therapeutic. Continue heparin  infusion at a rate of 750 units/hr. Recheck aPTT in 8 hours. Heparin  level and CBC with AM labs.   Alice Innocent, PharmD Clinical Pharmacist  07/12/2023,4:03 PM

## 2023-07-12 NOTE — Progress Notes (Signed)
 CRITICAL CARE     Name: Ethan Vega. MRN: [id number] DOB: May 13, 1945     LOS: 2   SUBJECTIVE FINDINGS & SIGNIFICANT EVENTS    History of Presenting Illness:  This is an 78 year old male with a history of essential hypertension, nonischemic cardiomyopathy history of paroxysmal A-fib and ventricular tachycardia, dyslipidemia who was seen earlier today with electrophysiology service to have ICD generator change however he was noted to have open sores on his left lower extremity and was mentating with confusion and encephalopathy with decision to abort procedure and reschedule.  He was subsequently sent to the ER.  His workup revealed severe AKI and leukocytosis and vitals with shock physiology.  Patient received IV fluid resuscitation but despite 3 L of intravenous fluids continues to require Levophed  support.  He reports diarrhea over the past week.  Patient currently lives alone in his home and was previously able to perform all activities of daily living.  He is unaware of the open score and cellulitic lesion of his left foot but does admit to chronic lower extremity pitting edema.  ICU admission requested for circulatory shock with severe AKI.   07/11/23- patient s/p CRRT overnight, does have sacral decub stage 2 present on arrival. On Rovephin,flagyl , vancomycin .  LLL cellulitic lesion without exudate and no osteo per imaging.  Remains on levophed  and vasopressin .  Startign renal diet via OGT.  07/12/23- patient seen at bedside, daughter is at bedside.  We discussed goals of care and ordered consultation with palliative care.  He is oliguric, remains on high dose 2 vasopressor req, prognosis is guarded and we are considering.   Lines/tubes :   Microbiology/Sepsis markers: Results for orders placed or performed  during the hospital encounter of 07/10/23  Resp panel by RT-PCR (RSV, Flu A&B, Covid) Anterior Nasal Swab     Status: None   Collection Time: 07/10/23  8:50 AM   Specimen: Anterior Nasal Swab  Result Value Ref Range Status   SARS Coronavirus 2 by RT PCR NEGATIVE NEGATIVE Final    Comment: (NOTE) SARS-CoV-2 target nucleic acids are NOT DETECTED.  The SARS-CoV-2 RNA is generally detectable in upper respiratory specimens during the acute phase of infection. The lowest concentration of SARS-CoV-2 viral copies this assay can detect is 138 copies/mL. A negative result does not preclude SARS-Cov-2 infection and should not be used as the sole basis for treatment or other patient management decisions. A negative result may occur with  improper specimen collection/handling, submission of specimen other than nasopharyngeal swab, presence of viral mutation(s) within the areas targeted by this assay, and inadequate number of viral copies(<138 copies/mL). A negative result must be combined with clinical observations, patient history, and epidemiological information. The expected result is Negative.  Fact Sheet for Patients:  BloggerCourse.com  Fact Sheet for Healthcare Providers:  SeriousBroker.it  This test is no t yet approved or cleared by the [place]  FDA and  has been authorized for detection and/or diagnosis of SARS-CoV-2 by FDA under an Emergency Use Authorization (EUA). This EUA will remain  in effect (meaning this test can be used) for the duration of the COVID-19 declaration under Section 564(b)(1) of the Act, 21 U.S.C.section 360bbb-3(b)(1), unless the authorization is terminated  or revoked sooner.       Influenza A by PCR NEGATIVE NEGATIVE Final   Influenza B by PCR NEGATIVE NEGATIVE Final    Comment: (NOTE) The Xpert Xpress SARS-CoV-2/FLU/RSV plus assay is intended as an aid in the diagnosis of  influenza from  Nasopharyngeal swab specimens and should not be used as a sole basis for treatment. Nasal washings and aspirates are unacceptable for Xpert Xpress SARS-CoV-2/FLU/RSV testing.  Fact Sheet for Patients: BloggerCourse.com  Fact Sheet for Healthcare Providers: SeriousBroker.it  This test is not yet approved or cleared by the [place]  FDA and has been authorized for detection and/or diagnosis of SARS-CoV-2 by FDA under an Emergency Use Authorization (EUA). This EUA will remain in effect (meaning this test can be used) for the duration of the COVID-19 declaration under Section 564(b)(1) of the Act, 21 U.S.C. section 360bbb-3(b)(1), unless the authorization is terminated or revoked.     Resp Syncytial Virus by PCR NEGATIVE NEGATIVE Final    Comment: (NOTE) Fact Sheet for Patients: BloggerCourse.com  Fact Sheet for Healthcare Providers: SeriousBroker.it  This test is not yet approved or cleared by the [place]  FDA and has been authorized for detection and/or diagnosis of SARS-CoV-2 by FDA under an Emergency Use Authorization (EUA). This EUA will remain in effect (meaning this test can be used) for the duration of the COVID-19 declaration under Section 564(b)(1) of the Act, 21 U.S.C. section 360bbb-3(b)(1), unless the authorization is terminated or revoked.  Performed at [facility], [address] [place], [place] [address]   Blood Culture (routine x 2)     Status: None (Preliminary result)   Collection Time: 07/10/23  8:50 AM   Specimen: BLOOD  Result Value Ref Range Status   Specimen Description BLOOD RIGHT [facility]  Final   Special Requests   Final    BOTTLES DRAWN AEROBIC AND ANAEROBIC Blood Culture results may not be optimal due to an inadequate volume of blood received in culture bottles   Culture   Final    NO GROWTH 2 DAYS Performed at [facility], [address], [place], [place] [address]    Report Status PENDING  Incomplete  Blood Culture (routine x 2)     Status: None (Preliminary result)   Collection Time: 07/10/23  8:05 PM   Specimen: BLOOD RIGHT HAND  Result Value Ref Range Status   Specimen Description BLOOD RIGHT HAND  Final   Special Requests   Final    BOTTLES DRAWN AEROBIC AND ANAEROBIC Blood Culture results may not be optimal due to an inadequate volume of blood received in culture bottles   Culture   Final    NO GROWTH 2 DAYS Performed at [facility], [address] [place], [place] [address]    Report Status PENDING  Incomplete  MRSA Next Gen by PCR, Nasal     Status: None   Collection Time: 07/10/23  8:35 PM   Specimen: Nasal Mucosa; Nasal Swab  Result Value Ref Range Status   MRSA by PCR Next Gen NOT DETECTED NOT DETECTED Final    Comment: (NOTE) The GeneXpert MRSA Assay (FDA approved for NASAL specimens only), is one component of a comprehensive MRSA colonization surveillance program. It is not intended to diagnose MRSA infection nor to guide or monitor treatment for MRSA infections. Test performance is not FDA approved in patients less than 80 years old. Performed at [facility], [address] [place], [place] [address]     Anti-infectives:  Anti-infectives (From admission, onward)    Start     Dose/Rate Route Frequency Ordered Stop   07/11/23 1500  vancomycin  (VANCOCIN ) IVPB 1000 mg/200 mL premix  Status:  Discontinued        1,000 mg 200 mL/hr  over 60 Minutes Intravenous Every 24 hours 07/10/23 1626 07/11/23 1201   07/11/23 1500  vancomycin  (VANCOREADY) IVPB 1500 mg/300 mL        1,500 mg 150 mL/hr over 120 Minutes Intravenous Every 24 hours 07/11/23 1201     07/11/23 1000  cefTRIAXone  (ROCEPHIN ) 2 g in sodium chloride  0.9 % 100 mL IVPB        2 g 200 mL/hr over 30 Minutes Intravenous Every 24 hours 07/10/23 1351     07/11/23 0800  vancomycin  variable dose per  unstable renal function (pharmacist dosing)  Status:  Discontinued         Does not apply See admin instructions 07/10/23 1359 07/11/23 1201   07/10/23 1400  metroNIDAZOLE  (FLAGYL ) IVPB 500 mg        500 mg 100 mL/hr over 60 Minutes Intravenous Every 12 hours 07/10/23 1351     07/10/23 1400  vancomycin  (VANCOCIN ) IVPB 1000 mg/200 mL premix        1,000 mg 200 mL/hr over 60 Minutes Intravenous  Once 07/10/23 1359 07/10/23 1518   07/10/23 0830  cefTRIAXone  (ROCEPHIN ) 2 g in sodium chloride  0.9 % 100 mL IVPB        2 g 200 mL/hr over 30 Minutes Intravenous Once 07/10/23 0818 07/10/23 0946   07/10/23 0830  vancomycin  (VANCOCIN ) IVPB 1000 mg/200 mL premix        1,000 mg 200 mL/hr over 60 Minutes Intravenous  Once 07/10/23 0818 07/10/23 1026        Consults:  Nephrology   PAST MEDICAL HISTORY   Past Medical History:  Diagnosis Date   Actinic keratosis    AICD (automatic cardioverter/defibrillator) present 03/31/2014   a. 03/2014 s/p SJM 2411-36C dual chamber AICD (serial Number [id number])- followed by Dr. Rodolfo Clan   Arthritis    a. L knee.   Asthma    Basal cell carcinoma 08/29/2008   Vertex scalp. Keratotic pattern, ulcerated.   Basal cell carcinoma 01/15/2018   Left distal medial thigh near knee. Fibroepithelioma of pinkus type   Basal cell carcinoma 01/15/2018   Right distal lat. nose ant. inferior edge. Nodular pattern.   Cardiac arrest (HCC)    a. 03/30/2014 VF arrest in setting of NICM >> CPR/defib in community >> s/p dual chamber AICD   Coronary artery disease, non-occlusive    a. cath 03/2014 at [facility]: no sig CAD (OM1 30%, CFX 30%), EF 35%   Dementia (HCC)    Dysplastic nevus 01/31/2020   L mid back paraspinal - severe, excision 03/21/2020   HLD (hyperlipidemia)    HTN (hypertension)    Kidney stones    Melanoma (HCC)    Melanoma resected from scalp   NICM (nonischemic cardiomyopathy) (HCC)    a. 03/2014 Echo:  Inferolateral and lateral HK, EF 50-55%, grade 1 diastolic  dysfunction, mild MR, mild LAE, mild RAE, trivial TR, no effusion; 09/2018 Echo: EF 35-40%, impaired relaxation, glob HK, sev apical HK. Nl RV fxn. RVSP . Mildly dil LA.   Paroxysmal atrial fibrillation (HCC) 03/28/2015   Retinal artery occlusion      SURGICAL HISTORY   Past Surgical History:  Procedure Laterality Date   adenomatous polyps     CARDIAC CATHETERIZATION  03/30/2014   CARDIOVERSION  03/30/2014   COLONOSCOPY WITH PROPOFOL  N/A 03/24/2017   Procedure: COLONOSCOPY WITH PROPOFOL ;  Surgeon: Deveron Fly, MD;  Location: [facility] ENDOSCOPY;  Service: Endoscopy;  Laterality: N/A;   COLONOSCOPY WITH PROPOFOL  N/A 01/07/2019   Procedure: COLONOSCOPY  WITH PROPOFOL ;  Surgeon: Toledo, Alphonsus Jeans, MD;  Location: [facility] ENDOSCOPY;  Service: Gastroenterology;  Laterality: N/A;   CYSTOSCOPY W/ LITHOLAPAXY / EHL     IMPLANTABLE CARDIOVERTER DEFIBRILLATOR IMPLANT N/A 03/31/2014   Procedure: IMPLANTABLE CARDIOVERTER DEFIBRILLATOR IMPLANT;  Surgeon: Tammie Fall, MD;  Location: [facility] CATH LAB;  Service: Cardiovascular;  Laterality: N/A;   JOINT REPLACEMENT     TONSILLECTOMY     "as a kid"   TOTAL KNEE ARTHROPLASTY Left ~ 2010   TOTAL KNEE ARTHROPLASTY WITH REVISION COMPONENTS Left ~ 2011   VARICOSE VEIN SURGERY Left ~ 2014     FAMILY HISTORY   Family History  Problem Relation Age of Onset   Esophageal cancer Mother        died at 24   Heart attack Father        died at 49   Hypertension Brother    Colon polyps Brother      SOCIAL HISTORY   Social History   Tobacco Use   Smoking status: Never   Smokeless tobacco: Never  Vaping Use   Vaping status: Never Used  Substance Use Topics   Alcohol use: No   Drug use: No     MEDICATIONS   Current Medication:  Current Facility-Administered Medications:    ascorbic acid (VITAMIN C) tablet 500 mg, 500 mg, Oral, BID, Yianni Skilling, MD, 500 mg at 07/12/23 1001   cefTRIAXone  (ROCEPHIN ) 2 g in sodium chloride  0.9 % 100 mL IVPB, 2  g, Intravenous, Q24H, Jaylanni Eltringham, MD, Paused at 07/12/23 5784   Chlorhexidine  Gluconate Cloth 2 % PADS 6 each, 6 each, Topical, Daily, Rust-Chester, Britton L, NP, 6 each at 07/11/23 2115   docusate sodium  (COLACE) capsule 100 mg, 100 mg, Oral, BID PRN, Donnita Farina, MD   feeding supplement (ENSURE ENLIVE / ENSURE PLUS) liquid 237 mL, 237 mL, Oral, TID BM, Troye Hiemstra, MD, 237 mL at 07/11/23 1236   heparin  ADULT infusion 100 units/mL (25000 units/250mL), 750 Units/hr, Intravenous, Continuous, Trinidad Funk, RPH, Last Rate: 7.5 mL/hr at 07/12/23 1000, 750 Units/hr at 07/12/23 1000   heparin  injection 1,000-6,000 Units, 1,000-6,000 Units, CRRT, PRN, Lateef, Munsoor, MD   hydrocortisone  sodium succinate  (SOLU-CORTEF ) 100 MG injection 100 mg, 100 mg, Intravenous, Q8H, Rust-Chester, Britton L, NP, 100 mg at 07/12/23 0504   leptospermum manuka honey (MEDIHONEY) paste 1 Application, 1 Application, Topical, Daily, Veronika Heard, MD, 1 Application at 07/12/23 6962   lidocaine  (LIDODERM ) 5 % 1 patch, 1 patch, Transdermal, Q24H, Rust-Chester, Britton L, NP, 1 patch at 07/11/23 0436   liver oil-zinc  oxide (DESITIN) 40 % ointment, , Topical, BID, Seanmichael Salmons, MD, 1 Application at 07/12/23 9528   metroNIDAZOLE  (FLAGYL ) IVPB 500 mg, 500 mg, Intravenous, Q12H, Taveon Enyeart, MD, Last Rate: 100 mL/hr at 07/12/23 1001, 500 mg at 07/12/23 1001   multivitamin (RENA-VIT) tablet 1 tablet, 1 tablet, Oral, QHS, Angelo Caroll, MD   norepinephrine  (LEVOPHED ) 16 mg in (0.064 mg/mL) premix infusion, 0-40 mcg/min, Intravenous, Titrated, Rust-Chester, Britton L, NP, Last Rate: 26.3 mL/hr at 07/12/23 1000, 28 mcg/min at 07/12/23 1000   Oral care mouth rinse, 15 mL, Mouth Rinse, 4 times per day, Rust-Chester, Britton L, NP, 15 mL at 07/12/23 0800   Oral care mouth rinse, 15 mL, Mouth Rinse, PRN, Wise Fees, MD   phosphorus (K PHOS  NEUTRAL) tablet 500 mg, 500 mg, Oral, QID, Ramonita Burow, RPH,  500 mg at 07/12/23 1002   pneumococcal 20-valent conjugate vaccine (PREVNAR 20) injection  0.5 mL, 0.5 mL, Intramuscular, Tomorrow-1000, Sanjna Haskew, MD   prismasol  BGK 4/2.5 infusion, , CRRT, Continuous, Lateef, Munsoor, MD, Last Rate: 400 mL/hr at 07/12/23 0918, New Bag at 07/12/23 0918   prismasol  BGK 4/2.5 infusion, , CRRT, Continuous, Lateef, Munsoor, MD, Last Rate: 400 mL/hr at 07/12/23 0918, New Bag at 07/12/23 0918   prismasol  BGK 4/2.5 infusion, , CRRT, Continuous, Lateef, Munsoor, MD, Last Rate: 2,000 mL/hr at 07/12/23 0954, New Bag at 07/12/23 0954   QUEtiapine  (SEROQUEL ) tablet 25 mg, 25 mg, Oral, QHS, Ouma, Phoebe Breed, NP, 25 mg at 07/11/23 2148   sodium chloride  flush (NS) 0.9 % injection 10-40 mL, 10-40 mL, Intracatheter, Q12H, Rust-Chester, Britton L, NP, 10 mL at 07/12/23 0730   sodium chloride  flush (NS) 0.9 % injection 10-40 mL, 10-40 mL, Intracatheter, PRN, Rust-Chester, Gara July, NP   thiamine  (VITAMIN B1) tablet 100 mg, 100 mg, Oral, Daily, Prairie Stenberg, MD, 100 mg at 07/12/23 1002   vancomycin  (VANCOREADY) IVPB 1500 mg/300 mL, 1,500 mg, Intravenous, Q24H, Adalberto Acton, RPH, Stopped at 07/11/23 1719   vasopressin  (PITRESSIN) 20 Units in 100 mL (0.2 unit/mL) infusion-*FOR SHOCK*, 0-0.04 Units/min, Intravenous, Continuous, Rust-Chester, Britton L, NP, Last Rate: 12 mL/hr at 07/12/23 1000, 0.04 Units/min at 07/12/23 1000  Facility-Administered Medications Ordered in Other Encounters:    technetium tetrofosmin  (TC-MYOVIEW ) injection 23.3 millicurie, 23.3 millicurie, Intravenous, Once PRN, Elmyra Haggard, MD    ALLERGIES   Entresto  [sacubitril-valsartan]    REVIEW OF SYSTEMS     10 point review of systems is negative except for back pain  PHYSICAL EXAMINATION   Vital Signs: Temp:  [98.3 F (36.8 C)-99 F (37.2 C)] 98.7 F (37.1 C) (04/26 0730) Pulse Rate:  [42-113] 73 (04/26 1000) Resp:  [15-40] 24 (04/26 1000) BP: (80-136)/(39-110) 121/100  (04/26 1000) SpO2:  [91 %-100 %] 96 % (04/26 1000) Weight:  [99.7 kg] 99.7 kg (04/26 0407)  GENERAL: Age appropriate slow to answer with mild encephalopathy HEAD: Normocephalic, atraumatic.  EYES: Pupils equal, round, reactive to light.  No scleral icterus.  MOUTH: Moist mucosal membrane. NECK: Supple. No thyromegaly. No nodules. No JVD.  PULMONARY: Crackles at the bases bilaterally CARDIOVASCULAR: S1 and S2. Regular rate and rhythm. No murmurs, rubs, or gallops.  GASTROINTESTINAL: Soft, nontender, non-distended. No masses. Positive bowel sounds. No hepatosplenomegaly.  MUSCULOSKELETAL: No swelling, clubbing, or edema.  NEUROLOGIC: Mild distress due to acute illness no focal deficits grossly SKIN: Cellulitic lesion of the dorsal aspect of the foot with surrounding erythema and bilateral pedal edema 2+   PERTINENT DATA     Infusions:  cefTRIAXone  (ROCEPHIN )  IV Stopped (07/12/23 1610)   heparin  750 Units/hr (07/12/23 1000)   metronidazole  500 mg (07/12/23 1001)   norepinephrine  (LEVOPHED ) Adult infusion 28 mcg/min (07/12/23 1000)   prismasol  BGK 4/2.5 400 mL/hr at 07/12/23 0918   prismasol  BGK 4/2.5 400 mL/hr at 07/12/23 0918   prismasol  BGK 4/2.5 2,000 mL/hr at 07/12/23 0954   vancomycin  Stopped (07/11/23 1719)   vasopressin  0.04 Units/min (07/12/23 1000)   Scheduled Medications:  vitamin C  500 mg Oral BID   Chlorhexidine  Gluconate Cloth  6 each Topical Daily   feeding supplement  237 mL Oral TID BM   hydrocortisone  sod succinate (SOLU-CORTEF ) inj  100 mg Intravenous Q8H   leptospermum manuka honey  1 Application Topical Daily   lidocaine   1 patch Transdermal Q24H   liver oil-zinc  oxide   Topical BID   multivitamin  1 tablet Oral QHS  mouth rinse  15 mL Mouth Rinse 4 times per day   phosphorus  500 mg Oral QID   pneumococcal 20-valent conjugate vaccine  0.5 mL Intramuscular Tomorrow-1000   QUEtiapine   25 mg Oral QHS   sodium chloride  flush  10-40 mL Intracatheter Q12H    thiamine   100 mg Oral Daily   PRN Medications: docusate sodium , heparin , mouth rinse, sodium chloride  flush Hemodynamic parameters:   Intake/Output: 04/25 0701 - 04/26 0700 In: 1768.1 [I.V.:1168.1; IV Piggyback:600] Out: 2966 [Urine:265]  Ventilator  Settings:     LAB RESULTS:  Basic Metabolic Panel: Recent Labs  Lab 07/10/23 0850 07/10/23 1540 07/11/23 0016 07/11/23 0417 07/11/23 1535 07/12/23 0357  NA 134*  --  135 134* 137 135  K 5.4*  --  4.7 4.5 4.9 4.1  CL 100  --  104 104 102 103  CO2 20*  --  20* 19* 22 22  GLUCOSE 96  --  133* 124* 178* 158*  BUN 151*  --  89* 64* 33* 20  CREATININE 5.58* 4.19* 2.71* 1.85* 1.27* 1.18  CALCIUM  9.2  --  8.4* 8.3* 7.5* 7.9*  MG  --   --   --  2.2  --  2.4  PHOS  --   --   --  2.2* 2.0* 2.7   Liver Function Tests: Recent Labs  Lab 07/10/23 0850 07/11/23 0417 07/11/23 1535 07/12/23 0357  AST 32  --   --   --   ALT 20  --   --   --   ALKPHOS 46  --   --   --   BILITOT 3.6*  --   --   --   PROT 6.5  --   --   --   ALBUMIN 3.5 3.0* 3.0* 2.8*   Recent Labs  Lab 07/10/23 1540  LIPASE 60*  AMYLASE 77   No results for input(s): "AMMONIA" in the last 168 hours. CBC: Recent Labs  Lab 07/10/23 0850 07/10/23 1540 07/11/23 0417 07/12/23 0357  WBC 16.1* 15.6* 14.5* 17.4*  NEUTROABS 14.1*  --   --   --   HGB 15.1 12.8* 13.9 14.0  HCT 44.3 36.6* 38.2* 40.0  MCV 93.9 91.7 89.7 93.5  PLT 256 234 215 226   Cardiac Enzymes: No results for input(s): "CKTOTAL", "CKMB", "CKMBINDEX", "TROPONINI" in the last 168 hours. BNP: Invalid input(s): "POCBNP" CBG: Recent Labs  Lab 07/10/23 1921 07/11/23 2239  GLUCAP 125* 145*       IMAGING RESULTS:     ASSESSMENT AND PLAN    -Multidisciplinary rounds held today  Septic shock -Present on admission with etiology appearing to be either left lower extremity cellulitis or intra-abdominal infection due to prolonged diarrhea -Empiric antibiotics with zosyn -MRSA  PCR -use vasopressors to keep MAP>65 -follow ABG and LA -follow up cultures -Blood and urine cultures -Central line access with CVP monitoring -consider stress dose steroids - Monitor lower extremity and consider surgical evaluation if demarcation worsens  Renal Failure-KDIGO stage 4 -Nephrology consultation- appreciate input - renal replacement therapy-starting to pull fluid  -follow chem 7 -follow UO -continue Foley Catheter-assess need daily   Chronic atrial fibrillation with non-ischemic cardiomyopathy - Status post ICD implantation  Altered mental status with encephalopathy Suspect toxic metabolic with overlying septic encephalopathy   ID -continue IV abx as prescibed -follow up cultures  GI/Nutrition GI PROPHYLAXIS as indicated DIET-->TF's as tolerated Constipation protocol as indicated  ENDO - ICU hypoglycemic\Hyperglycemia protocol -check FSBS per protocol  ELECTROLYTES -follow labs as needed -replace as needed -pharmacy consultation   DVT/GI PRX ordered -SCDs  TRANSFUSIONS AS NEEDED MONITOR FSBS ASSESS the need for LABS as needed    Critical care provider statement:   Total critical care time: 33 minutes   Performed by: Jaclynn Mast MD   Critical care time was exclusive of separately billable procedures and treating other patients.   Critical care was necessary to treat or prevent imminent or life-threatening deterioration.   Critical care was time spent personally by me on the following activities: development of treatment plan with patient and/or surrogate as well as nursing, discussions with consultants, evaluation of patient's response to treatment, examination of patient, obtaining history from patient or surrogate, ordering and performing treatments and interventions, ordering and review of laboratory studies, ordering and review of radiographic studies, pulse oximetry and re-evaluation of patient's condition.    Khriz Liddy, M.D.   Pulmonary & Critical Care Medicine

## 2023-07-12 NOTE — Plan of Care (Signed)
  Problem: Clinical Measurements: Goal: Ability to maintain clinical measurements within normal limits will improve Outcome: Progressing Goal: Diagnostic test results will improve Outcome: Progressing Goal: Respiratory complications will improve Outcome: Progressing Goal: Cardiovascular complication will be avoided Outcome: Progressing   Problem: Activity: Goal: Risk for activity intolerance will decrease Outcome: Progressing   Problem: Nutrition: Goal: Adequate nutrition will be maintained Outcome: Not Progressing   Problem: Coping: Goal: Level of anxiety will decrease Outcome: Progressing   Problem: Elimination: Goal: Will not experience complications related to bowel motility Outcome: Progressing Goal: Will not experience complications related to urinary retention Outcome: Not Progressing

## 2023-07-12 NOTE — Plan of Care (Signed)
 Pt A&OX1-2 this shift. Hallucinates people and animals in his room at times. Appears to be more oriented when Daughter at bedside. Minimal urine output. CRRT running with no issues. Denies pain. RN able to wean levophed  down some this shift. Pt NPO due to coughing with POs this AM. Intermittent runs non-sustained Vtach, Providers made aware. Pt became agitated and anxious this afternoon, taking off oxygen and c/o being very hot. Became SOB. Pt mistook RN for his daughter. Pt cooled down with thermostat and fan. Oxygen re-applied. Morphine  one time dose administered for anxiety and SOB, effective. Pt now resting in bed comfortably.

## 2023-07-13 DIAGNOSIS — Z515 Encounter for palliative care: Secondary | ICD-10-CM | POA: Diagnosis not present

## 2023-07-13 DIAGNOSIS — N179 Acute kidney failure, unspecified: Secondary | ICD-10-CM | POA: Diagnosis not present

## 2023-07-13 DIAGNOSIS — Z7189 Other specified counseling: Secondary | ICD-10-CM | POA: Diagnosis not present

## 2023-07-13 DIAGNOSIS — Z66 Do not resuscitate: Secondary | ICD-10-CM | POA: Diagnosis not present

## 2023-07-13 LAB — CBC
HCT: 39.4 % (ref 39.0–52.0)
Hemoglobin: 13.9 g/dL (ref 13.0–17.0)
MCH: 32.5 pg (ref 26.0–34.0)
MCHC: 35.3 g/dL (ref 30.0–36.0)
MCV: 92.1 fL (ref 80.0–100.0)
Platelets: 208 10*3/uL (ref 150–400)
RBC: 4.28 MIL/uL (ref 4.22–5.81)
RDW: 15.1 % (ref 11.5–15.5)
WBC: 17.6 10*3/uL — ABNORMAL HIGH (ref 4.0–10.5)
nRBC: 0 % (ref 0.0–0.2)

## 2023-07-13 LAB — RENAL FUNCTION PANEL
Albumin: 2.5 g/dL — ABNORMAL LOW (ref 3.5–5.0)
Albumin: 2.4 g/dL — ABNORMAL LOW (ref 3.5–5.0)
Anion gap: 12 (ref 5–15)
Anion gap: 5 (ref 5–15)
BUN: 13 mg/dL (ref 8–23)
BUN: 11 mg/dL (ref 8–23)
CO2: 22 mmol/L (ref 22–32)
CO2: 24 mmol/L (ref 22–32)
Calcium: 8.1 mg/dL — ABNORMAL LOW (ref 8.9–10.3)
Calcium: 7.9 mg/dL — ABNORMAL LOW (ref 8.9–10.3)
Chloride: 103 mmol/L (ref 98–111)
Chloride: 104 mmol/L (ref 98–111)
Creatinine, Ser: 0.99 mg/dL (ref 0.61–1.24)
Creatinine, Ser: 1.06 mg/dL (ref 0.61–1.24)
GFR, Estimated: >60 mL/min (ref >60)
GFR, Estimated: >60 mL/min (ref >60)
Glucose, Bld: 132 mg/dL — ABNORMAL HIGH (ref 70–99)
Glucose, Bld: 129 mg/dL — ABNORMAL HIGH (ref 70–99)
Phosphorus: 2.2 mg/dL — ABNORMAL LOW (ref 2.5–4.6)
Phosphorus: 2.9 mg/dL (ref 2.5–4.6)
Potassium: 4.2 mmol/L (ref 3.5–5.1)
Potassium: 3.8 mmol/L (ref 3.5–5.1)
Sodium: 137 mmol/L (ref 135–145)
Sodium: 133 mmol/L — ABNORMAL LOW (ref 135–145)

## 2023-07-13 LAB — APTT: aPTT: 75 s — ABNORMAL HIGH (ref 24–36)

## 2023-07-13 LAB — GLUCOSE, CAPILLARY
Glucose-Capillary: 111 mg/dL — ABNORMAL HIGH (ref 70–99)
Glucose-Capillary: 136 mg/dL — ABNORMAL HIGH (ref 70–99)

## 2023-07-13 LAB — HEPARIN LEVEL (UNFRACTIONATED)
Heparin Unfractionated: 0.33 [IU]/mL (ref 0.30–0.70)
Heparin Unfractionated: 0.57 [IU]/mL (ref 0.30–0.70)

## 2023-07-13 LAB — MAGNESIUM: Magnesium: 2.7 mg/dL — ABNORMAL HIGH (ref 1.7–2.4)

## 2023-07-13 LAB — VANCOMYCIN, RANDOM: Vancomycin Rm: 15 ug/mL

## 2023-07-13 MED ORDER — SODIUM PHOSPHATES 45 MMOLE/15ML IV SOLN
21.0000 mmol | Freq: Once | INTRAVENOUS | Status: AC
  Administered 2023-07-13: 21 mmol via INTRAVENOUS
  Filled 2023-07-13: qty 7

## 2023-07-13 MED ORDER — VANCOMYCIN HCL 2000 MG/400ML IV SOLN
2000.0000 mg | INTRAVENOUS | Status: DC
  Administered 2023-07-13: 2000 mg via INTRAVENOUS
  Filled 2023-07-13 (×2): qty 400

## 2023-07-13 MED ORDER — THIAMINE HCL 100 MG/ML IJ SOLN
100.0000 mg | Freq: Every day | INTRAMUSCULAR | Status: AC
  Administered 2023-07-13 – 2023-07-17 (×5): 100 mg via INTRAVENOUS
  Filled 2023-07-13 (×5): qty 2

## 2023-07-13 MED ORDER — VANCOMYCIN HCL 2000 MG/400ML IV SOLN: 2000.0000 mg | INTRAVENOUS | Status: DC

## 2023-07-13 MED ORDER — POTASSIUM CHLORIDE 10 MEQ/100ML IV SOLN
10.0000 meq | INTRAVENOUS | Status: AC
  Administered 2023-07-13 (×2): 10 meq via INTRAVENOUS
  Filled 2023-07-13 (×2): qty 100

## 2023-07-13 NOTE — Progress Notes (Signed)
 Palliative Care Progress Note, Assessment & Plan   Patient Name: Ethan Vega.       Date: 07/13/2023 DOB: [birth year]  Age: 78 y.o. MRN#: [id number] Attending Physician: Erskin Hearing, MD Primary Care Physician: Shari Daughters, MD Admit Date: 07/10/2023  Subjective: Pt reports feeling better. Some SOB with cough. He denies pain. Wants to get better and return home.   HPI: 78 y.o. male  with past medical history of  essential hypertension, nonischemic cardiomyopathy history of paroxysmal A-fib and ventricular tachycardia, dyslipidemia. He presented to hospital 07/10/23 to have ICD generator changed. Staff found him to have open wounds to LLE, confusion and hypotension. He was subsequently sent to ED for evaluation. ED workup found patient to have AKI, leukocytosis and hypotension with creatinine 5.5 (baseline 1.7), Na+ 134, K+ 5.4, BUN 151, BNP 672.1, Trop 164, Lactic 2.2 and WBC 16.1.   He received IV fluid resuscitation but required pressor support. Daughter at bedside reported that patient lives alone, performing his ADLs independently. She adds that he complained of diarrhea and poor PO intake 2 weeks prior. Pt was unaware of the open sore to his LLE but did confirm chronic lower extremity pitting edema. He was admitted to ICU for circulatory shock with AKI.    Palliative care was consulted to discuss goals of care.   Summary of counseling/coordination of care: Extensive chart review completed prior to meeting patient including labs, vital signs, imaging, progress notes, orders, and available advanced directive documents from current and previous encounters.   After reviewing the patient's chart and assessing the patient at bedside, I spoke with patient and his 2 daughters, Mattaliano and Oneida Castle, at  the bedside in regards to symptom management and goals of care.   Ill-appearing, elderly male. He is alert to self and DOB, but unable to state correct year and month. He has even, unlabored respirations, but unable to speak in complete sentences when he starts to get anxious about conversation. He is in no distress.   Met with daughters and patient for planned GOC meeting. Hogenson lives local and was helping take care of her father. Candy, resides in [place] , and has just arrived to see her father.  Confirmed that both daughters have clear understanding that their father is critically ill, being treated for severe sepsis and renal failure and requiring life supporting medication to sustain adequate BP. Snouffer agrees that her father appears much better than yesterday, but is still confused at times.  She shares that prior to this admit, she had tried to convince her father that he needed to go to the hospital after weeks of poor po intake, weakness and diarrhea, but he refused. She expresses being thankful that he was scheduled for ICD intervention and staff sent him to the ED.   Candy and Frison both agree that their father is a DNR, but refer to CPR saving his life 9 years ago when he suffered cardiac arrest while walking at the mall. They understand due to his frailty, advanced age, dementia and current medical condition, CPR would not be beneficial and potentially more harmful.    Discussed patient being NPO for coughing after consuming fluids. Patient and daughters agree that  he does not want a feeding tube if he is unable to swallow/eat normally.   Mr. Forcucci was later evaluated by SLP who recommends Dysphagia 3 (mech soft) diet.   All are in agreement since Mr. Batdorf has only been admitted for 3 days, they would like time for outcomes. PMT will continue to follow for needs.   Therapeutic silence and active listening provided for patient and family to share their thoughts and emotions regarding  current medical situation.  Emotional support provided.  Physical Exam Vitals reviewed.  Constitutional:      General: He is not in acute distress.    Appearance: He is ill-appearing.  HENT:     Head: Normocephalic and atraumatic.     Mouth/Throat:     Mouth: Mucous membranes are dry.  Pulmonary:     Effort: Pulmonary effort is normal. No respiratory distress.  Musculoskeletal:     Right lower leg: No edema.     Left lower leg: No edema.  Skin:    General: Skin is warm and dry.     Comments: BUE bruising  Neurological:     Mental Status: He is alert. He is disoriented.     Motor: Weakness present.  Psychiatric:     Comments: Anxious at times      Recommendations/Plan: Confirmed DNR/DNI status Continue current supportive interventions Allow time for outcomes  PMT will continue to follow up    Total Time 50 minutes   Time spent includes: Detailed review of medical records (labs, imaging, vital signs), medically appropriate exam (mental status, respiratory, cardiac, skin), discussed with treatment team, counseling and educating patient, family and staff, documenting clinical information, medication management and coordination of care.  Discussed plan of care with primary RN.    Ina Manas, Joyice Nodal [facility] Palliative Medicine Team  07/13/2023 8:37 AM  Office [phone]  Pager [phone]

## 2023-07-13 NOTE — Evaluation (Signed)
 Clinical/Bedside Swallow Evaluation Patient Details  Name: Ethan Vega. MRN: [id number] Date of Birth: 03-Dec-1945  Today's Date: 07/13/2023 Time: SLP Start Time (ACUTE ONLY): 1210 SLP Stop Time (ACUTE ONLY): 1300 SLP Time Calculation (min) (ACUTE ONLY): 50 min  Past Medical History:  Past Medical History:  Diagnosis Date   Actinic keratosis    AICD (automatic cardioverter/defibrillator) present 03/31/2014   a. 03/2014 s/p SJM 2411-36C dual chamber AICD (serial Number [id number])- followed by Dr. Rodolfo Clan   Arthritis    a. L knee.   Asthma    Basal cell carcinoma 08/29/2008   Vertex scalp. Keratotic pattern, ulcerated.   Basal cell carcinoma 01/15/2018   Left distal medial thigh near knee. Fibroepithelioma of pinkus type   Basal cell carcinoma 01/15/2018   Right distal lat. nose ant. inferior edge. Nodular pattern.   Cardiac arrest (HCC)    a. 03/30/2014 VF arrest in setting of NICM >> CPR/defib in community >> s/p dual chamber AICD   Coronary artery disease, non-occlusive    a. cath 03/2014 at [facility]: no sig CAD (OM1 30%, CFX 30%), EF 35%   Dementia (HCC)    Dysplastic nevus 01/31/2020   L mid back paraspinal - severe, excision 03/21/2020   HLD (hyperlipidemia)    HTN (hypertension)    Kidney stones    Melanoma (HCC)    Melanoma resected from scalp   NICM (nonischemic cardiomyopathy) (HCC)    a. 03/2014 Echo:  Inferolateral and lateral HK, EF 50-55%, grade 1 diastolic dysfunction, mild MR, mild LAE, mild RAE, trivial TR, no effusion; 09/2018 Echo: EF 35-40%, impaired relaxation, glob HK, sev apical HK. Nl RV fxn. RVSP . Mildly dil LA.   Paroxysmal atrial fibrillation (HCC) 03/28/2015   Retinal artery occlusion    Past Surgical History:  Past Surgical History:  Procedure Laterality Date   adenomatous polyps     CARDIAC CATHETERIZATION  03/30/2014   CARDIOVERSION  03/30/2014   COLONOSCOPY WITH PROPOFOL  N/A 03/24/2017   Procedure: COLONOSCOPY WITH PROPOFOL ;  Surgeon: Deveron Fly, MD;  Location: [facility];  Service: Endoscopy;  Laterality: N/A;   COLONOSCOPY WITH PROPOFOL  N/A 01/07/2019   Procedure: COLONOSCOPY WITH PROPOFOL ;  Surgeon: Toledo, Alphonsus Jeans, MD;  Location: [facility] ENDOSCOPY;  Service: Gastroenterology;  Laterality: N/A;   CYSTOSCOPY W/ LITHOLAPAXY / EHL     IMPLANTABLE CARDIOVERTER DEFIBRILLATOR IMPLANT N/A 03/31/2014   Procedure: IMPLANTABLE CARDIOVERTER DEFIBRILLATOR IMPLANT;  Surgeon: Tammie Fall, MD;  Location[facility] CATH LAB;  Service: Cardiovascular;  Laterality: N/A;   JOINT REPLACEMENT     TONSILLECTOMY     "as a kid"   TOTAL KNEE ARTHROPLASTY Left ~ 2010   TOTAL KNEE ARTHROPLASTY WITH REVISION COMPONENTS Left ~ 2011   VARICOSE VEIN SURGERY Left ~ 2014   HPI:  Pt is a 78 y.o. male with a past medical history of heart failure, hypertension, hyperlipidemia, and paroxysmal atrial fibrillation who presents from procedure lab today for hypotension.  Family states patient has been unable to get up at home and needed EMS to transport him to the hospital.  His procedure was canceled by Dr. Rodolfo Clan due to these symptoms and vital signs.  Per patient's caregiver at bedside states low blood pressure, diarrhea for 2 weeks, intermittent vomiting, decreased p.o. intake, and no medications for the last 1 week.  Patient is also noted to have lethargy, generalized weakness, and fatigue.  Family also notes patient has had persistent bilateral lower extremity edema with chronic wounds to the bilateral  feet.  Caregiver states that over the past week there has been spreading erythema up from a wound on the dorsum of the left foot. Pt lives alone.   Pt has a Baseline of: "New tiny strokes since 2015 most likely causing his Dementia. Will start Aricept  for symptoms as Alzheimer's can coexist." per Head CT Imaging note in chart.  Per CT Imaging and CXR at Admit: "Lower chest: The visualized lung bases are clear. There is coronary  vascular calcification; no active  disease.".    OF NOTE: Per NSG report, pt had a choking event when attempting to swallow Pills w/ water.  Pt was made NPO at that time; he had been on a more mech soft diet w/ thins prior per report.     Assessment / Plan / Recommendation  Clinical Impression   Pt seen for BSE today. Pt awake, talkative w/ tangential speech. Pt has a Baseline of: "New tiny strokes since 2015 most likely causing his Dementia. Will start Aricept  for symptoms as Alzheimer's can coexist." per Imaging note in chart. Pt followed instructions w/ cues and benefited from reduced distractions during po tasks. Pt was on HD in his room. NSG present. Pt stated he lived w/ his "Wife" at home. Confusion noted in his description of home life.  On Cherry O2 2L; afebrile.   Pt appears to present w/ functional oropharyngeal phase swallowing in setting of declined Cognitive status; suspected Dementia(per chart/Imaging note) at Baseline. Pt has been living alone. ANY Cognitive decline can impact overall awareness/timing of swallow thus safety during po tasks which increases risk for aspiration, choking. Pt's risk for aspiration can be reduced when following general aspiration precautions and using a modified diet consistency of easy to chew foods moistened w/ reduced Distractions during po tasks. He required min-mod verbal/visual/tactile cues for follow through during po tasks and self-feeding.        Pt consumed several trials of ice chips, purees, cut/moistened solids and thin liquids via cup/straw w/ No overt, clinical s/s of aspiration noted: no decline in vocal quality, no cough, and no decline in respiratory status during/post trials. O2 sats 98% during. Oral phase was adequate for bolus management and oral clearing of the boluses given. Mastication of moistened solids adequate and oral clearing appropriate. Pt self fed given min-mod support and setup/guidance during the tasks -- suspected related to the Cognitive decline. He was able to  feed self which improves safety of swallowing. Pt swallowed a Pill whole in puree w/ NSG/SLP which cleared after min lengthy oral phase -- suspect impact from Cognitive decline, coordination. OM Exam appeared [facility] w/ No unilateral weakness noted.          In setting of Baseline Cognitive decline and acute illness/hospitalization, recommend the dysphagia level 3(mech soft) foods moistened for ease of mastication; thin liquids. Recommend general aspiration precautions; reduce Distractions during meals and engage pt during meals for self-feeding. Pills Crushed in Puree for safer, easier swallowing. Support w/ feeding at meals as needed.  MD/NSG updated.  ST services recommends follow w/ Dietician for support; Palliative Care for support. Suspect pt is close to/at his baseline. Precautions posted in room, chart. NSG agreed. SLP Visit Diagnosis: Dysphagia, unspecified (R13.10) (impact from Cognitive decline/suspected Dementia; illness/hospitalization impact also(lying in bed taking po's))    Aspiration Risk   (reduced following general aspiration precautions)    Diet Recommendation   Thin;Dysphagia 3 (mechanical soft) (gravies to moisten) = dysphagia level 3(mech soft) foods moistened for ease of mastication;  thin liquids. Recommend general aspiration precautions; reduce Distractions during meals and engage pt during meals for self-feeding. Support w/ feeding at meals as needed.   Medication Administration: Crushed with puree    Other  Recommendations Recommended Consults:  (Dietician f/u) Oral Care Recommendations: Oral care BID;Oral care before and after PO;Staff/trained caregiver to provide oral care    Recommendations for follow up therapy are one component of a multi-disciplinary discharge planning process, led by the attending physician.  Recommendations may be updated based on patient status, additional functional criteria and insurance authorization.  Follow up Recommendations No SLP follow up       Assistance Recommended at Discharge  Intermittent  Functional Status Assessment Patient has had a recent decline in their functional status and demonstrates the ability to make significant improvements in function in a reasonable and predictable amount of time.  Frequency and Duration  (n/a)   (n/a)       Prognosis Prognosis for improved oropharyngeal function: Fair (-Good) Barriers to Reach Goals: Cognitive deficits;Language deficits Barriers/Prognosis Comment: impact from Cognitive decline/suspected Dementia; illness/hospitalization impact also(lying in bed taking po's)      Swallow Study   General Date of Onset: 07/10/23 HPI: Pt is a 78 y.o. male with a past medical history of heart failure, hypertension, hyperlipidemia, and paroxysmal atrial fibrillation who presents from procedure lab today for hypotension.  Family states patient has been unable to get up at home and needed EMS to transport him to the hospital.  His procedure was canceled by Dr. Rodolfo Clan due to these symptoms and vital signs.  Per patient's caregiver at bedside states low blood pressure, diarrhea for 2 weeks, intermittent vomiting, decreased p.o. intake, and no medications for the last 1 week.  Patient is also noted to have lethargy, generalized weakness, and fatigue.  Family also notes patient has had persistent bilateral lower extremity edema with chronic wounds to the bilateral feet.  Caregiver states that over the past week there has been spreading erythema up from a wound on the dorsum of the left foot.   Pt has a Baseline of: "New tiny strokes since 2015 most likely causing his Dementia. Will start Aricept  for symptoms as Alzheimer's can coexist." per Head CT Imaging note in chart.  Per CT Imaging and CXR at Admit: "Lower chest: The visualized lung bases are clear. There is coronary  vascular calcification; no active disease.".   OF NOTE: Per NSG report, pt had a chokding event when attempting to swallow Pills w/  water.  Pt was made NPO at that time; he had been on a more mech soft diet w/ thins prior per report. Type of Study: Bedside Swallow Evaluation Previous Swallow Assessment: none Diet Prior to this Study: NPO (regular prior) Temperature Spikes Noted: No (wbc elevated currently) Respiratory Status: Nasal cannula (2L) History of Recent Intubation: No Behavior/Cognition: Alert;Cooperative;Pleasant mood;Confused;Distractible;Requires cueing Oral Cavity Assessment: Within Functional Limits Oral Care Completed by SLP: Recent completion by staff Oral Cavity - Dentition: Adequate natural dentition Vision: Functional for self-feeding Self-Feeding Abilities: Able to feed self;Needs set up;Needs assist (confusion) Patient Positioning: Upright in bed (max assist w/ sitting up) Baseline Vocal Quality: Normal Volitional Cough: Strong Volitional Swallow: Able to elicit    Oral/Motor/Sensory Function Overall Oral Motor/Sensory Function: Within functional limits   Ice Chips Ice chips: Within functional limits Presentation: Spoon (fed; 1 trial)   Thin Liquid Thin Liquid: Within functional limits Presentation: Self Fed;Cup;Straw (5 trials via each method w/ multiple sips via straw; ~2-3 ozs total)  Other Comments: pt on HD    Nectar Thick Nectar Thick Liquid: Not tested   Honey Thick Honey Thick Liquid: Not tested   Puree Puree: Within functional limits Presentation: Spoon (fed; 5 trials)   Solid     Solid: Within functional limits Presentation: Self Fed (supported; 6 trials) Other Comments: moistened        [name], MS, CCC-SLP Speech Language Pathologist Rehab Services; [facility] [phone] (ascom) [place] 07/13/2023,1:07 PM

## 2023-07-13 NOTE — Consult Note (Signed)
 Pharmacy Antibiotic Note  Ethan Vega. is a 78 y.o. male w/ PMH of HTN, nonischemic cardiomyopathy, atrial fibrillation, ventricular tachycardia, HLD, dementia admitted on 07/10/2023 with sepsis and cellulitis w/ AKI. Pharmacy has been consulted for vancomycin  dosing.  Assessment: Current regimen: Vancomycin  1500 mg IV Q24H (~18 mg/kg) Preceding dose 4/26 @ 1700  Vanc rm 4/27 @ 1345: 15 ug/mL PK Calculations Cmin: 11.7 T 1/2: 13.5hr Vanc adjusted level for time is below goal of 15-25   Plan:  CRRT continued Increase vancomycin  dose to 2000 mg IV every 24 hours (~24 mg/kg) Expected Cmin: 15.8 Plan to order a vancomycin  level prior to 3rd dose Goal vancomycin  level 15 - 25 mcg/mL  Height: 5\' 3"  (160 cm) Weight: 83.7 kg (184 lb 8.4 oz) IBW/kg (Calculated) : 56.9  Temp (24hrs), Avg:98 F (36.7 C), Min:97.4 F (36.3 C), Max:98.7 F (37.1 C)  Recent Labs  Lab 07/10/23 0850 07/10/23 1039 07/10/23 1540 07/11/23 0016 07/11/23 0417 07/11/23 1535 07/12/23 0357 07/12/23 1535 07/13/23 0317 07/13/23 1345  WBC 16.1*  --  15.6*  --  14.5*  --  17.4*  --  17.6*  --   CREATININE 5.58*  --  4.19*   < > 1.85* 1.27* 1.18 1.02 0.99  --   LATICACIDVEN 2.2* 2.8* 1.1  --   --   --   --   --   --   --   VANCORANDOM  --   --   --   --   --   --   --   --   --  15   < > = values in this interval not displayed.    Estimated Creatinine Clearance: 56.9 mL/min (by C-G formula based on SCr of 0.99 mg/dL).    Allergies  Allergen Reactions   Entresto  [Sacubitril-Valsartan]     hallucinations    Antimicrobials this admission: vancomycin  4/24 >>  ceftriaxone  4/24 >>  metronidazole  4/24 >>  Microbiology results: 4/24 BCx: NGTD 4/24 MRSA PCR: negative  Thank you for allowing pharmacy to be a part of this patient's care.  Alice Innocent, PharmD 07/13/2023 2:52 PM

## 2023-07-13 NOTE — Progress Notes (Signed)
 CRITICAL CARE     Name: Ethan Vega. MRN: [id number] DOB: June 09, 1945     LOS: 3   SUBJECTIVE FINDINGS & SIGNIFICANT EVENTS    History of Presenting Illness:  This is an 78 year old male with a history of essential hypertension, nonischemic cardiomyopathy history of paroxysmal A-fib and ventricular tachycardia, dyslipidemia who was seen earlier today with electrophysiology service to have ICD generator change however he was noted to have open sores on his left lower extremity and was mentating with confusion and encephalopathy with decision to abort procedure and reschedule.  He was subsequently sent to the ER.  His workup revealed severe AKI and leukocytosis and vitals with shock physiology.  Patient received IV fluid resuscitation but despite 3 L of intravenous fluids continues to require Levophed  support.  He reports diarrhea over the past week.  Patient currently lives alone in his home and was previously able to perform all activities of daily living.  He is unaware of the open score and cellulitic lesion of his left foot but does admit to chronic lower extremity pitting edema.  ICU admission requested for circulatory shock with severe AKI.   07/11/23- patient s/p CRRT overnight, does have sacral decub stage 2 present on arrival. On Rovephin,flagyl , vancomycin .  LLL cellulitic lesion without exudate and no osteo per imaging.  Remains on levophed  and vasopressin .  Startign renal diet via OGT.  07/12/23- patient seen at bedside, daughter is at bedside.  We discussed goals of care and ordered consultation with palliative care.  He is oliguric, remains on high dose 2 vasopressor req, prognosis is guarded and we are considering.    07/13/23- patient with mild improvement from 30 to 8 on levophed  dose, CRRT -50/h with  olgiuric AKI, mentation improved, still overall poor prognosis long term and is working with Palliative care specialist.  Lines/tubes :   Microbiology/Sepsis markers: Results for orders placed or performed during the hospital encounter of 07/10/23  Resp panel by RT-PCR (RSV, Flu A&B, Covid) Anterior Nasal Swab     Status: None   Collection Time: 07/10/23  8:50 AM   Specimen: Anterior Nasal Swab  Result Value Ref Range Status   SARS Coronavirus 2 by RT PCR NEGATIVE NEGATIVE Final    Comment: (NOTE) SARS-CoV-2 target nucleic acids are NOT DETECTED.  The SARS-CoV-2 RNA is generally detectable in upper respiratory specimens during the acute phase of infection. The lowest concentration of SARS-CoV-2 viral copies this assay can detect is 138 copies/mL. A negative result does not preclude SARS-Cov-2 infection and should not be used as the sole basis for treatment or other patient management decisions. A negative result may occur with  improper specimen collection/handling, submission of specimen other than nasopharyngeal swab, presence of viral mutation(s) within the areas targeted by this assay, and inadequate number of viral copies(<138 copies/mL). A negative result must be combined with clinical observations, patient history, and epidemiological information. The expected result is Negative.  Fact Sheet for Patients:  BloggerCourse.com  Fact Sheet for Healthcare Providers:  SeriousBroker.it  This test is no t yet approved or cleared by the [place]  FDA and  has been authorized for detection and/or diagnosis of SARS-CoV-2 by FDA under an Emergency Use Authorization (EUA). This EUA will remain  in effect (meaning this test can be used) for the duration of the COVID-19 declaration under Section 564(b)(1) of the Act, 21 U.S.C.section 360bbb-3(b)(1), unless the authorization is terminated  or revoked sooner.       Influenza A  by  PCR NEGATIVE NEGATIVE Final   Influenza B by PCR NEGATIVE NEGATIVE Final    Comment: (NOTE) The Xpert Xpress SARS-CoV-2/FLU/RSV plus assay is intended as an aid in the diagnosis of influenza from Nasopharyngeal swab specimens and should not be used as a sole basis for treatment. Nasal washings and aspirates are unacceptable for Xpert Xpress SARS-CoV-2/FLU/RSV testing.  Fact Sheet for Patients: BloggerCourse.com  Fact Sheet for Healthcare Providers: SeriousBroker.it  This test is not yet approved or cleared by the [place]  FDA and has been authorized for detection and/or diagnosis of SARS-CoV-2 by FDA under an Emergency Use Authorization (EUA). This EUA will remain in effect (meaning this test can be used) for the duration of the COVID-19 declaration under Section 564(b)(1) of the Act, 21 U.S.C. section 360bbb-3(b)(1), unless the authorization is terminated or revoked.     Resp Syncytial Virus by PCR NEGATIVE NEGATIVE Final    Comment: (NOTE) Fact Sheet for Patients: BloggerCourse.com  Fact Sheet for Healthcare Providers: SeriousBroker.it  This test is not yet approved or cleared by the [place]  FDA and has been authorized for detection and/or diagnosis of SARS-CoV-2 by FDA under an Emergency Use Authorization (EUA). This EUA will remain in effect (meaning this test can be used) for the duration of the COVID-19 declaration under Section 564(b)(1) of the Act, 21 U.S.C. section 360bbb-3(b)(1), unless the authorization is terminated or revoked.  Performed at [facility], [address] [place], [place] [address]   Blood Culture (routine x 2)     Status: None (Preliminary result)   Collection Time: 07/10/23  8:50 AM   Specimen: BLOOD  Result Value Ref Range Status   Specimen Description BLOOD RIGHT [facility]  Final   Special Requests   Final    BOTTLES DRAWN  AEROBIC AND ANAEROBIC Blood Culture results may not be optimal due to an inadequate volume of blood received in culture bottles   Culture   Final    NO GROWTH 3 DAYS Performed at [facility], [address], [place], [place] [address]    Report Status PENDING  Incomplete  Blood Culture (routine x 2)     Status: None (Preliminary result)   Collection Time: 07/10/23  8:05 PM   Specimen: BLOOD RIGHT HAND  Result Value Ref Range Status   Specimen Description BLOOD RIGHT HAND  Final   Special Requests   Final    BOTTLES DRAWN AEROBIC AND ANAEROBIC Blood Culture results may not be optimal due to an inadequate volume of blood received in culture bottles   Culture   Final    NO GROWTH 3 DAYS Performed at [facility], [address], [place], [place] [address]    Report Status PENDING  Incomplete  MRSA Next Gen by PCR, Nasal     Status: None   Collection Time: 07/10/23  8:35 PM   Specimen: Nasal Mucosa; Nasal Swab  Result Value Ref Range Status   MRSA by PCR Next Gen NOT DETECTED NOT DETECTED Final    Comment: (NOTE) The GeneXpert MRSA Assay (FDA approved for NASAL specimens only), is one component of a comprehensive MRSA colonization surveillance program. It is not intended to diagnose MRSA infection nor to guide or monitor treatment for MRSA infections. Test performance is not FDA approved in patients less than 26 years old. Performed at [facility], [address], [place], [place] [address]     Anti-infectives:  Anti-infectives (From admission, onward)    Start  Dose/Rate Route Frequency Ordered Stop   07/12/23 1200  piperacillin-tazobactam (ZOSYN) IVPB 3.375 g        3.375 g 100 mL/hr over 30 Minutes Intravenous Every 6 hours 07/12/23 1026     07/11/23 1500  vancomycin  (VANCOCIN ) IVPB 1000 mg/200 mL premix  Status:  Discontinued        1,000 mg 200 mL/hr over 60 Minutes Intravenous Every 24 hours 07/10/23 1626 07/11/23 1201   07/11/23 1500   vancomycin  (VANCOREADY) IVPB 1500 mg/300 mL        1,500 mg 150 mL/hr over 120 Minutes Intravenous Every 24 hours 07/11/23 1201     07/11/23 1000  cefTRIAXone  (ROCEPHIN ) 2 g in sodium chloride  0.9 % 100 mL IVPB  Status:  Discontinued        2 g 200 mL/hr over 30 Minutes Intravenous Every 24 hours 07/10/23 1351 07/12/23 1025   07/11/23 0800  vancomycin  variable dose per unstable renal function (pharmacist dosing)  Status:  Discontinued         Does not apply See admin instructions 07/10/23 1359 07/11/23 1201   07/10/23 1400  metroNIDAZOLE  (FLAGYL ) IVPB 500 mg  Status:  Discontinued        500 mg 100 mL/hr over 60 Minutes Intravenous Every 12 hours 07/10/23 1351 07/12/23 1026   07/10/23 1400  vancomycin  (VANCOCIN ) IVPB 1000 mg/200 mL premix        1,000 mg 200 mL/hr over 60 Minutes Intravenous  Once 07/10/23 1359 07/10/23 1518   07/10/23 0830  cefTRIAXone  (ROCEPHIN ) 2 g in sodium chloride  0.9 % 100 mL IVPB        2 g 200 mL/hr over 30 Minutes Intravenous Once 07/10/23 0818 07/10/23 0946   07/10/23 0830  vancomycin  (VANCOCIN ) IVPB 1000 mg/200 mL premix        1,000 mg 200 mL/hr over 60 Minutes Intravenous  Once 07/10/23 0818 07/10/23 1026        Consults:  Nephrology   PAST MEDICAL HISTORY   Past Medical History:  Diagnosis Date   Actinic keratosis    AICD (automatic cardioverter/defibrillator) present 03/31/2014   a. 03/2014 s/p SJM 2411-36C dual chamber AICD (serial Number [id number])- followed by Dr. Rodolfo Clan   Arthritis    a. L knee.   Asthma    Basal cell carcinoma 08/29/2008   Vertex scalp. Keratotic pattern, ulcerated.   Basal cell carcinoma 01/15/2018   Left distal medial thigh near knee. Fibroepithelioma of pinkus type   Basal cell carcinoma 01/15/2018   Right distal lat. nose ant. inferior edge. Nodular pattern.   Cardiac arrest (HCC)    a. 03/30/2014 VF arrest in setting of NICM >> CPR/defib in community >> s/p dual chamber AICD   Coronary artery disease, non-occlusive     a. cath 03/2014 at [facility]: no sig CAD (OM1 30%, CFX 30%), EF 35%   Dementia (HCC)    Dysplastic nevus 01/31/2020   L mid back paraspinal - severe, excision 03/21/2020   HLD (hyperlipidemia)    HTN (hypertension)    Kidney stones    Melanoma (HCC)    Melanoma resected from scalp   NICM (nonischemic cardiomyopathy) (HCC)    a. 03/2014 Echo:  Inferolateral and lateral HK, EF 50-55%, grade 1 diastolic dysfunction, mild MR, mild LAE, mild RAE, trivial TR, no effusion; 09/2018 Echo: EF 35-40%, impaired relaxation, glob HK, sev apical HK. Nl RV fxn. RVSP . Mildly dil LA.   Paroxysmal atrial fibrillation (HCC) 03/28/2015   Retinal artery occlusion  SURGICAL HISTORY   Past Surgical History:  Procedure Laterality Date   adenomatous polyps     CARDIAC CATHETERIZATION  03/30/2014   CARDIOVERSION  03/30/2014   COLONOSCOPY WITH PROPOFOL  N/A 03/24/2017   Procedure: COLONOSCOPY WITH PROPOFOL ;  Surgeon: Deveron Fly, MD;  Location: [facility] ENDOSCOPY;  Service: Endoscopy;  Laterality: N/A;   COLONOSCOPY WITH PROPOFOL  N/A 01/07/2019   Procedure: COLONOSCOPY WITH PROPOFOL ;  Surgeon: Toledo, Alphonsus Jeans, MD;  Location: [facility] ENDOSCOPY;  Service: Gastroenterology;  Laterality: N/A;   CYSTOSCOPY W/ LITHOLAPAXY / EHL     IMPLANTABLE CARDIOVERTER DEFIBRILLATOR IMPLANT N/A 03/31/2014   Procedure: IMPLANTABLE CARDIOVERTER DEFIBRILLATOR IMPLANT;  Surgeon: Tammie Fall, MD;  Location: [facility] CATH LAB;  Service: Cardiovascular;  Laterality: N/A;   JOINT REPLACEMENT     TONSILLECTOMY     "as a kid"   TOTAL KNEE ARTHROPLASTY Left ~ 2010   TOTAL KNEE ARTHROPLASTY WITH REVISION COMPONENTS Left ~ 2011   VARICOSE VEIN SURGERY Left ~ 2014     FAMILY HISTORY   Family History  Problem Relation Age of Onset   Esophageal cancer Mother        died at 9   Heart attack Father        died at 70   Hypertension Brother    Colon polyps Brother      SOCIAL HISTORY   Social History   Tobacco Use   Smoking  status: Never   Smokeless tobacco: Never  Vaping Use   Vaping status: Never Used  Substance Use Topics   Alcohol use: No   Drug use: No     MEDICATIONS   Current Medication:  Current Facility-Administered Medications:    ascorbic acid (VITAMIN C) tablet 500 mg, 500 mg, Oral, BID, Barack Nicodemus, MD, 500 mg at 07/12/23 1001   Chlorhexidine  Gluconate Cloth 2 % PADS 6 each, 6 each, Topical, Daily, Rust-Chester, Britton L, NP, 6 each at 07/12/23 2000   docusate sodium  (COLACE) capsule 100 mg, 100 mg, Oral, BID PRN, Huxton Glaus, MD   feeding supplement (ENSURE ENLIVE / ENSURE PLUS) liquid 237 mL, 237 mL, Oral, TID BM, Larya Charpentier, MD, 237 mL at 07/11/23 1236   heparin  ADULT infusion 100 units/mL (25000 units/250mL), 750 Units/hr, Intravenous, Continuous, Trinidad Funk, RPH, Last Rate: 7.5 mL/hr at 07/13/23 0904, 750 Units/hr at 07/13/23 0904   heparin  injection 1,000-6,000 Units, 1,000-6,000 Units, CRRT, PRN, Lateef, Munsoor, MD   hydrocortisone  sodium succinate  (SOLU-CORTEF ) 100 MG injection 100 mg, 100 mg, Intravenous, Q8H, Rust-Chester, Britton L, NP, 100 mg at 07/13/23 0507   leptospermum manuka honey (MEDIHONEY) paste 1 Application, 1 Application, Topical, Daily, Kyrstyn Greear, MD, 1 Application at 07/13/23 6045   lidocaine  (LIDODERM ) 5 % 1 patch, 1 patch, Transdermal, Q24H, Rust-Chester, Britton L, NP, 1 patch at 07/13/23 0507   liver oil-zinc  oxide (DESITIN) 40 % ointment, , Topical, BID, Zadaya Cuadra, MD, Given at 07/13/23 4098   multivitamin (RENA-VIT) tablet 1 tablet, 1 tablet, Oral, QHS, Pricila Bridge, MD   norepinephrine  (LEVOPHED ) 16 mg in (0.064 mg/mL) premix infusion, 0-40 mcg/min, Intravenous, Titrated, Rust-Chester, Britton L, NP, Last Rate: 8.44 mL/hr at 07/13/23 0900, 9 mcg/min at 07/13/23 0900   Oral care mouth rinse, 15 mL, Mouth Rinse, 4 times per day, Rust-Chester, Britton L, NP, 15 mL at 07/13/23 0746   Oral care mouth rinse, 15 mL, Mouth Rinse,  PRN, Elain Wixon, MD   piperacillin-tazobactam (ZOSYN) IVPB 3.375 g, 3.375 g, Intravenous, Q6H, Keiron Iodice, MD, Stopped  at 07/13/23 0541   pneumococcal 20-valent conjugate vaccine (PREVNAR 20) injection 0.5 mL, 0.5 mL, Intramuscular, Tomorrow-1000, Erskin Hearing, MD   prismasol  BGK 4/2.5 infusion, , CRRT, Continuous, Lateef, Munsoor, MD, Last Rate: 400 mL/hr at 07/12/23 2208, New Bag at 07/12/23 2208   prismasol  BGK 4/2.5 infusion, , CRRT, Continuous, Lateef, Munsoor, MD, Last Rate: 400 mL/hr at 07/12/23 2208, New Bag at 07/12/23 2208   prismasol  BGK 4/2.5 infusion, , CRRT, Continuous, Lateef, Munsoor, MD, Last Rate: 2,000 mL/hr at 07/13/23 0812, New Bag at 07/13/23 0812   QUEtiapine  (SEROQUEL ) tablet 25 mg, 25 mg, Oral, QHS, Ouma, Phoebe Breed, NP, 25 mg at 07/11/23 2148   sodium chloride  flush (NS) 0.9 % injection 10-40 mL, 10-40 mL, Intracatheter, Q12H, Rust-Chester, Britton L, NP, 10 mL at 07/13/23 0812   sodium chloride  flush (NS) 0.9 % injection 10-40 mL, 10-40 mL, Intracatheter, PRN, Rust-Chester, Jenni Mody L, NP   sodium phosphate 21 mmol in sodium chloride  0.9 % 250 mL infusion, 21 mmol, Intravenous, Once, Page Boast, RPH, Last Rate: 43 mL/hr at 07/13/23 0902, 21 mmol at 07/13/23 0902   thiamine  (VITAMIN B1) tablet 100 mg, 100 mg, Oral, Daily, Jmari Pelc, MD, 100 mg at 07/12/23 1002   vancomycin  (VANCOREADY) IVPB 1500 mg/300 mL, 1,500 mg, Intravenous, Q24H, Adalberto Acton, RPH, Stopped at 07/12/23 1756   vasopressin  (PITRESSIN) 20 Units in 100 mL (0.2 unit/mL) infusion-*FOR SHOCK*, 0-0.04 Units/min, Intravenous, Continuous, Rust-Chester, Britton L, NP, Last Rate: 9 mL/hr at 07/13/23 0900, 0.03 Units/min at 07/13/23 0900  Facility-Administered Medications Ordered in Other Encounters:    technetium tetrofosmin  (TC-MYOVIEW ) injection 23.3 millicurie, 23.3 millicurie, Intravenous, Once PRN, Elmyra Haggard, MD    ALLERGIES   Entresto   [sacubitril-valsartan]    REVIEW OF SYSTEMS     10 point review of systems is negative except for back pain  PHYSICAL EXAMINATION   Vital Signs: Temp:  [97.4 F (36.3 C)-98.4 F (36.9 C)] 97.8 F (36.6 C) (04/27 0800) Pulse Rate:  [53-114] 76 (04/27 0900) Resp:  [12-25] 19 (04/27 0900) BP: (89-121)/(54-100) 104/73 (04/27 0900) SpO2:  [94 %-100 %] 98 % (04/27 0900) Weight:  [83.7 kg] 83.7 kg (04/27 0530)  GENERAL: Age appropriate slow to answer with mild encephalopathy HEAD: Normocephalic, atraumatic.  EYES: Pupils equal, round, reactive to light.  No scleral icterus.  MOUTH: Moist mucosal membrane. NECK: Supple. No thyromegaly. No nodules. No JVD.  PULMONARY: Crackles at the bases bilaterally CARDIOVASCULAR: S1 and S2. Regular rate and rhythm. No murmurs, rubs, or gallops.  GASTROINTESTINAL: Soft, nontender, non-distended. No masses. Positive bowel sounds. No hepatosplenomegaly.  MUSCULOSKELETAL: No swelling, clubbing, or edema.  NEUROLOGIC: Mild distress due to acute illness no focal deficits grossly SKIN: Cellulitic lesion of the dorsal aspect of the foot with surrounding erythema and bilateral pedal edema 2+   PERTINENT DATA     Infusions:  heparin  750 Units/hr (07/13/23 0904)   norepinephrine  (LEVOPHED ) Adult infusion 9 mcg/min (07/13/23 0900)   piperacillin-tazobactam Stopped (07/13/23 0541)   prismasol  BGK 4/2.5 400 mL/hr at 07/12/23 2208   prismasol  BGK 4/2.5 400 mL/hr at 07/12/23 2208   prismasol  BGK 4/2.5 2,000 mL/hr at 07/13/23 1308   sodium PHOSPHATE IVPB (in mmol) 21 mmol (07/13/23 0902)   vancomycin  Stopped (07/12/23 1756)   vasopressin  0.03 Units/min (07/13/23 0900)   Scheduled Medications:  vitamin C  500 mg Oral BID   Chlorhexidine  Gluconate Cloth  6 each Topical Daily   feeding supplement  237 mL Oral TID BM  hydrocortisone  sod succinate (SOLU-CORTEF ) inj  100 mg Intravenous Q8H   leptospermum manuka honey  1 Application Topical Daily    lidocaine   1 patch Transdermal Q24H   liver oil-zinc  oxide   Topical BID   multivitamin  1 tablet Oral QHS   mouth rinse  15 mL Mouth Rinse 4 times per day   pneumococcal 20-valent conjugate vaccine  0.5 mL Intramuscular Tomorrow-1000   QUEtiapine   25 mg Oral QHS   sodium chloride  flush  10-40 mL Intracatheter Q12H   thiamine   100 mg Oral Daily   PRN Medications: docusate sodium , heparin , mouth rinse, sodium chloride  flush Hemodynamic parameters:   Intake/Output: 04/26 0701 - 04/27 0700 In: 2226.5 [P.O.:360; I.V.:896.8; IV Piggyback:969.7] Out: 3138 [Urine:96]  Ventilator  Settings:     LAB RESULTS:  Basic Metabolic Panel: Recent Labs  Lab 07/11/23 0417 07/11/23 1535 07/12/23 0357 07/12/23 1535 07/13/23 0317  NA 134* 137 135 135 137  K 4.5 4.9 4.1 4.2 4.2  CL 104 102 103 101 103  CO2 19* 22 22 21* 22  GLUCOSE 124* 178* 158* 146* 132*  BUN 64* 33* 20 15 13   CREATININE 1.85* 1.27* 1.18 1.02 0.99  CALCIUM  8.3* 7.5* 7.9* 8.0* 8.1*  MG 2.2  --  2.4  --  2.7*  PHOS 2.2* 2.0* 2.7 2.7 2.2*   Liver Function Tests: Recent Labs  Lab 07/10/23 0850 07/11/23 0417 07/11/23 1535 07/12/23 0357 07/12/23 1535 07/13/23 0317  AST 32  --   --   --   --   --   ALT 20  --   --   --   --   --   ALKPHOS 46  --   --   --   --   --   BILITOT 3.6*  --   --   --   --   --   PROT 6.5  --   --   --   --   --   ALBUMIN 3.5 3.0* 3.0* 2.8* 2.8* 2.5*   Recent Labs  Lab 07/10/23 1540  LIPASE 60*  AMYLASE 77   No results for input(s): "AMMONIA" in the last 168 hours. CBC: Recent Labs  Lab 07/10/23 0850 07/10/23 1540 07/11/23 0417 07/12/23 0357 07/13/23 0317  WBC 16.1* 15.6* 14.5* 17.4* 17.6*  NEUTROABS 14.1*  --   --   --   --   HGB 15.1 12.8* 13.9 14.0 13.9  HCT 44.3 36.6* 38.2* 40.0 39.4  MCV 93.9 91.7 89.7 93.5 92.1  PLT 256 234 215 226 208   Cardiac Enzymes: No results for input(s): "CKTOTAL", "CKMB", "CKMBINDEX", "TROPONINI" in the last 168 hours. BNP: Invalid  input(s): "POCBNP" CBG: Recent Labs  Lab 07/10/23 1921 07/11/23 2239 07/12/23 2007 07/13/23 0000 07/13/23 0741  GLUCAP 125* 145* 129* 136* 111*       IMAGING RESULTS:     ASSESSMENT AND PLAN    -Multidisciplinary rounds held today  Septic shock -Present on admission with etiology appearing to be either left lower extremity cellulitis or intra-abdominal infection due to prolonged diarrhea -Empiric antibiotics with zosyn -MRSA PCR -use vasopressors to keep MAP>65 -follow ABG and LA -follow up cultures -Blood and urine cultures -Central line access with CVP monitoring -consider stress dose steroids - Monitor lower extremity and consider surgical evaluation if demarcation worsens  Renal Failure-KDIGO stage 4 -Nephrology consultation- appreciate input - renal replacement therapy-starting to pull fluid  -follow chem 7 -follow UO -continue Foley Catheter-assess need daily  Chronic atrial fibrillation with non-ischemic cardiomyopathy - Status post ICD implantation  Altered mental status with encephalopathy Suspect toxic metabolic with overlying septic encephalopathy   ID -continue IV abx as prescibed -follow up cultures  GI/Nutrition GI PROPHYLAXIS as indicated DIET-->TF's as tolerated Constipation protocol as indicated  ENDO - ICU hypoglycemic\Hyperglycemia protocol -check FSBS per protocol   ELECTROLYTES -follow labs as needed -replace as needed -pharmacy consultation   DVT/GI PRX ordered -SCDs  TRANSFUSIONS AS NEEDED MONITOR FSBS ASSESS the need for LABS as needed    Critical care provider statement:   Total critical care time: 33 minutes   Performed by: Jaclynn Mast MD   Critical care time was exclusive of separately billable procedures and treating other patients.   Critical care was necessary to treat or prevent imminent or life-threatening deterioration.   Critical care was time spent personally by me on the following activities:  development of treatment plan with patient and/or surrogate as well as nursing, discussions with consultants, evaluation of patient's response to treatment, examination of patient, obtaining history from patient or surrogate, ordering and performing treatments and interventions, ordering and review of laboratory studies, ordering and review of radiographic studies, pulse oximetry and re-evaluation of patient's condition.    Reeve Turnley, M.D.  Pulmonary & Critical Care Medicine

## 2023-07-13 NOTE — Progress Notes (Signed)
 [facility]  ROUNDING NOTE   Subjective:     Patient remains oliguric at this time. Urine output only 96 cc over the preceding 24 hours. Patient awake and alert this a.m. 04/26 0701 - 04/27 0700 In: 2226.5 [P.O.:360; I.V.:896.8; IV Piggyback:969.7] Out: 3138 [Urine:96] Lab Results  Component Value Date   CREATININE 0.99 07/13/2023   CREATININE 1.02 07/12/2023   CREATININE 1.18 07/12/2023     Objective:  Vital signs in last 24 hours:  Temp:  [97.4 F (36.3 C)-98.4 F (36.9 C)] 97.8 F (36.6 C) (04/27 0800) Pulse Rate:  [53-114] 76 (04/27 0900) Resp:  [12-25] 19 (04/27 0900) BP: (89-121)/(54-100) 104/73 (04/27 0900) SpO2:  [94 %-100 %] 98 % (04/27 0900) Weight:  [83.7 kg] 83.7 kg (04/27 0530)  Weight change: -16 kg Filed Weights   07/11/23 0206 07/12/23 0407 07/13/23 0530  Weight: 99.2 kg 99.7 kg 83.7 kg    Intake/Output: I/O last 3 completed shifts: In: 2939.4 [P.O.:360; I.V.:1509.7; IV Piggyback:1069.8] Out: 4541 [Urine:176]   Intake/Output this shift:  Total I/O In: 60.6 [I.V.:60.6] Out: 165 [Urine:5]  Physical Exam: General: NAD  Head: Normocephalic, atraumatic. Moist oral mucosal membranes  Eyes: Anicteric  Lungs:  Clear to auscultation, normal effort  Heart: Regular rate and rhythm  Abdomen:  Soft, nontender  Extremities: 1+ peripheral edema.  Neurologic: Nonfocal, moving all four extremities  Skin: No lesions  Access: Rt femoral HD temp cath    Basic Metabolic Panel: Recent Labs  Lab 07/11/23 0417 07/11/23 1535 07/12/23 0357 07/12/23 1535 07/13/23 0317  NA 134* 137 135 135 137  K 4.5 4.9 4.1 4.2 4.2  CL 104 102 103 101 103  CO2 19* 22 22 21* 22  GLUCOSE 124* 178* 158* 146* 132*  BUN 64* 33* 20 15 13   CREATININE 1.85* 1.27* 1.18 1.02 0.99  CALCIUM  8.3* 7.5* 7.9* 8.0* 8.1*  MG 2.2  --  2.4  --  2.7*  PHOS 2.2* 2.0* 2.7 2.7 2.2*    Liver Function Tests: Recent Labs  Lab 07/10/23 0850 07/11/23 0417 07/11/23 1535  07/12/23 0357 07/12/23 1535 07/13/23 0317  AST 32  --   --   --   --   --   ALT 20  --   --   --   --   --   ALKPHOS 46  --   --   --   --   --   BILITOT 3.6*  --   --   --   --   --   PROT 6.5  --   --   --   --   --   ALBUMIN 3.5 3.0* 3.0* 2.8* 2.8* 2.5*   Recent Labs  Lab 07/10/23 1540  LIPASE 60*  AMYLASE 77   No results for input(s): "AMMONIA" in the last 168 hours.  CBC: Recent Labs  Lab 07/10/23 0850 07/10/23 1540 07/11/23 0417 07/12/23 0357 07/13/23 0317  WBC 16.1* 15.6* 14.5* 17.4* 17.6*  NEUTROABS 14.1*  --   --   --   --   HGB 15.1 12.8* 13.9 14.0 13.9  HCT 44.3 36.6* 38.2* 40.0 39.4  MCV 93.9 91.7 89.7 93.5 92.1  PLT 256 234 215 226 208    Cardiac Enzymes: No results for input(s): "CKTOTAL", "CKMB", "CKMBINDEX", "TROPONINI" in the last 168 hours.  BNP: Invalid input(s): "POCBNP"  CBG: Recent Labs  Lab 07/10/23 1921 07/11/23 2239 07/12/23 2007 07/13/23 0000 07/13/23 0741  GLUCAP 125* 145* 129* 136* 111*  Microbiology: Results for orders placed or performed during the hospital encounter of 07/10/23  Resp panel by RT-PCR (RSV, Flu A&B, Covid) Anterior Nasal Swab     Status: None   Collection Time: 07/10/23  8:50 AM   Specimen: Anterior Nasal Swab  Result Value Ref Range Status   SARS Coronavirus 2 by RT PCR NEGATIVE NEGATIVE Final    Comment: (NOTE) SARS-CoV-2 target nucleic acids are NOT DETECTED.  The SARS-CoV-2 RNA is generally detectable in upper respiratory specimens during the acute phase of infection. The lowest concentration of SARS-CoV-2 viral copies this assay can detect is 138 copies/mL. A negative result does not preclude SARS-Cov-2 infection and should not be used as the sole basis for treatment or other patient management decisions. A negative result may occur with  improper specimen collection/handling, submission of specimen other than nasopharyngeal swab, presence of viral mutation(s) within the areas targeted by this  assay, and inadequate number of viral copies(<138 copies/mL). A negative result must be combined with clinical observations, patient history, and epidemiological information. The expected result is Negative.  Fact Sheet for Patients:  BloggerCourse.com  Fact Sheet for Healthcare Providers:  SeriousBroker.it  This test is no t yet approved or cleared by the [place]  FDA and  has been authorized for detection and/or diagnosis of SARS-CoV-2 by FDA under an Emergency Use Authorization (EUA). This EUA will remain  in effect (meaning this test can be used) for the duration of the COVID-19 declaration under Section 564(b)(1) of the Act, 21 U.S.C.section 360bbb-3(b)(1), unless the authorization is terminated  or revoked sooner.       Influenza A by PCR NEGATIVE NEGATIVE Final   Influenza B by PCR NEGATIVE NEGATIVE Final    Comment: (NOTE) The Xpert Xpress SARS-CoV-2/FLU/RSV plus assay is intended as an aid in the diagnosis of influenza from Nasopharyngeal swab specimens and should not be used as a sole basis for treatment. Nasal washings and aspirates are unacceptable for Xpert Xpress SARS-CoV-2/FLU/RSV testing.  Fact Sheet for Patients: BloggerCourse.com  Fact Sheet for Healthcare Providers: SeriousBroker.it  This test is not yet approved or cleared by the [place]  FDA and has been authorized for detection and/or diagnosis of SARS-CoV-2 by FDA under an Emergency Use Authorization (EUA). This EUA will remain in effect (meaning this test can be used) for the duration of the COVID-19 declaration under Section 564(b)(1) of the Act, 21 U.S.C. section 360bbb-3(b)(1), unless the authorization is terminated or revoked.     Resp Syncytial Virus by PCR NEGATIVE NEGATIVE Final    Comment: (NOTE) Fact Sheet for Patients: BloggerCourse.com  Fact Sheet for  Healthcare Providers: SeriousBroker.it  This test is not yet approved or cleared by the [place]  FDA and has been authorized for detection and/or diagnosis of SARS-CoV-2 by FDA under an Emergency Use Authorization (EUA). This EUA will remain in effect (meaning this test can be used) for the duration of the COVID-19 declaration under Section 564(b)(1) of the Act, 21 U.S.C. section 360bbb-3(b)(1), unless the authorization is terminated or revoked.  Performed at [facility], [address] [place], [place] [address]   Blood Culture (routine x 2)     Status: None (Preliminary result)   Collection Time: 07/10/23  8:50 AM   Specimen: BLOOD  Result Value Ref Range Status   Specimen Description BLOOD RIGHT [facility]  Final   Special Requests   Final    BOTTLES DRAWN AEROBIC AND ANAEROBIC Blood Culture results may not be optimal due to an inadequate  volume of blood received in culture bottles   Culture   Final    NO GROWTH 3 DAYS Performed at [facility], [address] [place], [place] [address]    Report Status PENDING  Incomplete  Blood Culture (routine x 2)     Status: None (Preliminary result)   Collection Time: 07/10/23  8:05 PM   Specimen: BLOOD RIGHT HAND  Result Value Ref Range Status   Specimen Description BLOOD RIGHT HAND  Final   Special Requests   Final    BOTTLES DRAWN AEROBIC AND ANAEROBIC Blood Culture results may not be optimal due to an inadequate volume of blood received in culture bottles   Culture   Final    NO GROWTH 3 DAYS Performed at [facility], [address] [place], [place] [address]    Report Status PENDING  Incomplete  MRSA Next Gen by PCR, Nasal     Status: None   Collection Time: 07/10/23  8:35 PM   Specimen: Nasal Mucosa; Nasal Swab  Result Value Ref Range Status   MRSA by PCR Next Gen NOT DETECTED NOT DETECTED Final    Comment: (NOTE) The GeneXpert MRSA Assay (FDA approved for NASAL  specimens only), is one component of a comprehensive MRSA colonization surveillance program. It is not intended to diagnose MRSA infection nor to guide or monitor treatment for MRSA infections. Test performance is not FDA approved in patients less than 76 years old. Performed at [facility], [address] [place], [place] [address]     Coagulation Studies: Recent Labs    07/10/23 1540  LABPROT 19.9*  INR 1.7*    Urinalysis: Recent Labs    07/10/23 1120 07/10/23 1623  COLORURINE YELLOW* YELLOW*  LABSPEC 1.016 1.012  PHURINE 5.0 5.0  GLUCOSEU NEGATIVE >=500*  HGBUR NEGATIVE SMALL*  BILIRUBINUR NEGATIVE NEGATIVE  KETONESUR NEGATIVE 5*  PROTEINUR NEGATIVE NEGATIVE  NITRITE NEGATIVE NEGATIVE  LEUKOCYTESUR NEGATIVE NEGATIVE      Imaging: DG Chest Port 1 View Result Date: 07/12/2023 CLINICAL DATA:  78 year old male with respiratory failure. EXAM: PORTABLE CHEST 1 VIEW COMPARISON:  07/10/2023 and earlier. FINDINGS: Portable AP semi upright view at 0823 hours. Stable lung volumes, mediastinal contours, left chest AICD. Patchy new asymmetric, perihilar and basilar lung opacity greater on the right. Small right pleural effusion now suspected. Background increased vascular congestion. No pneumothorax. No air bronchograms. Visualized tracheal air column is within normal limits. No acute osseous abnormality identified. Negative visible bowel gas pattern. IMPRESSION: New asymmetric perihilar and basilar lung opacity greater on the right, nonspecific. Possible small right pleural effusion now. Top differential considerations are asymmetric infection, edema. Electronically Signed   By: Marlise Simpers M.D.   On: 07/12/2023 08:44     Medications:    heparin  750 Units/hr (07/13/23 0904)   norepinephrine  (LEVOPHED ) Adult infusion 9 mcg/min (07/13/23 0900)   piperacillin-tazobactam Stopped (07/13/23 0541)   prismasol  BGK 4/2.5 400 mL/hr at 07/12/23 2208   prismasol  BGK 4/2.5 400 mL/hr  at 07/12/23 2208   prismasol  BGK 4/2.5 2,000 mL/hr at 07/13/23 1308   sodium PHOSPHATE IVPB (in mmol) 21 mmol (07/13/23 0902)   vancomycin  Stopped (07/12/23 1756)   vasopressin  0.03 Units/min (07/13/23 0900)    vitamin C  500 mg Oral BID   Chlorhexidine  Gluconate Cloth  6 each Topical Daily   feeding supplement  237 mL Oral TID BM   hydrocortisone  sod succinate (SOLU-CORTEF ) inj  100 mg Intravenous Q8H   leptospermum manuka honey  1 Application Topical Daily   lidocaine   1 patch Transdermal Q24H   liver oil-zinc  oxide   Topical BID   multivitamin  1 tablet Oral QHS   mouth rinse  15 mL Mouth Rinse 4 times per day   pneumococcal 20-valent conjugate vaccine  0.5 mL Intramuscular Tomorrow-1000   QUEtiapine   25 mg Oral QHS   sodium chloride  flush  10-40 mL Intracatheter Q12H   thiamine   100 mg Oral Daily   docusate sodium , heparin , mouth rinse, sodium chloride  flush  Assessment/ Plan:  Mr. Ethan Vega. is a 78 y.o.  male  with a PMHx of hypertension, nonischemic cardiomyopathy, paroxysmal atrial fibrillation, history of ventricular tachycardia, hyperlipidemia, history of AICD placement, history of skin cancer, dementia, who was admitted to [facility] on [facility]/10/2023 for Cellulitis of left foot [L03.116] Septic shock (HCC) [A41.9, R65.21] Sepsis with acute renal failure and septic shock, due to unspecified organism, unspecified acute renal failure type (HCC) [A41.9, R65.21, N17.9] Acute renal failure superimposed on chronic kidney disease, unspecified acute renal failure type, unspecified CKD stage (HCC) [N17.9, N18.9]   Acute kidney injury/chronic kidney disease stage IIIb baseline EGFR 39 on 06/26/23/hyperkalemia.  Patient now with acute kidney injury secondary to ATN most likely.  Patient has had rather poor p.o. intake at home over the past several days prior to admission according to the daughter.  CT scan abdomen pelvis negative for obstruction.  Given severity of acute kidney injury  recommend initiation of continuous renal placement therapy.  Appreciate critical care placing right femoral temporary dialysis catheter.  Urine output only 96 cc over the preceding 24 hours.  Still on pressors.  Therefore we will continue CRRT at this time.  We hope to be able to stop CRRT once he is off pressors.  04/26 0701 - 04/27 0700 In: 2226.5 [P.O.:360; I.V.:896.8; IV Piggyback:969.7] Out: 3138 [Urine:96] Lab Results  Component Value Date   CREATININE 0.99 07/13/2023   CREATININE 1.02 07/12/2023   CREATININE 1.18 07/12/2023      2.  Acute metabolic acidosis.  Serum bicarbonate stable at 22.  Continue periodically monitor.  3.  Hypotension.  Patient remains on vasopressin  and norepinephrine  though these have been weaned down.  Hopefully he can be weaned off both today.    LOS: 3 Yarelis Ambrosino 4/27/20259:54 AM

## 2023-07-13 NOTE — Progress Notes (Signed)
 PHARMACY - ANTICOAGULATION CONSULT NOTE  Pharmacy Consult for heparin  infusion Indication: atrial fibrillation  Allergies  Allergen Reactions   Entresto  [Sacubitril-Valsartan]     hallucinations    Patient Measurements: Height: 5\' 3"  (160 cm) Weight: 99.7 kg (219 lb 12.8 oz) IBW/kg (Calculated) : 56.9 HEPARIN  DW (KG): 77  Vital Signs: Temp: 98 F (36.7 C) (04/27 0000) Temp Source: Oral (04/27 0000) BP: 111/70 (04/27 0015) Pulse Rate: 68 (04/27 0015)  Labs: Recent Labs    07/10/23 0850 07/10/23 1039 07/10/23 1540 07/10/23 1540 07/10/23 2005 07/11/23 0016 07/11/23 0417 07/11/23 1051 07/11/23 1535 07/11/23 2223 07/12/23 0357 07/12/23 0733 07/12/23 1535 07/13/23 0005  HGB 15.1  --  12.8*  --   --   --  13.9  --   --   --  14.0  --   --   --   HCT 44.3  --  36.6*  --   --   --  38.2*  --   --   --  40.0  --   --   --   PLT 256  --  234  --   --   --  215  --   --   --  226  --   --   --   APTT  --   --   --    < >  --  >200*  --  150*  --    < >  --  58* 80* 75*  LABPROT 18.7*  --  19.9*  --   --   --   --   --   --   --   --   --   --   --   INR 1.5*  --  1.7*  --   --   --   --   --   --   --   --   --   --   --   HEPARINUNFRC  --   --   --   --   --   --   --  >1.10*  --   --   --  0.66  --  0.57  CREATININE 5.58*  --  4.19*  --   --  2.71* 1.85*  --  1.27*  --  1.18  --  1.02  --   TROPONINIHS 164* 195*  --   --  199* 173*  --   --   --   --   --   --   --   --    < > = values in this interval not displayed.    Estimated Creatinine Clearance: 60.5 mL/min (by C-G formula based on SCr of 1.02 mg/dL).   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 03/2014 s/p SJM 2411-36C dual chamber AICD (serial Number [id number])- followed by Dr. Rodolfo Clan   Arthritis    a. L knee.   Asthma    Basal cell carcinoma 08/29/2008   Vertex scalp. Keratotic pattern, ulcerated.    Basal cell carcinoma 01/15/2018   Left distal medial thigh near knee. Fibroepithelioma of pinkus type   Basal cell carcinoma 01/15/2018   Right distal lat. nose ant. inferior edge. Nodular pattern.   Cardiac arrest [facility])    a. 03/30/2014 VF arrest in setting of NICM >> CPR/defib in community >> s/p dual chamber AICD   Coronary artery disease, non-occlusive  a. cath 03/2014 at [facility]: no sig CAD (OM1 30%, CFX 30%), EF 35%   Dementia (HCC)    Dysplastic nevus 01/31/2020   L mid back paraspinal - severe, excision 03/21/2020   HLD (hyperlipidemia)    HTN (hypertension)    Kidney stones    Melanoma (HCC)    Melanoma resected from scalp   NICM (nonischemic cardiomyopathy) (HCC)    a. 03/2014 Echo:  Inferolateral and lateral HK, EF 50-55%, grade 1 diastolic dysfunction, mild MR, mild LAE, mild RAE, trivial TR, no effusion; 09/2018 Echo: EF 35-40%, impaired relaxation, glob HK, sev apical HK. Nl RV fxn. RVSP . Mildly dil LA.   Paroxysmal atrial fibrillation (HCC) 03/28/2015   Retinal artery occlusion     Medications:  Scheduled:   vitamin C  500 mg Oral BID   Chlorhexidine  Gluconate Cloth  6 each Topical Daily   feeding supplement  237 mL Oral TID BM   hydrocortisone  sod succinate (SOLU-CORTEF ) inj  100 mg Intravenous Q8H   leptospermum manuka honey  1 Application Topical Daily   lidocaine   1 patch Transdermal Q24H   liver oil-zinc  oxide   Topical BID   multivitamin  1 tablet Oral QHS   mouth rinse  15 mL Mouth Rinse 4 times per day   pneumococcal 20-valent conjugate vaccine  0.5 mL Intramuscular Tomorrow-1000   QUEtiapine   25 mg Oral QHS   sodium chloride  flush  10-40 mL Intracatheter Q12H   thiamine   100 mg Oral Daily    Assessment: 78 year old male w/ PMH of HTN, nonischemic cardiomyopathy, atrial fibrillation,  ventricular tachycardia, HLD, dementia admitted with circulatory shock with  AKI. Noted to be on apixaban  PTA with last dose 07/08/23  4/26 0733 aPTT 58 heparin  level  0.66 4/26 1535 aPTT 80 4/27 0005 aPTT 75 HL 0.57 Goal of Therapy:  Anti-Xa level 0.3-0.7 units/ml once heparin  level and aPTT correlate.  aPTT 66 - 102 seconds Monitor platelets by anticoagulation protocol: Yes   Plan:  4/27 @ 0005:  aPTT = 75,  HL = 0.57 - aPTT therapeutic X 2 , now correlating with HL  - will use HL to guide dosing from here on - will continue pt on current rate and recheck HL on 4/27 @ 2200. - CBC daily  Trinitie Mcgirr D, PharmD Clinical Pharmacist  07/13/2023,12:41 AM

## 2023-07-13 NOTE — Progress Notes (Signed)
 PHARMACY - ANTICOAGULATION CONSULT NOTE  Pharmacy Consult for heparin  infusion Indication: atrial fibrillation  Allergies  Allergen Reactions   Entresto  [Sacubitril-Valsartan]     hallucinations    Patient Measurements: Height: 5\' 3"  (160 cm) Weight: 83.7 kg (184 lb 8.4 oz) IBW/kg (Calculated) : 56.9 HEPARIN  DW (KG): 77  Vital Signs: Temp: 98.8 F (37.1 C) (04/27 2000) Temp Source: Axillary (04/27 2000) BP: 97/63 (04/27 2215) Pulse Rate: 67 (04/27 2215)  Labs: Recent Labs    07/11/23 0016 07/11/23 0016 07/11/23 0417 07/11/23 1051 07/12/23 0357 07/12/23 0733 07/12/23 1535 07/13/23 0005 07/13/23 0317 07/13/23 1546 07/13/23 2204  HGB  --    < > 13.9  --  14.0  --   --   --  13.9  --   --   HCT  --   --  38.2*  --  40.0  --   --   --  39.4  --   --   PLT  --   --  215  --  226  --   --   --  208  --   --   APTT >200*  --   --    < >  --  58* 80* 75*  --   --   --   HEPARINUNFRC  --   --   --    < >  --  0.66  --  0.57  --   --  0.33  CREATININE 2.71*  --  1.85*   < > 1.18  --  1.02  --  0.99 1.06  --   TROPONINIHS 173*  --   --   --   --   --   --   --   --   --   --    < > = values in this interval not displayed.    Estimated Creatinine Clearance: 53.1 mL/min (by C-G formula based on SCr of 1.06 mg/dL).   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 03/2014 s/p SJM 2411-36C dual chamber AICD (serial Number [id number])- followed by Dr. Rodolfo Clan   Arthritis    a. L knee.   Asthma    Basal cell carcinoma 08/29/2008   Vertex scalp. Keratotic pattern, ulcerated.   Basal cell carcinoma 01/15/2018   Left distal medial thigh near knee. Fibroepithelioma of pinkus type   Basal cell carcinoma 01/15/2018   Right distal lat. nose ant. inferior edge. Nodular pattern.   Cardiac arrest (HCC)    a. 03/30/2014 VF arrest in setting of NICM >> CPR/defib in community >> s/p dual  chamber AICD   Coronary artery disease, non-occlusive    a. cath 03/2014 at [facility]: no sig CAD (OM1 30%, CFX 30%), EF 35%   Dementia (HCC)    Dysplastic nevus 01/31/2020   L mid back paraspinal - severe, excision 03/21/2020   HLD (hyperlipidemia)    HTN (hypertension)    Kidney stones    Melanoma (HCC)    Melanoma resected from scalp   NICM (nonischemic cardiomyopathy) (HCC)    a. 03/2014 Echo:  Inferolateral and lateral HK, EF 50-55%, grade 1 diastolic dysfunction, mild MR, mild LAE, mild RAE, trivial TR, no effusion; 09/2018 Echo: EF 35-40%, impaired relaxation, glob HK, sev apical HK. Nl RV fxn. RVSP . Mildly dil LA.   Paroxysmal atrial fibrillation (HCC) 03/28/2015   Retinal artery occlusion  Medications:  Scheduled:   vitamin C  500 mg Oral BID   Chlorhexidine  Gluconate Cloth  6 each Topical Daily   feeding supplement  237 mL Oral TID BM   hydrocortisone  sod succinate (SOLU-CORTEF ) inj  100 mg Intravenous Q8H   leptospermum manuka honey  1 Application Topical Daily   lidocaine   1 patch Transdermal Q24H   liver oil-zinc  oxide   Topical BID   multivitamin  1 tablet Oral QHS   mouth rinse  15 mL Mouth Rinse 4 times per day   pneumococcal 20-valent conjugate vaccine  0.5 mL Intramuscular Tomorrow-1000   QUEtiapine   25 mg Oral QHS   sodium chloride  flush  10-40 mL Intracatheter Q12H   thiamine  (VITAMIN B1) injection  100 mg Intravenous Daily    Assessment: 78 year old male w/ PMH of HTN, nonischemic cardiomyopathy, atrial fibrillation,  ventricular tachycardia, HLD, dementia admitted with circulatory shock with  AKI. Noted to be on apixaban  PTA with last dose 07/08/23  4/26 0733 aPTT 58 heparin  level 0.66 4/26 1535 aPTT 80 4/27 0005 aPTT 75 HL 0.57 4/27 2204 HL 0.33  Goal of Therapy:  Anti-Xa level 0.3-0.7 units/ml once heparin  level and aPTT correlate.  aPTT 66 - 102 seconds Monitor platelets by anticoagulation protocol: Yes   Plan:  4/27:  HL @ 2204 = 0.33,  therapeutic X 3 - will continue pt on current rate and recheck HL on 4/28 with AM labs.  - CBC daily  Bianca Raneri D, PharmD Clinical Pharmacist  07/13/2023,10:37 PM

## 2023-07-13 NOTE — Consult Note (Signed)
 PHARMACY CONSULT NOTE - ELECTROLYTES  Pharmacy Consult for Electrolyte Monitoring and Replacement   Recent Labs: Height: 5\' 3"  (160 cm) Weight: 83.7 kg (184 lb 8.4 oz) IBW/kg (Calculated) : 56.9 Estimated Creatinine Clearance: 56.9 mL/min (by C-G formula based on SCr of 0.99 mg/dL). Potassium (mmol/L)  Date Value  07/13/2023 4.2  07/06/2014 4.2   Magnesium (mg/dL)  Date Value  40/12/2723 2.7 (H)   Calcium  (mg/dL)  Date Value  36/64/4034 8.1 (L)   Calcium , Total (mg/dL)  Date Value  74/25/9563 9.1   Albumin (g/dL)  Date Value  87/56/4332 2.5 (L)  01/15/2023 4.3  03/30/2014 3.5   Phosphorus (mg/dL)  Date Value  95/18/8416 2.2 (L)   Sodium (mmol/L)  Date Value  07/13/2023 137  06/26/2023 140  07/06/2014 141   Assessment  Ethan Vega. is a 78 y.o. male presenting with septic shock. PMH significant for HTN, cardiomyopathy, Afib, and HLD. Pharmacy has been consulted to monitor and replace electrolytes.  Goal of Therapy: Electrolytes WNL  Plan:  --Phos 2.2, sodium phosphate 20 mmol IV x 1 dose --Check renal function panel BID while on CRRT   Thank you for allowing pharmacy to be a part of this patient's care.  Page Boast 07/13/2023 7:14 AM

## 2023-07-13 NOTE — Consult Note (Signed)
 PHARMACY CONSULT NOTE - ELECTROLYTES  Pharmacy Consult for Electrolyte Monitoring and Replacement   Recent Labs: Height: 5\' 3"  (160 cm) Weight: 83.7 kg (184 lb 8.4 oz) IBW/kg (Calculated) : 56.9 Estimated Creatinine Clearance: 53.1 mL/min (by C-G formula based on SCr of 1.06 mg/dL). Potassium (mmol/L)  Date Value  07/13/2023 3.8  07/06/2014 4.2   Magnesium (mg/dL)  Date Value  13/10/6576 2.7 (H)   Calcium  (mg/dL)  Date Value  46/96/2952 7.9 (L)   Calcium , Total (mg/dL)  Date Value  84/13/2440 9.1   Albumin (g/dL)  Date Value  01/12/2535 2.4 (L)  01/15/2023 4.3  03/30/2014 3.5   Phosphorus (mg/dL)  Date Value  64/40/3474 2.9   Sodium (mmol/L)  Date Value  07/13/2023 133 (L)  06/26/2023 140  07/06/2014 141   Assessment  Ethan Vega. is a 78 y.o. male presenting with septic shock. PMH significant for HTN, cardiomyopathy, Afib, and HLD. Pharmacy has been consulted to monitor and replace electrolytes.  Goal of Therapy: Electrolytes WNL  Plan:  --Give KCl 10 mEq IV x 2 to aim for K>4 given history of Afib --Check renal function panel BID while on CRRT   Thank you for allowing pharmacy to be a part of this patient's care.  Alice Innocent, PharmD Clinical Pharmacist  07/13/2023 4:30 PM

## 2023-07-13 NOTE — Progress Notes (Signed)
 Pt remains A&O x2, on 2L [place] throughout the night. Pt appeared to be resting with eyes closed in NAD throughout majority of the shift, but is easily arousable. Pt intermittently anxious throughout the night, but able to calm patient down with therapeutic communication, no need for additional medications. Able to titrate Levo and Vaso per orders (see EMAR). Pt tolerating CRRT. Minimal UOP throughout the night. Received verbal order to D/C enteric precautions from E. Ouma, NP due to patient not having a BM in >24 hours. Bed in lowest position with bed alarm on and call bell within reach.

## 2023-07-14 DIAGNOSIS — A419 Sepsis, unspecified organism: Secondary | ICD-10-CM | POA: Diagnosis not present

## 2023-07-14 DIAGNOSIS — Z7189 Other specified counseling: Secondary | ICD-10-CM | POA: Diagnosis not present

## 2023-07-14 DIAGNOSIS — N179 Acute kidney failure, unspecified: Secondary | ICD-10-CM

## 2023-07-14 DIAGNOSIS — Z789 Other specified health status: Secondary | ICD-10-CM | POA: Diagnosis not present

## 2023-07-14 DIAGNOSIS — N189 Chronic kidney disease, unspecified: Secondary | ICD-10-CM

## 2023-07-14 DIAGNOSIS — G9341 Metabolic encephalopathy: Secondary | ICD-10-CM

## 2023-07-14 DIAGNOSIS — L8990 Pressure ulcer of unspecified site, unspecified stage: Secondary | ICD-10-CM | POA: Insufficient documentation

## 2023-07-14 DIAGNOSIS — R6521 Severe sepsis with septic shock: Secondary | ICD-10-CM | POA: Diagnosis not present

## 2023-07-14 DIAGNOSIS — Z515 Encounter for palliative care: Secondary | ICD-10-CM | POA: Diagnosis not present

## 2023-07-14 DIAGNOSIS — R7989 Other specified abnormal findings of blood chemistry: Secondary | ICD-10-CM | POA: Diagnosis not present

## 2023-07-14 DIAGNOSIS — L03116 Cellulitis of left lower limb: Secondary | ICD-10-CM

## 2023-07-14 DIAGNOSIS — G928 Other toxic encephalopathy: Secondary | ICD-10-CM

## 2023-07-14 LAB — RENAL FUNCTION PANEL
Albumin: 2.3 g/dL — ABNORMAL LOW (ref 3.5–5.0)
Albumin: 2.3 g/dL — ABNORMAL LOW (ref 3.5–5.0)
Anion gap: 11 (ref 5–15)
Anion gap: 6 (ref 5–15)
BUN: 12 mg/dL (ref 8–23)
BUN: 13 mg/dL (ref 8–23)
CO2: 23 mmol/L (ref 22–32)
CO2: 24 mmol/L (ref 22–32)
Calcium: 8 mg/dL — ABNORMAL LOW (ref 8.9–10.3)
Calcium: 8.1 mg/dL — ABNORMAL LOW (ref 8.9–10.3)
Chloride: 103 mmol/L (ref 98–111)
Chloride: 106 mmol/L (ref 98–111)
Creatinine, Ser: 1.07 mg/dL (ref 0.61–1.24)
Creatinine, Ser: 1.15 mg/dL (ref 0.61–1.24)
GFR, Estimated: >60 mL/min (ref >60)
GFR, Estimated: >60 mL/min (ref >60)
Glucose, Bld: 111 mg/dL — ABNORMAL HIGH (ref 70–99)
Glucose, Bld: 127 mg/dL — ABNORMAL HIGH (ref 70–99)
Phosphorus: 1.9 mg/dL — ABNORMAL LOW (ref 2.5–4.6)
Phosphorus: 3 mg/dL (ref 2.5–4.6)
Potassium: 4.1 mmol/L (ref 3.5–5.1)
Potassium: 3.6 mmol/L (ref 3.5–5.1)
Sodium: 137 mmol/L (ref 135–145)
Sodium: 136 mmol/L (ref 135–145)

## 2023-07-14 LAB — CBC
HCT: 35.2 % — ABNORMAL LOW (ref 39.0–52.0)
Hemoglobin: 12.3 g/dL — ABNORMAL LOW (ref 13.0–17.0)
MCH: 32.1 pg (ref 26.0–34.0)
MCHC: 34.9 g/dL (ref 30.0–36.0)
MCV: 91.9 fL (ref 80.0–100.0)
Platelets: 188 10*3/uL (ref 150–400)
RBC: 3.83 MIL/uL — ABNORMAL LOW (ref 4.22–5.81)
RDW: 15.2 % (ref 11.5–15.5)
WBC: 12.6 10*3/uL — ABNORMAL HIGH (ref 4.0–10.5)
nRBC: 0.3 % — ABNORMAL HIGH (ref 0.0–0.2)

## 2023-07-14 LAB — BLOOD GAS, VENOUS
Acid-Base Excess: 3.3 mmol/L — ABNORMAL HIGH (ref 0.0–2.0)
Bicarbonate: 27.8 mmol/L (ref 20.0–28.0)
O2 Saturation: 60 %
Patient temperature: 37
pCO2, Ven: 41 mmHg — ABNORMAL LOW (ref 44–60)
pH, Ven: 7.44 — ABNORMAL HIGH (ref 7.25–7.43)
pO2, Ven: 33 mmHg (ref 32–45)

## 2023-07-14 LAB — HEPARIN LEVEL (UNFRACTIONATED)
Heparin Unfractionated: 0.29 [IU]/mL — ABNORMAL LOW (ref 0.30–0.70)
Heparin Unfractionated: 0.41 [IU]/mL (ref 0.30–0.70)
Heparin Unfractionated: 0.49 [IU]/mL (ref 0.30–0.70)

## 2023-07-14 LAB — MAGNESIUM: Magnesium: 2.5 mg/dL — ABNORMAL HIGH (ref 1.7–2.4)

## 2023-07-14 MED ORDER — VASOPRESSIN 20 UNITS/100 ML INFUSION FOR SHOCK: 0.0000 [IU]/min | INTRAVENOUS | Status: DC

## 2023-07-14 MED ORDER — HEPARIN BOLUS VIA INFUSION
1150.0000 [IU] | Freq: Once | INTRAVENOUS | Status: AC
  Administered 2023-07-14: 1150 [IU] via INTRAVENOUS
  Filled 2023-07-14: qty 1150

## 2023-07-14 MED ORDER — HYDROCORTISONE SOD SUC (PF) 100 MG IJ SOLR: 100.0000 mg | Freq: Two times a day (BID) | INTRAMUSCULAR | Status: DC

## 2023-07-14 MED ORDER — VANCOMYCIN HCL 1500 MG/300ML IV SOLN
1500.0000 mg | INTRAVENOUS | Status: AC
  Administered 2023-07-14 – 2023-07-15 (×2): 1500 mg via INTRAVENOUS
  Filled 2023-07-14 (×2): qty 300

## 2023-07-14 MED ORDER — SODIUM CHLORIDE 0.9 % IV SOLN
30.0000 mmol | Freq: Once | INTRAVENOUS | Status: AC
  Administered 2023-07-14: 30 mmol via INTRAVENOUS
  Filled 2023-07-14: qty 10

## 2023-07-14 MED ORDER — POTASSIUM CHLORIDE 10 MEQ/100ML IV SOLN
10.0000 meq | INTRAVENOUS | Status: AC
  Administered 2023-07-14 (×4): 10 meq via INTRAVENOUS
  Filled 2023-07-14 (×4): qty 100

## 2023-07-14 MED ORDER — LEVOTHYROXINE SODIUM 50 MCG PO TABS
75.0000 ug | ORAL_TABLET | Freq: Every day | ORAL | Status: DC
  Administered 2023-07-15 – 2023-08-01 (×17): 75 ug via ORAL
  Filled 2023-07-14 (×2): qty 1
  Filled 2023-07-14: qty 2
  Filled 2023-07-14 (×7): qty 1
  Filled 2023-07-14: qty 2
  Filled 2023-07-14 (×3): qty 1
  Filled 2023-07-14: qty 2
  Filled 2023-07-14 (×2): qty 1

## 2023-07-14 MED ORDER — VASOPRESSIN 20 UNIT/ML IV SOLN
0.0000 mL/h | INTRAVENOUS | Status: AC
  Administered 2023-07-14: 9 mL/h via INTRAVENOUS
  Filled 2023-07-14 (×3): qty 100

## 2023-07-14 NOTE — Progress Notes (Signed)
 [facility]  ROUNDING NOTE   Subjective:     Patient maintained on CRRT. Earlier in the a.m. was still on norepinephrine . Remains oliguric at this time. Daughter was at the bedside this a.m. 04/27 0701 - 04/28 0700 In: 1717.2 [P.O.:100; I.V.:546.9; IV Piggyback:1070.4] Out: 2980 [Urine:90] Lab Results  Component Value Date   CREATININE 1.15 07/14/2023   CREATININE 1.07 07/14/2023   CREATININE 1.06 07/13/2023     Objective:  Vital signs in last 24 hours:  Temp:  [97.7 F (36.5 C)-99 F (37.2 C)] 98.7 F (37.1 C) (04/28 1600) Pulse Rate:  [35-97] 90 (04/28 1600) Resp:  [13-23] 21 (04/28 1600) BP: (75-123)/(39-83) 107/61 (04/28 1600) SpO2:  [93 %-100 %] 97 % (04/28 1600) Weight:  [85.1 kg] 85.1 kg (04/28 0320)  Weight change: 1.4 kg Filed Weights   07/12/23 0407 07/13/23 0530 07/14/23 0320  Weight: 99.7 kg 83.7 kg 85.1 kg    Intake/Output: I/O last 3 completed shifts: In: 2189 [P.O.:100; I.V.:912.3; IV Piggyback:1176.7] Out: 3728 [Urine:136]   Intake/Output this shift:  Total I/O In: 685.6 [P.O.:170; I.V.:130.5; IV Piggyback:385] Out: 1058 [Urine:35]  Physical Exam: General: NAD  Head: Normocephalic, atraumatic. Moist oral mucosal membranes  Eyes: Anicteric  Lungs:  Clear to auscultation, normal effort  Heart: Regular rate and rhythm  Abdomen:  Soft, nontender  Extremities: 1+ peripheral edema.  Neurologic: Awake, alert, conversant  Skin: No lesions  Access: Rt femoral HD temp cath    Basic Metabolic Panel: Recent Labs  Lab 07/11/23 0417 07/11/23 1535 07/12/23 0357 07/12/23 1535 07/13/23 0317 07/13/23 1546 07/14/23 0409 07/14/23 1527  NA 134*   < > 135 135 137 133* 137 136  K 4.5   < > 4.1 4.2 4.2 3.8 4.1 3.6  CL 104   < > 103 101 103 104 103 106  CO2 19*   < > 22 21* 22 24 23 24   GLUCOSE 124*   < > 158* 146* 132* 129* 111* 127*  BUN 64*   < > 20 15 13 11 12 13   CREATININE 1.85*   < > 1.18 1.02 0.99 1.06 1.07 1.15  CALCIUM   8.3*   < > 7.9* 8.0* 8.1* 7.9* 8.0* 8.1*  MG 2.2  --  2.4  --  2.7*  --  2.5*  --   PHOS 2.2*   < > 2.7 2.7 2.2* 2.9 1.9* 3.0   < > = values in this interval not displayed.    Liver Function Tests: Recent Labs  Lab 07/10/23 0850 07/11/23 0417 07/12/23 1535 07/13/23 0317 07/13/23 1546 07/14/23 0409 07/14/23 1527  AST 32  --   --   --   --   --   --   ALT 20  --   --   --   --   --   --   ALKPHOS 46  --   --   --   --   --   --   BILITOT 3.6*  --   --   --   --   --   --   PROT 6.5  --   --   --   --   --   --   ALBUMIN 3.5   < > 2.8* 2.5* 2.4* 2.3* 2.3*   < > = values in this interval not displayed.   Recent Labs  Lab 07/10/23 1540  LIPASE 60*  AMYLASE 77   No results for input(s): "AMMONIA" in the last 168 hours.  CBC: Recent Labs  Lab 07/10/23 0850 07/10/23 1540 07/11/23 0417 07/12/23 0357 07/13/23 0317 07/14/23 0409  WBC 16.1* 15.6* 14.5* 17.4* 17.6* 12.6*  NEUTROABS 14.1*  --   --   --   --   --   HGB 15.1 12.8* 13.9 14.0 13.9 12.3*  HCT 44.3 36.6* 38.2* 40.0 39.4 35.2*  MCV 93.9 91.7 89.7 93.5 92.1 91.9  PLT 256 234 215 226 208 188    Cardiac Enzymes: No results for input(s): "CKTOTAL", "CKMB", "CKMBINDEX", "TROPONINI" in the last 168 hours.  BNP: Invalid input(s): "POCBNP"  CBG: Recent Labs  Lab 07/10/23 1921 07/11/23 2239 07/12/23 2007 07/13/23 0000 07/13/23 0741  GLUCAP 125* 145* 129* 136* 111*    Microbiology: Results for orders placed or performed during the hospital encounter of 07/10/23  Resp panel by RT-PCR (RSV, Flu A&B, Covid) Anterior Nasal Swab     Status: None   Collection Time: 07/10/23  8:50 AM   Specimen: Anterior Nasal Swab  Result Value Ref Range Status   SARS Coronavirus 2 by RT PCR NEGATIVE NEGATIVE Final    Comment: (NOTE) SARS-CoV-2 target nucleic acids are NOT DETECTED.  The SARS-CoV-2 RNA is generally detectable in upper respiratory specimens during the acute phase of infection. The lowest concentration of  SARS-CoV-2 viral copies this assay can detect is 138 copies/mL. A negative result does not preclude SARS-Cov-2 infection and should not be used as the sole basis for treatment or other patient management decisions. A negative result may occur with  improper specimen collection/handling, submission of specimen other than nasopharyngeal swab, presence of viral mutation(s) within the areas targeted by this assay, and inadequate number of viral copies(<138 copies/mL). A negative result must be combined with clinical observations, patient history, and epidemiological information. The expected result is Negative.  Fact Sheet for Patients:  BloggerCourse.com  Fact Sheet for Healthcare Providers:  SeriousBroker.it  This test is no t yet approved or cleared by the [place]  FDA and  has been authorized for detection and/or diagnosis of SARS-CoV-2 by FDA under an Emergency Use Authorization (EUA). This EUA will remain  in effect (meaning this test can be used) for the duration of the COVID-19 declaration under Section 564(b)(1) of the Act, 21 U.S.C.section 360bbb-3(b)(1), unless the authorization is terminated  or revoked sooner.       Influenza A by PCR NEGATIVE NEGATIVE Final   Influenza B by PCR NEGATIVE NEGATIVE Final    Comment: (NOTE) The Xpert Xpress SARS-CoV-2/FLU/RSV plus assay is intended as an aid in the diagnosis of influenza from Nasopharyngeal swab specimens and should not be used as a sole basis for treatment. Nasal washings and aspirates are unacceptable for Xpert Xpress SARS-CoV-2/FLU/RSV testing.  Fact Sheet for Patients: BloggerCourse.com  Fact Sheet for Healthcare Providers: SeriousBroker.it  This test is not yet approved or cleared by the [place]  FDA and has been authorized for detection and/or diagnosis of SARS-CoV-2 by FDA under an Emergency Use  Authorization (EUA). This EUA will remain in effect (meaning this test can be used) for the duration of the COVID-19 declaration under Section 564(b)(1) of the Act, 21 U.S.C. section 360bbb-3(b)(1), unless the authorization is terminated or revoked.     Resp Syncytial Virus by PCR NEGATIVE NEGATIVE Final    Comment: (NOTE) Fact Sheet for Patients: BloggerCourse.com  Fact Sheet for Healthcare Providers: SeriousBroker.it  This test is not yet approved or cleared by the [place]  FDA and has been authorized for detection and/or diagnosis of SARS-CoV-2 by  FDA under an Emergency Use Authorization (EUA). This EUA will remain in effect (meaning this test can be used) for the duration of the COVID-19 declaration under Section 564(b)(1) of the Act, 21 U.S.C. section 360bbb-3(b)(1), unless the authorization is terminated or revoked.  Performed at [facility], [address] [place], [place] [address]   Blood Culture (routine x 2)     Status: None (Preliminary result)   Collection Time: 07/10/23  8:50 AM   Specimen: BLOOD  Result Value Ref Range Status   Specimen Description BLOOD RIGHT [facility]  Final   Special Requests   Final    BOTTLES DRAWN AEROBIC AND ANAEROBIC Blood Culture results may not be optimal due to an inadequate volume of blood received in culture bottles   Culture   Final    NO GROWTH 4 DAYS Performed at [facility], [address], [place], [place] [address]    Report Status PENDING  Incomplete  Blood Culture (routine x 2)     Status: None (Preliminary result)   Collection Time: 07/10/23  8:05 PM   Specimen: BLOOD RIGHT HAND  Result Value Ref Range Status   Specimen Description BLOOD RIGHT HAND  Final   Special Requests   Final    BOTTLES DRAWN AEROBIC AND ANAEROBIC Blood Culture results may not be optimal due to an inadequate volume of blood received in culture bottles   Culture   Final    NO  GROWTH 4 DAYS Performed at [facility], [address], [place], [place] [address]    Report Status PENDING  Incomplete  MRSA Next Gen by PCR, Nasal     Status: None   Collection Time: 07/10/23  8:35 PM   Specimen: Nasal Mucosa; Nasal Swab  Result Value Ref Range Status   MRSA by PCR Next Gen NOT DETECTED NOT DETECTED Final    Comment: (NOTE) The GeneXpert MRSA Assay (FDA approved for NASAL specimens only), is one component of a comprehensive MRSA colonization surveillance program. It is not intended to diagnose MRSA infection nor to guide or monitor treatment for MRSA infections. Test performance is not FDA approved in patients less than 75 years old. Performed at [facility], [address] [place], [place] [address]     Coagulation Studies: No results for input(s): "LABPROT", "INR" in the last 72 hours.   Urinalysis: No results for input(s): "COLORURINE", "LABSPEC", "PHURINE", "GLUCOSEU", "HGBUR", "BILIRUBINUR", "KETONESUR", "PROTEINUR", "UROBILINOGEN", "NITRITE", "LEUKOCYTESUR" in the last 72 hours.  Invalid input(s): "APPERANCEUR"     Imaging: No results found.    Medications:    heparin  900 Units/hr (07/14/23 1700)   norepinephrine  (LEVOPHED ) Adult infusion 4 mcg/min (07/14/23 1700)   piperacillin-tazobactam Stopped (07/14/23 1246)   potassium chloride      prismasol  BGK 4/2.5 400 mL/hr at 07/14/23 1210   prismasol  BGK 4/2.5 400 mL/hr at 07/14/23 1210   prismasol  BGK 4/2.5 2,000 mL/hr at 07/14/23 1659   sodium chloride  0.9 % 100 mL with vasopressin  (PITRESSIN) 20 Units infusion Stopped (07/14/23 0750)   vancomycin  150 mL/hr at 07/14/23 1700    vitamin C  500 mg Oral BID   Chlorhexidine  Gluconate Cloth  6 each Topical Daily   feeding supplement  237 mL Oral TID BM   [START ON 07/15/2023] hydrocortisone  sod succinate (SOLU-CORTEF ) inj  100 mg Intravenous Q12H   leptospermum manuka honey  1 Application Topical Daily   [START ON 07/15/2023]  levothyroxine   75 mcg Oral Q0600   lidocaine   1  patch Transdermal Q24H   liver oil-zinc  oxide   Topical BID   multivitamin  1 tablet Oral QHS   mouth rinse  15 mL Mouth Rinse 4 times per day   pneumococcal 20-valent conjugate vaccine  0.5 mL Intramuscular Tomorrow-1000   sodium chloride  flush  10-40 mL Intracatheter Q12H   thiamine  (VITAMIN B1) injection  100 mg Intravenous Daily   docusate sodium , heparin , mouth rinse, sodium chloride  flush  Assessment/ Plan:  Mr. Ethan Vega. is a 78 y.o.  male  with a PMHx of hypertension, nonischemic cardiomyopathy, paroxysmal atrial fibrillation, history of ventricular tachycardia, hyperlipidemia, history of AICD placement, history of skin cancer, dementia, who was admitted to [facility] on 07/10/2023 for Cellulitis of left foot [L03.116] Septic shock (HCC) [A41.9, R65.21] Sepsis with acute renal failure and septic shock, due to unspecified organism, unspecified acute renal failure type (HCC) [A41.9, R65.21, N17.9] Acute renal failure superimposed on chronic kidney disease, unspecified acute renal failure type, unspecified CKD stage (HCC) [N17.9, N18.9]   Acute kidney injury/chronic kidney disease stage IIIb baseline EGFR 39 on 06/26/23/hyperkalemia.  Patient now with acute kidney injury secondary to ATN most likely.  Patient has had rather poor p.o. intake at home over the past several days prior to admission according to the daughter.  CT scan abdomen pelvis negative for obstruction.  Given severity of acute kidney injury recommend initiation of continuous renal placement therapy.  Appreciate critical care placing right femoral temporary dialysis catheter.  Urine output remains low at 90 cc over the preceding 24 hours.  Creatinine currently 1.1 under the influence of CRRT.  Maintain the patient on CRRT with current parameters for now.  We will consider switching him to intermittent hemodialysis once he is off pressors.  04/27 0701 - 04/28 0700 In: 1717.2  [P.O.:100; I.V.:546.9; IV Piggyback:1070.4] Out: 2980 [Urine:90] Lab Results  Component Value Date   CREATININE 1.15 07/14/2023   CREATININE 1.07 07/14/2023   CREATININE 1.06 07/13/2023      2.  Acute metabolic acidosis.  Serum bicarbonate 24 and acceptable.  3.  Hypotension.  Weaned off of vasopressin  but still requiring low-dose norepinephrine .  Hopefully he can be weaned off of this today.    LOS: 4 Ethan Vega 4/28/20255:21 PM

## 2023-07-14 NOTE — TOC Initial Note (Signed)
 Transition of Care (TOC) - Initial/Assessment Note    Patient Details  Name: Ethan Vega. MRN: [id number] Date of Birth: 06/04/1945  Transition of Care [facility]) CM/SW Contact:    Derrill Bagnell A Semisi Biela, RN Phone Number: 07/14/2023, 3:24 PM  Clinical Narrative:                 Chart reviewed.  Noted that patient was admitted with Septic Shock.  Patient is requiring CRRT.  Patient remains on Levophed  drip.    I have spoken with Mrs. [name].  Mrs. Neer reports that prior to admission patient lived at home by himself.  Mrs. Crilly reports that patient was not doing very well at home.  She reports that patient did have a walker at home but did use the walker but really needed too.  She reports that patient was struggling with basic hygiene.  Mrs. Hartshorn informs me that she would buy food for her father to eat and have in the home to warm up in the microwave.  She reports that she transports her father into appointments and takes him to the grocery store.    I have discussed short term rehab v/s long term rehab.  I have explained the process to Mrs. Balogh.  She feels like patient will need to a least receive short term rehab on discharge.  I have also discussed the ways that Short-term rehab and long-term rehab are covered.  She would like for rehab placement to be looked in [place] area once patient is medically stable.    Currently patient still requiring CRRT and will continue to follow to see if patient will require hemo-dialysis on discharge.    TOC will continue to follow for discharge planning.       Expected Discharge Plan:  (TBD)     Patient Goals and CMS Choice            Expected Discharge Plan and Services   Discharge Planning Services: CM Consult   Living arrangements for the past 2 months: Single Family Home                                      Prior Living Arrangements/Services Living arrangements for the past 2 months: Single Family  Home Lives with:: Self Patient language and need for interpreter reviewed:: Yes          Care giver support system in place?: Yes (comment) (Patient has a supportive daughter)      Activities of Daily Living   ADL Screening (condition at time of admission) Independently performs ADLs?: No Does the patient have a NEW difficulty with bathing/dressing/toileting/self-feeding that is expected to last >3 days?: No Does the patient have a NEW difficulty with getting in/out of bed, walking, or climbing stairs that is expected to last >3 days?: Yes (Initiates electronic notice to provider for possible PT consult) Does the patient have a NEW difficulty with communication that is expected to last >3 days?: No Is the patient deaf or have difficulty hearing?: Yes Does the patient have difficulty seeing, even when wearing glasses/contacts?: No Does the patient have difficulty concentrating, remembering, or making decisions?: Yes  Permission Sought/Granted                  Emotional Assessment Appearance:: Appears stated age     Orientation: : Oriented to Self      Admission diagnosis:  Cellulitis of left foot [L03.116] Septic shock (HCC) [A41.9, R65.21] Sepsis with acute renal failure and septic shock, due to unspecified organism, unspecified acute renal failure type (HCC) [A41.9, R65.21, N17.9] Acute renal failure superimposed on chronic kidney disease, unspecified acute renal failure type, unspecified CKD stage (HCC) [N17.9, N18.9] Patient Active Problem List   Diagnosis Date Noted   Septic shock (HCC) 07/10/2023   Mixed hyperlipidemia 10/09/2022   Acquired hypothyroidism 10/09/2022   Ventricular tachycardia (HCC) 02/16/2017   Paroxysmal atrial fibrillation (HCC) 03/28/2015   Leg swelling 07/27/2014   NICM (nonischemic cardiomyopathy) (HCC) 04/03/2014   HTN (hypertension) 04/03/2014   S/P implantation of automatic cardioverter/defibrillator (AICD) 04/03/2014   Prolonged Q-T  interval on ECG 04/03/2014   Encounter for monitoring amiodarone  therapy 04/03/2014   Sinus node dysfunction/bradycardia 04/01/2014   Elevated transaminase level 04/01/2014   Cardiac arrest (HCC) 03/30/2014   PCP:  Shari Daughters, MD Pharmacy:   [facility] [phone] [place], [place] - [address] [place] [address] Phone: [phone] Fax: [phone]     Social Drivers of Health (SDOH) Social History: SDOH Screenings   Food Insecurity: Patient Unable To Answer (07/10/2023)  Housing: Patient Unable To Answer (07/10/2023)  Transportation Needs: Patient Unable To Answer (07/10/2023)  Utilities: Patient Unable To Answer (07/10/2023)  Depression (PHQ2-9): Low Risk  (01/15/2023)  Social Connections: Patient Unable To Answer (07/10/2023)  Tobacco Use: Low Risk  (07/10/2023)   SDOH Interventions:     Readmission Risk Interventions     No data to display

## 2023-07-14 NOTE — Progress Notes (Signed)
 NAME:  Ethan Vega., MRN:  [id number], DOB:  [birth year], LOS: 4 ADMISSION DATE:  07/10/2023, CONSULTATION DATE:  07/10/2023 REFERRING MD:  Dr. Alejo Amsler, CHIEF COMPLAINT:  Hypotension, AMS   Brief Pt Description / Synopsis:  78 y.o. male, DNR/DNI, admitted with Acute Metabolic Encephalopathy and Severe Sepsis with Septic Shock due to Left Lower Extremity Cellulitis, along with AKI requiring intiation of CRRT.  History of Present Illness:  Ethan Vega is a 78 year old male with a history of essential hypertension, nonischemic cardiomyopathy history of paroxysmal A-fib and ventricular tachycardia, dyslipidemia who was seen earlier today with electrophysiology service to have ICD generator change however he was noted to have open sores on his left lower extremity and was mentating with confusion and encephalopathy with decision to abort procedure and reschedule. He was subsequently sent to the ER. His workup revealed severe AKI and leukocytosis and vitals with shock physiology. Patient received IV fluid resuscitation but despite 3 L of intravenous fluids continues to require Levophed  support. He reports diarrhea over the past week. Patient currently lives alone in his home and was previously able to perform all activities of daily living. He is unaware of the open score and cellulitic lesion of his left foot but does admit to chronic lower extremity pitting edema. ICU admission requested for circulatory shock with severe AKI.   Please see "Significant Hospital Events" section below for full detailed hospital course.   Pertinent  Medical History   Past Medical History:  Diagnosis Date   Actinic keratosis    AICD (automatic cardioverter/defibrillator) present 03/31/2014   a. 03/2014 s/p SJM 2411-36C dual chamber AICD (serial Number [id number])- followed by Dr. Rodolfo Clan   Arthritis    a. L knee.   Asthma    Basal cell carcinoma 08/29/2008   Vertex scalp. Keratotic pattern, ulcerated.   Basal cell  carcinoma 01/15/2018   Left distal medial thigh near knee. Fibroepithelioma of pinkus type   Basal cell carcinoma 01/15/2018   Right distal lat. nose ant. inferior edge. Nodular pattern.   Cardiac arrest (HCC)    a. 03/30/2014 VF arrest in setting of NICM >> CPR/defib in community >> s/p dual chamber AICD   Coronary artery disease, non-occlusive    a. cath 03/2014 at [facility]: no sig CAD (OM1 30%, CFX 30%), EF 35%   Dementia (HCC)    Dysplastic nevus 01/31/2020   L mid back paraspinal - severe, excision 03/21/2020   HLD (hyperlipidemia)    HTN (hypertension)    Kidney stones    Melanoma (HCC)    Melanoma resected from scalp   NICM (nonischemic cardiomyopathy) (HCC)    a. 03/2014 Echo:  Inferolateral and lateral HK, EF 50-55%, grade 1 diastolic dysfunction, mild MR, mild LAE, mild RAE, trivial TR, no effusion; 09/2018 Echo: EF 35-40%, impaired relaxation, glob HK, sev apical HK. Nl RV fxn. RVSP . Mildly dil LA.   Paroxysmal atrial fibrillation (HCC) 03/28/2015   Retinal artery occlusion     Micro Data:  4/24: SARS-CoV-2/flu/RSV PCR>> negative 4/24: Blood cultures x 2>> no growth to date 4/24: MRSA PCR>> negative  Antimicrobials:   Anti-infectives (From admission, onward)    Start     Dose/Rate Route Frequency Ordered Stop   07/14/23 1600  vancomycin  (VANCOREADY) IVPB 1500 mg/300 mL        1,500 mg 150 mL/hr over 120 Minutes Intravenous Every 24 hours 07/14/23 1349 07/17/23 1559   07/14/23 1500  vancomycin  (VANCOREADY) IVPB 2000 mg/400 mL  Status:  Discontinued  2,000 mg 200 mL/hr over 120 Minutes Intravenous Every 24 hours 07/13/23 1500 07/13/23 1501   07/13/23 1600  vancomycin  (VANCOREADY) IVPB 2000 mg/400 mL  Status:  Discontinued        2,000 mg 200 mL/hr over 120 Minutes Intravenous Every 24 hours 07/13/23 1501 07/14/23 1349   07/12/23 1200  piperacillin-tazobactam (ZOSYN) IVPB 3.375 g        3.375 g 100 mL/hr over 30 Minutes Intravenous Every 6 hours 07/12/23 1026  07/16/23 2359   07/11/23 1500  vancomycin  (VANCOCIN ) IVPB 1000 mg/200 mL premix  Status:  Discontinued        1,000 mg 200 mL/hr over 60 Minutes Intravenous Every 24 hours 07/10/23 1626 07/11/23 1201   07/11/23 1500  vancomycin  (VANCOREADY) IVPB 1500 mg/300 mL  Status:  Discontinued        1,500 mg 150 mL/hr over 120 Minutes Intravenous Every 24 hours 07/11/23 1201 07/13/23 1500   07/11/23 1000  cefTRIAXone  (ROCEPHIN ) 2 g in sodium chloride  0.9 % 100 mL IVPB  Status:  Discontinued        2 g 200 mL/hr over 30 Minutes Intravenous Every 24 hours 07/10/23 1351 07/12/23 1025   07/11/23 0800  vancomycin  variable dose per unstable renal function (pharmacist dosing)  Status:  Discontinued         Does not apply See admin instructions 07/10/23 1359 07/11/23 1201   07/10/23 1400  metroNIDAZOLE  (FLAGYL ) IVPB 500 mg  Status:  Discontinued        500 mg 100 mL/hr over 60 Minutes Intravenous Every 12 hours 07/10/23 1351 07/12/23 1026   07/10/23 1400  vancomycin  (VANCOCIN ) IVPB 1000 mg/200 mL premix        1,000 mg 200 mL/hr over 60 Minutes Intravenous  Once 07/10/23 1359 07/10/23 1518   07/10/23 0830  cefTRIAXone  (ROCEPHIN ) 2 g in sodium chloride  0.9 % 100 mL IVPB        2 g 200 mL/hr over 30 Minutes Intravenous Once 07/10/23 0818 07/10/23 0946   07/10/23 0830  vancomycin  (VANCOCIN ) IVPB 1000 mg/200 mL premix        1,000 mg 200 mL/hr over 60 Minutes Intravenous  Once 07/10/23 0818 07/10/23 1026       Significant Hospital Events: Including procedures, antibiotic start and stop dates in addition to other pertinent events   07/10/23- Presents to ED, PCCM asked to admit.  Required placement of Trialysis catheter and initiation of CRRT. 07/11/23- patient s/p CRRT overnight, does have sacral decub stage 2 present on arrival. On Rovephin,flagyl , vancomycin .  LLL cellulitic lesion without exudate and no osteo per imaging.  Remains on levophed  and vasopressin .  Startign renal diet via OGT. 07/12/23- patient  seen at bedside, daughter is at bedside.  We discussed goals of care and ordered consultation with palliative care.  He is oliguric, remains on high dose 2 vasopressor req, prognosis is guarded and we are considering.  07/13/23- patient with mild improvement from 30 to 8 on levophed  dose, CRRT -50/h with olgiuric AKI, mentation improved, still overall poor prognosis long term and is working with Palliative care specialist. 07/14/23-no significant events overnight.  Remains on CRRT, oliguric with urine output of 90 cc over the past 24 hours (+285 cc).  Vasopressin  weaned off, on 3 mcg Levophed , will decrease solucortef to 100 mg BID.  Mental status slowly improving.   Interim History / Subjective:  As outlined above under "Significant Hospital Events" section  Objective   Blood pressure 99/60, pulse 64, temperature  99 F (37.2 C), resp. rate 17, height 5\' 3"  (1.6 m), weight 85.1 kg, SpO2 96%.        Intake/Output Summary (Last 24 hours) at 07/14/2023 0827 Last data filed at 07/14/2023 0800 Gross per 24 hour  Intake 1703.01 ml  Output 2980 ml  Net -1276.99 ml   Filed Weights   07/12/23 0407 07/13/23 0530 07/14/23 0320  Weight: 99.7 kg 83.7 kg 85.1 kg    Examination: General: Acute on Chronically ill-appearing male, sitting in bed, on room air, no acute distress HENT: Atraumatic, normocephalic, neck supple, no JVD Lungs: Clear breath sounds throughout, even, nonlabored, normal effort Cardiovascular: Paced rhythm, S1-S2, no murmurs, rubs, gallops Abdomen: Obese, soft, nontender, nondistended, no guarding or tenderness, sounds positive x 4 Extremities: Generalized weakness, normal bulk and tone, trace edema bilateral lower extremities, cellulitis and wound to left foot Neuro: Awake and alert, oriented to person and place.  Moves all extremities to commands, no focal deficits, speech clear, pupils PERRLA GU: Foley catheter in place draining small amount of yellow urine  Resolved Hospital  Problem list     Assessment & Plan:   #Septic Shock #Chronic Atrial Fibrillation #Mildly Elevated Troponin in setting of demand ischemia PMHx: HTN, nonischemic cardiomyopathy, Paroxsymal atrial fibrillation, V. Tach, HLD Echocardiogram 07/08/23: LVEF 30 to 35%, indeterminate diastolic parameters, RV systolic function normal, RV size is normal -Continuous cardiac monitoring -Maintain MAP >65 -Cautious IV fluids -Vasopressors as needed to maintain MAP goal -Decrease Solu-cortef  to 100 mg BID on 4/28 -Lactic acid has normalized -HS Troponin peaked at 199 -Diuresis as BP and renal function permits ~ holding due to shock -Continue Heparin  gtt for Anticoagulation  #Severe Sepsis due to ... #Left Lower extremity Cellulitis CT Left leg and foot with Diffuse SQ edema extending along the mid to distal left lower extremity through the forefoot, concerning for cellulitis.  No evidence for loculated fluid collections or acute osteolysis or erosive changes. -Monitor fever curve -Trend WBC's & Procalcitonin -Follow cultures as above -Continue empiric Vancomycin  and Zosyn pending cultures & sensitivities (plan for 7 day course)  #AKI  -Monitor I&O's / urinary output -Follow BMP -Ensure adequate renal perfusion -Avoid nephrotoxic agents as able -Replace electrolytes as indicated ~ Pharmacy following for assistance with electrolyte replacement -Nephrology following, appreciate input ~ continue CRRT as per Nephrology  #Acute Metabolic Encephalopathy PMHx: Dementia -Treatment of sepsis and metabolic derangements as outlined above -Provide supportive care -Promote normal sleep/wake cycle and family presence -Avoid sedating medications as able       Pt is critically ill with multiorgan failure. Prognosis is guarded, high risk for further decompensation, cardiac arrest, and death.  Given current critical illness superimposed on multiple chronic co-morbidities and advanced age, overall long  term prognosis is poor.  Pt is DNR/DNI status.  Palliative Care is following to assist with GOC discussions.   Best Practice (right click and "Reselect all SmartList Selections" daily)   Diet/type: Regular consistency (see orders) DVT prophylaxis: systemic heparin  GI prophylaxis: N/A Lines: Dialysis Catheter and yes and it is still needed Foley:  Yes, and it is still needed Code Status:  DNR Last date of multidisciplinary goals of care discussion [4/28]  4/2: Pt and family (his daughter and his ex-wife) updated at bedside on plan of care.  Labs   CBC: Recent Labs  Lab 07/10/23 0850 07/10/23 1540 07/11/23 0417 07/12/23 0357 07/13/23 0317 07/14/23 0409  WBC 16.1* 15.6* 14.5* 17.4* 17.6* 12.6*  NEUTROABS 14.1*  --   --   --   --   --  HGB 15.1 12.8* 13.9 14.0 13.9 12.3*  HCT 44.3 36.6* 38.2* 40.0 39.4 35.2*  MCV 93.9 91.7 89.7 93.5 92.1 91.9  PLT 256 234 215 226 208 188    Basic Metabolic Panel: Recent Labs  Lab 07/11/23 0417 07/11/23 1535 07/12/23 0357 07/12/23 1535 07/13/23 0317 07/13/23 1546 07/14/23 0409  NA 134*   < > 135 135 137 133* 137  K 4.5   < > 4.1 4.2 4.2 3.8 4.1  CL 104   < > 103 101 103 104 103  CO2 19*   < > 22 21* 22 24 23   GLUCOSE 124*   < > 158* 146* 132* 129* 111*  BUN 64*   < > 20 15 13 11 12   CREATININE 1.85*   < > 1.18 1.02 0.99 1.06 1.07  CALCIUM  8.3*   < > 7.9* 8.0* 8.1* 7.9* 8.0*  MG 2.2  --  2.4  --  2.7*  --  2.5*  PHOS 2.2*   < > 2.7 2.7 2.2* 2.9 1.9*   < > = values in this interval not displayed.   GFR: Estimated Creatinine Clearance: 53.1 mL/min (by C-G formula based on SCr of 1.07 mg/dL). Recent Labs  Lab 07/10/23 0850 07/10/23 1039 07/10/23 1540 07/11/23 0417 07/12/23 0357 07/13/23 0317 07/14/23 0409  WBC 16.1*  --  15.6* 14.5* 17.4* 17.6* 12.6*  LATICACIDVEN 2.2* 2.8* 1.1  --   --   --   --     Liver Function Tests: Recent Labs  Lab 07/10/23 0850 07/11/23 0417 07/12/23 0357 07/12/23 1535 07/13/23 0317  07/13/23 1546 07/14/23 0409  AST 32  --   --   --   --   --   --   ALT 20  --   --   --   --   --   --   ALKPHOS 46  --   --   --   --   --   --   BILITOT 3.6*  --   --   --   --   --   --   PROT 6.5  --   --   --   --   --   --   ALBUMIN 3.5   < > 2.8* 2.8* 2.5* 2.4* 2.3*   < > = values in this interval not displayed.   Recent Labs  Lab 07/10/23 1540  LIPASE 60*  AMYLASE 77   No results for input(s): "AMMONIA" in the last 168 hours.  ABG    Component Value Date/Time   PHART 7.6 (H) 07/12/2023 0813   PCO2ART <18 (LL) 07/12/2023 0813   PO2ART 156 (H) 07/12/2023 0813   HCO3 16.7 (L) 07/12/2023 0813   ACIDBASEDEF 1.8 07/12/2023 0813   O2SAT 99.8 07/12/2023 0813     Coagulation Profile: Recent Labs  Lab 07/10/23 0850 07/10/23 1540  INR 1.5* 1.7*    Cardiac Enzymes: No results for input(s): "CKTOTAL", "CKMB", "CKMBINDEX", "TROPONINI" in the last 168 hours.  HbA1C: Hgb A1c MFr Bld  Date/Time Value Ref Range Status  10/09/2022 11:27 AM 5.8 (H) 4.8 - 5.6 % Final    Comment:             Prediabetes: 5.7 - 6.4          Diabetes: >6.4          Glycemic control for adults with diabetes: <7.0   03/31/2014 08:30 AM 5.5 <5.7 % Final    Comment:    (  NOTE)                                                                       According to the ADA Clinical Practice Recommendations for 2011, when HbA1c is used as a screening test:  >=6.5%   Diagnostic of Diabetes Mellitus           (if abnormal result is confirmed) 5.7-6.4%   Increased risk of developing Diabetes Mellitus References:Diagnosis and Classification of Diabetes Mellitus,Diabetes Care,2011,34(Suppl 1):S62-S69 and Standards of Medical Care in         Diabetes - 2011,Diabetes Care,2011,34 (Suppl 1):S11-S61.     CBG: Recent Labs  Lab 07/10/23 1921 07/11/23 2239 07/12/23 2007 07/13/23 0000 07/13/23 0741  GLUCAP 125* 145* 129* 136* 111*    Review of Systems:   Unable to assess due to AMS   Past  Medical History:  He,  has a past medical history of Actinic keratosis, AICD (automatic cardioverter/defibrillator) present (03/31/2014), Arthritis, Asthma, Basal cell carcinoma (08/29/2008), Basal cell carcinoma (01/15/2018), Basal cell carcinoma (01/15/2018), Cardiac arrest [facility]), Coronary artery disease, non-occlusive, Dementia (HCC), Dysplastic nevus (01/31/2020), HLD (hyperlipidemia), HTN (hypertension), Kidney stones, Melanoma (HCC), NICM (nonischemic cardiomyopathy) (HCC), Paroxysmal atrial fibrillation (HCC) (03/28/2015), and Retinal artery occlusion.   Surgical History:   Past Surgical History:  Procedure Laterality Date   adenomatous polyps     CARDIAC CATHETERIZATION  03/30/2014   CARDIOVERSION  03/30/2014   COLONOSCOPY WITH PROPOFOL  N/A 03/24/2017   Procedure: COLONOSCOPY WITH PROPOFOL ;  Surgeon: Deveron Fly, MD;  Location: [facility] ENDOSCOPY;  Service: Endoscopy;  Laterality: N/A;   COLONOSCOPY WITH PROPOFOL  N/A 01/07/2019   Procedure: COLONOSCOPY WITH PROPOFOL ;  Surgeon: Toledo, Alphonsus Jeans, MD;  Location: [facility] ENDOSCOPY;  Service: Gastroenterology;  Laterality: N/A;   CYSTOSCOPY W/ LITHOLAPAXY / EHL     IMPLANTABLE CARDIOVERTER DEFIBRILLATOR IMPLANT N/A 03/31/2014   Procedure: IMPLANTABLE CARDIOVERTER DEFIBRILLATOR IMPLANT;  Surgeon: Tammie Fall, MD;  Location: [facility] CATH LAB;  Service: Cardiovascular;  Laterality: N/A;   JOINT REPLACEMENT     TONSILLECTOMY     "as a kid"   TOTAL KNEE ARTHROPLASTY Left ~ 2010   TOTAL KNEE ARTHROPLASTY WITH REVISION COMPONENTS Left ~ 2011   VARICOSE VEIN SURGERY Left ~ 2014     Social History:   reports that he has never smoked. He has never used smokeless tobacco. He reports that he does not drink alcohol and does not use drugs.   Family History:  His family history includes Colon polyps in his brother; Esophageal cancer in his mother; Heart attack in his father; Hypertension in his brother.   Allergies Allergies  Allergen Reactions    Entresto  [Sacubitril-Valsartan]     hallucinations     Home Medications  Prior to Admission medications   Medication Sig Start Date End Date Taking? Authorizing Provider  acetaminophen  (TYLENOL ) 650 MG CR tablet Take 1,300 mg by mouth every 8 (eight) hours as needed for pain.   Yes [provider]  atorvastatin  (LIPITOR) 10 MG tablet Take 1 tablet (10 mg total) by mouth daily. 10/02/22  Yes Tejan-Sie, S Ahmed, MD  Azelastine  HCl 137 MCG/SPRAY SOLN SPARY 1 SPRAY PER NARE DAILY 09/06/22  Yes Tejan-Sie, S Ahmed, MD  cetirizine  (ZYRTEC ) 10  MG tablet Take 1 tablet (10 mg total) by mouth daily. Take 1 tablet (10 mg) by mouth once daily 05/14/23 08/12/23 Yes Tejan-Sie, S Ahmed, MD  fluticasone -salmeterol (WIXELA INHUB ) 250-50 MCG/ACT AEPB TAKE 1 PUFF BY MOUTH TWICE A DAY 01/15/23  Yes Tejan-Sie, John Muzzy, MD  furosemide  (LASIX ) 40 MG tablet TAKE 1 TABLET BY MOUTH TWICE A DAY 2 DAYS A WEEK AND 1 TABLET ONCE A DAY ALL OTHER DAYS 06/16/23  Yes Verona Goodwill, MD  levothyroxine  (SYNTHROID ) 75 MCG tablet TAKE 1 TABLET BY MOUTH EVERY DAY BEFORE BREAKFAST 07/07/23  Yes Shari Daughters, MD  losartan  (COZAAR ) 25 MG tablet Take 1 tablet (25 mg total) by mouth daily. 05/14/23 08/12/23 Yes Tejan-Sie, S Ahmed, MD  Multiple Vitamin (MULTIVITAMIN WITH MINERALS) TABS tablet Take 1 tablet by mouth daily.   Yes [provider]  sotalol  (BETAPACE ) 80 MG tablet Take 1 tablet (80 mg total) by mouth 2 (two) times daily. 06/26/23  Yes Verona Goodwill, MD  spironolactone  (ALDACTONE ) 25 MG tablet Take 0.5 tablets (12.5 mg total) by mouth daily. 06/26/23  Yes Verona Goodwill, MD  triamcinolone  (NASACORT ) 55 MCG/ACT AERO nasal inhaler Place 1 spray into the nose 2 (two) times daily. Patient taking differently: Place 1 spray into the nose daily. 04/23/23 07/22/23 Yes Shari Daughters, MD  apixaban  (ELIQUIS ) 5 MG TABS tablet Take 1 tablet (5 mg total) by mouth 2 (two) times daily. 06/13/23   Verona Goodwill, MD  donepezil   (ARICEPT ) 5 MG tablet TAKE 1 TABLET BY MOUTH EVERYDAY AT BEDTIME 07/01/23   Shari Daughters, MD  ketoconazole (NIZORAL) 2 % cream Apply 1 Application topically 2 (two) times daily. Patient not taking: Reported on 07/10/2023 07/02/23   [provider]     Critical care time: 40 minutes     Cherylann Corpus, AGACNP-BC [facility] Prefer epic messenger for cross cover needs If after hours, please call E-link

## 2023-07-14 NOTE — Progress Notes (Signed)
 PT Cancellation Note  Patient Details Name: Ethan Vega. MRN: [id number] DOB: [birth year]   Cancelled Treatment:    Reason Eval/Treat Not Completed: Other (comment). Consult received. Pt currently on CRRT and not appropriate for PT eval. Will re-attempt when medically stable.   Ciel Yanes 07/14/2023, 2:54 PM Amparo Balk, PT, DPT, GCS [phone]

## 2023-07-14 NOTE — Consult Note (Signed)
 Pharmacy Antibiotic Note  Chappell Sartini. is a 78 y.o. male w/ PMH of HTN, nonischemic cardiomyopathy, atrial fibrillation, ventricular tachycardia, HLD, dementia admitted on 07/10/2023 with sepsis and cellulitis w/ AKI. Pharmacy has been consulted for vancomycin  dosing.  Assessment: Current regimen: vancomycin  2000 mg IV Q24H  Vanc rm 4/27 @ 1345: 15 ug/mL PK Calculations Cmin: 11.7 T 1/2: 13.5hr Vanc adjusted level for time is at goal of 10 - 15 mcg/mL   Plan:  CRRT continued reduced vancomycin  dose to 1500 mg IV every 24 hours  Plan to order a vancomycin  level prior to 3rd dose if continues Goal vancomycin  level 10 - 15 mcg/mL  Height: 5\' 3"  (160 cm) Weight: 85.1 kg (187 lb 9.8 oz) IBW/kg (Calculated) : 56.9  Temp (24hrs), Avg:98.7 F (37.1 C), Min:97.9 F (36.6 C), Max:99 F (37.2 C)  Recent Labs  Lab 07/10/23 0850 07/10/23 1039 07/10/23 1540 07/11/23 0016 07/11/23 0417 07/11/23 1535 07/12/23 0357 07/12/23 1535 07/13/23 0317 07/13/23 1345 07/13/23 1546 07/14/23 0409  WBC 16.1*  --  15.6*  --  14.5*  --  17.4*  --  17.6*  --   --  12.6*  CREATININE 5.58*  --  4.19*   < > 1.85*   < > 1.18 1.02 0.99  --  1.06 1.07  LATICACIDVEN 2.2* 2.8* 1.1  --   --   --   --   --   --   --   --   --   VANCORANDOM  --   --   --   --   --   --   --   --   --  15  --   --    < > = values in this interval not displayed.    Estimated Creatinine Clearance: 53.1 mL/min (by C-G formula based on SCr of 1.07 mg/dL).    Allergies  Allergen Reactions   Entresto  [Sacubitril-Valsartan]     hallucinations    Antimicrobials this admission: vancomycin  4/24 >>  ceftriaxone  4/24 >>  metronidazole  4/24 >>  Microbiology results: 4/24 BCx: NGTD 4/24 MRSA PCR: negative  Thank you for allowing pharmacy to be a part of this patient's care.  Adalberto Acton, PharmD 07/14/2023 10:34 AM

## 2023-07-14 NOTE — Consult Note (Signed)
 PHARMACY CONSULT NOTE - ELECTROLYTES  Pharmacy Consult for Electrolyte Monitoring and Replacement   Recent Labs: Height: 5\' 3"  (160 cm) Weight: 85.1 kg (187 lb 9.8 oz) IBW/kg (Calculated) : 56.9 Estimated Creatinine Clearance: 49.4 mL/min (by C-G formula based on SCr of 1.15 mg/dL). Potassium (mmol/L)  Date Value  07/14/2023 3.6  07/06/2014 4.2   Magnesium (mg/dL)  Date Value  16/12/9602 2.5 (H)   Calcium  (mg/dL)  Date Value  54/11/8117 8.1 (L)   Calcium , Total (mg/dL)  Date Value  14/78/2956 9.1   Albumin (g/dL)  Date Value  21/30/8657 2.3 (L)  01/15/2023 4.3  03/30/2014 3.5   Phosphorus (mg/dL)  Date Value  84/69/6295 3.0   Sodium (mmol/L)  Date Value  07/14/2023 136  06/26/2023 140  07/06/2014 141   Assessment  Ethan Vega. is a 78 y.o. male presenting with septic shock. PMH significant for HTN, cardiomyopathy, Afib, and HLD. Pharmacy has been consulted to monitor and replace electrolytes.  Goal of Therapy: Electrolytes WNL  Plan:  --Give KCl 10 mEq IV x 4 to aim for K>4 given history of Afib  --Check renal function panel BID while on CRRT   Thank you for allowing pharmacy to be a part of this patient's care.  Alice Innocent, PharmD Clinical Pharmacist  07/14/2023 4:55 PM

## 2023-07-14 NOTE — Progress Notes (Signed)
 PHARMACY - ANTICOAGULATION CONSULT NOTE  Pharmacy Consult for heparin  infusion Indication: atrial fibrillation  Allergies  Allergen Reactions   Entresto  [Sacubitril-Valsartan]     hallucinations    Patient Measurements: Height: 5\' 3"  (160 cm) Weight: 85.1 kg (187 lb 9.8 oz) IBW/kg (Calculated) : 56.9 HEPARIN  DW (KG): 77  Vital Signs: Temp: 98.7 F (37.1 C) (04/28 0400) Temp Source: Axillary (04/28 0400) BP: 88/56 (04/28 0430) Pulse Rate: 56 (04/28 0445)  Labs: Recent Labs    07/12/23 0357 07/12/23 0733 07/12/23 1535 07/13/23 0005 07/13/23 0317 07/13/23 1546 07/13/23 2204 07/14/23 0409  HGB 14.0  --   --   --  13.9  --   --  12.3*  HCT 40.0  --   --   --  39.4  --   --  35.2*  PLT 226  --   --   --  208  --   --  188  APTT  --  58* 80* 75*  --   --   --   --   HEPARINUNFRC  --  0.66  --  0.57  --   --  0.33 0.29*  CREATININE 1.18  --  1.02  --  0.99 1.06  --  1.07    Estimated Creatinine Clearance: 53.1 mL/min (by C-G formula based on SCr of 1.07 mg/dL).   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 03/2014 s/p SJM 2411-36C dual chamber AICD (serial Number [id number])- followed by Dr. Rodolfo Clan   Arthritis    a. L knee.   Asthma    Basal cell carcinoma 08/29/2008   Vertex scalp. Keratotic pattern, ulcerated.   Basal cell carcinoma 01/15/2018   Left distal medial thigh near knee. Fibroepithelioma of pinkus type   Basal cell carcinoma 01/15/2018   Right distal lat. nose ant. inferior edge. Nodular pattern.   Cardiac arrest (HCC)    a. 03/30/2014 VF arrest in setting of NICM >> CPR/defib in community >> s/p dual chamber AICD   Coronary artery disease, non-occlusive    a. cath 03/2014 at [facility]: no sig CAD (OM1 30%, CFX 30%), EF 35%   Dementia (HCC)    Dysplastic nevus 01/31/2020   L mid back paraspinal - severe, excision 03/21/2020   HLD (hyperlipidemia)    HTN  (hypertension)    Kidney stones    Melanoma (HCC)    Melanoma resected from scalp   NICM (nonischemic cardiomyopathy) (HCC)    a. 03/2014 Echo:  Inferolateral and lateral HK, EF 50-55%, grade 1 diastolic dysfunction, mild MR, mild LAE, mild RAE, trivial TR, no effusion; 09/2018 Echo: EF 35-40%, impaired relaxation, glob HK, sev apical HK. Nl RV fxn. RVSP . Mildly dil LA.   Paroxysmal atrial fibrillation (HCC) 03/28/2015   Retinal artery occlusion     Medications:  Scheduled:   vitamin C  500 mg Oral BID   Chlorhexidine  Gluconate Cloth  6 each Topical Daily   feeding supplement  237 mL Oral TID BM   heparin   1,150 Units Intravenous Once   hydrocortisone  sod succinate (SOLU-CORTEF ) inj  100 mg Intravenous Q8H   leptospermum manuka honey  1 Application Topical Daily   lidocaine   1 patch Transdermal Q24H   liver oil-zinc  oxide   Topical BID   multivitamin  1 tablet Oral QHS   mouth rinse  15 mL Mouth Rinse 4 times per day   pneumococcal 20-valent conjugate vaccine  0.5 mL Intramuscular Tomorrow-1000   QUEtiapine   25 mg Oral QHS   sodium chloride  flush  10-40 mL Intracatheter Q12H   thiamine  (VITAMIN B1) injection  100 mg Intravenous Daily    Assessment: 78 year old male w/ PMH of HTN, nonischemic cardiomyopathy, atrial fibrillation,  ventricular tachycardia, HLD, dementia admitted with circulatory shock with  AKI. Noted to be on apixaban  PTA with last dose 07/08/23  4/26 0733 aPTT 58 heparin  level 0.66 4/26 1535 aPTT 80 4/27 0005 aPTT 75 HL 0.57 4/27 2204 HL 0.33 4/28 0409 HL 0.29, SUBtherapeutic   Goal of Therapy:  Anti-Xa level 0.3-0.7 units/ml once heparin  level and aPTT correlate.  aPTT 66 - 102 seconds Monitor platelets by anticoagulation protocol: Yes   Plan:  4/28:  HL @ 0409 = 0.29, SUBtherapeutic - Will order heparin  1150 units IV X 1 bolus and increase drip rate to 900 units/hr - Will recheck HL 8 hrs after rate change - CBC daily  Ethan Vega D,  PharmD Clinical Pharmacist  07/14/2023,4:55 AM

## 2023-07-14 NOTE — Consult Note (Signed)
 PHARMACY CONSULT NOTE - ELECTROLYTES  Pharmacy Consult for Electrolyte Monitoring and Replacement   Recent Labs: Height: 5\' 3"  (160 cm) Weight: 85.1 kg (187 lb 9.8 oz) IBW/kg (Calculated) : 56.9 Estimated Creatinine Clearance: 53.1 mL/min (by C-G formula based on SCr of 1.07 mg/dL). Potassium (mmol/L)  Date Value  07/14/2023 4.1  07/06/2014 4.2   Magnesium (mg/dL)  Date Value  16/12/9602 2.5 (H)   Calcium  (mg/dL)  Date Value  54/11/8117 8.0 (L)   Calcium , Total (mg/dL)  Date Value  14/78/2956 9.1   Albumin (g/dL)  Date Value  21/30/8657 2.3 (L)  01/15/2023 4.3  03/30/2014 3.5   Phosphorus (mg/dL)  Date Value  84/69/6295 1.9 (L)   Sodium (mmol/L)  Date Value  07/14/2023 137  06/26/2023 140  07/06/2014 141   Assessment  Ethan Vega. is a 78 y.o. male presenting with septic shock. PMH significant for HTN, cardiomyopathy, Afib, and HLD. Pharmacy has been consulted to monitor and replace electrolytes.  Goal of Therapy: Electrolytes WNL  Plan:  --30 mmol IV sodium phosphate x 1 per NP --Check renal function panel BID while on CRRT   Thank you for allowing pharmacy to be a part of this patient's care.  Adalberto Acton, PharmD Clinical Pharmacist  07/14/2023 7:09 AM

## 2023-07-14 NOTE — Plan of Care (Signed)
  Problem: Education: Goal: Knowledge of General Education information will improve Description: Including pain rating scale, medication(s)/side effects and non-pharmacologic comfort measures Outcome: Progressing   Problem: Health Behavior/Discharge Planning: Goal: Ability to manage health-related needs will improve Outcome: Progressing   Problem: Clinical Measurements: Goal: Ability to maintain clinical measurements within normal limits will improve Outcome: Progressing Goal: Will remain free from infection Outcome: Progressing Goal: Diagnostic test results will improve Outcome: Progressing Goal: Respiratory complications will improve Outcome: Progressing Goal: Cardiovascular complication will be avoided Outcome: Progressing   Problem: Nutrition: Goal: Adequate nutrition will be maintained Outcome: Progressing   Problem: Activity: Goal: Risk for activity intolerance will decrease Outcome: Progressing   Problem: Elimination: Goal: Will not experience complications related to bowel motility Outcome: Progressing Goal: Will not experience complications related to urinary retention Outcome: Progressing   Problem: Safety: Goal: Ability to remain free from injury will improve Outcome: Progressing   Problem: Skin Integrity: Goal: Risk for impaired skin integrity will decrease Outcome: Not Progressing

## 2023-07-14 NOTE — Progress Notes (Signed)
 Palliative Care Progress Note, Assessment & Plan   Patient Name: Ethan Vega.       Date: 07/14/2023 DOB: 02/23/1946  Age: 78 y.o. MRN#: [id number] Attending Physician: Vergia Glasgow, MD Primary Care Physician: Shari Daughters, MD Admit Date: 07/10/2023  Subjective: Patient resting in bed with daughters at bedside.  Slept well last night.  Reports eating small amount of breakfast.  He denies pain.  HPI: 78 y.o. male  with past medical history of  essential hypertension, nonischemic cardiomyopathy history of paroxysmal A-fib and ventricular tachycardia, dyslipidemia. He presented to hospital 07/10/23 to have ICD generator changed. Staff found him to have open wounds to LLE, confusion and hypotension. He was subsequently sent to ED for evaluation. ED workup found patient to have AKI, leukocytosis and hypotension with creatinine 5.5 (baseline 1.7), Na+ 134, K+ 5.4, BUN 151, BNP 672.1, Trop 164, Lactic 2.2 and WBC 16.1.   He received IV fluid resuscitation but required pressor support. Daughter at bedside reported that patient lives alone, performing his ADLs independently. She adds that he complained of diarrhea and poor PO intake 2 weeks prior. Pt was unaware of the open sore to his LLE but did confirm chronic lower extremity pitting edema. He was admitted to ICU for circulatory shock with AKI.    Palliative care was consulted to discuss goals of care.     Summary of counseling/coordination of care: Extensive chart review completed prior to meeting patient including labs, vital signs, imaging, progress notes, orders, and available advanced directive documents from current and previous encounters.   After reviewing the patient's chart and assessing the patient at bedside, I spoke with the patient and  his 2 daughters Point and Myrla Asp, in regards to symptom management and goals of care.   Ill-appearing, elderly male resting in bed.  He is alert to self.  Patient is more sleepy and not as talkative as yesterday.  Noted to fall asleep during conversation, sleeping most of the visit.  He has even, unlabored respirations.  He is in no distress.  Discussed with daughters that patient was able to be weaned off 1 pressor medication.  He remains on CRRT but is oliguric with minimal urine output over the past 24 hours (90 cc).  Both daughters inquire where patient will go from here. Advised that if their father is able to recover and become stable enough to transfer to medical bed, at that time a decision would be made about placement such as facility.  They both understand that their father may never go back home and live independently.  Much discussion was had in regard to critical illness, multiple comorbidities and advanced age contributing to poor prognosis.  They inquire if kidneys do not return to normal function what is the option.  Advised if patient remains in renal failure, he is not a candidate for dialysis due to dementia and advanced age.  At that time, they could consider comfort care/hospice path.  Although daughters were tearful during this conversation, they were receptive and verbalized understanding of poor prognosis.  At this time Myrla Asp and Urwin would like a few more days for outcomes.  Both daughters expressed appreciation of information and visit.  Therapeutic silence and active listening provided for Buxbaum and Candy to share their thoughts and emotions regarding current medical situation.  Emotional support provided.  Physical Exam Vitals reviewed.  Constitutional:      General: He is not in acute distress.    Appearance: He is ill-appearing.  HENT:     Head: Normocephalic and atraumatic.     Mouth/Throat:     Mouth: Mucous membranes are dry.  Pulmonary:     Effort: Pulmonary  effort is normal. No respiratory distress.  Skin:    General: Skin is warm and dry.  Neurological:     Mental Status: He is alert. He is disoriented.    Recommendations/Plan: Continue DNR/DNI status as previously documented    Continue current supportive interventions Allow daughter to bring patient's pet dog for visit  PMT to continue to follow for needs and high risk for decline   Total Time 50 minutes     Time spent includes: Detailed review of medical records (labs, imaging, vital signs), medically appropriate exam (mental status, respiratory, cardiac, skin), discussed with treatment team, counseling and educating patient, family and staff, documenting clinical information, medication management and coordination of care.     Ina Manas, Joyice Nodal [facility] Palliative Medicine Team  07/14/2023 8:31 AM  Office [phone]  Pager [phone]

## 2023-07-14 NOTE — Progress Notes (Signed)
 Patient alert to self, following commands, on room air, with stable pressures on pressor support. Patient on CRRT, tolerating well.

## 2023-07-14 NOTE — Progress Notes (Signed)
 PHARMACY - ANTICOAGULATION CONSULT NOTE  Pharmacy Consult for heparin  infusion Indication: atrial fibrillation  Allergies  Allergen Reactions   Entresto  [Sacubitril-Valsartan]     hallucinations    Patient Measurements: Height: 5\' 3"  (160 cm) Weight: 85.1 kg (187 lb 9.8 oz) IBW/kg (Calculated) : 56.9 HEPARIN  DW (KG): 77  Vital Signs: Temp: 97.7 F (36.5 C) (04/28 1200) Temp Source: Axillary (04/28 1200) BP: 123/54 (04/28 1315) Pulse Rate: 89 (04/28 1315)  Labs: Recent Labs    07/12/23 0006 07/12/23 0357 07/12/23 0733 07/12/23 1535 07/13/23 0005 07/13/23 0317 07/13/23 1546 07/13/23 2204 07/14/23 0409  HGB  --  14.0  --   --   --  13.9  --   --  12.3*  HCT  --  40.0  --   --   --  39.4  --   --  35.2*  PLT  --  226  --   --   --  208  --   --  188  APTT  --   --  58* 80* 75*  --   --   --   --   HEPARINUNFRC   < >  --  0.66  --  0.57  --   --  0.33 0.29*  CREATININE  --  1.18  --  1.02  --  0.99 1.06  --  1.07   < > = values in this interval not displayed.    Estimated Creatinine Clearance: 53.1 mL/min (by C-G formula based on SCr of 1.07 mg/dL).   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 03/2014 s/p SJM 2411-36C dual chamber AICD (serial Number [id number])- followed by Dr. Rodolfo Clan   Arthritis    a. L knee.   Asthma    Basal cell carcinoma 08/29/2008   Vertex scalp. Keratotic pattern, ulcerated.   Basal cell carcinoma 01/15/2018   Left distal medial thigh near knee. Fibroepithelioma of pinkus type   Basal cell carcinoma 01/15/2018   Right distal lat. nose ant. inferior edge. Nodular pattern.   Cardiac arrest (HCC)    a. 03/30/2014 VF arrest in setting of NICM >> CPR/defib in community >> s/p dual chamber AICD   Coronary artery disease, non-occlusive    a. cath 03/2014 at [facility]: no sig CAD (OM1 30%, CFX 30%), EF 35%   Dementia (HCC)    Dysplastic nevus  01/31/2020   L mid back paraspinal - severe, excision 03/21/2020   HLD (hyperlipidemia)    HTN (hypertension)    Kidney stones    Melanoma (HCC)    Melanoma resected from scalp   NICM (nonischemic cardiomyopathy) (HCC)    a. 03/2014 Echo:  Inferolateral and lateral HK, EF 50-55%, grade 1 diastolic dysfunction, mild MR, mild LAE, mild RAE, trivial TR, no effusion; 09/2018 Echo: EF 35-40%, impaired relaxation, glob HK, sev apical HK. Nl RV fxn. RVSP . Mildly dil LA.   Paroxysmal atrial fibrillation (HCC) 03/28/2015   Retinal artery occlusion     Medications:  Scheduled:   vitamin C  500 mg Oral BID   Chlorhexidine  Gluconate Cloth  6 each Topical Daily   feeding supplement  237 mL Oral TID BM   [START ON 07/15/2023] hydrocortisone  sod succinate (SOLU-CORTEF ) inj  100 mg Intravenous Q12H   leptospermum manuka honey  1 Application Topical Daily   lidocaine   1 patch Transdermal Q24H   liver oil-zinc  oxide   Topical BID  multivitamin  1 tablet Oral QHS   mouth rinse  15 mL Mouth Rinse 4 times per day   pneumococcal 20-valent conjugate vaccine  0.5 mL Intramuscular Tomorrow-1000   sodium chloride  flush  10-40 mL Intracatheter Q12H   thiamine  (VITAMIN B1) injection  100 mg Intravenous Daily    Assessment: 78 year old male w/ PMH of HTN, nonischemic cardiomyopathy, atrial fibrillation,  ventricular tachycardia, HLD, dementia admitted with circulatory shock with  AKI. Noted to be on apixaban  PTA with last dose 07/08/23  Goal of Therapy:  Anti-Xa level 0.3-0.7 units/ml   Monitor platelets by anticoagulation protocol: Yes   Plan: Anti-Xa level therapeutic x 1 ---continue heparin  infusion at 900 units/hr - Will recheck Anti-Xa level 8 hrs to confirm - CBC daily  Adalberto Acton, PharmD Clinical Pharmacist  07/14/2023,1:51 PM

## 2023-07-15 ENCOUNTER — Encounter: Payer: Self-pay | Admitting: Internal Medicine

## 2023-07-15 DIAGNOSIS — R6521 Severe sepsis with septic shock: Secondary | ICD-10-CM | POA: Diagnosis not present

## 2023-07-15 DIAGNOSIS — N179 Acute kidney failure, unspecified: Secondary | ICD-10-CM | POA: Diagnosis not present

## 2023-07-15 DIAGNOSIS — R7989 Other specified abnormal findings of blood chemistry: Secondary | ICD-10-CM | POA: Diagnosis not present

## 2023-07-15 DIAGNOSIS — A419 Sepsis, unspecified organism: Secondary | ICD-10-CM | POA: Diagnosis not present

## 2023-07-15 LAB — RENAL FUNCTION PANEL
Albumin: 2.5 g/dL — ABNORMAL LOW (ref 3.5–5.0)
Albumin: 2.4 g/dL — ABNORMAL LOW (ref 3.5–5.0)
Anion gap: 8 (ref 5–15)
Anion gap: 12 (ref 5–15)
BUN: 11 mg/dL (ref 8–23)
BUN: 13 mg/dL (ref 8–23)
CO2: 23 mmol/L (ref 22–32)
CO2: 24 mmol/L (ref 22–32)
Calcium: 7.3 mg/dL — ABNORMAL LOW (ref 8.9–10.3)
Calcium: 7.9 mg/dL — ABNORMAL LOW (ref 8.9–10.3)
Chloride: 102 mmol/L (ref 98–111)
Chloride: 102 mmol/L (ref 98–111)
Creatinine, Ser: 1.1 mg/dL (ref 0.61–1.24)
Creatinine, Ser: 1.02 mg/dL (ref 0.61–1.24)
GFR, Estimated: >60 mL/min (ref >60)
GFR, Estimated: >60 mL/min (ref >60)
Glucose, Bld: 92 mg/dL (ref 70–99)
Glucose, Bld: 96 mg/dL (ref 70–99)
Phosphorus: 1.6 mg/dL — ABNORMAL LOW (ref 2.5–4.6)
Phosphorus: 3.9 mg/dL (ref 2.5–4.6)
Potassium: 4.1 mmol/L (ref 3.5–5.1)
Potassium: 4.2 mmol/L (ref 3.5–5.1)
Sodium: 133 mmol/L — ABNORMAL LOW (ref 135–145)
Sodium: 138 mmol/L (ref 135–145)

## 2023-07-15 LAB — CULTURE, BLOOD (ROUTINE X 2)
Culture: NO GROWTH
Culture: NO GROWTH

## 2023-07-15 LAB — CBC
HCT: 37 % — ABNORMAL LOW (ref 39.0–52.0)
Hemoglobin: 12.6 g/dL — ABNORMAL LOW (ref 13.0–17.0)
MCH: 32.1 pg (ref 26.0–34.0)
MCHC: 34.1 g/dL (ref 30.0–36.0)
MCV: 94.4 fL (ref 80.0–100.0)
Platelets: 175 10*3/uL (ref 150–400)
RBC: 3.92 MIL/uL — ABNORMAL LOW (ref 4.22–5.81)
RDW: 15.4 % (ref 11.5–15.5)
WBC: 13.6 10*3/uL — ABNORMAL HIGH (ref 4.0–10.5)
nRBC: 0.4 % — ABNORMAL HIGH (ref 0.0–0.2)

## 2023-07-15 LAB — MAGNESIUM: Magnesium: 2.6 mg/dL — ABNORMAL HIGH (ref 1.7–2.4)

## 2023-07-15 LAB — HEPARIN LEVEL (UNFRACTIONATED): Heparin Unfractionated: 0.44 [IU]/mL (ref 0.30–0.70)

## 2023-07-15 LAB — GLUCOSE, CAPILLARY: Glucose-Capillary: 98 mg/dL (ref 70–99)

## 2023-07-15 MED ORDER — PIPERACILLIN-TAZOBACTAM 3.375 G IVPB
3.3750 g | Freq: Two times a day (BID) | INTRAVENOUS | Status: AC
  Administered 2023-07-16 (×2): 3.375 g via INTRAVENOUS
  Filled 2023-07-15 (×2): qty 50

## 2023-07-15 MED ORDER — SODIUM PHOSPHATES 45 MMOLE/15ML IV SOLN
30.0000 mmol | Freq: Once | INTRAVENOUS | Status: AC
  Administered 2023-07-15: 30 mmol via INTRAVENOUS
  Filled 2023-07-15: qty 10

## 2023-07-15 MED ORDER — QUETIAPINE FUMARATE 25 MG PO TABS
25.0000 mg | ORAL_TABLET | Freq: Every day | ORAL | Status: DC
  Administered 2023-07-15 – 2023-07-31 (×17): 25 mg via ORAL
  Filled 2023-07-15 (×17): qty 1

## 2023-07-15 MED ORDER — CHLORHEXIDINE GLUCONATE CLOTH 2 % EX PADS
6.0000 | MEDICATED_PAD | Freq: Every day | CUTANEOUS | Status: DC
  Administered 2023-07-15 – 2023-07-22 (×7): 6 via TOPICAL

## 2023-07-15 NOTE — Plan of Care (Signed)
  Problem: Education: Goal: Knowledge of General Education information will improve Description: Including pain rating scale, medication(s)/side effects and non-pharmacologic comfort measures Outcome: Not Progressing   Problem: Health Behavior/Discharge Planning: Goal: Ability to manage health-related needs will improve Outcome: Not Progressing   Problem: Clinical Measurements: Goal: Ability to maintain clinical measurements within normal limits will improve Outcome: Progressing Goal: Diagnostic test results will improve Outcome: Progressing Goal: Respiratory complications will improve Outcome: Progressing Goal: Cardiovascular complication will be avoided Outcome: Progressing   Problem: Activity: Goal: Risk for activity intolerance will decrease Outcome: Progressing   Problem: Nutrition: Goal: Adequate nutrition will be maintained Outcome: Not Progressing   Problem: Elimination: Goal: Will not experience complications related to bowel motility Outcome: Progressing   Problem: Pain Managment: Goal: General experience of comfort will improve and/or be controlled Outcome: Progressing

## 2023-07-15 NOTE — Progress Notes (Signed)
 PHARMACY - ANTICOAGULATION CONSULT NOTE  Pharmacy Consult for heparin  infusion Indication: atrial fibrillation  Allergies  Allergen Reactions   Entresto  [Sacubitril-Valsartan]     hallucinations    Patient Measurements: Height: 5\' 3"  (160 cm) Weight: 82 kg (180 lb 12.4 oz) IBW/kg (Calculated) : 56.9 HEPARIN  DW (KG): 77  Vital Signs: Temp: 98.4 F (36.9 C) (04/29 0400) Temp Source: Axillary (04/29 0400) BP: 96/63 (04/29 0600) Pulse Rate: 91 (04/29 0600)  Labs: Recent Labs    07/12/23 0733 07/12/23 0733 07/12/23 1535 07/12/23 1535 07/13/23 0005 07/13/23 0317 07/13/23 1546 07/13/23 2204 07/14/23 0409 07/14/23 1334 07/14/23 1527 07/14/23 2221 07/15/23 0509  HGB  --   --   --    < >  --  13.9  --   --  12.3*  --   --   --  12.6*  HCT  --   --   --   --   --  39.4  --   --  35.2*  --   --   --  37.0*  PLT  --   --   --   --   --  208  --   --  188  --   --   --  175  APTT 58*  --  80*  --  75*  --   --   --   --   --   --   --   --   HEPARINUNFRC 0.66  --   --   --  0.57  --   --    < > 0.29* 0.41  --  0.49 0.44  CREATININE  --    < > 1.02  --   --  0.99 1.06  --  1.07  --  1.15  --   --    < > = values in this interval not displayed.    Estimated Creatinine Clearance: 48.5 mL/min (by C-G formula based on SCr of 1.15 mg/dL).   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 03/2014 s/p SJM 2411-36C dual chamber AICD (serial Number [id number])- followed by Dr. Rodolfo Clan   Arthritis    a. L knee.   Asthma    Basal cell carcinoma 08/29/2008   Vertex scalp. Keratotic pattern, ulcerated.   Basal cell carcinoma 01/15/2018   Left distal medial thigh near knee. Fibroepithelioma of pinkus type   Basal cell carcinoma 01/15/2018   Right distal lat. nose ant. inferior edge. Nodular pattern.   Cardiac arrest (HCC)    a. 03/30/2014 VF arrest in setting of NICM >> CPR/defib in  community >> s/p dual chamber AICD   Coronary artery disease, non-occlusive    a. cath 03/2014 at [facility]: no sig CAD (OM1 30%, CFX 30%), EF 35%   Dementia (HCC)    Dysplastic nevus 01/31/2020   L mid back paraspinal - severe, excision 03/21/2020   HLD (hyperlipidemia)    HTN (hypertension)    Kidney stones    Melanoma (HCC)    Melanoma resected from scalp   NICM (nonischemic cardiomyopathy) (HCC)    a. 03/2014 Echo:  Inferolateral and lateral HK, EF 50-55%, grade 1 diastolic dysfunction, mild MR, mild LAE, mild RAE, trivial TR, no effusion; 09/2018 Echo: EF 35-40%, impaired relaxation, glob HK, sev apical HK. Nl RV fxn. RVSP . Mildly dil LA.   Paroxysmal atrial fibrillation (HCC) 03/28/2015   Retinal artery occlusion  Medications:  Scheduled:   vitamin C  500 mg Oral BID   Chlorhexidine  Gluconate Cloth  6 each Topical Daily   feeding supplement  237 mL Oral TID BM   hydrocortisone  sod succinate (SOLU-CORTEF ) inj  100 mg Intravenous Q12H   leptospermum manuka honey  1 Application Topical Daily   levothyroxine   75 mcg Oral Q0600   lidocaine   1 patch Transdermal Q24H   liver oil-zinc  oxide   Topical BID   multivitamin  1 tablet Oral QHS   mouth rinse  15 mL Mouth Rinse 4 times per day   pneumococcal 20-valent conjugate vaccine  0.5 mL Intramuscular Tomorrow-1000   sodium chloride  flush  10-40 mL Intracatheter Q12H   thiamine  (VITAMIN B1) injection  100 mg Intravenous Daily    Assessment: 78 year old male w/ PMH of HTN, nonischemic cardiomyopathy, atrial fibrillation,  ventricular tachycardia, HLD, dementia admitted with circulatory shock with  AKI. Noted to be on apixaban  PTA with last dose 07/08/23  Goal of Therapy:  Anti-Xa level 0.3-0.7 units/ml   Monitor platelets by anticoagulation protocol: Yes   Plan: Anti-Xa level therapeutic x 3 - Continue heparin  infusion at 900 units/hr - Recheck HL daily w/ AM labs while therapeutic - CBC daily  Coretta Dexter, PharmD,  [facility] 07/15/2023 6:13 AM

## 2023-07-15 NOTE — Consult Note (Signed)
 PHARMACY CONSULT NOTE - ELECTROLYTES  Pharmacy Consult for Electrolyte Monitoring and Replacement   Recent Labs: Height: 5\' 3"  (160 cm) Weight: 82 kg (180 lb 12.4 oz) IBW/kg (Calculated) : 56.9 Estimated Creatinine Clearance: 50.7 mL/min (by C-G formula based on SCr of 1.1 mg/dL). Potassium (mmol/L)  Date Value  07/15/2023 4.1  07/06/2014 4.2   Magnesium (mg/dL)  Date Value  16/12/9602 2.6 (H)   Calcium  (mg/dL)  Date Value  54/11/8117 7.3 (L)   Calcium , Total (mg/dL)  Date Value  14/78/2956 9.1   Albumin (g/dL)  Date Value  21/30/8657 2.5 (L)  01/15/2023 4.3  03/30/2014 3.5   Phosphorus (mg/dL)  Date Value  84/69/6295 1.6 (L)   Sodium (mmol/L)  Date Value  07/15/2023 133 (L)  06/26/2023 140  07/06/2014 141   Assessment  Ethan Vega. is a 78 y.o. male presenting with septic shock. PMH significant for HTN, cardiomyopathy, Afib, and HLD. Pharmacy has been consulted to monitor and replace electrolytes.  Goal of Therapy: Electrolytes WNL  Plan:  --Phos 1.6, repeat sodium phosphate 30 mmol IV x 1 dose today --Check renal function panel BID while on CRRT; nephrology planning to switch to iHD when off pressors  Thank you for allowing pharmacy to be a part of this patient's care.  Page Boast 07/15/2023 7:44 AM

## 2023-07-15 NOTE — Progress Notes (Addendum)
 NAME:  Ethan Vega., MRN:  [id number], DOB:  01-24-1946, LOS: 5 ADMISSION DATE:  07/10/2023, CONSULTATION DATE:  07/10/2023 REFERRING MD:  Dr. Alejo Amsler, CHIEF COMPLAINT:  Hypotension, AMS   Brief Pt Description / Synopsis:  78 y.o. male, DNR/DNI, admitted with Acute Metabolic Encephalopathy and Severe Sepsis with Septic Shock due to Left Lower Extremity Cellulitis, along with AKI requiring intiation of CRRT.  History of Present Illness:  Ethan Vega is a 78 year old male with a history of essential hypertension, nonischemic cardiomyopathy history of paroxysmal A-fib and ventricular tachycardia, dyslipidemia who was seen earlier today with electrophysiology service to have ICD generator change however he was noted to have open sores on his left lower extremity and was mentating with confusion and encephalopathy with decision to abort procedure and reschedule. He was subsequently sent to the ER. His workup revealed severe AKI and leukocytosis and vitals with shock physiology. Patient received IV fluid resuscitation but despite 3 L of intravenous fluids continues to require Levophed  support. He reports diarrhea over the past week. Patient currently lives alone in his home and was previously able to perform all activities of daily living. He is unaware of the open score and cellulitic lesion of his left foot but does admit to chronic lower extremity pitting edema. ICU admission requested for circulatory shock with severe AKI.   Please see "Significant Hospital Events" section below for full detailed hospital course.   Pertinent  Medical History   Past Medical History:  Diagnosis Date   Actinic keratosis    AICD (automatic cardioverter/defibrillator) present 03/31/2014   a. 03/2014 s/p SJM 2411-36C dual chamber AICD (serial Number [id number])- followed by Dr. Rodolfo Clan   Arthritis    a. L knee.   Asthma    Basal cell carcinoma 08/29/2008   Vertex scalp. Keratotic pattern, ulcerated.   Basal cell  carcinoma 01/15/2018   Left distal medial thigh near knee. Fibroepithelioma of pinkus type   Basal cell carcinoma 01/15/2018   Right distal lat. nose ant. inferior edge. Nodular pattern.   Cardiac arrest (HCC)    a. 03/30/2014 VF arrest in setting of NICM >> CPR/defib in community >> s/p dual chamber AICD   Coronary artery disease, non-occlusive    a. cath 03/2014 at [facility]: no sig CAD (OM1 30%, CFX 30%), EF 35%   Dementia (HCC)    Dysplastic nevus 01/31/2020   L mid back paraspinal - severe, excision 03/21/2020   HLD (hyperlipidemia)    HTN (hypertension)    Kidney stones    Melanoma (HCC)    Melanoma resected from scalp   NICM (nonischemic cardiomyopathy) (HCC)    a. 03/2014 Echo:  Inferolateral and lateral HK, EF 50-55%, grade 1 diastolic dysfunction, mild MR, mild LAE, mild RAE, trivial TR, no effusion; 09/2018 Echo: EF 35-40%, impaired relaxation, glob HK, sev apical HK. Nl RV fxn. RVSP . Mildly dil LA.   Paroxysmal atrial fibrillation (HCC) 03/28/2015   Retinal artery occlusion     Micro Data:  4/24: SARS-CoV-2/flu/RSV PCR>> negative 4/24: Blood cultures x 2>> no growth  4/24: MRSA PCR>> negative  Antimicrobials:   Anti-infectives (From admission, onward)    Start     Dose/Rate Route Frequency Ordered Stop   07/14/23 1600  vancomycin  (VANCOREADY) IVPB 1500 mg/300 mL        1,500 mg 150 mL/hr over 120 Minutes Intravenous Every 24 hours 07/14/23 1349 07/17/23 1559   07/14/23 1500  vancomycin  (VANCOREADY) IVPB 2000 mg/400 mL  Status:  Discontinued  2,000 mg 200 mL/hr over 120 Minutes Intravenous Every 24 hours 07/13/23 1500 07/13/23 1501   07/13/23 1600  vancomycin  (VANCOREADY) IVPB 2000 mg/400 mL  Status:  Discontinued        2,000 mg 200 mL/hr over 120 Minutes Intravenous Every 24 hours 07/13/23 1501 07/14/23 1349   07/12/23 1200  piperacillin-tazobactam (ZOSYN) IVPB 3.375 g        3.375 g 100 mL/hr over 30 Minutes Intravenous Every 6 hours 07/12/23 1026 07/16/23  2359   07/11/23 1500  vancomycin  (VANCOCIN ) IVPB 1000 mg/200 mL premix  Status:  Discontinued        1,000 mg 200 mL/hr over 60 Minutes Intravenous Every 24 hours 07/10/23 1626 07/11/23 1201   07/11/23 1500  vancomycin  (VANCOREADY) IVPB 1500 mg/300 mL  Status:  Discontinued        1,500 mg 150 mL/hr over 120 Minutes Intravenous Every 24 hours 07/11/23 1201 07/13/23 1500   07/11/23 1000  cefTRIAXone  (ROCEPHIN ) 2 g in sodium chloride  0.9 % 100 mL IVPB  Status:  Discontinued        2 g 200 mL/hr over 30 Minutes Intravenous Every 24 hours 07/10/23 1351 07/12/23 1025   07/11/23 0800  vancomycin  variable dose per unstable renal function (pharmacist dosing)  Status:  Discontinued         Does not apply See admin instructions 07/10/23 1359 07/11/23 1201   07/10/23 1400  metroNIDAZOLE  (FLAGYL ) IVPB 500 mg  Status:  Discontinued        500 mg 100 mL/hr over 60 Minutes Intravenous Every 12 hours 07/10/23 1351 07/12/23 1026   07/10/23 1400  vancomycin  (VANCOCIN ) IVPB 1000 mg/200 mL premix        1,000 mg 200 mL/hr over 60 Minutes Intravenous  Once 07/10/23 1359 07/10/23 1518   07/10/23 0830  cefTRIAXone  (ROCEPHIN ) 2 g in sodium chloride  0.9 % 100 mL IVPB        2 g 200 mL/hr over 30 Minutes Intravenous Once 07/10/23 0818 07/10/23 0946   07/10/23 0830  vancomycin  (VANCOCIN ) IVPB 1000 mg/200 mL premix        1,000 mg 200 mL/hr over 60 Minutes Intravenous  Once 07/10/23 0818 07/10/23 1026       Significant Hospital Events: Including procedures, antibiotic start and stop dates in addition to other pertinent events   07/10/23- Presents to ED, PCCM asked to admit.  Required placement of Trialysis catheter and initiation of CRRT. 07/11/23- patient s/p CRRT overnight, does have sacral decub stage 2 present on arrival. On Rovephin,flagyl , vancomycin .  LLL cellulitic lesion without exudate and no osteo per imaging.  Remains on levophed  and vasopressin .  Startign renal diet via OGT. 07/12/23- patient seen at  bedside, daughter is at bedside.  We discussed goals of care and ordered consultation with palliative care.  He is oliguric, remains on high dose 2 vasopressor req, prognosis is guarded and we are considering.  07/13/23- patient with mild improvement from 30 to 8 on levophed  dose, CRRT -50/h with olgiuric AKI, mentation improved, still overall poor prognosis long term and is working with Palliative care specialist. 07/14/23-no significant events overnight.  Remains on CRRT, oliguric with urine output of 90 cc over the past 24 hours (+285 cc).  Vasopressin  weaned off, on 3 mcg Levophed , will decrease solucortef to 100 mg BID.  Mental status slowly improving.  07/15/23- Overnight with some delirium, Seroquel  resumed.  Wean down on Levophed  as tolerated (currently 1 mcg).  Remains on CRRT and oliguric with  UOP 80 cc urine last 24 hrs (net - 825 cc).  Interim History / Subjective:  As outlined above under "Significant Hospital Events" section  Objective   Blood pressure (!) 97/57, pulse 88, temperature 98.4 F (36.9 C), temperature source Axillary, resp. rate (!) 21, height 5\' 3"  (1.6 m), weight 82 kg, SpO2 96%.        Intake/Output Summary (Last 24 hours) at 07/15/2023 0732 Last data filed at 07/15/2023 0700 Gross per 24 hour  Intake 1648.53 ml  Output 2822 ml  Net -1173.47 ml   Filed Weights   07/13/23 0530 07/14/23 0320 07/15/23 0320  Weight: 83.7 kg 85.1 kg 82 kg    Examination: General: Acute on Chronically ill-appearing male, sitting in bed, on room air, no acute distress HENT: Atraumatic, normocephalic, neck supple, no JVD Lungs: Clear breath sounds throughout, even, nonlabored, normal effort Cardiovascular: Paced rhythm, S1-S2, no murmurs, rubs, gallops Abdomen: Obese, soft, nontender, nondistended, no guarding or tenderness, sounds positive x 4 Extremities: Generalized weakness, normal bulk and tone, trace edema bilateral lower extremities, cellulitis and wound to left foot Neuro:  Awake and alert, oriented to person and place.  Moves all extremities to commands, no focal deficits, speech clear, pupils PERRLA GU: Foley catheter in place draining small amount of yellow urine  Resolved Hospital Problem list     Assessment & Plan:   #Septic Shock #Chronic Atrial Fibrillation #Mildly Elevated Troponin in setting of demand ischemia PMHx: HTN, nonischemic cardiomyopathy, Paroxsymal atrial fibrillation, V. Tach, HLD Echocardiogram 07/08/23: LVEF 30 to 35%, indeterminate diastolic parameters, RV systolic function normal, RV size is normal -Continuous cardiac monitoring -Maintain MAP >65 -Cautious IV fluids -Vasopressors as needed to maintain MAP goal -Decrease Solu-cortef  to 100 mg BID on 4/28 -Lactic acid has normalized -HS Troponin peaked at 199 -Diuresis as BP and renal function permits ~ holding due to shock -Continue Heparin  gtt for Anticoagulation  #Severe Sepsis due to ... #Left Lower extremity Cellulitis CT Left leg and foot with Diffuse SQ edema extending along the mid to distal left lower extremity through the forefoot, concerning for cellulitis.  No evidence for loculated fluid collections or acute osteolysis or erosive changes. -Monitor fever curve -Trend WBC's & Procalcitonin -Follow cultures as above -Continue empiric Vancomycin  and Zosyn pending cultures & sensitivities (plan for 7 day course)  #AKI  -Monitor I&O's / urinary output -Follow BMP -Ensure adequate renal perfusion -Avoid nephrotoxic agents as able -Replace electrolytes as indicated ~ Pharmacy following for assistance with electrolyte replacement -Nephrology following, appreciate input ~ continue CRRT as per Nephrology  #Acute Metabolic Encephalopathy PMHx: Dementia -Treatment of sepsis and metabolic derangements as outlined above -Provide supportive care -Promote normal sleep/wake cycle and family presence -Avoid sedating medications as able -Continue Seroquel  25 mg  qhs       Pt is critically ill with multiorgan failure. Prognosis is guarded, high risk for further decompensation, cardiac arrest, and death.  Given current critical illness superimposed on multiple chronic co-morbidities and advanced age, overall long term prognosis is poor.  Pt is DNR/DNI status.  Palliative Care is following to assist with GOC discussions.   Best Practice (right click and "Reselect all SmartList Selections" daily)   Diet/type: Regular consistency (see orders) DVT prophylaxis: systemic heparin  GI prophylaxis: N/A Lines: Dialysis Catheter and yes and it is still needed Foley:  Yes, and it is still needed Code Status:  DNR Last date of multidisciplinary goals of care discussion [4/29]  4/29: Pt updated, will update family when they  arrive at bedside on plan of care.  Labs   CBC: Recent Labs  Lab 07/10/23 0850 07/10/23 1540 07/11/23 0417 07/12/23 0357 07/13/23 0317 07/14/23 0409 07/15/23 0509  WBC 16.1*   < > 14.5* 17.4* 17.6* 12.6* 13.6*  NEUTROABS 14.1*  --   --   --   --   --   --   HGB 15.1   < > 13.9 14.0 13.9 12.3* 12.6*  HCT 44.3   < > 38.2* 40.0 39.4 35.2* 37.0*  MCV 93.9   < > 89.7 93.5 92.1 91.9 94.4  PLT 256   < > 215 226 208 188 175   < > = values in this interval not displayed.    Basic Metabolic Panel: Recent Labs  Lab 07/11/23 0417 07/11/23 1535 07/12/23 0357 07/12/23 1535 07/13/23 0317 07/13/23 1546 07/14/23 0409 07/14/23 1527 07/15/23 0509  NA 134*   < > 135   < > 137 133* 137 136 133*  K 4.5   < > 4.1   < > 4.2 3.8 4.1 3.6 4.1  CL 104   < > 103   < > 103 104 103 106 102  CO2 19*   < > 22   < > 22 24 23 24 23   GLUCOSE 124*   < > 158*   < > 132* 129* 111* 127* 92  BUN 64*   < > 20   < > 13 11 12 13 11   CREATININE 1.85*   < > 1.18   < > 0.99 1.06 1.07 1.15 1.10  CALCIUM  8.3*   < > 7.9*   < > 8.1* 7.9* 8.0* 8.1* 7.3*  MG 2.2  --  2.4  --  2.7*  --  2.5*  --  2.6*  PHOS 2.2*   < > 2.7   < > 2.2* 2.9 1.9* 3.0 1.6*   < > =  values in this interval not displayed.   GFR: Estimated Creatinine Clearance: 50.7 mL/min (by C-G formula based on SCr of 1.1 mg/dL). Recent Labs  Lab 07/10/23 0850 07/10/23 1039 07/10/23 1540 07/11/23 0417 07/12/23 0357 07/13/23 0317 07/14/23 0409 07/15/23 0509  WBC 16.1*  --  15.6*   < > 17.4* 17.6* 12.6* 13.6*  LATICACIDVEN 2.2* 2.8* 1.1  --   --   --   --   --    < > = values in this interval not displayed.    Liver Function Tests: Recent Labs  Lab 07/10/23 0850 07/11/23 0417 07/13/23 0317 07/13/23 1546 07/14/23 0409 07/14/23 1527 07/15/23 0509  AST 32  --   --   --   --   --   --   ALT 20  --   --   --   --   --   --   ALKPHOS 46  --   --   --   --   --   --   BILITOT 3.6*  --   --   --   --   --   --   PROT 6.5  --   --   --   --   --   --   ALBUMIN 3.5   < > 2.5* 2.4* 2.3* 2.3* 2.5*   < > = values in this interval not displayed.   Recent Labs  Lab 07/10/23 1540  LIPASE 60*  AMYLASE 77   No results for input(s): "AMMONIA" in the last 168 hours.  ABG  Component Value Date/Time   PHART 7.6 (H) 07/12/2023 0813   PCO2ART <18 (LL) 07/12/2023 0813   PO2ART 156 (H) 07/12/2023 0813   HCO3 27.8 07/14/2023 1334   ACIDBASEDEF 1.8 07/12/2023 0813   O2SAT 60 07/14/2023 1334     Coagulation Profile: Recent Labs  Lab 07/10/23 0850 07/10/23 1540  INR 1.5* 1.7*    Cardiac Enzymes: No results for input(s): "CKTOTAL", "CKMB", "CKMBINDEX", "TROPONINI" in the last 168 hours.  HbA1C: Hgb A1c MFr Bld  Date/Time Value Ref Range Status  10/09/2022 11:27 AM 5.8 (H) 4.8 - 5.6 % Final    Comment:             Prediabetes: 5.7 - 6.4          Diabetes: >6.4          Glycemic control for adults with diabetes: <7.0   03/31/2014 08:30 AM 5.5 <5.7 % Final    Comment:    (NOTE)                                                                       According to the ADA Clinical Practice Recommendations for 2011, when HbA1c is used as a screening test:  >=6.5%    Diagnostic of Diabetes Mellitus           (if abnormal result is confirmed) 5.7-6.4%   Increased risk of developing Diabetes Mellitus References:Diagnosis and Classification of Diabetes Mellitus,Diabetes Care,2011,34(Suppl 1):S62-S69 and Standards of Medical Care in         Diabetes - 2011,Diabetes Care,2011,34 (Suppl 1):S11-S61.     CBG: Recent Labs  Lab 07/10/23 1921 07/11/23 2239 07/12/23 2007 07/13/23 0000 07/13/23 0741  GLUCAP 125* 145* 129* 136* 111*    Review of Systems:   Unable to assess due to AMS   Past Medical History:  He,  has a past medical history of Actinic keratosis, AICD (automatic cardioverter/defibrillator) present (03/31/2014), Arthritis, Asthma, Basal cell carcinoma (08/29/2008), Basal cell carcinoma (01/15/2018), Basal cell carcinoma (01/15/2018), Cardiac arrest [facility]), Coronary artery disease, non-occlusive, Dementia (HCC), Dysplastic nevus (01/31/2020), HLD (hyperlipidemia), HTN (hypertension), Kidney stones, Melanoma (HCC), NICM (nonischemic cardiomyopathy) (HCC), Paroxysmal atrial fibrillation (HCC) (03/28/2015), and Retinal artery occlusion.   Surgical History:   Past Surgical History:  Procedure Laterality Date   adenomatous polyps     CARDIAC CATHETERIZATION  03/30/2014   CARDIOVERSION  03/30/2014   COLONOSCOPY WITH PROPOFOL  N/A 03/24/2017   Procedure: COLONOSCOPY WITH PROPOFOL ;  Surgeon: Deveron Fly, MD;  Location: [facility] ENDOSCOPY;  Service: Endoscopy;  Laterality: N/A;   COLONOSCOPY WITH PROPOFOL  N/A 01/07/2019   Procedure: COLONOSCOPY WITH PROPOFOL ;  Surgeon: Toledo, Alphonsus Jeans, MD;  Location: [facility] ENDOSCOPY;  Service: Gastroenterology;  Laterality: N/A;   CYSTOSCOPY W/ LITHOLAPAXY / [facility]     IMPLANTABLE CARDIOVERTER DEFIBRILLATOR IMPLANT N/A 03/31/2014   Procedure: IMPLANTABLE CARDIOVERTER DEFIBRILLATOR IMPLANT;  Surgeon: Tammie Fall, MD;  Location: [facility] CATH LAB;  Service: Cardiovascular;  Laterality: N/A;   JOINT REPLACEMENT      TONSILLECTOMY     "as a kid"   TOTAL KNEE ARTHROPLASTY Left ~ 2010   TOTAL KNEE ARTHROPLASTY WITH REVISION COMPONENTS Left ~ 2011   VARICOSE VEIN SURGERY Left ~ 2014     Social History:  reports that he has never smoked. He has never used smokeless tobacco. He reports that he does not drink alcohol and does not use drugs.   Family History:  His family history includes Colon polyps in his brother; Esophageal cancer in his mother; Heart attack in his father; Hypertension in his brother.   Allergies Allergies  Allergen Reactions   Entresto  [Sacubitril-Valsartan]     hallucinations     Home Medications  Prior to Admission medications   Medication Sig Start Date End Date Taking? Authorizing Provider  acetaminophen  (TYLENOL ) 650 MG CR tablet Take 1,300 mg by mouth every 8 (eight) hours as needed for pain.   Yes [provider]  atorvastatin  (LIPITOR) 10 MG tablet Take 1 tablet (10 mg total) by mouth daily. 10/02/22  Yes Tejan-Sie, S Ahmed, MD  Azelastine  HCl 137 MCG/SPRAY SOLN SPARY 1 SPRAY PER NARE DAILY 09/06/22  Yes Tejan-Sie, S Ahmed, MD  cetirizine  (ZYRTEC ) 10 MG tablet Take 1 tablet (10 mg total) by mouth daily. Take 1 tablet (10 mg) by mouth once daily 05/14/23 08/12/23 Yes Tejan-Sie, S Ahmed, MD  fluticasone -salmeterol (WIXELA INHUB ) 250-50 MCG/ACT AEPB TAKE 1 PUFF BY MOUTH TWICE A DAY 01/15/23  Yes Tejan-Sie, John Muzzy, MD  furosemide  (LASIX ) 40 MG tablet TAKE 1 TABLET BY MOUTH TWICE A DAY 2 DAYS A WEEK AND 1 TABLET ONCE A DAY ALL OTHER DAYS 06/16/23  Yes Verona Goodwill, MD  levothyroxine  (SYNTHROID ) 75 MCG tablet TAKE 1 TABLET BY MOUTH EVERY DAY BEFORE BREAKFAST 07/07/23  Yes Shari Daughters, MD  losartan  (COZAAR ) 25 MG tablet Take 1 tablet (25 mg total) by mouth daily. 05/14/23 08/12/23 Yes Tejan-Sie, S Ahmed, MD  Multiple Vitamin (MULTIVITAMIN WITH MINERALS) TABS tablet Take 1 tablet by mouth daily.   Yes [provider]  sotalol  (BETAPACE ) 80 MG tablet Take 1  tablet (80 mg total) by mouth 2 (two) times daily. 06/26/23  Yes Verona Goodwill, MD  spironolactone  (ALDACTONE ) 25 MG tablet Take 0.5 tablets (12.5 mg total) by mouth daily. 06/26/23  Yes Verona Goodwill, MD  triamcinolone  (NASACORT ) 55 MCG/ACT AERO nasal inhaler Place 1 spray into the nose 2 (two) times daily. Patient taking differently: Place 1 spray into the nose daily. 04/23/23 07/22/23 Yes Shari Daughters, MD  apixaban  (ELIQUIS ) 5 MG TABS tablet Take 1 tablet (5 mg total) by mouth 2 (two) times daily. 06/13/23   Verona Goodwill, MD  donepezil  (ARICEPT ) 5 MG tablet TAKE 1 TABLET BY MOUTH EVERYDAY AT BEDTIME 07/01/23   Shari Daughters, MD  ketoconazole (NIZORAL) 2 % cream Apply 1 Application topically 2 (two) times daily. Patient not taking: Reported on 07/10/2023 07/02/23   [provider]     Critical care time: 40 minutes     Cherylann Corpus, AGACNP-BC [facility] Prefer epic messenger for cross cover needs If after hours, please call E-link
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
## 2023-07-15 NOTE — Progress Notes (Signed)
 [facility]  ROUNDING NOTE   Subjective:   Remains ill appearing Alert and oriented to self  CRRT, -40ml/hr Levo was weaned earlier today Remains oliguric at this time.  04/28 0701 - 04/29 0700 In: 1648.5 [P.O.:190; I.V.:298.7; IV Piggyback:1159.8] Out: 2822 [Urine:81] Lab Results  Component Value Date   CREATININE 1.10 07/15/2023   CREATININE 1.15 07/14/2023   CREATININE 1.07 07/14/2023     Objective:  Vital signs in last 24 hours:  Temp:  [97.7 F (36.5 C)-98.7 F (37.1 C)] 98.7 F (37.1 C) (04/29 0800) Pulse Rate:  [35-141] 96 (04/29 1130) Resp:  [13-26] 26 (04/29 1130) BP: (68-125)/(39-101) 85/72 (04/29 1130) SpO2:  [93 %-100 %] 96 % (04/29 1130) Weight:  [82 kg] 82 kg (04/29 0320)  Weight change: -3.1 kg Filed Weights   07/13/23 0530 07/14/23 0320 07/15/23 0320  Weight: 83.7 kg 85.1 kg 82 kg    Intake/Output: I/O last 3 completed shifts: In: 2077.7 [P.O.:190; I.V.:552.5; IV Piggyback:1335.2] Out: 3831 [Urine:115]   Intake/Output this shift:  Total I/O In: 169.2 [P.O.:70; I.V.:36.4; IV Piggyback:62.9] Out: 279 [Urine:50]  Physical Exam: General: NAD  Head: Normocephalic, atraumatic. Moist oral mucosal membranes  Eyes: Anicteric  Lungs:  Clear to auscultation, normal effort  Heart: Regular rate and rhythm  Abdomen:  Soft, nontender  Extremities: trace peripheral edema.  Neurologic: Awake, alert, conversant  Skin: No lesions  Access: Rt femoral HD temp cath    Basic Metabolic Panel: Recent Labs  Lab 07/11/23 0417 07/11/23 1535 07/12/23 0357 07/12/23 1535 07/13/23 0317 07/13/23 1546 07/14/23 0409 07/14/23 1527 07/15/23 0509  NA 134*   < > 135   < > 137 133* 137 136 133*  K 4.5   < > 4.1   < > 4.2 3.8 4.1 3.6 4.1  CL 104   < > 103   < > 103 104 103 106 102  CO2 19*   < > 22   < > 22 24 23 24 23   GLUCOSE 124*   < > 158*   < > 132* 129* 111* 127* 92  BUN 64*   < > 20   < > 13 11 12 13 11   CREATININE 1.85*   < > 1.18   < >  0.99 1.06 1.07 1.15 1.10  CALCIUM  8.3*   < > 7.9*   < > 8.1* 7.9* 8.0* 8.1* 7.3*  MG 2.2  --  2.4  --  2.7*  --  2.5*  --  2.6*  PHOS 2.2*   < > 2.7   < > 2.2* 2.9 1.9* 3.0 1.6*   < > = values in this interval not displayed.    Liver Function Tests: Recent Labs  Lab 07/10/23 0850 07/11/23 0417 07/13/23 0317 07/13/23 1546 07/14/23 0409 07/14/23 1527 07/15/23 0509  AST 32  --   --   --   --   --   --   ALT 20  --   --   --   --   --   --   ALKPHOS 46  --   --   --   --   --   --   BILITOT 3.6*  --   --   --   --   --   --   PROT 6.5  --   --   --   --   --   --   ALBUMIN 3.5   < > 2.5* 2.4* 2.3* 2.3* 2.5*   < > =  values in this interval not displayed.   Recent Labs  Lab 07/10/23 1540  LIPASE 60*  AMYLASE 77   No results for input(s): "AMMONIA" in the last 168 hours.  CBC: Recent Labs  Lab 07/10/23 0850 07/10/23 1540 07/11/23 0417 07/12/23 0357 07/13/23 0317 07/14/23 0409 07/15/23 0509  WBC 16.1*   < > 14.5* 17.4* 17.6* 12.6* 13.6*  NEUTROABS 14.1*  --   --   --   --   --   --   HGB 15.1   < > 13.9 14.0 13.9 12.3* 12.6*  HCT 44.3   < > 38.2* 40.0 39.4 35.2* 37.0*  MCV 93.9   < > 89.7 93.5 92.1 91.9 94.4  PLT 256   < > 215 226 208 188 175   < > = values in this interval not displayed.    Cardiac Enzymes: No results for input(s): "CKTOTAL", "CKMB", "CKMBINDEX", "TROPONINI" in the last 168 hours.  BNP: Invalid input(s): "POCBNP"  CBG: Recent Labs  Lab 07/10/23 1921 07/11/23 2239 07/12/23 2007 07/13/23 0000 07/13/23 0741  GLUCAP 125* 145* 129* 136* 111*    Microbiology: Results for orders placed or performed during the hospital encounter of 07/10/23  Resp panel by RT-PCR (RSV, Flu A&B, Covid) Anterior Nasal Swab     Status: None   Collection Time: 07/10/23  8:50 AM   Specimen: Anterior Nasal Swab  Result Value Ref Range Status   SARS Coronavirus 2 by RT PCR NEGATIVE NEGATIVE Final    Comment: (NOTE) SARS-CoV-2 target nucleic acids are NOT  DETECTED.  The SARS-CoV-2 RNA is generally detectable in upper respiratory specimens during the acute phase of infection. The lowest concentration of SARS-CoV-2 viral copies this assay can detect is 138 copies/mL. A negative result does not preclude SARS-Cov-2 infection and should not be used as the sole basis for treatment or other patient management decisions. A negative result may occur with  improper specimen collection/handling, submission of specimen other than nasopharyngeal swab, presence of viral mutation(s) within the areas targeted by this assay, and inadequate number of viral copies(<138 copies/mL). A negative result must be combined with clinical observations, patient history, and epidemiological information. The expected result is Negative.  Fact Sheet for Patients:  BloggerCourse.com  Fact Sheet for Healthcare Providers:  SeriousBroker.it  This test is no t yet approved or cleared by the [place]  FDA and  has been authorized for detection and/or diagnosis of SARS-CoV-2 by FDA under an Emergency Use Authorization (EUA). This EUA will remain  in effect (meaning this test can be used) for the duration of the COVID-19 declaration under Section 564(b)(1) of the Act, 21 U.S.C.section 360bbb-3(b)(1), unless the authorization is terminated  or revoked sooner.       Influenza A by PCR NEGATIVE NEGATIVE Final   Influenza B by PCR NEGATIVE NEGATIVE Final    Comment: (NOTE) The Xpert Xpress SARS-CoV-2/FLU/RSV plus assay is intended as an aid in the diagnosis of influenza from Nasopharyngeal swab specimens and should not be used as a sole basis for treatment. Nasal washings and aspirates are unacceptable for Xpert Xpress SARS-CoV-2/FLU/RSV testing.  Fact Sheet for Patients: BloggerCourse.com  Fact Sheet for Healthcare Providers: SeriousBroker.it  This test is not yet  approved or cleared by the [place]  FDA and has been authorized for detection and/or diagnosis of SARS-CoV-2 by FDA under an Emergency Use Authorization (EUA). This EUA will remain in effect (meaning this test can be used) for the duration of the COVID-19 declaration under Section  564(b)(1) of the Act, 21 U.S.C. section 360bbb-3(b)(1), unless the authorization is terminated or revoked.     Resp Syncytial Virus by PCR NEGATIVE NEGATIVE Final    Comment: (NOTE) Fact Sheet for Patients: BloggerCourse.com  Fact Sheet for Healthcare Providers: SeriousBroker.it  This test is not yet approved or cleared by the [place]  FDA and has been authorized for detection and/or diagnosis of SARS-CoV-2 by FDA under an Emergency Use Authorization (EUA). This EUA will remain in effect (meaning this test can be used) for the duration of the COVID-19 declaration under Section 564(b)(1) of the Act, 21 U.S.C. section 360bbb-3(b)(1), unless the authorization is terminated or revoked.  Performed at [facility], [address] [place], [place] [address]   Blood Culture (routine x 2)     Status: None   Collection Time: 07/10/23  8:50 AM   Specimen: BLOOD  Result Value Ref Range Status   Specimen Description BLOOD RIGHT [facility]  Final   Special Requests   Final    BOTTLES DRAWN AEROBIC AND ANAEROBIC Blood Culture results may not be optimal due to an inadequate volume of blood received in culture bottles   Culture   Final    NO GROWTH 5 DAYS Performed at [facility] [address] [place], [place] [address]    Report Status 07/15/2023 FINAL  Final  Blood Culture (routine x 2)     Status: None   Collection Time: 07/10/23  8:05 PM   Specimen: BLOOD RIGHT HAND  Result Value Ref Range Status   Specimen Description BLOOD RIGHT HAND  Final   Special Requests   Final    BOTTLES DRAWN AEROBIC AND ANAEROBIC Blood Culture results may  not be optimal due to an inadequate volume of blood received in culture bottles   Culture   Final    NO GROWTH 5 DAYS Performed at [facility], [address], [place], [place] [address]    Report Status 07/15/2023 FINAL  Final  MRSA Next Gen by PCR, Nasal     Status: None   Collection Time: 07/10/23  8:35 PM   Specimen: Nasal Mucosa; Nasal Swab  Result Value Ref Range Status   MRSA by PCR Next Gen NOT DETECTED NOT DETECTED Final    Comment: (NOTE) The GeneXpert MRSA Assay (FDA approved for NASAL specimens only), is one component of a comprehensive MRSA colonization surveillance program. It is not intended to diagnose MRSA infection nor to guide or monitor treatment for MRSA infections. Test performance is not FDA approved in patients less than 58 years old. Performed at [facility], [address] [place], [place] [address]     Coagulation Studies: No results for input(s): "LABPROT", "INR" in the last 72 hours.   Urinalysis: No results for input(s): "COLORURINE", "LABSPEC", "PHURINE", "GLUCOSEU", "HGBUR", "BILIRUBINUR", "KETONESUR", "PROTEINUR", "UROBILINOGEN", "NITRITE", "LEUKOCYTESUR" in the last 72 hours.  Invalid input(s): "APPERANCEUR"     Imaging: No results found.    Medications:    heparin  900 Units/hr (07/15/23 1100)   norepinephrine  (LEVOPHED ) Adult infusion Stopped (07/15/23 0722)   piperacillin-tazobactam Stopped (07/15/23 0535)   prismasol  BGK 4/2.5 400 mL/hr at 07/15/23 0029   prismasol  BGK 4/2.5 400 mL/hr at 07/15/23 0029   prismasol  BGK 4/2.5 2,000 mL/hr at 07/15/23 1034   sodium PHOSPHATE IVPB (in mmol) 43 mL/hr at 07/15/23 1100   vancomycin  Stopped (07/14/23 1829)    vitamin C  500 mg Oral BID   Chlorhexidine  Gluconate Cloth  6 each Topical  Daily   feeding supplement  237 mL Oral TID BM   hydrocortisone  sod succinate (SOLU-CORTEF ) inj  100 mg Intravenous Q12H   leptospermum manuka honey  1 Application Topical Daily    levothyroxine   75 mcg Oral Q0600   lidocaine   1 patch Transdermal Q24H   liver oil-zinc  oxide   Topical BID   multivitamin  1 tablet Oral QHS   mouth rinse  15 mL Mouth Rinse 4 times per day   pneumococcal 20-valent conjugate vaccine  0.5 mL Intramuscular Tomorrow-1000   QUEtiapine   25 mg Oral QHS   sodium chloride  flush  10-40 mL Intracatheter Q12H   thiamine  (VITAMIN B1) injection  100 mg Intravenous Daily   docusate sodium , heparin , mouth rinse, sodium chloride  flush  Assessment/ Plan:  Mr. Ethan Vega. is a 78 y.o.  male  with a PMHx of hypertension, nonischemic cardiomyopathy, paroxysmal atrial fibrillation, history of ventricular tachycardia, hyperlipidemia, history of AICD placement, history of skin cancer, dementia, who was admitted to [facility] on 07/10/2023 for Cellulitis of left foot [L03.116] Septic shock (HCC) [A41.9, R65.21] Sepsis with acute renal failure and septic shock, due to unspecified organism, unspecified acute renal failure type (HCC) [A41.9, R65.21, N17.9] Acute renal failure superimposed on chronic kidney disease, unspecified acute renal failure type, unspecified CKD stage (HCC) [N17.9, N18.9]   Acute kidney injury/chronic kidney disease stage IIIb baseline EGFR 39 on 06/26/23/hyperkalemia.  Patient now with acute kidney injury secondary to ATN most likely.  Patient has had rather poor p.o. intake at home over the past several days prior to admission according to the daughter.  CT scan abdomen pelvis negative for obstruction.  Given severity of acute kidney injury recommend initiation of continuous renal placement therapy.  Appreciate critical care placing right femoral temporary dialysis catheter.  Urine output 81cc in preceding 24 hours.  Volume status has improved, will transition patient to net even with CRRT. Since pressors have been weaned, will consider stopping CRRT this evening. Will continue to monitor and plan for iHD tomorrow.   04/28 0701 - 04/29 0700 In:  1648.5 [P.O.:190; I.V.:298.7; IV Piggyback:1159.8] Out: 2822 [Urine:81] Lab Results  Component Value Date   CREATININE 1.10 07/15/2023   CREATININE 1.15 07/14/2023   CREATININE 1.07 07/14/2023      2.  Acute metabolic acidosis.  Serum bicarbonate managed with CRRT.   3.  Hypotension.  Weaned off of Levo this morning.     LOS: 5 Rajat Staver 4/29/202511:53 AM

## 2023-07-15 NOTE — Progress Notes (Signed)
 PT Cancellation Note  Patient Details Name: Ethan Vega. MRN: [id number] DOB: 11-19-1945   Cancelled Treatment:    Reason Eval/Treat Not Completed: Other (comment). Pt remains on CRRT, plans to discontinue tonight and transition to HD tomorrow. Will continue to follow, unable to evaluate while on CRRT.   Michalene Debruler 07/15/2023, 1:04 PM Amparo Balk, PT, DPT, [facility] [phone]

## 2023-07-16 DIAGNOSIS — R6521 Severe sepsis with septic shock: Secondary | ICD-10-CM | POA: Diagnosis not present

## 2023-07-16 DIAGNOSIS — A419 Sepsis, unspecified organism: Secondary | ICD-10-CM | POA: Diagnosis not present

## 2023-07-16 DIAGNOSIS — L03116 Cellulitis of left lower limb: Secondary | ICD-10-CM | POA: Diagnosis not present

## 2023-07-16 DIAGNOSIS — N171 Acute kidney failure with acute cortical necrosis: Secondary | ICD-10-CM | POA: Diagnosis not present

## 2023-07-16 LAB — CBC
HCT: 36.1 % — ABNORMAL LOW (ref 39.0–52.0)
Hemoglobin: 12.1 g/dL — ABNORMAL LOW (ref 13.0–17.0)
MCH: 32.4 pg (ref 26.0–34.0)
MCHC: 33.5 g/dL (ref 30.0–36.0)
MCV: 96.8 fL (ref 80.0–100.0)
Platelets: 144 10*3/uL — ABNORMAL LOW (ref 150–400)
RBC: 3.73 MIL/uL — ABNORMAL LOW (ref 4.22–5.81)
RDW: 15.8 % — ABNORMAL HIGH (ref 11.5–15.5)
WBC: 11 10*3/uL — ABNORMAL HIGH (ref 4.0–10.5)
nRBC: 0.2 % (ref 0.0–0.2)

## 2023-07-16 LAB — MAGNESIUM: Magnesium: 2.6 mg/dL — ABNORMAL HIGH (ref 1.7–2.4)

## 2023-07-16 LAB — RENAL FUNCTION PANEL
Albumin: 2.3 g/dL — ABNORMAL LOW (ref 3.5–5.0)
Anion gap: 10 (ref 5–15)
BUN: 20 mg/dL (ref 8–23)
CO2: 23 mmol/L (ref 22–32)
Calcium: 8.2 mg/dL — ABNORMAL LOW (ref 8.9–10.3)
Chloride: 104 mmol/L (ref 98–111)
Creatinine, Ser: 1.48 mg/dL — ABNORMAL HIGH (ref 0.61–1.24)
GFR, Estimated: 48 mL/min — ABNORMAL LOW (ref >60)
Glucose, Bld: 96 mg/dL (ref 70–99)
Phosphorus: 4.4 mg/dL (ref 2.5–4.6)
Potassium: 4.1 mmol/L (ref 3.5–5.1)
Sodium: 137 mmol/L (ref 135–145)

## 2023-07-16 LAB — HEPARIN LEVEL (UNFRACTIONATED): Heparin Unfractionated: 0.44 [IU]/mL (ref 0.30–0.70)

## 2023-07-16 MED ORDER — PANCRELIPASE (LIP-PROT-AMYL) 12000-38000 UNITS PO CPEP
24000.0000 [IU] | ORAL_CAPSULE | Freq: Three times a day (TID) | ORAL | Status: DC
  Administered 2023-07-16 – 2023-08-01 (×44): 24000 [IU] via ORAL
  Filled 2023-07-16 (×43): qty 2

## 2023-07-16 MED ORDER — ORAL CARE MOUTH RINSE: 15.0000 mL | OROMUCOSAL | Status: DC | PRN

## 2023-07-16 MED ORDER — MIDODRINE HCL 5 MG PO TABS
5.0000 mg | ORAL_TABLET | Freq: Three times a day (TID) | ORAL | Status: DC
  Administered 2023-07-16 (×2): 5 mg via ORAL
  Filled 2023-07-16 (×2): qty 1

## 2023-07-16 MED ORDER — MIDODRINE HCL 5 MG PO TABS
10.0000 mg | ORAL_TABLET | Freq: Three times a day (TID) | ORAL | Status: DC
  Administered 2023-07-16 – 2023-07-17 (×4): 10 mg via ORAL
  Filled 2023-07-16 (×4): qty 2

## 2023-07-16 MED ORDER — ORAL CARE MOUTH RINSE
15.0000 mL | OROMUCOSAL | Status: DC
  Administered 2023-07-16 – 2023-07-17 (×3): 15 mL via OROMUCOSAL

## 2023-07-16 MED ORDER — ORAL CARE MOUTH RINSE: 15.0000 mL | OROMUCOSAL | Status: DC

## 2023-07-16 NOTE — Evaluation (Signed)
 Physical Therapy Evaluation Patient Details Name: Gunnarr Belt. MRN: [id number] DOB: Apr 30, 1945 Today's Date: 07/16/2023  History of Present Illness  Patient is a 78 year old male with renal failure, toxic metabolic encephalopathy, septic shock with LE cellulitis. Required CRRT and pressor support. History of HTN, atrial fibrillation, ventricular tachycardia, AICD placement, dementia.  Clinical Impression  Patient is agreeable to PT evaluation. He is confused but cooperative during session with history of dementia. Patient lives at home alone with recent difficulty with ADLs and adherence with using rolling walker per family report. Daughter drives to appointments and provides food.  Today the patient required Max A for bed mobility. Sitting balance is poor with external support required. No dizziness reported with upright activity with MAP 78. He is fatigued with minimal activity and has generalized weakness. Recommend to continue PT to maximize independence and decrease caregiver burden. Rehabilitation < 3 hours/day recommended after this hospital stay.       If plan is discharge home, recommend the following: Two people to help with walking and/or transfers;Two people to help with bathing/dressing/bathroom;Assist for transportation;Help with stairs or ramp for entrance;Supervision due to cognitive status;Assistance with cooking/housework;Direct supervision/assist for medications management;Direct supervision/assist for financial management   Can travel by private vehicle   No    Equipment Recommendations None recommended by PT  Recommendations for Other Services  OT consult    Functional Status Assessment Patient has had a recent decline in their functional status and demonstrates the ability to make significant improvements in function in a reasonable and predictable amount of time.     Precautions / Restrictions Precautions Precautions: Fall Recall of Precautions/Restrictions:  Impaired Precaution/Restrictions Comments: right femoral temporary triple lumen, okay to mobilize per secure chat with nephrology      Mobility  Bed Mobility Overal bed mobility: Needs Assistance Bed Mobility: Supine to Sit, Sit to Supine     Supine to sit: Max assist, HOB elevated, Used rails Sit to supine: Max assist, HOB elevated, Used rails   General bed mobility comments: assistance for LE and trunk support. cues for task initiation    Transfers                   General transfer comment: not attempted due to poor sitting balance and fatigue with minimal activity    Ambulation/Gait                  Stairs            Wheelchair Mobility     Tilt Bed    Modified Rankin (Stroke Patients Only)       Balance Overall balance assessment: Needs assistance Sitting-balance support: Feet unsupported, Single extremity supported Sitting balance-Leahy Scale: Poor Sitting balance - Comments: external support required to maintain sitting balance with posterior lean Postural control: Posterior lean                                   Pertinent Vitals/Pain Pain Assessment Pain Assessment: No/denies pain    Home Living Family/patient expects to be discharged to:: Private residence Living Arrangements: Alone Available Help at Discharge: Family;Available PRN/intermittently Type of Home: House           Home Equipment: Rolling Walker (2 wheels)      Prior Function Prior Level of Function : Patient poor historian/Family not available  Mobility Comments: chart indicates patient has a rolling walker but does not always use it ADLs Comments: difficulty with ADLs in the home. daughter drives to appointments and provides food     Extremity/Trunk Assessment   Upper Extremity Assessment Upper Extremity Assessment: Generalized weakness (edema noted LUE)    Lower Extremity Assessment Lower Extremity Assessment:  Generalized weakness       Communication   Communication Communication: No apparent difficulties    Cognition Arousal: Alert Behavior During Therapy: WFL for tasks assessed/performed   PT - Cognitive impairments: History of cognitive impairments                       PT - Cognition Comments: patient is oriented to self and place. he is disoriented to time and situation. Following commands: Impaired Following commands impaired: Follows one step commands with increased time     Cueing Cueing Techniques: Verbal cues, Tactile cues     General Comments General comments (skin integrity, edema, etc.): patient fatigued with minimal activity. MAP 78 while sitting with no dizziness reported with activity    Exercises     Assessment/Plan    PT Assessment Patient needs continued PT services  PT Problem List Decreased strength;Decreased range of motion;Decreased activity tolerance;Decreased balance;Decreased mobility;Decreased cognition;Decreased safety awareness       PT Treatment Interventions Gait training;DME instruction;Stair training;Functional mobility training;Therapeutic activities;Therapeutic exercise;Balance training;Neuromuscular re-education;Cognitive remediation;Patient/family education;Wheelchair mobility training    PT Goals (Current goals can be found in the Care Plan section)  Acute Rehab PT Goals Patient Stated Goal: to get his memory back and improve strength PT Goal Formulation: With patient Time For Goal Achievement: 07/30/23 Potential to Achieve Goals: Fair    Frequency Min 2X/week     Co-evaluation               AM-PAC PT "6 Clicks" Mobility  Outcome Measure Help needed turning from your back to your side while in a flat bed without using bedrails?: A Lot Help needed moving from lying on your back to sitting on the side of a flat bed without using bedrails?: A Lot Help needed moving to and from a bed to a chair (including a wheelchair)?:  Total Help needed standing up from a chair using your arms (e.g., wheelchair or bedside chair)?: Total Help needed to walk in hospital room?: Total Help needed climbing 3-5 steps with a railing? : Total 6 Click Score: 8    End of Session   Activity Tolerance: Patient limited by fatigue Patient left: in bed;with call bell/phone within reach;with SCD's reapplied (heel lift boots in place) Nurse Communication: Mobility status PT Visit Diagnosis: Unsteadiness on feet (R26.81);Muscle weakness (generalized) (M62.81)    Time: 3086-5784 PT Time Calculation (min) (ACUTE ONLY): 17 min   Charges:   PT Evaluation $PT Eval High Complexity: 1 High   PT General Charges $$ ACUTE PT VISIT: 1 Visit         Ozie Bo, PT, MPT   Erlene Hawks 07/16/2023, 9:32 AM

## 2023-07-16 NOTE — Evaluation (Signed)
 Occupational Therapy Evaluation Patient Details Name: Ethan Vega. MRN: [id number] DOB: 02-May-1945 Today's Date: 07/16/2023   History of Present Illness   Patient is a 78 year old male with renal failure, toxic metabolic encephalopathy, septic shock with LE cellulitis. Required CRRT and pressor support. History of HTN, atrial fibrillation, ventricular tachycardia, AICD placement, dementia.     Clinical Impressions Chart reviewed, pt greeted in bed, oriented to self only. Pt follows one step commands with increased time and multi modal cues. Pt is a poor historian, will need to confirm PLOF but he reports he is indep PTA and then states he "needs help with everything". Pt presents with deficits in strength, endurance, activity tolerance, balance, cognition, affecting safe and optimal ADL completion. MAX A required for bed mobility, MAX A for LB dressing, MIN A for grooming tasks. Pt is fatigued with approx 2 minutes of static sitting balance and returned to supine. Edema noted in LUE, elevated with positioning. Pt is left as received, all needs met. OT will follow acutely to facilitate optimal ADL performance/functional mobility.   BP in semi supine 94/59 (MAP 69) HR 91 bpm  BP after mobility 80/50 (MAP 61) HR 98 bpm  BP after 1 minute later 89/57 (MAP 69) HR 83 bpm  HR to 126 bpm while sitting on edge of bed     If plan is discharge home, recommend the following:   A lot of help with walking and/or transfers;A lot of help with bathing/dressing/bathroom;Supervision due to cognitive status;Assist for transportation;Help with stairs or ramp for entrance     Functional Status Assessment   Patient has had a recent decline in their functional status and demonstrates the ability to make significant improvements in function in a reasonable and predictable amount of time.     Equipment Recommendations   Other (comment) (defer to next venue of care)     Recommendations for Other  Services         Precautions/Restrictions   Precautions Precautions: Fall Recall of Precautions/Restrictions: Impaired Precaution/Restrictions Comments: right femoral temporary triple lumen Restrictions Weight Bearing Restrictions Per Provider Order: No     Mobility Bed Mobility Overal bed mobility: Needs Assistance Bed Mobility: Supine to Sit, Sit to Supine     Supine to sit: Max assist, HOB elevated, Used rails Sit to supine: Max assist, HOB elevated, Used rails   General bed mobility comments: frequent multi modal cues for technique, pt endorses he is afraid of falling    Transfers                          Balance Overall balance assessment: Needs assistance Sitting-balance support: Feet unsupported, Single extremity supported Sitting balance-Leahy Scale: Poor   Postural control: Posterior lean                                 ADL either performed or assessed with clinical judgement   ADL Overall ADL's : Needs assistance/impaired     Grooming: Minimal assistance;Bed level               Lower Body Dressing: Maximal assistance;Total assistance;Bed level Lower Body Dressing Details (indicate cue type and reason): donn socks, doff/donn prevlon boots     Toileting- Clothing Manipulation and Hygiene: Maximal assistance;Bed level               Vision   Additional Comments: will continue  to assess     Perception         Praxis         Pertinent Vitals/Pain Pain Assessment Pain Assessment: CPOT Facial Expression: Relaxed, neutral Body Movements: Absence of movements Muscle Tension: Tense, rigid Compliance with ventilator (intubated pts.): N/A Vocalization (extubated pts.): Talking in normal tone or no sound CPOT Total: 1 Pain Intervention(s): Monitored during session     Extremity/Trunk Assessment Upper Extremity Assessment Upper Extremity Assessment: Generalized weakness (edemous LUE)   Lower Extremity  Assessment Lower Extremity Assessment: Generalized weakness       Communication Communication Communication: No apparent difficulties   Cognition Arousal: Alert Behavior During Therapy: WFL for tasks assessed/performed Cognition: Cognition impaired   Orientation impairments: Place, Time, Situation Awareness: Intellectual awareness impaired, Online awareness impaired Memory impairment (select all impairments): Short-term memory, Working [employer] fast streamer, Copywriter, advertising, Engineer, structural memory Attention impairment (select first level of impairment): Sustained attention Executive functioning impairment (select all impairments): Reasoning, Problem solving OT - Cognition Comments: Pt states "I am not sure why I am here", endorses he is in the hospital after cueing                 Following commands: Impaired Following commands impaired: Follows one step commands with increased time     Cueing  General Comments   Cueing Techniques: Verbal cues;Tactile cues;Gestural cues;Visual cues  all lines/leads, femoral access intact pre/post session   Exercises Other Exercises Other Exercises: edu re: role of OT, role of rehab, discharge recommendations   Shoulder Instructions      Home Living Family/patient expects to be discharged to:: Private residence Living Arrangements: Alone Available Help at Discharge: Family;Available PRN/intermittently Type of Home: House                       Home Equipment: Agricultural consultant (2 wheels)          Prior Functioning/Environment Prior Level of Function : Patient poor historian/Family not available               ADLs Comments: will need to confirm, pt reports he lives alone, performs ADLS indep but then also states he needs help for"everything"    OT Problem List: Decreased strength;Decreased activity tolerance;Decreased knowledge of use of DME or AE;Decreased safety awareness;Impaired balance (sitting and/or  standing);Decreased cognition;Increased edema   OT Treatment/Interventions: Self-care/ADL training;Visual/perceptual remediation/compensation;Therapeutic exercise;Patient/family education;Balance training;Energy conservation;Therapeutic activities;DME and/or AE instruction      OT Goals(Current goals can be found in the care plan section)   Acute Rehab OT Goals Patient Stated Goal: figure out what is going on OT Goal Formulation: With patient Time For Goal Achievement: 07/30/23 Potential to Achieve Goals: Fair ADL Goals Pt Will Perform Grooming: with supervision;sitting Pt Will Perform Lower Body Dressing: with min assist;sitting/lateral leans;sit to/from stand Pt Will Transfer to Toilet: with min assist;ambulating Pt Will Perform Toileting - Clothing Manipulation and hygiene: with min assist;sitting/lateral leans;sit to/from stand   OT Frequency:  Min 2X/week    Co-evaluation              AM-PAC OT "6 Clicks" Daily Activity     Outcome Measure Help from another person eating meals?: A Lot Help from another person taking care of personal grooming?: A Little Help from another person toileting, which includes using toliet, bedpan, or urinal?: A Lot Help from another person bathing (including washing, rinsing, drying)?: A Lot Help from another person to put on and taking off regular  upper body clothing?: A Little Help from another person to put on and taking off regular lower body clothing?: A Lot 6 Click Score: 14   End of Session Nurse Communication: Mobility status  Activity Tolerance: Patient tolerated treatment well Patient left: in bed;with call bell/phone within reach;with bed alarm set  OT Visit Diagnosis: Other abnormalities of gait and mobility (R26.89);Muscle weakness (generalized) (M62.81)                Time: 1610-9604 OT Time Calculation (min): 21 min Charges:  OT General Charges $OT Visit: 1 Visit OT Evaluation $OT Eval Moderate Complexity: 1 Mod  Gerre Kraft, OTD OTR/L  07/16/23, 2:03 PM

## 2023-07-16 NOTE — Progress Notes (Signed)
 [facility]  ROUNDING NOTE   Subjective:   Patient sitting up in bed Eating breakfast Alert and oriented to self No pressors, soft BP  Improved urine output  04/29 0701 - 04/30 0700 In: 705.9 [P.O.:180; I.V.:215.9; IV Piggyback:310.1] Out: 985 [Urine:380] Lab Results  Component Value Date   CREATININE 1.48 (H) 07/16/2023   CREATININE 1.02 07/15/2023   CREATININE 1.10 07/15/2023     Objective:  Vital signs in last 24 hours:  Temp:  [98 F (36.7 C)-99 F (37.2 C)] 98 F (36.7 C) (04/30 0800) Pulse Rate:  [62-138] 76 (04/30 1200) Resp:  [12-26] 13 (04/30 1200) BP: (78-109)/(46-83) 89/64 (04/30 1200) SpO2:  [91 %-100 %] 97 % (04/30 1200) Weight:  [83.9 kg] 83.9 kg (04/30 0345)  Weight change: 1.9 kg Filed Weights   07/14/23 0320 07/15/23 0320 07/16/23 0345  Weight: 85.1 kg 82 kg 83.9 kg    Intake/Output: I/O last 3 completed shifts: In: 1263.6 [P.O.:180; I.V.:357.7; IV Piggyback:726] Out: 2246 [Urine:426]   Intake/Output this shift:  Total I/O In: 69.4 [I.V.:44.9; IV Piggyback:24.5] Out: 175 [Urine:175]  Physical Exam: General: NAD  Head: Normocephalic, atraumatic. Moist oral mucosal membranes  Eyes: Anicteric  Lungs:  Clear to auscultation, normal effort  Heart: Regular rate and rhythm  Abdomen:  Soft, nontender  Extremities: trace peripheral edema.  Neurologic: Awake, alert, conversant  Skin: No lesions  Access: Rt femoral HD temp cath    Basic Metabolic Panel: Recent Labs  Lab 07/12/23 0357 07/12/23 1535 07/13/23 0317 07/13/23 1546 07/14/23 0409 07/14/23 1527 07/15/23 0509 07/15/23 1640 07/16/23 0326  NA 135   < > 137   < > 137 136 133* 138 137  K 4.1   < > 4.2   < > 4.1 3.6 4.1 4.2 4.1  CL 103   < > 103   < > 103 106 102 102 104  CO2 22   < > 22   < > 23 24 23 24 23   GLUCOSE 158*   < > 132*   < > 111* 127* 92 96 96  BUN 20   < > 13   < > 12 13 11 13 20   CREATININE 1.18   < > 0.99   < > 1.07 1.15 1.10 1.02 1.48*  CALCIUM   7.9*   < > 8.1*   < > 8.0* 8.1* 7.3* 7.9* 8.2*  MG 2.4  --  2.7*  --  2.5*  --  2.6*  --  2.6*  PHOS 2.7   < > 2.2*   < > 1.9* 3.0 1.6* 3.9 4.4   < > = values in this interval not displayed.    Liver Function Tests: Recent Labs  Lab 07/10/23 0850 07/11/23 0417 07/14/23 0409 07/14/23 1527 07/15/23 0509 07/15/23 1640 07/16/23 0326  AST 32  --   --   --   --   --   --   ALT 20  --   --   --   --   --   --   ALKPHOS 46  --   --   --   --   --   --   BILITOT 3.6*  --   --   --   --   --   --   PROT 6.5  --   --   --   --   --   --   ALBUMIN 3.5   < > 2.3* 2.3* 2.5* 2.4* 2.3*   < > =  values in this interval not displayed.   Recent Labs  Lab 07/10/23 1540  LIPASE 60*  AMYLASE 77   No results for input(s): "AMMONIA" in the last 168 hours.  CBC: Recent Labs  Lab 07/10/23 0850 07/10/23 1540 07/12/23 0357 07/13/23 0317 07/14/23 0409 07/15/23 0509 07/16/23 0326  WBC 16.1*   < > 17.4* 17.6* 12.6* 13.6* 11.0*  NEUTROABS 14.1*  --   --   --   --   --   --   HGB 15.1   < > 14.0 13.9 12.3* 12.6* 12.1*  HCT 44.3   < > 40.0 39.4 35.2* 37.0* 36.1*  MCV 93.9   < > 93.5 92.1 91.9 94.4 96.8  PLT 256   < > 226 208 188 175 144*   < > = values in this interval not displayed.    Cardiac Enzymes: No results for input(s): "CKTOTAL", "CKMB", "CKMBINDEX", "TROPONINI" in the last 168 hours.  BNP: Invalid input(s): "POCBNP"  CBG: Recent Labs  Lab 07/11/23 2239 07/12/23 2007 07/13/23 0000 07/13/23 0741 07/15/23 1707  GLUCAP 145* 129* 136* 111* 98    Microbiology: Results for orders placed or performed during the hospital encounter of 07/10/23  Resp panel by RT-PCR (RSV, Flu A&B, Covid) Anterior Nasal Swab     Status: None   Collection Time: 07/10/23  8:50 AM   Specimen: Anterior Nasal Swab  Result Value Ref Range Status   SARS Coronavirus 2 by RT PCR NEGATIVE NEGATIVE Final    Comment: (NOTE) SARS-CoV-2 target nucleic acids are NOT DETECTED.  The SARS-CoV-2 RNA is  generally detectable in upper respiratory specimens during the acute phase of infection. The lowest concentration of SARS-CoV-2 viral copies this assay can detect is 138 copies/mL. A negative result does not preclude SARS-Cov-2 infection and should not be used as the sole basis for treatment or other patient management decisions. A negative result may occur with  improper specimen collection/handling, submission of specimen other than nasopharyngeal swab, presence of viral mutation(s) within the areas targeted by this assay, and inadequate number of viral copies(<138 copies/mL). A negative result must be combined with clinical observations, patient history, and epidemiological information. The expected result is Negative.  Fact Sheet for Patients:  BloggerCourse.com  Fact Sheet for Healthcare Providers:  SeriousBroker.it  This test is no t yet approved or cleared by the [place]  FDA and  has been authorized for detection and/or diagnosis of SARS-CoV-2 by FDA under an Emergency Use Authorization (EUA). This EUA will remain  in effect (meaning this test can be used) for the duration of the COVID-19 declaration under Section 564(b)(1) of the Act, 21 U.S.C.section 360bbb-3(b)(1), unless the authorization is terminated  or revoked sooner.       Influenza A by PCR NEGATIVE NEGATIVE Final   Influenza B by PCR NEGATIVE NEGATIVE Final    Comment: (NOTE) The Xpert Xpress SARS-CoV-2/FLU/RSV plus assay is intended as an aid in the diagnosis of influenza from Nasopharyngeal swab specimens and should not be used as a sole basis for treatment. Nasal washings and aspirates are unacceptable for Xpert Xpress SARS-CoV-2/FLU/RSV testing.  Fact Sheet for Patients: BloggerCourse.com  Fact Sheet for Healthcare Providers: SeriousBroker.it  This test is not yet approved or cleared by the [place] FDA and has been authorized for detection and/or diagnosis of SARS-CoV-2 by FDA under an Emergency Use Authorization (EUA). This EUA will remain in effect (meaning this test can be used) for the duration of the COVID-19 declaration under Section  564(b)(1) of the Act, 21 U.S.C. section 360bbb-3(b)(1), unless the authorization is terminated or revoked.     Resp Syncytial Virus by PCR NEGATIVE NEGATIVE Final    Comment: (NOTE) Fact Sheet for Patients: BloggerCourse.com  Fact Sheet for Healthcare Providers: SeriousBroker.it  This test is not yet approved or cleared by the [place]  FDA and has been authorized for detection and/or diagnosis of SARS-CoV-2 by FDA under an Emergency Use Authorization (EUA). This EUA will remain in effect (meaning this test can be used) for the duration of the COVID-19 declaration under Section 564(b)(1) of the Act, 21 U.S.C. section 360bbb-3(b)(1), unless the authorization is terminated or revoked.  Performed at [facility], [address] [place], [place] [address]   Blood Culture (routine x 2)     Status: None   Collection Time: 07/10/23  8:50 AM   Specimen: BLOOD  Result Value Ref Range Status   Specimen Description BLOOD RIGHT [facility]  Final   Special Requests   Final    BOTTLES DRAWN AEROBIC AND ANAEROBIC Blood Culture results may not be optimal due to an inadequate volume of blood received in culture bottles   Culture   Final    NO GROWTH 5 DAYS Performed at [facility], [address] [place], [place] [address]    Report Status 07/15/2023 FINAL  Final  Blood Culture (routine x 2)     Status: None   Collection Time: 07/10/23  8:05 PM   Specimen: BLOOD RIGHT HAND  Result Value Ref Range Status   Specimen Description BLOOD RIGHT HAND  Final   Special Requests   Final    BOTTLES DRAWN AEROBIC AND ANAEROBIC Blood Culture results may not be optimal due to an  inadequate volume of blood received in culture bottles   Culture   Final    NO GROWTH 5 DAYS Performed at [facility], [address], [place], [place] [address]    Report Status 07/15/2023 FINAL  Final  MRSA Next Gen by PCR, Nasal     Status: None   Collection Time: 07/10/23  8:35 PM   Specimen: Nasal Mucosa; Nasal Swab  Result Value Ref Range Status   MRSA by PCR Next Gen NOT DETECTED NOT DETECTED Final    Comment: (NOTE) The GeneXpert MRSA Assay (FDA approved for NASAL specimens only), is one component of a comprehensive MRSA colonization surveillance program. It is not intended to diagnose MRSA infection nor to guide or monitor treatment for MRSA infections. Test performance is not FDA approved in patients less than 26 years old. Performed at [facility], [address] [place], [place] [address]     Coagulation Studies: No results for input(s): "LABPROT", "INR" in the last 72 hours.   Urinalysis: No results for input(s): "COLORURINE", "LABSPEC", "PHURINE", "GLUCOSEU", "HGBUR", "BILIRUBINUR", "KETONESUR", "PROTEINUR", "UROBILINOGEN", "NITRITE", "LEUKOCYTESUR" in the last 72 hours.  Invalid input(s): "APPERANCEUR"     Imaging: No results found.    Medications:    heparin  900 Units/hr (07/16/23 1200)   norepinephrine  (LEVOPHED ) Adult infusion Stopped (07/15/23 0722)   piperacillin-tazobactam (ZOSYN)  IV 12.5 mL/hr at 07/16/23 1200    vitamin C  500 mg Oral BID   Chlorhexidine  Gluconate Cloth  6 each Topical Q0600   feeding supplement  237 mL Oral TID BM   leptospermum manuka honey  1 Application Topical Daily   levothyroxine   75 mcg Oral Q0600   lidocaine   1 patch Transdermal Q24H   liver oil-zinc  oxide  Topical BID   midodrine  5 mg Oral TID WC   multivitamin  1 tablet Oral QHS   mouth rinse  15 mL Mouth Rinse 4 times per day   pneumococcal 20-valent conjugate vaccine  0.5 mL Intramuscular Tomorrow-1000   QUEtiapine   25 mg Oral QHS    sodium chloride  flush  10-40 mL Intracatheter Q12H   thiamine  (VITAMIN B1) injection  100 mg Intravenous Daily   docusate sodium , mouth rinse, sodium chloride  flush  Assessment/ Plan:  Ethan Vega. is a 78 y.o.  male  with a PMHx of hypertension, nonischemic cardiomyopathy, paroxysmal atrial fibrillation, history of ventricular tachycardia, hyperlipidemia, history of AICD placement, history of skin cancer, dementia, who was admitted to [facility] on 07/10/2023 for Cellulitis of left foot [L03.116] Septic shock (HCC) [A41.9, R65.21] Sepsis with acute renal failure and septic shock, due to unspecified organism, unspecified acute renal failure type (HCC) [A41.9, R65.21, N17.9] Acute renal failure superimposed on chronic kidney disease, unspecified acute renal failure type, unspecified CKD stage (HCC) [N17.9, N18.9]   Acute kidney injury/chronic kidney disease stage IIIb baseline EGFR 39 on 06/26/23/hyperkalemia.  Patient now with acute kidney injury secondary to ATN most likely.  Patient has had rather poor p.o. intake at home over the past several days prior to admission according to the daughter.  CT scan abdomen pelvis negative for obstruction.  Given severity of acute kidney injury recommend initiation of continuous renal placement therapy.  Appreciate critical care placing right femoral temporary dialysis catheter.  CRRT stopped yesterday evening. Urine output has improved. Was considering dialysis today however blood pressure remains soft. Will defer dialysis until tomorrow.   04/29 0701 - 04/30 0700 In: 705.9 [P.O.:180; I.V.:215.9; IV Piggyback:310.1] Out: 985 [Urine:380] Lab Results  Component Value Date   CREATININE 1.48 (H) 07/16/2023   CREATININE 1.02 07/15/2023   CREATININE 1.10 07/15/2023      2.  Acute metabolic acidosis.  Serum bicarbonate corrected. Will continue to monitor  3.  Hypotension.  Weaned off of Levo. BP remains soft. Prescribed Midodrine 5mg  three times daily.  Will continue to monitor.      LOS: 6 Tatsuo Musial 4/30/202512:19 PM

## 2023-07-16 NOTE — Hospital Course (Addendum)
 Hospital course / significant events:  Ethan Vega is a 78 year old male with a history of essential hypertension, nonischemic cardiomyopathy history of paroxysmal A-fib and ventricular tachycardia, dyslipidemia. He reports diarrhea over the past week. Patient currently lives alone in his home and was previously able to perform all activities of daily living. He is unaware of the open score and cellulitic lesion of his left foot.   Initially presented for elective procedure to have ICD generator change however he was noted to have open sores on his left lower extremity and was demonstrating confusion and encephalopathy - decision to abort procedure and reschedule. He was subsequently sent to the ER.   His workup revealed severe AKI and leukocytosis and vitals with shock physiology. Patient received IV fluid resuscitation but despite 3 L required Levophed  support. Kept inpatient for abx. Also developed GI bleed d/t gastric and duodenal ulcers w/ eliquis . Eliquis  held. EGD 05/07 found clean based gastric and duodenal ulcers, had more bleeding night of 5/9 CTA bleeding scan neg.  EGD 5/10 found healing ulcers. Needing PRBC transfusion 05/12 but Hgb stable since then (if dropping per GI may need to consider colonoscopy) - Hgb remains stable into 05/16 and placement confirmed     Consultants:  Gastroenterology  Podiatry  Nephrology Palliative Care   Procedures/Surgeries: 07/23/23 EGD 07/26/23: EGD     ASSESSMENT & PLAN:   Upper GI bleed 2/2 gastric and duodenal ulcer Eliquis  contributed to GI bleed  EGD found clean based gastric and duodenal ulcers. cont soft diet advance as tolerates cont PPI Hold anticoagulation Monitor CBC every few days / per SNF attending  GI has s/o at this time, reengage as needed   Hypotension cont midodrine    Acute blood loss anemia s/p 5u pRBC so far monitor Hgb and transfuse to keep Hgb >7 See above re: GIB   Septic shock secondary to left foot  infection, complicated by adrenal insufficiency. Left foot ulcer with cellulitis - nearly healed / healing normally. Elevated troponin secondary to septic shock. Adrenal insufficiency. Blood Culture has no growth. CT scan of the foot did not show any bony involvement. He was seen by podiatry, no need for debridement.   Patient was treated with vancomycin , completed dose.  Then on Zosyn  to Augmentin  for additional 3 days to complete 10-day course.   tapering steroids    Paroxysmal atrial fibrillation. Nonsustained ventricular tachycardia. Nonischemic cardiomyopathy with chronic systolic congestive heart failure with EF 30-35%. hold anticoagulation Hold beta blocker d/t soft BP, heart rate has been at goal    Failure to thrive. Anorexia, improving Chronic diarrhea. No longer has diarrhea on Creon , continue this. started Protonix , megestrol .   Acute kidney injury chronic kidney disease stage IIIa. Hyponatremia. Followed by nephrology, briefly needing dialysis.  Renal function had improved.     Monitor BMP   Hyperkalemia Lokelma  PRN Monitor BMP   Right foot pain US  DVT studies neg for DVT in BLE   Acute metabolic encephalopathy. Chronic dementia. Condition appears to be stable Delirium precautions.   Debility. PT/OT recommended SNF rehab, TOC following      Pressure ulcer POA Pressure Injury 07/11/23 Buttocks Left Stage 2 -  Partial thickness loss of dermis presenting as a shallow open injury with a red, pink wound bed without slough. (Active)  07/11/23 1049  Location: Buttocks  Location Orientation: Left  Staging: Stage 2 -  Partial thickness loss of dermis presenting as a shallow open injury with a red, pink wound bed without slough.  Wound  Description (Comments):   Present on Admission: Yes     Pressure Injury 07/11/23 Buttocks Medial Stage 2 -  Partial thickness loss of dermis presenting as a shallow open injury with a red, pink wound bed without slough. (Active)   07/11/23 1100  Location: Buttocks  Location Orientation: Medial  Staging: Stage 2 -  Partial thickness loss of dermis presenting as a shallow open injury with a red, pink wound bed without slough.  Wound Description (Comments):   Present on Admission: Yes   wound care continue       Class 1 obesity based on BMI: Body mass index is 33.7 kg/m.[name] Significantly low or high BMI is associated with higher medical risk.  Underweight - under 18  overweight - 25 to 29 obese - 30 or more Class 1 obesity: BMI of 30.0 to 34 Class 2 obesity: BMI of 35.0 to 39 Class 3 obesity: BMI of 40.0 to 49 Super Morbid Obesity: BMI 50-59 Super-super Morbid Obesity: BMI 60+ Healthy nutrition and physical activity advised as adjunct to other disease management and risk reduction treatments    DVT prophylaxis: holding given GI bleed IV fluids: no continuous IV fluids  Nutrition: regular Central lines / other devices: none  Code Status: DNR ACP documentation reviewed:  none on file in [facility]  TOC needs: placement  Medical barriers to dispo: Expected medical readiness for discharge tomorrow/Friday if Hgb remains stable / no bleeding.

## 2023-07-16 NOTE — Consult Note (Addendum)
 PHARMACY CONSULT NOTE - ELECTROLYTES  Pharmacy Consult for Electrolyte Monitoring and Replacement   Recent Labs: Height: 5\' 3"  (160 cm) Weight: 83.9 kg (184 lb 15.5 oz) IBW/kg (Calculated) : 56.9 Estimated Creatinine Clearance: 38.1 mL/min (A) (by C-G formula based on SCr of 1.48 mg/dL (H)). Potassium (mmol/L)  Date Value  07/16/2023 4.1  07/06/2014 4.2   Magnesium (mg/dL)  Date Value  19/14/7829 2.6 (H)   Calcium  (mg/dL)  Date Value  56/21/3086 8.2 (L)   Calcium , Total (mg/dL)  Date Value  57/84/6962 9.1   Albumin (g/dL)  Date Value  95/28/4132 2.3 (L)  01/15/2023 4.3  03/30/2014 3.5   Phosphorus (mg/dL)  Date Value  44/03/270 4.4   Sodium (mmol/L)  Date Value  07/16/2023 137  06/26/2023 140  07/06/2014 141   Assessment  Ethan Vega. is a 78 y.o. male presenting with septic shock. PMH significant for HTN, cardiomyopathy, Afib, and HLD. Pharmacy has been consulted to monitor and replace electrolytes.  Goal of Therapy: Electrolytes WNL  Plan:  --No electrolyte replacement indicated at this time --CRRT stopped 4/29 evening. Plan is to switch to [facility] per nephrology. Switch to daily labs for now --Patient care transferred from PCCM to TRH. Will discontinue electrolyte consult at this time. Defer further ordering of labs and electrolyte replacement to primary team --Pharmacy will monitor peripherally  Thank you for allowing pharmacy to be a part of this patient's care.  Elvin Banker B Mayu Ronk 07/16/2023 7:25 AM

## 2023-07-16 NOTE — Progress Notes (Signed)
 Progress Note   Patient: Ethan Vega. ZOX:[id number] DOB: 1945-05-13 DOA: 07/10/2023     6 DOS: the patient was seen and examined on 07/16/2023   Brief hospital course: Chiron Umsted is a 78 year old male with a history of essential hypertension, nonischemic cardiomyopathy history of paroxysmal A-fib and ventricular tachycardia, dyslipidemia who was seen earlier today with electrophysiology service to have ICD generator change however he was noted to have open sores on his left lower extremity and was mentating with confusion and encephalopathy with decision to abort procedure and reschedule. He was subsequently sent to the ER. His workup revealed severe AKI and leukocytosis and vitals with shock physiology. Patient received IV fluid resuscitation but despite 3 L of intravenous fluids continues to require Levophed  support. He reports diarrhea over the past week. Patient currently lives alone in his home and was previously able to perform all activities of daily living. He is unaware of the open score and cellulitic lesion of his left foot.    Principal Problem:   Septic shock (HCC) Active Problems:   Acute renal failure superimposed on chronic kidney disease (HCC)   Cellulitis of left foot   Pressure injury of skin   Toxic metabolic encephalopathy   Assessment and Plan: Septic shock secondary to left foot infection. Left foot ulcer with cellulitis. Elevated troponin secondary to septic shock. Patient had received IV fluid bolus, he also received Levophed .  Currently off Levophed , blood pressure is borderline low, added midodrine.  Also check cortisol level. Blood Culture has no growth. Patient was treated with vancomycin , completed dose.  Currently on Zosyn. Foot cellulitis appear to be improving, but I will get podiatry consult for foot ulcer. CT scan of the foot did not show any bony involvement.   Paroxysmal atrial fibrillation. Nonsustained ventricular tachycardia. Nonischemic  cardiomyopathy with chronic systolic congestive heart failure with EF 30-35%. Appears to be euvolemic.  On heparin  drip.  Will continue heparin  until podiatry make decision whether patient need debridement.  Acute kidney injury chronic kidney disease stage IIIa. Hyponatremia. Followed by nephrology, briefly needing dialysis.  Renal function has improved.  Continue to follow.  Acute metabolic encephalopathy. Chronic dementia. Patient has been followed  Chronic diarrhea. 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  Debility. PT/OT recommended nursing home placement.  Class 1 obesity. BMI 32.77     Subjective:  Complaining of some loose stool, very poor appetite.  No shortness of breath.  Physical Exam: Vitals:   07/16/23 1033 07/16/23 1100 07/16/23 1200 07/16/23 1300  BP: (!) 89/57 (!) 91/55 (!) 89/64 99/64  Pulse: 84 91 76 64  Resp: 15 13 13 15   Temp:      TempSrc:      SpO2: 98% 97% 97% 98%  Weight:      Height:       General exam: Appears calm and comfortable  Respiratory system: Clear to auscultation. Respiratory effort normal. Cardiovascular system: S1 & S2 heard, RRR. No JVD, murmurs, rubs, gallops or clicks. No pedal edema. Gastrointestinal system: Abdomen is nondistended, soft and nontender. No organomegaly or masses felt. Normal bowel sounds heard. Central nervous system: Alert and oriented x2. No focal neurological deficits. Extremities: Symmetric 5 x 5 power. Skin: No rashes, lesions or ulcers Psychiatry: Mood & affect appropriate.    Data Reviewed:  Reviewed CT scan results, lab results.  Family Communication: Daughter updated over the phone.  Disposition: Status is: Inpatient Remains inpatient appropriate because: Severity of disease, IV treatment.     Time spent:  50 minutes  Author: Donaciano Frizzle, MD 07/16/2023 3:05 PM  For on call review [email or web address].

## 2023-07-16 NOTE — Progress Notes (Signed)
 PHARMACY - ANTICOAGULATION CONSULT NOTE  Pharmacy Consult for heparin  infusion Indication: atrial fibrillation  Allergies  Allergen Reactions   Entresto  [Sacubitril-Valsartan]     hallucinations    Patient Measurements: Height: 5\' 3"  (160 cm) Weight: 83.9 kg (184 lb 15.5 oz) IBW/kg (Calculated) : 56.9 HEPARIN  DW (KG): 77  Vital Signs: Temp: 98.5 F (36.9 C) (04/30 0200) Temp Source: Axillary (04/30 0200) BP: 100/57 (04/30 0430) Pulse Rate: 85 (04/30 0430)  Labs: Recent Labs    07/14/23 0409 07/14/23 1334 07/14/23 2221 07/15/23 0509 07/15/23 1640 07/16/23 0326  HGB 12.3*  --   --  12.6*  --  12.1*  HCT 35.2*  --   --  37.0*  --  36.1*  PLT 188  --   --  175  --  144*  HEPARINUNFRC 0.29*   < > 0.49 0.44  --  0.44  CREATININE 1.07   < >  --  1.10 1.02 1.48*   < > = values in this interval not displayed.    Estimated Creatinine Clearance: 38.1 mL/min (A) (by C-G formula based on SCr of 1.48 mg/dL (H)).   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 03/2014 s/p SJM 2411-36C dual chamber AICD (serial Number [id number])- followed by Dr. Rodolfo Clan   Arthritis    a. L knee.   Asthma    Basal cell carcinoma 08/29/2008   Vertex scalp. Keratotic pattern, ulcerated.   Basal cell carcinoma 01/15/2018   Left distal medial thigh near knee. Fibroepithelioma of pinkus type   Basal cell carcinoma 01/15/2018   Right distal lat. nose ant. inferior edge. Nodular pattern.   Cardiac arrest (HCC)    a. 03/30/2014 VF arrest in setting of NICM >> CPR/defib in community >> s/p dual chamber AICD   Coronary artery disease, non-occlusive    a. cath 03/2014 at [facility]: no sig CAD (OM1 30%, CFX 30%), EF 35%   Dementia (HCC)    Dysplastic nevus 01/31/2020   L mid back paraspinal - severe, excision 03/21/2020   HLD (hyperlipidemia)    HTN (hypertension)    Kidney stones    Melanoma (HCC)     Melanoma resected from scalp   NICM (nonischemic cardiomyopathy) (HCC)    a. 03/2014 Echo:  Inferolateral and lateral HK, EF 50-55%, grade 1 diastolic dysfunction, mild MR, mild LAE, mild RAE, trivial TR, no effusion; 09/2018 Echo: EF 35-40%, impaired relaxation, glob HK, sev apical HK. Nl RV fxn. RVSP . Mildly dil LA.   Paroxysmal atrial fibrillation (HCC) 03/28/2015   Retinal artery occlusion     Medications:  Scheduled:   vitamin C  500 mg Oral BID   Chlorhexidine  Gluconate Cloth  6 each Topical Q0600   feeding supplement  237 mL Oral TID BM   leptospermum manuka honey  1 Application Topical Daily   levothyroxine   75 mcg Oral Q0600   lidocaine   1 patch Transdermal Q24H   liver oil-zinc  oxide   Topical BID   multivitamin  1 tablet Oral QHS   mouth rinse  15 mL Mouth Rinse 4 times per day   pneumococcal 20-valent conjugate vaccine  0.5 mL Intramuscular Tomorrow-1000   QUEtiapine   25 mg Oral QHS   sodium chloride  flush  10-40 mL Intracatheter Q12H   thiamine  (VITAMIN B1) injection  100 mg Intravenous Daily    Assessment: 78 year old male w/ PMH of HTN, nonischemic cardiomyopathy, atrial fibrillation,  ventricular tachycardia, HLD, dementia admitted with circulatory shock with  AKI. Noted to be on apixaban  PTA with last dose 07/08/23  Goal of Therapy:  Anti-Xa level 0.3-0.7 units/ml   Monitor platelets by anticoagulation protocol: Yes   Plan: Anti-Xa level therapeutic x 4, Plts still trending down: baseline 234, now 144 - Continue heparin  infusion at 900 units/hr - Recheck HL daily w/ AM labs while therapeutic - CBC daily  Coretta Dexter, PharmD, [facility] 07/16/2023 5:06 AM

## 2023-07-17 ENCOUNTER — Inpatient Hospital Stay

## 2023-07-17 DIAGNOSIS — N171 Acute kidney failure with acute cortical necrosis: Secondary | ICD-10-CM | POA: Diagnosis not present

## 2023-07-17 DIAGNOSIS — L03116 Cellulitis of left lower limb: Secondary | ICD-10-CM | POA: Diagnosis not present

## 2023-07-17 DIAGNOSIS — R630 Anorexia: Secondary | ICD-10-CM | POA: Insufficient documentation

## 2023-07-17 DIAGNOSIS — A419 Sepsis, unspecified organism: Secondary | ICD-10-CM | POA: Diagnosis not present

## 2023-07-17 DIAGNOSIS — E2740 Unspecified adrenocortical insufficiency: Secondary | ICD-10-CM | POA: Diagnosis not present

## 2023-07-17 DIAGNOSIS — N179 Acute kidney failure, unspecified: Secondary | ICD-10-CM | POA: Diagnosis not present

## 2023-07-17 DIAGNOSIS — R627 Adult failure to thrive: Secondary | ICD-10-CM | POA: Insufficient documentation

## 2023-07-17 DIAGNOSIS — E66811 Obesity, class 1: Secondary | ICD-10-CM | POA: Insufficient documentation

## 2023-07-17 LAB — COMPREHENSIVE METABOLIC PANEL WITH GFR
ALT: 39 U/L (ref 0–44)
AST: 31 U/L (ref 15–41)
Albumin: 2.3 g/dL — ABNORMAL LOW (ref 3.5–5.0)
Alkaline Phosphatase: 41 U/L (ref 38–126)
Anion gap: 7 (ref 5–15)
BUN: 30 mg/dL — ABNORMAL HIGH (ref 8–23)
CO2: 23 mmol/L (ref 22–32)
Calcium: 8.4 mg/dL — ABNORMAL LOW (ref 8.9–10.3)
Chloride: 106 mmol/L (ref 98–111)
Creatinine, Ser: 1.96 mg/dL — ABNORMAL HIGH (ref 0.61–1.24)
GFR, Estimated: 34 mL/min — ABNORMAL LOW (ref >60)
Glucose, Bld: 97 mg/dL (ref 70–99)
Potassium: 4.6 mmol/L (ref 3.5–5.1)
Sodium: 136 mmol/L (ref 135–145)
Total Bilirubin: 1.5 mg/dL — ABNORMAL HIGH (ref 0.0–1.2)
Total Protein: 4.8 g/dL — ABNORMAL LOW (ref 6.5–8.1)

## 2023-07-17 LAB — HEPARIN LEVEL (UNFRACTIONATED): Heparin Unfractionated: 0.46 [IU]/mL (ref 0.30–0.70)

## 2023-07-17 LAB — CBC
HCT: 37 % — ABNORMAL LOW (ref 39.0–52.0)
Hemoglobin: 12.5 g/dL — ABNORMAL LOW (ref 13.0–17.0)
MCH: 32.6 pg (ref 26.0–34.0)
MCHC: 33.8 g/dL (ref 30.0–36.0)
MCV: 96.6 fL (ref 80.0–100.0)
Platelets: 153 10*3/uL (ref 150–400)
RBC: 3.83 MIL/uL — ABNORMAL LOW (ref 4.22–5.81)
RDW: 15.7 % — ABNORMAL HIGH (ref 11.5–15.5)
WBC: 10.5 10*3/uL (ref 4.0–10.5)
nRBC: 0 % (ref 0.0–0.2)

## 2023-07-17 LAB — CORTISOL-AM, BLOOD: Cortisol - AM: 3.5 ug/dL — ABNORMAL LOW (ref 6.7–22.6)

## 2023-07-17 LAB — MAGNESIUM: Magnesium: 2.4 mg/dL (ref 1.7–2.4)

## 2023-07-17 MED ORDER — AMOXICILLIN-POT CLAVULANATE 500-125 MG PO TABS
1.0000 | ORAL_TABLET | Freq: Two times a day (BID) | ORAL | Status: AC
  Administered 2023-07-17 – 2023-07-20 (×8): 1 via ORAL
  Filled 2023-07-17 (×8): qty 1

## 2023-07-17 MED ORDER — SODIUM CHLORIDE 0.9 % IV SOLN: INTRAVENOUS | Status: AC

## 2023-07-17 MED ORDER — APIXABAN 2.5 MG PO TABS
2.5000 mg | ORAL_TABLET | Freq: Two times a day (BID) | ORAL | Status: DC
  Administered 2023-07-17 – 2023-07-22 (×11): 2.5 mg via ORAL
  Filled 2023-07-17 (×11): qty 1

## 2023-07-17 MED ORDER — PREDNISONE 10 MG PO TABS
5.0000 mg | ORAL_TABLET | Freq: Every day | ORAL | Status: DC
Start: 2023-07-17 — End: 2023-08-01
  Administered 2023-07-17 – 2023-08-01 (×15): 5 mg via ORAL
  Filled 2023-07-17 (×15): qty 1

## 2023-07-17 MED ORDER — MEGESTROL ACETATE 400 MG/10ML PO SUSP
400.0000 mg | Freq: Every day | ORAL | Status: DC
  Administered 2023-07-18 – 2023-07-31 (×12): 400 mg via ORAL
  Filled 2023-07-17 (×15): qty 10

## 2023-07-17 MED ORDER — COLLAGENASE 250 UNIT/GM EX OINT
TOPICAL_OINTMENT | Freq: Every day | CUTANEOUS | Status: DC
  Administered 2023-07-19: 1 via TOPICAL
  Filled 2023-07-17: qty 30

## 2023-07-17 MED ORDER — PANTOPRAZOLE SODIUM 40 MG PO TBEC
40.0000 mg | DELAYED_RELEASE_TABLET | Freq: Every day | ORAL | Status: DC
  Administered 2023-07-17 – 2023-07-22 (×6): 40 mg via ORAL
  Filled 2023-07-17 (×6): qty 1

## 2023-07-17 NOTE — Progress Notes (Signed)
 Nutrition Follow Up Note   DOCUMENTATION CODES:   Obesity unspecified  INTERVENTION:   Recommend NGT placement and nutrition support if patient's oral intake does not improve   Ensure Enlive po TID, each supplement provides 350 kcal and 20 grams of protein.  Magic cup TID with meals, each supplement provides 290 kcal and 9 grams of protein  Rena-vit po daily   Vitamin C  500mg  po BID  Thiamine  100mg  po daily x 7 days  Pt remains at high refeed risk; recommend monitor potassium, magnesium and phosphorus labs daily until stable  Daily weights   NUTRITION DIAGNOSIS:   Inadequate oral intake related to acute illness as evidenced by per patient/family report. -ongoing   GOAL:   Patient will meet greater than or equal to 90% of their needs -not met   MONITOR:   PO intake, Supplement acceptance, Labs, Weight trends, I & O's, Skin  ASSESSMENT:   78 y/o male with h/o dementia, CAD, cardiac arrest s/p AICD, HTN, HLD, PAF, chronic wounds and CKD who is admitted with septic shock, AKI and AMS.  Pt with poor appetite and oral intake in hospital. Pt eating <25% of meals but is drinking some Ensure. Pt with poor appetite and oral intake pta as well. Pt has now been without adequate nutrition for 7 days. Spoke with MD, will start appetite stimulant today. Recommend NGT placement and nutrition support if pt's oral intake remains poor. Pt remains at high refeed risk. Per chart, pt is down ~7lbs from his UBW. Pt -1.4L on his I & Os. CRRT stopped yesterday; plan is for intermittent HD.    Medications reviewed and include: vitamin C , synthroid , creon , rena-vit, thiamine , heparin   Labs reviewed: K 4.6 wnl, BUN 30(H), creat 1.96(H), Mg 2.4 wnl P 4.4 wnl- 4/30  UOP-   Diet Order:   Diet Order             DIET DYS 3 Room service appropriate? Yes with Assist; Fluid consistency: Thin  Diet effective now                  EDUCATION NEEDS:   Education needs have been  addressed  Skin:  Skin Assessment: Reviewed RN Assessment (full thickness wounds scattered near the rectum and inner gluteal fold, MASD sacrum and buttocks, left anterior foot with full thickness wound, Left lower anterior calf with scattered areas of full thickness wounds)  Last BM:  5/1- type 6  Height:   Ht Readings from Last 1 Encounters:  07/10/23 5\' 3"  (1.6 m)    Weight:   Wt Readings from Last 1 Encounters:  07/17/23 87.2 kg    Ideal Body Weight:  56.3 kg  BMI:  Body mass index is 34.05 kg/m.  Estimated Nutritional Needs:   Kcal:  2000-2300kcal/day  Protein:  100-115g/day  Fluid:  1.5-1.7L/day  Torrance Freestone MS, RD, LDN If unable to be reached, please send secure chat to "RD inpatient" available from 8:00a-4:00p daily

## 2023-07-17 NOTE — Progress Notes (Signed)
 Palliative Care Progress Note, Assessment & Plan   Patient Name: Ethan Vega.       Date: 07/17/2023 DOB: 01-13-1946  Age: 78 y.o. MRN#: [id number] Attending Physician: Donaciano Frizzle, MD Primary Care Physician: Shari Daughters, MD Admit Date: 07/10/2023  Subjective: Patient is sitting up in bed, sleeping, in no apparent distress.  He awakens to my presence.  He remains alert only to self.  No family or friends present during my visit.  HPI: 78 y.o. male  with past medical history of  essential hypertension, nonischemic cardiomyopathy history of paroxysmal A-fib and ventricular tachycardia, dyslipidemia. He presented to hospital 07/10/23 to have ICD generator changed. Staff found him to have open wounds to LLE, confusion and hypotension. He was subsequently sent to ED for evaluation. ED workup found patient to have AKI, leukocytosis and hypotension with creatinine 5.5 (baseline 1.7), Na+ 134, K+ 5.4, BUN 151, BNP 672.1, Trop 164, Lactic 2.2 and WBC 16.1.   He received IV fluid resuscitation but required pressor support. Daughter at bedside reported that patient lives alone, performing his ADLs independently. She adds that he complained of diarrhea and poor PO intake 2 weeks prior. Pt was unaware of the open sore to his LLE but did confirm chronic lower extremity pitting edema. He was admitted to ICU for circulatory shock with AKI.    Palliative care was consulted to discuss goals of care.  Summary of counseling/coordination of care: Extensive chart review completed prior to meeting patient including labs, vital signs, imaging, progress notes, orders, and available advanced directive documents from current and previous encounters.   After reviewing the patient's chart and assessing the patient at bedside,  patient remains unable to participate in goals of care medical decision making independently at this time.  I spoke with patient's daughter Ethan Vega over the phone.  Medical update given.  Discussed patient's poor p.o. intake, dementia, CRRT, and overall plan of care.  Reviewed patient's functional, nutritional, and cognitive status as significant indicators of patient's overall prognosis.  Ethan Vega endorses patient was having significant cognitive issues PTA.  She endorses his dementia had already set in prior to this hospitalization and that hospitalization seems to have created "good and bad days".  Reviewed that dementia is a progressive, chronic, and irreversible disease that is often exacerbated by acute illness and hospitalizations.    Education provided on adrenal insufficiency, cortisol levels, and use of steroid mint.  Space and opportunity provided for Ethan Vega to ask questions and voiced her concerns.  Discussed that nephrology continues to follow patient closely and that dialysis has been held again today.  Patient is having some urine output, which is promising.  She was appreciative of this update.  PMT will continue to follow and support patient and family throughout his hospitalization.  No adjustment to plan of care at this time.    Physical Exam Vitals reviewed.  Constitutional:      General: He is not in acute distress.    Appearance: He is normal weight. He is not ill-appearing.  HENT:     Head: Normocephalic.     Mouth/Throat:     Mouth: Mucous membranes are dry.  Eyes:     Pupils:  Pupils are equal, round, and reactive to light.  Cardiovascular:     Pulses: Normal pulses.  Pulmonary:     Effort: Pulmonary effort is normal.  Abdominal:     Palpations: Abdomen is soft.  Skin:    General: Skin is warm and dry.     Coloration: Skin is pale.  Neurological:     Mental Status: He is alert.     Comments: Oriented to self  Psychiatric:        Behavior: Behavior normal.              Total Time 35 minutes   Time spent includes: Detailed review of medical records (labs, imaging, vital signs), medically appropriate exam (mental status, respiratory, cardiac, skin), discussed with treatment team, counseling and educating patient, family and staff, documenting clinical information, medication management and coordination of care.  Ethan Vega L. Rebbeca Campi, DNP, FNP-BC Palliative Medicine Team

## 2023-07-17 NOTE — Progress Notes (Signed)
 PHARMACY - ANTICOAGULATION CONSULT NOTE  Pharmacy Consult for heparin  infusion Indication: atrial fibrillation  Allergies  Allergen Reactions   Entresto  [Sacubitril-Valsartan]     hallucinations    Patient Measurements: Height: 5\' 3"  (160 cm) Weight: 83.9 kg (184 lb 15.5 oz) IBW/kg (Calculated) : 56.9 HEPARIN  DW (KG): 77  Vital Signs: Temp: 97.9 F (36.6 C) (04/30 1925) Temp Source: Oral (04/30 1925) BP: 99/62 (05/01 0500) Pulse Rate: 84 (05/01 0500)  Labs: Recent Labs    07/15/23 0509 07/15/23 1640 07/16/23 0326 07/17/23 0421  HGB 12.6*  --  12.1* 12.5*  HCT 37.0*  --  36.1* 37.0*  PLT 175  --  144* 153  HEPARINUNFRC 0.44  --  0.44 0.46  CREATININE 1.10 1.02 1.48* 1.96*    Estimated Creatinine Clearance: 28.8 mL/min (A) (by C-G formula based on SCr of 1.96 mg/dL (H)).   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 03/2014 s/p SJM 2411-36C dual chamber AICD (serial Number [id number])- followed by Dr. Rodolfo Clan   Arthritis    a. L knee.   Asthma    Basal cell carcinoma 08/29/2008   Vertex scalp. Keratotic pattern, ulcerated.   Basal cell carcinoma 01/15/2018   Left distal medial thigh near knee. Fibroepithelioma of pinkus type   Basal cell carcinoma 01/15/2018   Right distal lat. nose ant. inferior edge. Nodular pattern.   Cardiac arrest (HCC)    a. 03/30/2014 VF arrest in setting of NICM >> CPR/defib in community >> s/p dual chamber AICD   Coronary artery disease, non-occlusive    a. cath 03/2014 at [facility]: no sig CAD (OM1 30%, CFX 30%), EF 35%   Dementia (HCC)    Dysplastic nevus 01/31/2020   L mid back paraspinal - severe, excision 03/21/2020   HLD (hyperlipidemia)    HTN (hypertension)    Kidney stones    Melanoma (HCC)    Melanoma resected from scalp   NICM (nonischemic cardiomyopathy) (HCC)    a. 03/2014 Echo:  Inferolateral and lateral HK, EF 50-55%, grade  1 diastolic dysfunction, mild MR, mild LAE, mild RAE, trivial TR, no effusion; 09/2018 Echo: EF 35-40%, impaired relaxation, glob HK, sev apical HK. Nl RV fxn. RVSP . Mildly dil LA.   Paroxysmal atrial fibrillation (HCC) 03/28/2015   Retinal artery occlusion     Medications:  Scheduled:   vitamin C   500 mg Oral BID   Chlorhexidine  Gluconate Cloth  6 each Topical Q0600   feeding supplement  237 mL Oral TID BM   leptospermum manuka honey  1 Application Topical Daily   levothyroxine   75 mcg Oral Q0600   lidocaine   1 patch Transdermal Q24H   lipase/protease/amylase  24,000 Units Oral TID AC   liver oil-zinc  oxide   Topical BID   midodrine   10 mg Oral TID WC   multivitamin  1 tablet Oral QHS   mouth rinse  15 mL Mouth Rinse 4 times per day   pneumococcal 20-valent conjugate vaccine  0.5 mL Intramuscular Tomorrow-1000   QUEtiapine   25 mg Oral QHS   sodium chloride  flush  10-40 mL Intracatheter Q12H   thiamine  (VITAMIN B1) injection  100 mg Intravenous Daily    Assessment: 78 year old male w/ PMH of HTN, nonischemic cardiomyopathy, atrial fibrillation,  ventricular tachycardia, HLD, dementia admitted with circulatory shock with  AKI. Noted to be on apixaban  PTA with last dose 07/08/23  Goal of Therapy:  Anti-Xa  level 0.3-0.7 units/ml   Monitor platelets by anticoagulation protocol: Yes   Plan: Anti-Xa level therapeutic x 5, Plts stabilizing  - Continue heparin  infusion at 900 units/hr - Recheck HL daily w/ AM labs while therapeutic - CBC daily  Coretta Dexter, PharmD, [facility] 07/17/2023 5:54 AM

## 2023-07-17 NOTE — Progress Notes (Signed)
 Physical Therapy Treatment Patient Details Name: Ethan Vega. MRN: [id number] DOB: 1945/10/19 Today's Date: 07/17/2023   History of Present Illness Patient is a 78 year old male with renal failure, toxic metabolic encephalopathy, septic shock with LE cellulitis. Required CRRT and pressor support. History of HTN, atrial fibrillation, ventricular tachycardia, AICD placement, dementia.    PT Comments  Pt seen for PT tx with pt agreeable. Pt AxOx self, place, year but anxious re: falling with PT providing reassurance throughout session. Pt requires max assist for supine>sit with HOB elevated, bed rails. Pt requires max assist for sit<>stand from EOB with cuing but poor return demo re: hand placement & instead of pushing to stand pt has BUE on RW. Pt transfers bed>recliner on L with RW & mod<>max assist with 2nd person present to reassure pt. Pt with decreased weight shifting, stepping & clearing feet during step pivot. Continue to recommend PT services to address balance, strengthening, & progress gait with LRAD.    If plan is discharge home, recommend the following: Two people to help with walking and/or transfers;Two people to help with bathing/dressing/bathroom;Assist for transportation;Help with stairs or ramp for entrance;Supervision due to cognitive status;Assistance with cooking/housework;Direct supervision/assist for medications management;Direct supervision/assist for financial management   Can travel by private vehicle     No  Equipment Recommendations  Other (comment) (defer to next venue)    Recommendations for Other Services       Precautions / Restrictions Precautions Precautions: Fall Precaution/Restrictions Comments: right femoral temporary triple lumen Restrictions Weight Bearing Restrictions Per Provider Order: No     Mobility  Bed Mobility Overal bed mobility: Needs Assistance Bed Mobility: Supine to Sit     Supine to sit: Max assist, HOB elevated, Used rails      General bed mobility comments: Pt able to move BLE to EOB, assist with uprighting trunk but requires step by step cuing & max assist.    Transfers Overall transfer level: Needs assistance Equipment used: Rolling walker (2 wheels) Transfers: Sit to/from Stand, Bed to chair/wheelchair/BSC Sit to Stand: Max assist   Step pivot transfers: Mod assist, Max assist (nurse present nearby to reassure pt; pt transfers bed>recliner on L with RW & significantly extra time, decreased ability to weight shift L<>R, decreased foot clearance, decreased stepping with LLE compared to RLE)       General transfer comment: sit<>stand from EOB x 2 with RW & cuing for hand placement to push to standing but pt instead with BUE on RW    Ambulation/Gait                   Stairs             Wheelchair Mobility     Tilt Bed    Modified Rankin (Stroke Patients Only)       Balance Overall balance assessment: Needs assistance Sitting-balance support: Feet unsupported, Single extremity supported Sitting balance-Leahy Scale: Fair Sitting balance - Comments: close supervision static sitting EOB   Standing balance support: During functional activity, Bilateral upper extremity supported, Reliant on assistive device for balance Standing balance-Leahy Scale: Poor                              Communication Communication Communication: No apparent difficulties  Cognition Arousal: Alert Behavior During Therapy: Anxious   PT - Cognitive impairments: History of cognitive impairments  PT - Cognition Comments: Pt with hx of dementia, oriented to time, place, self but not situation, fearful of falling, speaks about coming from another hospital. Following commands: Impaired Following commands impaired: Follows one step commands with increased time    Cueing Cueing Techniques: Verbal cues, Tactile cues, Gestural cues, Visual cues  Exercises       General Comments General comments (skin integrity, edema, etc.): R temp femoral cath intact pre & post session      Pertinent Vitals/Pain Pain Assessment Pain Assessment: Faces Faces Pain Scale: No hurt    Home Living                          Prior Function            PT Goals (current goals can now be found in the care plan section) Acute Rehab PT Goals Patient Stated Goal: to get his memory back and improve strength PT Goal Formulation: With patient Time For Goal Achievement: 07/30/23 Potential to Achieve Goals: Fair Progress towards PT goals: Progressing toward goals    Frequency    Min 2X/week      PT Plan      Co-evaluation              AM-PAC PT "6 Clicks" Mobility   Outcome Measure  Help needed turning from your back to your side while in a flat bed without using bedrails?: A Lot Help needed moving from lying on your back to sitting on the side of a flat bed without using bedrails?: Total Help needed moving to and from a bed to a chair (including a wheelchair)?: Total Help needed standing up from a chair using your arms (e.g., wheelchair or bedside chair)?: Total Help needed to walk in hospital room?: Total Help needed climbing 3-5 steps with a railing? : Total 6 Click Score: 7    End of Session Equipment Utilized During Treatment: Gait belt Activity Tolerance: Patient tolerated treatment well Patient left: in chair;with call bell/phone within reach;with nursing/sitter in room Nurse Communication: Mobility status PT Visit Diagnosis: Unsteadiness on feet (R26.81);Muscle weakness (generalized) (M62.81);Other abnormalities of gait and mobility (R26.89);Difficulty in walking, not elsewhere classified (R26.2)     Time: 7169-6789 PT Time Calculation (min) (ACUTE ONLY): 15 min  Charges:    $Therapeutic Activity: 8-22 mins PT General Charges $$ ACUTE PT VISIT: 1 Visit                     Ethan Vega, PT, DPT 07/17/23, 4:45  PM   Ethan Vega 07/17/2023, 4:44 PM

## 2023-07-17 NOTE — TOC Initial Note (Signed)
 Transition of Care (TOC) - Initial/Assessment Note    Patient Details  Name: Ethan Vega. MRN: [id number] Date of Birth: 18-Dec-1945  Transition of Care [facility]) CM/SW Contact:    Crayton Docker, RN Phone Number: 07/17/2023, 5:06 PM  Clinical Narrative:                  CM to patient's room regarding TOC screening assessment. CM introduced case management role to patient and patient's daughter, Waver, who arrived for visit. Patient and patient's daughter verbalized agreement of TOC screening interview. CM and patient's daughter discussed therapy recommendations for SNF. Per patient's daughter, requests SNFs in [place]. CM will initiate SNF workup.  Expected Discharge Plan: Home/Self Care Barriers to Discharge: Continued Medical Work up   Patient Goals and CMS Choice    SNF     Expected Discharge Plan and Services   Discharge Planning Services: CM Consult   Living arrangements for the past 2 months: Single Family Home (4 steps , handrails x 1)                   Prior Living Arrangements/Services Living arrangements for the past 2 months: Single Family Home (4 steps , handrails x 1) Lives with:: Self, Pets Patient language and need for interpreter reviewed:: No        Need for Family Participation in Patient Care: No (Comment) Care giver support system in place?: Yes (comment) Current home services: DME (walker--no wheels) Criminal Activity/Legal Involvement Pertinent to Current Situation/Hospitalization: No - Comment as needed  Activities of Daily Living   ADL Screening (condition at time of admission) Independently performs ADLs?: No Does the patient have a NEW difficulty with bathing/dressing/toileting/self-feeding that is expected to last >3 days?: No Does the patient have a NEW difficulty with getting in/out of bed, walking, or climbing stairs that is expected to last >3 days?: Yes (Initiates electronic notice to provider for possible PT consult) Does the  patient have a NEW difficulty with communication that is expected to last >3 days?: No Is the patient deaf or have difficulty hearing?: Yes Does the patient have difficulty seeing, even when wearing glasses/contacts?: No Does the patient have difficulty concentrating, remembering, or making decisions?: Yes  Permission Sought/Granted      Share Information with NAME: Math Finck     Permission granted to share info w Relationship: Daughter  Permission granted to share info w Contact Information: yes  Emotional Assessment Appearance:: Appears stated age Attitude/Demeanor/Rapport: Engaged Affect (typically observed): Calm Orientation: : Oriented to Self, Oriented to Situation Alcohol / Substance Use: Not Applicable Psych Involvement: No (comment)  Admission diagnosis:  Cellulitis of left foot [L03.116] Septic shock (HCC) [A41.9, R65.21] Sepsis with acute renal failure and septic shock, due to unspecified organism, unspecified acute renal failure type (HCC) [A41.9, R65.21, N17.9] Acute renal failure superimposed on chronic kidney disease, unspecified acute renal failure type, unspecified CKD stage (HCC) [N17.9, N18.9] Patient Active Problem List   Diagnosis Date Noted   Class 1 obesity 07/17/2023   Failure to thrive in adult 07/17/2023   Anorexia 07/17/2023   Adrenal insufficiency (HCC) 07/17/2023   Acute renal failure superimposed on chronic kidney disease (HCC) 07/14/2023   Cellulitis of left foot 07/14/2023   Pressure injury of skin 07/14/2023   Toxic metabolic encephalopathy 07/14/2023   Septic shock (HCC) 07/10/2023   Mixed hyperlipidemia 10/09/2022   Acquired hypothyroidism 10/09/2022   Ventricular tachycardia (HCC) 02/16/2017   Paroxysmal atrial fibrillation (HCC) 03/28/2015  Leg swelling 07/27/2014   NICM (nonischemic cardiomyopathy) (HCC) 04/03/2014   HTN (hypertension) 04/03/2014   S/P implantation of automatic cardioverter/defibrillator (AICD) 04/03/2014    Prolonged Q-T interval on ECG 04/03/2014   Encounter for monitoring amiodarone  therapy 04/03/2014   Sinus node dysfunction/bradycardia 04/01/2014   Elevated transaminase level 04/01/2014   Cardiac arrest (HCC) 03/30/2014   PCP:  Shari Daughters, MD Pharmacy:   [facility] [phone] [facility] - [address] [place] [address] Phone: [phone] Fax: [phone]     Social Drivers of Health (SDOH) Social History: SDOH Screenings   Food Insecurity: Patient Unable To Answer (07/10/2023)  Housing: Patient Unable To Answer (07/10/2023)  Transportation Needs: Patient Unable To Answer (07/10/2023)  Utilities: Patient Unable To Answer (07/10/2023)  Depression (PHQ2-9): Low Risk  (01/15/2023)  Social Connections: Patient Unable To Answer (07/10/2023)  Tobacco Use: Low Risk  (07/10/2023)   SDOH Interventions:     Readmission Risk Interventions     No data to display

## 2023-07-17 NOTE — Consult Note (Signed)
 PODIATRY CONSULTATION  NAME Ethan Vega. MRN [id number] DOB November 30, 1945 DOA 07/10/2023   Reason for consult:  Chief Complaint  Patient presents with   Hypotension    Attending/Consulting physician: Eveleen Hinds MD  History of present illness:" Ethan Vega is a 78 year old male with a history of essential hypertension, nonischemic cardiomyopathy history of paroxysmal A-fib and ventricular tachycardia, dyslipidemia who was seen earlier today with electrophysiology service to have ICD generator change however he was noted to have open sores on his left lower extremity and was mentating with confusion and encephalopathy with decision to abort procedure and reschedule. He was subsequently sent to the ER. His workup revealed severe AKI and leukocytosis and vitals with shock physiology. Patient received IV fluid resuscitation but despite 3 L of intravenous fluids continues to require Levophed  support. He reports diarrhea over the past week. Patient currently lives alone in his home and was previously able to perform all activities of daily living. He is unaware of the open score and cellulitic lesion of his left foot. "  Patient unsure of how he developed the ulceration in his left foot.  Unable to provide much history.    Past Medical History:  Diagnosis Date   Actinic keratosis    AICD (automatic cardioverter/defibrillator) present 03/31/2014   a. 03/2014 s/p SJM 2411-36C dual chamber AICD (serial Number [id number])- followed by Dr. Rodolfo Clan   Arthritis    a. L knee.   Asthma    Basal cell carcinoma 08/29/2008   Vertex scalp. Keratotic pattern, ulcerated.   Basal cell carcinoma 01/15/2018   Left distal medial thigh near knee. Fibroepithelioma of pinkus type   Basal cell carcinoma 01/15/2018   Right distal lat. nose ant. inferior edge. Nodular pattern.   Cardiac arrest (HCC)    a. 03/30/2014 VF arrest in setting of NICM >> CPR/defib in community >> s/p dual chamber AICD   Coronary artery  disease, non-occlusive    a. cath 03/2014 at [facility]: no sig CAD (OM1 30%, CFX 30%), EF 35%   Dementia (HCC)    Dysplastic nevus 01/31/2020   L mid back paraspinal - severe, excision 03/21/2020   HLD (hyperlipidemia)    HTN (hypertension)    Kidney stones    Melanoma (HCC)    Melanoma resected from scalp   NICM (nonischemic cardiomyopathy) (HCC)    a. 03/2014 Echo:  Inferolateral and lateral HK, EF 50-55%, grade 1 diastolic dysfunction, mild MR, mild LAE, mild RAE, trivial TR, no effusion; 09/2018 Echo: EF 35-40%, impaired relaxation, glob HK, sev apical HK. Nl RV fxn. RVSP . Mildly dil LA.   Paroxysmal atrial fibrillation (HCC) 03/28/2015   Retinal artery occlusion        Latest Ref Rng & Units 07/17/2023    4:21 AM 07/16/2023    3:26 AM 07/15/2023    5:09 AM  CBC  WBC 4.0 - 10.5 K/uL 10.5  11.0  13.6   Hemoglobin 13.0 - 17.0 g/dL 13.0  86.5  78.4   Hematocrit 39.0 - 52.0 % 37.0  36.1  37.0   Platelets 150 - 400 K/uL 153  144  175        Latest Ref Rng & Units 07/17/2023    4:21 AM 07/16/2023    3:26 AM 07/15/2023    4:40 PM  BMP  Glucose 70 - 99 mg/dL 97  96  96   BUN 8 - 23 mg/dL 30  20  13    Creatinine 0.61 - 1.24 mg/dL 6.96  1.48  1.02   Sodium 135 - 145 mmol/L 136  137  138   Potassium 3.5 - 5.1 mmol/L 4.6  4.1  4.2   Chloride 98 - 111 mmol/L 106  104  102   CO2 22 - 32 mmol/L 23  23  24    Calcium  8.9 - 10.3 mg/dL 8.4  8.2  7.9       Physical Exam: Lower Extremity Exam Vasc: R - PT none palpable, DP 2/4 palpable. Cap refill < 3 sec to digits  L - PT non palpable, DP non palpable. Cap refill <3 sec to digits  Derm: R - Normal temp/texture/turgor with no open lesion or clinical signs of infection    L -superficial ulceration dorsal aspect of the left foot with no significant erythema surrounding improved from prior pictures.  Mild fibrotic necrotic tissues in the wound bed approximately 50% is healthy granular tissue.  No malodor no streaking erythema no deep probing  aspect of the ulceration.  Also has scattered superficial abrasion on the left lower leg.    MSK:  R -  No gross deformities. Compartments soft, non-tender, compressible  L -  No gross deformities. Compartments soft, non-tender, compressible  Neuro: R - Gross sensation intact. Gross motor function diminished   L - Gross sensation intact. Gross motor function diminished    ASSESSMENT/PLAN OF CARE 78 y.o. male with PMHx significant for  essential hypertension, nonischemic cardiomyopathy history of paroxysmal A-fib and ventricular tachycardia, dyslipidemia  with superficial ulceration dorsal aspect left foot without evidence of deep infection and resolving cellulitis  WBC 10.5 CT L foot 4/24: 1. Diffuse subcutaneous edema extending along the mid to distal left lower extremity through the forefoot, concerning for cellulitis. Cutaneous irregularity along the dorsal forefoot likely represents a wound. No loculated fluid collection. 2. No acute osseous abnormality. No evidence of acute osteolysis or erosive changes.    -Wound appears to be improving with Medihoney.  Will switch to daily Santyl  applied to the left dorsal foot wound for enzymatic debridement.  Cover with border foam dressings.  Change daily. -Given diminished pedal pulses recommend ABI screening.  -No surgical intervention is indicated continue with local wound care.  No evidence of deep infection abscess or osteomyelitis. - Continue IV abx broad spectrum pending further culture data - Anticoagulation: Okay to continue per primary - Wound care: As above -Prevalon boots while in bed to prevent heel decubitus ulcerations - WB status: Weightbearing as tolerated bilateral lower extremity, postop shoe ordered for left foot when ambulatory - Will sign off please message with questions or concerns.   Thank you for the consult.  Please contact me directly with any questions or concerns.           Maridee Shoemaker, DPM [facility]    [address] [place], [place] [address]                Office [phone]  Fax [phone]

## 2023-07-17 NOTE — Progress Notes (Signed)
 PHARMACY - ANTICOAGULATION CONSULT NOTE  Pharmacy Consult for Apixaban  Indication: atrial fibrillation  Patient Measurements: Height: 5\' 3"  (160 cm) Weight: 87.2 kg (192 lb 3.9 oz) IBW/kg (Calculated) : 56.9 HEPARIN  DW (KG): 77  Labs: Recent Labs    07/15/23 0509 07/15/23 1640 07/16/23 0326 07/17/23 0421  HGB 12.6*  --  12.1* 12.5*  HCT 37.0*  --  36.1* 37.0*  PLT 175  --  144* 153  HEPARINUNFRC 0.44  --  0.44 0.46  CREATININE 1.10 1.02 1.48* 1.96*    Estimated Creatinine Clearance: 29.3 mL/min (A) (by C-G formula based on SCr of 1.96 mg/dL (H)).   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 03/2014 s/p SJM 2411-36C dual chamber AICD (serial Number [id number])- followed by Dr. Rodolfo Clan   Arthritis    a. L knee.   Asthma    Basal cell carcinoma 08/29/2008   Vertex scalp. Keratotic pattern, ulcerated.   Basal cell carcinoma 01/15/2018   Left distal medial thigh near knee. Fibroepithelioma of pinkus type   Basal cell carcinoma 01/15/2018   Right distal lat. nose ant. inferior edge. Nodular pattern.   Cardiac arrest (HCC)    a. 03/30/2014 VF arrest in setting of NICM >> CPR/defib in community >> s/p dual chamber AICD   Coronary artery disease, non-occlusive    a. cath 03/2014 at [facility]: no sig CAD (OM1 30%, CFX 30%), EF 35%   Dementia (HCC)    Dysplastic nevus 01/31/2020   L mid back paraspinal - severe, excision 03/21/2020   HLD (hyperlipidemia)    HTN (hypertension)    Kidney stones    Melanoma (HCC)    Melanoma resected from scalp   NICM (nonischemic cardiomyopathy) (HCC)    a. 03/2014 Echo:  Inferolateral and lateral HK, EF 50-55%, grade 1 diastolic dysfunction, mild MR, mild LAE, mild RAE, trivial TR, no effusion; 09/2018 Echo: EF 35-40%, impaired relaxation, glob HK, sev apical HK. Nl RV fxn. RVSP . Mildly dil LA.   Paroxysmal atrial fibrillation (HCC) 03/28/2015    Retinal artery occlusion    Assessment:  78 year old male w/ PMH of HTN, nonischemic cardiomyopathy, atrial fibrillation,  ventricular tachycardia, HLD, dementia admitted with circulatory shock with AKI. Pharmacy consulted for apixaban  dosing for Afib.  Patient takes apixaban  5 mg BID prior to admission. He has been in renal failure this admission requiring CRRT and now nephrology assessing for HD. UOP is increasing.   Plan:  --Apixaban  2.5 mg BID for now --If patient experiences renal recovery with Scr returning to < 1.5 and off RRT will resume home dosing of 5 mg BID  Page Boast 07/17/2023 1:08 PM

## 2023-07-17 NOTE — Progress Notes (Signed)
 Progress Note   Patient: Ethan Vega. KYH:[id number] DOB: Jan 31, 1946 DOA: 07/10/2023     7 DOS: the patient was seen and examined on 07/17/2023   Brief hospital course: Billal Minge is a 78 year old male with a history of essential hypertension, nonischemic cardiomyopathy history of paroxysmal A-fib and ventricular tachycardia, dyslipidemia who was seen earlier today with electrophysiology service to have ICD generator change however he was noted to have open sores on his left lower extremity and was mentating with confusion and encephalopathy with decision to abort procedure and reschedule. He was subsequently sent to the ER. His workup revealed severe AKI and leukocytosis and vitals with shock physiology. Patient received IV fluid resuscitation but despite 3 L of intravenous fluids continues to require Levophed  support. He reports diarrhea over the past week. Patient currently lives alone in his home and was previously able to perform all activities of daily living. He is unaware of the open score and cellulitic lesion of his left foot.    Principal Problem:   Septic shock (HCC) Active Problems:   Acquired hypothyroidism   Acute renal failure superimposed on chronic kidney disease (HCC)   Cellulitis of left foot   Pressure injury of skin   Toxic metabolic encephalopathy   Class 1 obesity   Failure to thrive in adult   Anorexia   Adrenal insufficiency (HCC)   Assessment and Plan: Septic shock secondary to left foot infection. Left foot ulcer with cellulitis. Elevated troponin secondary to septic shock. Adrenal insufficiency. Patient had received IV fluid bolus, he also received Levophed .  Currently off Levophed , blood pressure is borderline low, added midodrine .  Also check cortisol level. Blood Culture has no growth. Patient was treated with vancomycin , completed dose.  Then on Zosyn . CT scan of the foot did not show any bony involvement. He was seen by podiatry, no need for  debridement.  I will continue oral antibiotics with Augmentin  for additional 3 days to complete 10-day course.  Condition seems to be improved. Patient low blood pressure was also associated with adrenal insufficiency, cortisol level in the morning was only 3.5, started prednisone  5 mg daily.  Paroxysmal atrial fibrillation. Nonsustained ventricular tachycardia. Nonischemic cardiomyopathy with chronic systolic congestive heart failure with EF 30-35%. Appears to be euvolemic.  Discontinue heparin  drip, placed on Eliquis .  Failure to thrive. Anorexia. Chronic diarrhea. Patient has very poor appetite, currently eating 25% of meals.  No longer has diarrhea on Creon . Will start Protonix , megestrol . If patient does not eat better, may consider tube feeding.  Acute kidney injury chronic kidney disease stage IIIa. Hyponatremia. Followed by nephrology, briefly needing dialysis.  Renal function had improved.   Patient had very poor p.o. intake, renal function is getting worse again, most likely distal secondary to dehydration.  Will give gentle rehydration today, also started on yesterday improved patient appetite.   Acute metabolic encephalopathy. Chronic dementia. Condition appears to be stable.   Debility. PT/OT recommended nursing home placement.   Class 1 obesity. BMI 32.77 Patient currently has a very poor appetite.      Subjective:  Patient very weak, poor appetite.  Physical Exam: Vitals:   07/17/23 0900 07/17/23 1000 07/17/23 1100 07/17/23 1200  BP: (!) 96/58 98/66 94/73  94/68  Pulse: 88 81 91 95  Resp: 13 18 (!) 21 20  Temp: 98.2 F (36.8 C)     TempSrc:      SpO2: 90% 96% 97% 98%  Weight:      Height:  General exam: Appears calm and comfortable, obese. Respiratory system: Clear to auscultation. Respiratory effort normal. Cardiovascular system: S1 & S2 heard, RRR. No JVD, murmurs, rubs, gallops or clicks. No pedal edema. Gastrointestinal system: Abdomen is  nondistended, soft and nontender. No organomegaly or masses felt. Normal bowel sounds heard. Central nervous system: Alert and oriented x2. No focal neurological deficits. Extremities: Symmetric 5 x 5 power. Skin: No rashes, lesions or ulcers Psychiatry: Flat affect.   Data Reviewed:  Lab results reviewed.  Family Communication: Daughter updated over the phone.  Disposition: Status is: Inpatient Remains inpatient appropriate because: Severity of disease, IV treatment.     Time spent: 50 minutes  Author: Donaciano Frizzle, MD 07/17/2023 1:03 PM  For on call review [email or web address].

## 2023-07-17 NOTE — Progress Notes (Signed)
 [facility]  ROUNDING NOTE   Subjective:   Patient sitting up in bed Currently awaiting breakfast Alert and oriented to self Denies shortness of breath  04/30 0701 - 05/01 0700 In: 358.2 [P.O.:100; I.V.:208.3; IV Piggyback:49.9] Out: 675 [Urine:675] Lab Results  Component Value Date   CREATININE 1.96 (H) 07/17/2023   CREATININE 1.48 (H) 07/16/2023   CREATININE 1.02 07/15/2023     Objective:  Vital signs in last 24 hours:  Temp:  [97.6 F (36.4 C)-98.2 F (36.8 C)] 98.2 F (36.8 C) (05/01 0900) Pulse Rate:  [53-95] 95 (05/01 1200) Resp:  [12-22] 20 (05/01 1200) BP: (82-105)/(51-73) 94/68 (05/01 1200) SpO2:  [90 %-100 %] 98 % (05/01 1200) Weight:  [87.2 kg] 87.2 kg (05/01 0500)  Weight change: 3.3 kg Filed Weights   07/15/23 0320 07/16/23 0345 07/17/23 0500  Weight: 82 kg 83.9 kg 87.2 kg    Intake/Output: I/O last 3 completed shifts: In: 483.7 [P.O.:100; I.V.:333.8; IV Piggyback:49.9] Out: 905 [Urine:905]   Intake/Output this shift:  Total I/O In: 30 [I.V.:30] Out: -   Physical Exam: General: NAD  Head: Normocephalic, atraumatic. Moist oral mucosal membranes  Eyes: Anicteric  Lungs:  Clear to auscultation, normal effort  Heart: Regular rate and rhythm  Abdomen:  Soft, nontender  Extremities: trace peripheral edema.  Neurologic: Awake, alert, conversant  Skin: No lesions  Access: Rt femoral HD temp cath    Basic Metabolic Panel: Recent Labs  Lab 07/13/23 0317 07/13/23 1546 07/14/23 0409 07/14/23 1527 07/15/23 0509 07/15/23 1640 07/16/23 0326 07/17/23 0421  NA 137   < > 137 136 133* 138 137 136  K 4.2   < > 4.1 3.6 4.1 4.2 4.1 4.6  CL 103   < > 103 106 102 102 104 106  CO2 22   < > 23 24 23 24 23 23   GLUCOSE 132*   < > 111* 127* 92 96 96 97  BUN 13   < > 12 13 11 13 20  30*  CREATININE 0.99   < > 1.07 1.15 1.10 1.02 1.48* 1.96*  CALCIUM  8.1*   < > 8.0* 8.1* 7.3* 7.9* 8.2* 8.4*  MG 2.7*  --  2.5*  --  2.6*  --  2.6* 2.4  PHOS  2.2*   < > 1.9* 3.0 1.6* 3.9 4.4  --    < > = values in this interval not displayed.    Liver Function Tests: Recent Labs  Lab 07/14/23 1527 07/15/23 0509 07/15/23 1640 07/16/23 0326 07/17/23 0421  AST  --   --   --   --  31  ALT  --   --   --   --  39  ALKPHOS  --   --   --   --  41  BILITOT  --   --   --   --  1.5*  PROT  --   --   --   --  4.8*  ALBUMIN 2.3* 2.5* 2.4* 2.3* 2.3*   Recent Labs  Lab 07/10/23 1540  LIPASE 60*  AMYLASE 77   No results for input(s): "AMMONIA" in the last 168 hours.  CBC: Recent Labs  Lab 07/13/23 0317 07/14/23 0409 07/15/23 0509 07/16/23 0326 07/17/23 0421  WBC 17.6* 12.6* 13.6* 11.0* 10.5  HGB 13.9 12.3* 12.6* 12.1* 12.5*  HCT 39.4 35.2* 37.0* 36.1* 37.0*  MCV 92.1 91.9 94.4 96.8 96.6  PLT 208 188 175 144* 153    Cardiac Enzymes: No results for  input(s): "CKTOTAL", "CKMB", "CKMBINDEX", "TROPONINI" in the last 168 hours.  BNP: Invalid input(s): "POCBNP"  CBG: Recent Labs  Lab 07/11/23 2239 07/12/23 2007 07/13/23 0000 07/13/23 0741 07/15/23 1707  GLUCAP 145* 129* 136* 111* 98    Microbiology: Results for orders placed or performed during the hospital encounter of 07/10/23  Resp panel by RT-PCR (RSV, Flu A&B, Covid) Anterior Nasal Swab     Status: None   Collection Time: 07/10/23  8:50 AM   Specimen: Anterior Nasal Swab  Result Value Ref Range Status   SARS Coronavirus 2 by RT PCR NEGATIVE NEGATIVE Final    Comment: (NOTE) SARS-CoV-2 target nucleic acids are NOT DETECTED.  The SARS-CoV-2 RNA is generally detectable in upper respiratory specimens during the acute phase of infection. The lowest concentration of SARS-CoV-2 viral copies this assay can detect is 138 copies/mL. A negative result does not preclude SARS-Cov-2 infection and should not be used as the sole basis for treatment or other patient management decisions. A negative result may occur with  improper specimen collection/handling, submission of specimen  other than nasopharyngeal swab, presence of viral mutation(s) within the areas targeted by this assay, and inadequate number of viral copies(<138 copies/mL). A negative result must be combined with clinical observations, patient history, and epidemiological information. The expected result is Negative.  Fact Sheet for Patients:  BloggerCourse.com  Fact Sheet for Healthcare Providers:  SeriousBroker.it  This test is no t yet approved or cleared by the [place]  FDA and  has been authorized for detection and/or diagnosis of SARS-CoV-2 by FDA under an Emergency Use Authorization (EUA). This EUA will remain  in effect (meaning this test can be used) for the duration of the COVID-19 declaration under Section 564(b)(1) of the Act, 21 U.S.C.section 360bbb-3(b)(1), unless the authorization is terminated  or revoked sooner.       Influenza A by PCR NEGATIVE NEGATIVE Final   Influenza B by PCR NEGATIVE NEGATIVE Final    Comment: (NOTE) The Xpert Xpress SARS-CoV-2/FLU/RSV plus assay is intended as an aid in the diagnosis of influenza from Nasopharyngeal swab specimens and should not be used as a sole basis for treatment. Nasal washings and aspirates are unacceptable for Xpert Xpress SARS-CoV-2/FLU/RSV testing.  Fact Sheet for Patients: BloggerCourse.com  Fact Sheet for Healthcare Providers: SeriousBroker.it  This test is not yet approved or cleared by the [place]  FDA and has been authorized for detection and/or diagnosis of SARS-CoV-2 by FDA under an Emergency Use Authorization (EUA). This EUA will remain in effect (meaning this test can be used) for the duration of the COVID-19 declaration under Section 564(b)(1) of the Act, 21 U.S.C. section 360bbb-3(b)(1), unless the authorization is terminated or revoked.     Resp Syncytial Virus by PCR NEGATIVE NEGATIVE Final     Comment: (NOTE) Fact Sheet for Patients: BloggerCourse.com  Fact Sheet for Healthcare Providers: SeriousBroker.it  This test is not yet approved or cleared by the [place]  FDA and has been authorized for detection and/or diagnosis of SARS-CoV-2 by FDA under an Emergency Use Authorization (EUA). This EUA will remain in effect (meaning this test can be used) for the duration of the COVID-19 declaration under Section 564(b)(1) of the Act, 21 U.S.C. section 360bbb-3(b)(1), unless the authorization is terminated or revoked.  Performed at [facility], [address] [place], [place] [address]   Blood Culture (routine x 2)     Status: None   Collection Time: 07/10/23  8:50 AM   Specimen: BLOOD  Result  Value Ref Range Status   Specimen Description BLOOD RIGHT AC  Final   Special Requests   Final    BOTTLES DRAWN AEROBIC AND ANAEROBIC Blood Culture results may not be optimal due to an inadequate volume of blood received in culture bottles   Culture   Final    NO GROWTH 5 DAYS Performed at [facility], [address] [place], [place] [address]    Report Status 07/15/2023 FINAL  Final  Blood Culture (routine x 2)     Status: None   Collection Time: 07/10/23  8:05 PM   Specimen: BLOOD RIGHT HAND  Result Value Ref Range Status   Specimen Description BLOOD RIGHT HAND  Final   Special Requests   Final    BOTTLES DRAWN AEROBIC AND ANAEROBIC Blood Culture results may not be optimal due to an inadequate volume of blood received in culture bottles   Culture   Final    NO GROWTH 5 DAYS Performed at [facility], [address], [place], [place] [address]    Report Status 07/15/2023 FINAL  Final  MRSA Next Gen by PCR, Nasal     Status: None   Collection Time: 07/10/23  8:35 PM   Specimen: Nasal Mucosa; Nasal Swab  Result Value Ref Range Status   MRSA by PCR Next Gen NOT DETECTED NOT DETECTED Final     Comment: (NOTE) The GeneXpert MRSA Assay (FDA approved for NASAL specimens only), is one component of a comprehensive MRSA colonization surveillance program. It is not intended to diagnose MRSA infection nor to guide or monitor treatment for MRSA infections. Test performance is not FDA approved in patients less than 83 years old. Performed at [facility], [address] [place], [place] [address]     Coagulation Studies: No results for input(s): "LABPROT", "INR" in the last 72 hours.   Urinalysis: No results for input(s): "COLORURINE", "LABSPEC", "PHURINE", "GLUCOSEU", "HGBUR", "BILIRUBINUR", "KETONESUR", "PROTEINUR", "UROBILINOGEN", "NITRITE", "LEUKOCYTESUR" in the last 72 hours.  Invalid input(s): "APPERANCEUR"     Imaging: No results found.    Medications:    sodium chloride       amoxicillin -clavulanate  1 tablet Oral Q12H   apixaban   2.5 mg Oral BID   vitamin C   500 mg Oral BID   Chlorhexidine  Gluconate Cloth  6 each Topical Q0600   [START ON 07/18/2023] collagenase    Topical Daily   feeding supplement  237 mL Oral TID BM   levothyroxine   75 mcg Oral Q0600   lidocaine   1 patch Transdermal Q24H   lipase/protease/amylase  24,000 Units Oral TID AC   liver oil-zinc  oxide   Topical BID   megestrol   400 mg Oral Daily   midodrine   10 mg Oral TID WC   multivitamin  1 tablet Oral QHS   mouth rinse  15 mL Mouth Rinse 4 times per day   pantoprazole   40 mg Oral Daily   pneumococcal 20-valent conjugate vaccine  0.5 mL Intramuscular Tomorrow-1000   predniSONE   5 mg Oral Q breakfast   QUEtiapine   25 mg Oral QHS   sodium chloride  flush  10-40 mL Intracatheter Q12H   docusate sodium , mouth rinse, sodium chloride  flush  Assessment/ Plan:  Mr. Ethan Vega. is a 78 y.o.  male  with a PMHx of hypertension, nonischemic cardiomyopathy, paroxysmal atrial fibrillation, history of ventricular tachycardia, hyperlipidemia, history of AICD placement, history of skin  cancer, dementia, who was admitted to [facility] on 07/10/2023 for Cellulitis of left  foot [L03.116] Septic shock (HCC) [A41.9, R65.21] Sepsis with acute renal failure and septic shock, due to unspecified organism, unspecified acute renal failure type (HCC) [A41.9, R65.21, N17.9] Acute renal failure superimposed on chronic kidney disease, unspecified acute renal failure type, unspecified CKD stage (HCC) [N17.9, N18.9]   Acute kidney injury/chronic kidney disease stage IIIb baseline EGFR 39 on 06/26/23/hyperkalemia.  Patient now with acute kidney injury secondary to ATN most likely.  Patient has had rather poor p.o. intake at home over the past several days prior to admission according to the daughter.  CT scan abdomen pelvis negative for obstruction.  Given severity of acute kidney injury recommend initiation of continuous renal placement therapy.  Appreciate critical care placing right femoral temporary dialysis catheter.  CRRT stopped on 4/29.  Patient is starting to make more urine, which is promising.  Was initially planning on dialysis treatment today however will hold and monitor for continued renal recovery.  Will continue to assess daily for need of dialysis.  04/30 0701 - 05/01 0700 In: 358.2 [P.O.:100; I.V.:208.3; IV Piggyback:49.9] Out: 675 [Urine:675] Lab Results  Component Value Date   CREATININE 1.96 (H) 07/17/2023   CREATININE 1.48 (H) 07/16/2023   CREATININE 1.02 07/15/2023      2.  Acute metabolic acidosis.  Resolved with CRRT and maintained presently.  3.  Hypotension.  Weaned off pressors. BP remains soft. Prescribed Midodrine  5mg  three times daily. Will continue to monitor.      LOS: 7 Faizon Capozzi 5/1/20251:09 PM

## 2023-07-17 NOTE — Progress Notes (Signed)
 0800 Confused 78 year old with bruising noted all over his body. He has a wound on his left lower leg that was seen by Podiatry this morning. Does not remember coming to the hospital on 4/24.

## 2023-07-17 NOTE — Plan of Care (Signed)
   Problem: Education: Goal: Knowledge of General Education information will improve Description Including pain rating scale, medication(s)/side effects and non-pharmacologic comfort measures Outcome: Progressing   Problem: Health Behavior/Discharge Planning: Goal: Ability to manage health-related needs will improve Outcome: Progressing

## 2023-07-18 DIAGNOSIS — E871 Hypo-osmolality and hyponatremia: Secondary | ICD-10-CM | POA: Insufficient documentation

## 2023-07-18 DIAGNOSIS — L03116 Cellulitis of left lower limb: Secondary | ICD-10-CM | POA: Diagnosis not present

## 2023-07-18 DIAGNOSIS — A419 Sepsis, unspecified organism: Secondary | ICD-10-CM | POA: Diagnosis not present

## 2023-07-18 DIAGNOSIS — E2740 Unspecified adrenocortical insufficiency: Secondary | ICD-10-CM | POA: Diagnosis not present

## 2023-07-18 DIAGNOSIS — R6521 Severe sepsis with septic shock: Secondary | ICD-10-CM | POA: Diagnosis not present

## 2023-07-18 DIAGNOSIS — N171 Acute kidney failure with acute cortical necrosis: Secondary | ICD-10-CM | POA: Diagnosis not present

## 2023-07-18 DIAGNOSIS — N179 Acute kidney failure, unspecified: Secondary | ICD-10-CM | POA: Diagnosis not present

## 2023-07-18 DIAGNOSIS — R627 Adult failure to thrive: Secondary | ICD-10-CM | POA: Diagnosis not present

## 2023-07-18 LAB — BASIC METABOLIC PANEL WITH GFR
Anion gap: 7 (ref 5–15)
BUN: 38 mg/dL — ABNORMAL HIGH (ref 8–23)
CO2: 21 mmol/L — ABNORMAL LOW (ref 22–32)
Calcium: 8.4 mg/dL — ABNORMAL LOW (ref 8.9–10.3)
Chloride: 104 mmol/L (ref 98–111)
Creatinine, Ser: 1.97 mg/dL — ABNORMAL HIGH (ref 0.61–1.24)
GFR, Estimated: 34 mL/min — ABNORMAL LOW (ref >60)
Glucose, Bld: 111 mg/dL — ABNORMAL HIGH (ref 70–99)
Potassium: 4.4 mmol/L (ref 3.5–5.1)
Sodium: 132 mmol/L — ABNORMAL LOW (ref 135–145)

## 2023-07-18 MED ORDER — MIDODRINE HCL 5 MG PO TABS
5.0000 mg | ORAL_TABLET | Freq: Three times a day (TID) | ORAL | Status: DC
  Administered 2023-07-18 – 2023-07-19 (×3): 5 mg via ORAL
  Filled 2023-07-18 (×3): qty 1

## 2023-07-18 MED ORDER — SODIUM CHLORIDE 0.9 % IV SOLN: INTRAVENOUS | Status: AC

## 2023-07-18 NOTE — NC FL2 (Signed)
 [facility]  MEDICAID FL2 LEVEL OF CARE FORM     IDENTIFICATION  Patient Name: Ethan Vega. Birthdate: December 22, 1945 Sex: male Admission Date (Current Location): 07/10/2023  [facility] [place] and [place] Number:  Theodis Fiscal   Facility and Address:  [facility], [address], [place], [place] [address]      Provider Number: [id number]  Attending Physician Name and Address:  Donaciano Frizzle, MD  Relative Name and Phone Number:  Delano Artuso, daughter, phone: [phone]    Current Level of Care: Hospital Recommended Level of Care: Skilled Nursing Facility Prior Approval Number:    Date Approved/Denied: 12/04/08 PASRR Number: [id number]  Discharge Plan: SNF    Current Diagnoses: Patient Active Problem List   Diagnosis Date Noted   Hyponatremia 07/18/2023   Class 1 obesity 07/17/2023   Failure to thrive in adult 07/17/2023   Anorexia 07/17/2023   Adrenal insufficiency (HCC) 07/17/2023   Acute renal failure superimposed on chronic kidney disease (HCC) 07/14/2023   Cellulitis of left foot 07/14/2023   Pressure injury of skin 07/14/2023   Toxic metabolic encephalopathy 07/14/2023   Septic shock (HCC) 07/10/2023   Mixed hyperlipidemia 10/09/2022   Acquired hypothyroidism 10/09/2022   NSVT (nonsustained ventricular tachycardia) (HCC) 02/16/2017   Paroxysmal atrial fibrillation (HCC) 03/28/2015   Leg swelling 07/27/2014   NICM (nonischemic cardiomyopathy) (HCC) 04/03/2014   HTN (hypertension) 04/03/2014   S/P implantation of automatic cardioverter/defibrillator (AICD) 04/03/2014   Prolonged Q-T interval on ECG 04/03/2014   Encounter for monitoring amiodarone  therapy 04/03/2014   Sinus node dysfunction/bradycardia 04/01/2014   Elevated transaminase level 04/01/2014   Cardiac arrest (HCC) 03/30/2014    Orientation RESPIRATION BLADDER Height & Weight     Self  Normal External catheter Weight: 87.4 kg Height:  5\' 3"  (160 cm)  BEHAVIORAL SYMPTOMS/MOOD  NEUROLOGICAL BOWEL NUTRITION STATUS      Incontinent Diet (Please see discharge summary)  AMBULATORY STATUS COMMUNICATION OF NEEDS Skin   Extensive Assist Verbally Skin abrasions, Bruising (Dry, Ecchymosis, scattered)                       Personal Care Assistance Level of Assistance  Bathing, Dressing, Total care Bathing Assistance: Maximum assistance   Dressing Assistance: Maximum assistance Total Care Assistance: Maximum assistance   Functional Limitations Info  Sight Sight Info: Impaired (wears glasses)        SPECIAL CARE FACTORS FREQUENCY  PT (By licensed PT), OT (By licensed OT)     PT Frequency: 5x per week OT Frequency: 5x per week            Contractures      Additional Factors Info  Code Status, Allergies Code Status Info: DNR Allergies Info: Entresto  (sacubitril-valsartan)  No severity or reactions specified           Current Medications (07/18/2023):  This is the current hospital active medication list Current Facility-Administered Medications  Medication Dose Route Frequency Provider Last Rate Last Admin   0.9 %  sodium chloride  infusion   Intravenous Continuous Zhang, Dekui, MD 100 mL/hr at 07/18/23 0940 New Bag at 07/18/23 0940   amoxicillin -clavulanate (AUGMENTIN ) 500-125 MG per tablet 1 tablet  1 tablet Oral Q12H Donaciano Frizzle, MD   1 tablet at 07/18/23 0931   apixaban  (ELIQUIS ) tablet 2.5 mg  2.5 mg Oral BID Chappell, Alex B, RPH   2.5 mg at 07/18/23 0930   ascorbic acid  (VITAMIN C ) tablet 500 mg  500 mg Oral BID Aleskerov, Fuad, MD  500 mg at 07/18/23 0931   Chlorhexidine  Gluconate Cloth 2 % PADS 6 each  6 each Topical Q0600 Anola King, NP   6 each at 07/18/23 0505   collagenase  (SANTYL ) ointment   Topical Daily Evertt Hoe, DPM   Given at 07/18/23 9629   docusate sodium  (COLACE) capsule 100 mg  100 mg Oral BID PRN Aleskerov, Fuad, MD       feeding supplement (ENSURE ENLIVE / ENSURE PLUS) liquid 237 mL  237 mL Oral TID BM  Aleskerov, Fuad, MD   237 mL at 07/17/23 2000   levothyroxine  (SYNTHROID ) tablet 75 mcg  75 mcg Oral Q0600 Grubb, Rodney D, RPH   75 mcg at 07/18/23 0505   lidocaine  (LIDODERM ) 5 % 1 patch  1 patch Transdermal Q24H Rust-Chester, Gara July, NP   1 patch at 07/13/23 0507   lipase/protease/amylase (CREON ) capsule 24,000 Units  24,000 Units Oral TID AC Zhang, Dekui, MD   24,000 Units at 07/18/23 0930   liver oil-zinc  oxide (DESITIN) 40 % ointment   Topical BID Aleskerov, Fuad, MD   Given at 07/18/23 5284   megestrol  (MEGACE ) 400 MG/10ML suspension 400 mg  400 mg Oral Daily Zhang, Dekui, MD   400 mg at 07/18/23 0930   midodrine  (PROAMATINE ) tablet 5 mg  5 mg Oral TID WC Zhang, Dekui, MD       multivitamin (RENA-VIT) tablet 1 tablet  1 tablet Oral QHS Aleskerov, Fuad, MD   1 tablet at 07/17/23 2121   Oral care mouth rinse  15 mL Mouth Rinse PRN Donaciano Frizzle, MD       pantoprazole  (PROTONIX ) EC tablet 40 mg  40 mg Oral Daily Zhang, Dekui, MD   40 mg at 07/18/23 1324   pneumococcal 20-valent conjugate vaccine (PREVNAR 20) injection 0.5 mL  0.5 mL Intramuscular Tomorrow-1000 Erskin Hearing, MD       predniSONE  (DELTASONE ) tablet 5 mg  5 mg Oral Q breakfast Zhang, Dekui, MD   5 mg at 07/18/23 0930   QUEtiapine  (SEROQUEL ) tablet 25 mg  25 mg Oral QHS Rust-Chester, Britton L, NP   25 mg at 07/17/23 2119   sodium chloride  flush (NS) 0.9 % injection 10-40 mL  10-40 mL Intracatheter Q12H Rust-Chester, Britton L, NP   10 mL at 07/17/23 2120   sodium chloride  flush (NS) 0.9 % injection 10-40 mL  10-40 mL Intracatheter PRN Rust-Chester, Gara July, NP       Facility-Administered Medications Ordered in Other Encounters  Medication Dose Route Frequency Provider Last Rate Last Admin   technetium tetrofosmin  (TC-MYOVIEW ) injection 23.3 millicurie  23.3 millicurie Intravenous Once PRN Ross, Paula V, MD         Discharge Medications: Please see discharge summary for a list of discharge medications.  Relevant  Imaging Results:  Relevant Lab Results:   Additional Information SSN: [id number]  Crayton Docker, RN

## 2023-07-18 NOTE — Progress Notes (Signed)
 Palliative Care Progress Note, Assessment & Plan   Patient Name: Ethan Vega.       Date: 07/18/2023 DOB: 11-27-1945  Age: 78 y.o. MRN#: [id number] Attending Physician: Donaciano Frizzle, MD Primary Care Physician: Shari Daughters, MD Admit Date: 07/10/2023  Subjective: Patient is sitting up in bed, asleep, in no apparent distress.  He easily awakes to my presence.  He is alert and oriented to self and place.  He acknowledges my presence and is able to make his wishes known.  No family or friends present during my visit.  HPI: 78 y.o. male  with past medical history of  essential hypertension, nonischemic cardiomyopathy history of paroxysmal A-fib and ventricular tachycardia, dyslipidemia. He presented to hospital 07/10/23 to have ICD generator changed. Staff found him to have open wounds to LLE, confusion and hypotension. He was subsequently sent to ED for evaluation. ED workup found patient to have AKI, leukocytosis and hypotension with creatinine 5.5 (baseline 1.7), Na+ 134, K+ 5.4, BUN 151, BNP 672.1, Trop 164, Lactic 2.2 and WBC 16.1.   He received IV fluid resuscitation but required pressor support. Daughter at bedside reported that patient lives alone, performing his ADLs independently. She adds that he complained of diarrhea and poor PO intake 2 weeks prior. Pt was unaware of the open sore to his LLE but did confirm chronic lower extremity pitting edema. He was admitted to ICU for circulatory shock with AKI.    Palliative care was consulted to discuss goals of care.  Summary of counseling/coordination of care: Extensive chart review completed prior to meeting patient including labs, vital signs, imaging, progress notes, orders, and available advanced directive documents from current and previous  encounters.   After reviewing the patient's chart and assessing the patient at bedside, I spoke with patient in regards to symptom management and goals of care.   Symptoms assessed.  Patient has no acute physical complaints.  He shares that the kitchen is sending him too much food and he feels bad wasting it.  I discussed plan of care and next steps with patient.  He shares he is "not sure about all that".  We discussed that patient is showing signs that he will no longer need immediate dialysis but that his creatinine is being monitored daily.  Additionally, discussed need for increased mobility to improve his functional status.  Thus, patient is being recommended to go to a short-term nursing facility for rehab.  He shares that he is "willing to do what ever it takes".  I shared medical team's concerns that patient had made comments earlier today that he was ready to give up.  He shares that he is met with the doctor and understands that he has to still try to "push forward to get better".  When asked to elaborate more on his thoughts and feelings regarding his current medical situation, he shares he is not ready to give up and is ready to do what he needs to do to get better.  No adjustment to plan of care or MAR at this time.  Plan remains for patient to transfer to SNF for rehab.  Symptom burden is low.  After visiting with the patient, I  spoke with his daughter Ethan Vega over the phone.  Medical update given.  Discussed plan remains for patient to discharge to SNF for rehab.  In light of patient transferring to another medical facility, discussed completing a MOST form.  Copy of MOST form left at bedside for review.  [name] was appreciative of the discussion and share she will review it.  Advised her that any MD/PA/DO/NP can complete with her either during this hospitalization or after discharge.  PMD will step back from daily visits and monitor him peripherally.  Please reengage with PMT if goals  change, at patient/family's request, or if patient's health deteriorates during hospitalization.  I plan to follow-up with patient upon my return next week.  Should acute palliative needs arise before then, please utilize [facility] for available palliative providers.  Physical Exam Vitals reviewed.  Constitutional:      General: He is not in acute distress.    Appearance: He is normal weight.  HENT:     Head: Normocephalic.     Nose: Nose normal.     Mouth/Throat:     Mouth: Mucous membranes are moist.  Eyes:     Pupils: Pupils are equal, round, and reactive to light.  Pulmonary:     Effort: Pulmonary effort is normal.  Abdominal:     Palpations: Abdomen is soft.  Musculoskeletal:     Comments: Generalized weakness, MAETC  Skin:    General: Skin is warm and dry.  Neurological:     Mental Status: He is alert.     Comments: Oriented to self and place  Psychiatric:        Mood and Affect: Mood normal.        Behavior: Behavior normal.        Thought Content: Thought content normal.        Judgment: Judgment normal.             Total Time 35 minutes   Time spent includes: Detailed review of medical records (labs, imaging, vital signs), medically appropriate exam (mental status, respiratory, cardiac, skin), discussed with treatment team, counseling and educating patient, family and staff, documenting clinical information, medication management and coordination of care.  Judeen Nose L. Rebbeca Campi, DNP, FNP-BC Palliative Medicine Team

## 2023-07-18 NOTE — Plan of Care (Signed)
  Problem: Education: Goal: Knowledge of General Education information will improve Description: Including pain rating scale, medication(s)/side effects and non-pharmacologic comfort measures Outcome: Progressing   Problem: Clinical Measurements: Goal: Ability to maintain clinical measurements within normal limits will improve Outcome: Progressing   Problem: Activity: Goal: Risk for activity intolerance will decrease Outcome: Progressing   Problem: Nutrition: Goal: Adequate nutrition will be maintained Outcome: Progressing   Problem: Coping: Goal: Level of anxiety will decrease Outcome: Progressing   Problem: Elimination: Goal: Will not experience complications related to bowel motility Outcome: Progressing   Problem: Pain Managment: Goal: General experience of comfort will improve and/or be controlled Outcome: Progressing   Problem: Safety: Goal: Ability to remain free from injury will improve Outcome: Progressing   Problem: Skin Integrity: Goal: Risk for impaired skin integrity will decrease Outcome: Progressing

## 2023-07-18 NOTE — Progress Notes (Signed)
 [facility]  ROUNDING NOTE   Subjective:   Patient seen sitting up in bed Alert Tolerating meals   05/01 0701 - 05/02 0700 In: 83 [I.V.:83] Out: 800 [Urine:800] Lab Results  Component Value Date   CREATININE 1.97 (H) 07/18/2023   CREATININE 1.96 (H) 07/17/2023   CREATININE 1.48 (H) 07/16/2023     Objective:  Vital signs in last 24 hours:  Temp:  [97.5 F (36.4 C)-98.3 F (36.8 C)] 98.1 F (36.7 C) (05/02 1144) Pulse Rate:  [85-91] 87 (05/02 1144) Resp:  [18-20] 19 (05/02 1144) BP: (91-117)/(54-82) 109/71 (05/02 1144) SpO2:  [97 %-100 %] 100 % (05/02 1144) Weight:  [87.4 kg] 87.4 kg (05/02 0327)  Weight change: 0.2 kg Filed Weights   07/16/23 0345 07/17/23 0500 07/18/23 0327  Weight: 83.9 kg 87.2 kg 87.4 kg    Intake/Output: I/O last 3 completed shifts: In: 210.3 [I.V.:210.3] Out: 1225 [Urine:1225]   Intake/Output this shift:  No intake/output data recorded.  Physical Exam: General: NAD  Head: Normocephalic, atraumatic. Moist oral mucosal membranes  Eyes: Anicteric  Lungs:  Clear to auscultation, normal effort  Heart: Regular rate and rhythm  Abdomen:  Soft, nontender  Extremities: trace peripheral edema.  Neurologic: Awake, alert, conversant  Skin: No lesions  Access: Rt femoral HD temp cath    Basic Metabolic Panel: Recent Labs  Lab 07/13/23 0317 07/13/23 1546 07/14/23 0409 07/14/23 1527 07/15/23 0509 07/15/23 1640 07/16/23 0326 07/17/23 0421 07/18/23 0313  NA 137   < > 137 136 133* 138 137 136 132*  K 4.2   < > 4.1 3.6 4.1 4.2 4.1 4.6 4.4  CL 103   < > 103 106 102 102 104 106 104  CO2 22   < > 23 24 23 24 23 23  21*  GLUCOSE 132*   < > 111* 127* 92 96 96 97 111*  BUN 13   < > 12 13 11 13 20  30* 38*  CREATININE 0.99   < > 1.07 1.15 1.10 1.02 1.48* 1.96* 1.97*  CALCIUM  8.1*   < > 8.0* 8.1* 7.3* 7.9* 8.2* 8.4* 8.4*  MG 2.7*  --  2.5*  --  2.6*  --  2.6* 2.4  --   PHOS 2.2*   < > 1.9* 3.0 1.6* 3.9 4.4  --   --    < > =  values in this interval not displayed.    Liver Function Tests: Recent Labs  Lab 07/14/23 1527 07/15/23 0509 07/15/23 1640 07/16/23 0326 07/17/23 0421  AST  --   --   --   --  31  ALT  --   --   --   --  39  ALKPHOS  --   --   --   --  41  BILITOT  --   --   --   --  1.5*  PROT  --   --   --   --  4.8*  ALBUMIN 2.3* 2.5* 2.4* 2.3* 2.3*   No results for input(s): "LIPASE", "AMYLASE" in the last 168 hours.  No results for input(s): "AMMONIA" in the last 168 hours.  CBC: Recent Labs  Lab 07/13/23 0317 07/14/23 0409 07/15/23 0509 07/16/23 0326 07/17/23 0421  WBC 17.6* 12.6* 13.6* 11.0* 10.5  HGB 13.9 12.3* 12.6* 12.1* 12.5*  HCT 39.4 35.2* 37.0* 36.1* 37.0*  MCV 92.1 91.9 94.4 96.8 96.6  PLT 208 188 175 144* 153    Cardiac Enzymes: No results for input(s): "CKTOTAL", "  CKMB", "CKMBINDEX", "TROPONINI" in the last 168 hours.  BNP: Invalid input(s): "POCBNP"  CBG: Recent Labs  Lab 07/11/23 2239 07/12/23 2007 07/13/23 0000 07/13/23 0741 07/15/23 1707  GLUCAP 145* 129* 136* 111* 98    Microbiology: Results for orders placed or performed during the hospital encounter of 07/10/23  Resp panel by RT-PCR (RSV, Flu A&B, Covid) Anterior Nasal Swab     Status: None   Collection Time: 07/10/23  8:50 AM   Specimen: Anterior Nasal Swab  Result Value Ref Range Status   SARS Coronavirus 2 by RT PCR NEGATIVE NEGATIVE Final    Comment: (NOTE) SARS-CoV-2 target nucleic acids are NOT DETECTED.  The SARS-CoV-2 RNA is generally detectable in upper respiratory specimens during the acute phase of infection. The lowest concentration of SARS-CoV-2 viral copies this assay can detect is 138 copies/mL. A negative result does not preclude SARS-Cov-2 infection and should not be used as the sole basis for treatment or other patient management decisions. A negative result may occur with  improper specimen collection/handling, submission of specimen other than nasopharyngeal swab,  presence of viral mutation(s) within the areas targeted by this assay, and inadequate number of viral copies(<138 copies/mL). A negative result must be combined with clinical observations, patient history, and epidemiological information. The expected result is Negative.  Fact Sheet for Patients:  BloggerCourse.com  Fact Sheet for Healthcare Providers:  SeriousBroker.it  This test is no t yet approved or cleared by the [place]  FDA and  has been authorized for detection and/or diagnosis of SARS-CoV-2 by FDA under an Emergency Use Authorization (EUA). This EUA will remain  in effect (meaning this test can be used) for the duration of the COVID-19 declaration under Section 564(b)(1) of the Act, 21 U.S.C.section 360bbb-3(b)(1), unless the authorization is terminated  or revoked sooner.       Influenza A by PCR NEGATIVE NEGATIVE Final   Influenza B by PCR NEGATIVE NEGATIVE Final    Comment: (NOTE) The Xpert Xpress SARS-CoV-2/FLU/RSV plus assay is intended as an aid in the diagnosis of influenza from Nasopharyngeal swab specimens and should not be used as a sole basis for treatment. Nasal washings and aspirates are unacceptable for Xpert Xpress SARS-CoV-2/FLU/RSV testing.  Fact Sheet for Patients: BloggerCourse.com  Fact Sheet for Healthcare Providers: SeriousBroker.it  This test is not yet approved or cleared by the [place]  FDA and has been authorized for detection and/or diagnosis of SARS-CoV-2 by FDA under an Emergency Use Authorization (EUA). This EUA will remain in effect (meaning this test can be used) for the duration of the COVID-19 declaration under Section 564(b)(1) of the Act, 21 U.S.C. section 360bbb-3(b)(1), unless the authorization is terminated or revoked.     Resp Syncytial Virus by PCR NEGATIVE NEGATIVE Final    Comment: (NOTE) Fact Sheet for  Patients: BloggerCourse.com  Fact Sheet for Healthcare Providers: SeriousBroker.it  This test is not yet approved or cleared by the [place]  FDA and has been authorized for detection and/or diagnosis of SARS-CoV-2 by FDA under an Emergency Use Authorization (EUA). This EUA will remain in effect (meaning this test can be used) for the duration of the COVID-19 declaration under Section 564(b)(1) of the Act, 21 U.S.C. section 360bbb-3(b)(1), unless the authorization is terminated or revoked.  Performed at [facility], [address] [place], [place] [address]   Blood Culture (routine x 2)     Status: None   Collection Time: 07/10/23  8:50 AM   Specimen: BLOOD  Result Value Ref  Range Status   Specimen Description BLOOD RIGHT AC  Final   Special Requests   Final    BOTTLES DRAWN AEROBIC AND ANAEROBIC Blood Culture results may not be optimal due to an inadequate volume of blood received in culture bottles   Culture   Final    NO GROWTH 5 DAYS Performed at [facility], [address] [place], [place] [address]    Report Status 07/15/2023 FINAL  Final  Blood Culture (routine x 2)     Status: None   Collection Time: 07/10/23  8:05 PM   Specimen: BLOOD RIGHT HAND  Result Value Ref Range Status   Specimen Description BLOOD RIGHT HAND  Final   Special Requests   Final    BOTTLES DRAWN AEROBIC AND ANAEROBIC Blood Culture results may not be optimal due to an inadequate volume of blood received in culture bottles   Culture   Final    NO GROWTH 5 DAYS Performed at [facility], [address], [place], [place] [address]    Report Status 07/15/2023 FINAL  Final  MRSA Next Gen by PCR, Nasal     Status: None   Collection Time: 07/10/23  8:35 PM   Specimen: Nasal Mucosa; Nasal Swab  Result Value Ref Range Status   MRSA by PCR Next Gen NOT DETECTED NOT DETECTED Final    Comment: (NOTE) The GeneXpert MRSA  Assay (FDA approved for NASAL specimens only), is one component of a comprehensive MRSA colonization surveillance program. It is not intended to diagnose MRSA infection nor to guide or monitor treatment for MRSA infections. Test performance is not FDA approved in patients less than 81 years old. Performed at [facility], [address] [place], [place] [address]     Coagulation Studies: No results for input(s): "LABPROT", "INR" in the last 72 hours.   Urinalysis: No results for input(s): "COLORURINE", "LABSPEC", "PHURINE", "GLUCOSEU", "HGBUR", "BILIRUBINUR", "KETONESUR", "PROTEINUR", "UROBILINOGEN", "NITRITE", "LEUKOCYTESUR" in the last 72 hours.  Invalid input(s): "APPERANCEUR"     Imaging: US  ARTERIAL ABI (SCREENING LOWER EXTREMITY) Result Date: 07/18/2023 CLINICAL DATA:  Ulcer of left foot. EXAM: NONINVASIVE PHYSIOLOGIC VASCULAR STUDY OF BILATERAL LOWER EXTREMITIES TECHNIQUE: Evaluation of both lower extremities were performed at rest, including calculation of ankle-brachial indices with single level Doppler, pressure and pulse volume recording. COMPARISON:  None Available. FINDINGS: Right ABI:  Could not be calculated Left ABI:  Could not be calculated Right Lower Extremity: Incompressible vessels and could not calculate an ankle-brachial index. Waveforms in the right ankle are irregular. However, there are some triphasic waveforms in the right posterior tibial artery. Left Lower Extremity: Incompressible vessels and could not calculate an ankle-brachial index. Irregular waveforms in the left ankle with some possible triphasic waveforms in the left posterior tibial artery. IMPRESSION: 1. Incompressible vessels and could not calculate ankle-brachial indices. 2. Irregular waveforms in the ankles bilaterally. Electronically Signed   By: Elene Griffes M.D.   On: 07/18/2023 08:41      Medications:    sodium chloride  50 mL/hr at 07/18/23 1041    amoxicillin -clavulanate  1 tablet  Oral Q12H   apixaban   2.5 mg Oral BID   vitamin C   500 mg Oral BID   Chlorhexidine  Gluconate Cloth  6 each Topical Q0600   collagenase    Topical Daily   feeding supplement  237 mL Oral TID BM   levothyroxine   75 mcg Oral Q0600   lidocaine   1 patch Transdermal Q24H   lipase/protease/amylase  24,000 Units  Oral TID AC   liver oil-zinc  oxide   Topical BID   megestrol   400 mg Oral Daily   midodrine   5 mg Oral TID WC   multivitamin  1 tablet Oral QHS   pantoprazole   40 mg Oral Daily   pneumococcal 20-valent conjugate vaccine  0.5 mL Intramuscular Tomorrow-1000   predniSONE   5 mg Oral Q breakfast   QUEtiapine   25 mg Oral QHS   sodium chloride  flush  10-40 mL Intracatheter Q12H   docusate sodium , mouth rinse, sodium chloride  flush  Assessment/ Plan:  Mr. Ethan Vega. is a 78 y.o.  male  with a PMHx of hypertension, nonischemic cardiomyopathy, paroxysmal atrial fibrillation, history of ventricular tachycardia, hyperlipidemia, history of AICD placement, history of skin cancer, dementia, who was admitted to [facility] on 07/10/2023 for Cellulitis of left foot [L03.116] Septic shock (HCC) [A41.9, R65.21] Sepsis with acute renal failure and septic shock, due to unspecified organism, unspecified acute renal failure type (HCC) [A41.9, R65.21, N17.9] Acute renal failure superimposed on chronic kidney disease, unspecified acute renal failure type, unspecified CKD stage (HCC) [N17.9, N18.9]   Acute kidney injury/chronic kidney disease stage IIIb baseline EGFR 39 on 06/26/23/hyperkalemia.  Patient now with acute kidney injury secondary to ATN most likely.  Patient has had rather poor p.o. intake at home over the past several days prior to admission according to the daughter.  CT scan abdomen pelvis negative for obstruction.  Given severity of acute kidney injury recommend initiation of continuous renal placement therapy.  Appreciate critical care placing right femoral temporary dialysis catheter.  CRRT  stopped on 4/29.  Creatinine stable today with increasing urine output. Will continue to hold further dialysis and monitor renal indices.   05/01 0701 - 05/02 0700 In: 83 [I.V.:83] Out: 800 [Urine:800] Lab Results  Component Value Date   CREATININE 1.97 (H) 07/18/2023   CREATININE 1.96 (H) 07/17/2023   CREATININE 1.48 (H) 07/16/2023      2.  Acute metabolic acidosis.  Slightly decreased but acceptable.  3.  Hypotension.  Weaned off pressors. BP remains soft. Prescribed Midodrine  5mg  three times daily.     LOS: 8 Rollie Hynek 5/2/20251:46 PM

## 2023-07-18 NOTE — Addendum Note (Signed)
 Addended by: Lott Rouleau A on: 07/18/2023 09:49 AM   Modules accepted: Orders

## 2023-07-18 NOTE — TOC Progression Note (Addendum)
 Transition of Care (TOC) - Progression Note    Patient Details  Name: Ethan Vega. MRN: [id number] Date of Birth: [birth year]  Transition of Care [facility]) CM/SW Contact  Crayton Docker, RN Phone Number: 07/18/2023, 10:00 AM  Clinical Narrative:     CM faxed SNF referral to [facility], [facility] and [facility] and [place].  Patient will require authorization for short term rehab and BLS transport.  CM follow up call placed to [facility], Admissions, [employer]. No answer, CM left message for return call. CM follow up call placed to [facility], Admissions, [facility]. Per Bonnell Butcher, will review SNF referral and call CM back.   Expected Discharge Plan: Home/Self Care Barriers to Discharge: Continued Medical Work up  Expected Discharge Plan and Services   Discharge Planning Services: CM Consult   Living arrangements for the past 2 months: Single Family Home (4 steps , handrails x 1)     Social Determinants of Health (SDOH) Interventions SDOH Screenings   Food Insecurity: Patient Unable To Answer (07/10/2023)  Housing: Patient Unable To Answer (07/10/2023)  Transportation Needs: Patient Unable To Answer (07/10/2023)  Utilities: Patient Unable To Answer (07/10/2023)  Depression (PHQ2-9): Low Risk  (01/15/2023)  Social Connections: Patient Unable To Answer (07/10/2023)  Tobacco Use: Low Risk  (07/10/2023)    Readmission Risk Interventions     No data to display

## 2023-07-18 NOTE — Progress Notes (Signed)
 Physical Therapy Treatment Patient Details Name: Ethan Vega. MRN: [id number] DOB: Mar 08, 1946 Today's Date: 07/18/2023   History of Present Illness Patient is a 78 year old male with renal failure, toxic metabolic encephalopathy, septic shock with LE cellulitis. Required CRRT and pressor support. History of HTN, atrial fibrillation, ventricular tachycardia, AICD placement, dementia.    PT Comments  Pt was asleep, long sitting in bed, upon arrival. He is disoriented but cooperative. Does have some anxiety noted with mobility but is able to be calmed with relaxation/breathing techniques. Pt was able to exit L side of bed, stand a few times EOB prior to returning to long sitting. Assisted pt with lunch order. Tem fem cath still in place. Acute PT will continue to follow and progress as able per current POC.    If plan is discharge home, recommend the following: A lot of help with walking and/or transfers;A lot of help with bathing/dressing/bathroom;Assistance with cooking/housework;Assistance with feeding;Direct supervision/assist for medications management;Direct supervision/assist for financial management;Assist for transportation;Help with stairs or ramp for entrance;Supervision due to cognitive status   Can travel by private vehicle     No  Equipment Recommendations  Other (comment) (defer to next level of care)       Precautions / Restrictions Precautions Precautions: Fall Recall of Precautions/Restrictions: Impaired Precaution/Restrictions Comments: right femoral temporary triple lumen Restrictions Weight Bearing Restrictions Per Provider Order: No     Mobility  Bed Mobility Overal bed mobility: Needs Assistance Bed Mobility: Supine to Sit  Supine to sit: Max assist, HOB elevated, Used rails Sit to supine: Max assist, HOB elevated, Used rails   Transfers Overall transfer level: Needs assistance Equipment used: Rolling walker (2 wheels) Transfers: Sit to/from Stand Sit to  Stand: Max assist, From elevated surface  General transfer comment: pt stood EOB 2 x prior to requesting to return to bed for lunch. elevated bed height.    Ambulation/Gait    General Gait Details: NT: temp fem cath    Balance Overall balance assessment: Needs assistance Sitting-balance support: Feet supported Sitting balance-Leahy Scale: Fair Sitting balance - Comments: mostly CGA due to pt's anxiety and for safety. occasional close supervision   Standing balance support: Bilateral upper extremity supported, During functional activity, Reliant on assistive device for balance Standing balance-Leahy Scale: Poor Standing balance comment: pt remains high fall risk       Communication Communication Communication: No apparent difficulties  Cognition Arousal: Alert Behavior During Therapy: Anxious (with mobility/transfers)   PT - Cognitive impairments: History of cognitive impairments    PT - Cognition Comments: Pt with hx of dementia, oriented to time, place, self but not situation, fearful of falling, speaks about coming from another hospital. Following commands: Impaired      Cueing Cueing Techniques: Verbal cues, Tactile cues, Gestural cues, Visual cues     General Comments General comments (skin integrity, edema, etc.): Pt was asleep upon arrival. Chartered loss adjuster assisted pt with ordering lunch. Greatly limited by temp fem cath and cognition      Pertinent Vitals/Pain Pain Assessment Pain Assessment: No/denies pain Faces Pain Scale: No hurt     PT Goals (current goals can now be found in the care plan section) Acute Rehab PT Goals Patient Stated Goal: none stated Progress towards PT goals: Progressing toward goals    Frequency    Min 2X/week       AM-PAC PT "6 Clicks" Mobility   Outcome Measure  Help needed turning from your back to your side while in a  flat bed without using bedrails?: A Lot Help needed moving from lying on your back to sitting on the side of a  flat bed without using bedrails?: A Lot Help needed moving to and from a bed to a chair (including a wheelchair)?: A Lot Help needed standing up from a chair using your arms (e.g., wheelchair or bedside chair)?: Total Help needed to walk in hospital room?: Total Help needed climbing 3-5 steps with a railing? : Total 6 Click Score: 9    End of Session   Activity Tolerance: Patient tolerated treatment well;Patient limited by fatigue Patient left: in bed;with call bell/phone within reach;with bed alarm set Nurse Communication: Mobility status PT Visit Diagnosis: Unsteadiness on feet (R26.81);Muscle weakness (generalized) (M62.81);Other abnormalities of gait and mobility (R26.89);Difficulty in walking, not elsewhere classified (R26.2)     Time: 1191-4782 PT Time Calculation (min) (ACUTE ONLY): 13 min  Charges:    $Therapeutic Activity: 8-22 mins PT General Charges $$ ACUTE PT VISIT: 1 Visit                     Chester Costa PTA 07/18/23, 3:26 PM

## 2023-07-18 NOTE — Care Management Important Message (Signed)
 Important Message  Patient Details  Name: Ethan Vega. MRN: [id number] Date of Birth: 1945-12-28   Important Message Given:  Yes - Medicare IM     Eulogia Dismore W, CMA 07/18/2023, 10:35 AM

## 2023-07-18 NOTE — TOC Progression Note (Cosign Needed)
 Transition of Care (TOC) - Progression Note    Patient Details  Name: Ethan Vega. MRN: [id number] Date of Birth: 02-17-1946  Transition of Care [facility]) CM/SW Contact  Crayton Docker, RN Phone Number: 07/18/2023, 9:57 AM  Clinical Narrative:     The above named patient is recommended to go to Short Term Rehab for strengthening and gait training for balance.  It is expected that the Short Term Rehab stay will be less than 30 days.  The patient is expected to return home after Rehab.   Expected Discharge Plan: Home/Self Care Barriers to Discharge: Continued Medical Work up  Expected Discharge Plan and Services   Discharge Planning Services: CM Consult   Living arrangements for the past 2 months: Single Family Home (4 steps , handrails x 1)        Social Determinants of Health (SDOH) Interventions SDOH Screenings   Food Insecurity: Patient Unable To Answer (07/10/2023)  Housing: Patient Unable To Answer (07/10/2023)  Transportation Needs: Patient Unable To Answer (07/10/2023)  Utilities: Patient Unable To Answer (07/10/2023)  Depression (PHQ2-9): Low Risk  (01/15/2023)  Social Connections: Patient Unable To Answer (07/10/2023)  Tobacco Use: Low Risk  (07/10/2023)    Readmission Risk Interventions     No data to display

## 2023-07-18 NOTE — Progress Notes (Signed)
 Remote ICD transmission.

## 2023-07-18 NOTE — Plan of Care (Signed)
  Problem: Education: Goal: Knowledge of General Education information will improve Description: Including pain rating scale, medication(s)/side effects and non-pharmacologic comfort measures Outcome: Progressing   Problem: Health Behavior/Discharge Planning: Goal: Ability to manage health-related needs will improve Outcome: Progressing   Problem: Clinical Measurements: Goal: Ability to maintain clinical measurements within normal limits will improve Outcome: Progressing   Problem: Activity: Goal: Risk for activity intolerance will decrease Outcome: Progressing   Problem: Coping: Goal: Level of anxiety will decrease Outcome: Progressing   Problem: Elimination: Goal: Will not experience complications related to bowel motility Outcome: Progressing   Problem: Pain Managment: Goal: General experience of comfort will improve and/or be controlled Outcome: Progressing   Problem: Safety: Goal: Ability to remain free from injury will improve Outcome: Progressing   Problem: Skin Integrity: Goal: Risk for impaired skin integrity will decrease Outcome: Progressing

## 2023-07-18 NOTE — Progress Notes (Signed)
 Progress Note   Patient: Ethan Vega. UJW:[id number] DOB: [birth year] DOA: 07/10/2023     8 DOS: the patient was seen and examined on 07/18/2023   Brief hospital course: Ethan Vega is a 78 year old male with a history of essential hypertension, nonischemic cardiomyopathy history of paroxysmal A-fib and ventricular tachycardia, dyslipidemia who was seen earlier today with electrophysiology service to have ICD generator change however he was noted to have open sores on his left lower extremity and was mentating with confusion and encephalopathy with decision to abort procedure and reschedule. He was subsequently sent to the ER. His workup revealed severe AKI and leukocytosis and vitals with shock physiology. Patient received IV fluid resuscitation but despite 3 L of intravenous fluids continues to require Levophed  support. He reports diarrhea over the past week. Patient currently lives alone in his home and was previously able to perform all activities of daily living. He is unaware of the open score and cellulitic lesion of his left foot.    Principal Problem:   Septic shock (HCC) Active Problems:   NSVT (nonsustained ventricular tachycardia) (HCC)   Acquired hypothyroidism   Acute renal failure superimposed on chronic kidney disease (HCC)   Cellulitis of left foot   Pressure injury of skin   Toxic metabolic encephalopathy   Class 1 obesity   Failure to thrive in adult   Anorexia   Adrenal insufficiency (HCC)   Hyponatremia   Assessment and Plan: Septic shock secondary to left foot infection. Left foot ulcer with cellulitis. Elevated troponin secondary to septic shock. Adrenal insufficiency. Patient had received IV fluid bolus, he also received Levophed .  Currently off Levophed , blood pressure is borderline low, added midodrine .  Also check cortisol level. Blood Culture has no growth. Patient was treated with vancomycin , completed dose.  Then on Zosyn . CT scan of the foot did not  show any bony involvement. He was seen by podiatry, no need for debridement.  I will continue oral antibiotics with Augmentin  for additional 3 days to complete 10-day course.  Condition seems to be improved. Patient low blood pressure was also associated with adrenal insufficiency, cortisol level in the morning was only 3.5, started prednisone  5 mg daily. 5/2. Patient condition seem to be improving, blood pressure is more stable.  Decrease dose of midodrine .   Paroxysmal atrial fibrillation. Nonsustained ventricular tachycardia. Nonischemic cardiomyopathy with chronic systolic congestive heart failure with EF 30-35%. Appears to be euvolemic.  Discontinued heparin  drip, placed on Eliquis . Patient has not had another episode of 7 beats ventricular tachycardia, continue to follow.   Failure to thrive. Anorexia. Chronic diarrhea. 5/1.Patient has very poor appetite, currently eating 25% of meals.  No longer has diarrhea on Creon . Will start Protonix , megestrol . 5/2.  Had a good breakfast this morning.  No additional diarrhea.   Acute kidney injury chronic kidney disease stage IIIa. Hyponatremia. Followed by nephrology, briefly needing dialysis.  Renal function had improved.   Patient had very poor p.o. intake, renal function is getting worse again, most likely distal secondary to dehydration.  He is followed by nephrology, will continue some lower dose rehydration.   Acute metabolic encephalopathy. Chronic dementia. Condition appears to be stable.   Debility. PT/OT recommended nursing home placement.   Class 1 obesity. BMI 32.77 Patient currently has a very poor appetite.       Subjective:  Patient appetite seems to be better today.  Denies any short of breath.  Physical Exam: Vitals:   07/18/23 5621 07/18/23 3086 07/18/23 5784  07/18/23 1144  BP:  105/82 117/82 109/71  Pulse:  91 88 87  Resp:  18 19 19   Temp:  (!) 97.5 F (36.4 C) 97.7 F (36.5 C) 98.1 F (36.7 C)   TempSrc:  Oral    SpO2:  98% 99% 100%  Weight: 87.4 kg     Height:       General exam: Appears calm and comfortable  Respiratory system: Decreased breath sounds. Respiratory effort normal. Cardiovascular system: S1 & S2 heard, RRR. No JVD, murmurs, rubs, gallops or clicks. No pedal edema. Gastrointestinal system: Abdomen is nondistended, soft and nontender. No organomegaly or masses felt. Normal bowel sounds heard. Central nervous system: Alert and oriented x2. No focal neurological deficits. Extremities: Symmetric 5 x 5 power. Skin: No rashes, lesions or ulcers Psychiatry: Mood & affect appropriate.    Data Reviewed:  Lab results reviewed.  Family Communication: None  Disposition: Status is: Inpatient Remains inpatient appropriate because: Severity of disease, IV treatment.     Time spent: 35 minutes  Author: Donaciano Frizzle, MD 07/18/2023 12:28 PM  For on call review [email or web address].

## 2023-07-19 DIAGNOSIS — R6521 Severe sepsis with septic shock: Secondary | ICD-10-CM | POA: Diagnosis not present

## 2023-07-19 DIAGNOSIS — A419 Sepsis, unspecified organism: Secondary | ICD-10-CM | POA: Diagnosis not present

## 2023-07-19 DIAGNOSIS — N171 Acute kidney failure with acute cortical necrosis: Secondary | ICD-10-CM | POA: Diagnosis not present

## 2023-07-19 DIAGNOSIS — R627 Adult failure to thrive: Secondary | ICD-10-CM | POA: Diagnosis not present

## 2023-07-19 LAB — BASIC METABOLIC PANEL WITH GFR
Anion gap: 10 (ref 5–15)
BUN: 38 mg/dL — ABNORMAL HIGH (ref 8–23)
CO2: 22 mmol/L (ref 22–32)
Calcium: 8.9 mg/dL (ref 8.9–10.3)
Chloride: 103 mmol/L (ref 98–111)
Creatinine, Ser: 1.8 mg/dL — ABNORMAL HIGH (ref 0.61–1.24)
GFR, Estimated: 38 mL/min — ABNORMAL LOW (ref >60)
Glucose, Bld: 96 mg/dL (ref 70–99)
Potassium: 4.9 mmol/L (ref 3.5–5.1)
Sodium: 135 mmol/L (ref 135–145)

## 2023-07-19 LAB — MAGNESIUM: Magnesium: 2.2 mg/dL (ref 1.7–2.4)

## 2023-07-19 MED ORDER — MIDODRINE HCL 5 MG PO TABS
2.5000 mg | ORAL_TABLET | Freq: Three times a day (TID) | ORAL | Status: DC
  Administered 2023-07-19 (×2): 2.5 mg via ORAL
  Filled 2023-07-19 (×2): qty 1

## 2023-07-19 NOTE — Progress Notes (Signed)
 [facility]  ROUNDING NOTE   Subjective:   Patient seen sitting up in bed Alert and oriented Daughter at the bedside Eating breakfast, appetite is improving   05/02 0701 - 05/03 0700 In: 330.2 [I.V.:330.2] Out: 1200 [Urine:1200] Lab Results  Component Value Date   CREATININE 1.80 (H) 07/19/2023   CREATININE 1.97 (H) 07/18/2023   CREATININE 1.96 (H) 07/17/2023     Objective:  Vital signs in last 24 hours:  Temp:  [97.6 F (36.4 C)-98 F (36.7 C)] 97.6 F (36.4 C) (05/03 0908) Pulse Rate:  [69-92] 90 (05/03 0908) Resp:  [18-20] 20 (05/03 0908) BP: (98-116)/(62-70) 98/62 (05/03 0908) SpO2:  [98 %-99 %] 99 % (05/03 0908)  Weight change:  Filed Weights   07/16/23 0345 07/17/23 0500 07/18/23 0327  Weight: 83.9 kg 87.2 kg 87.4 kg    Intake/Output: I/O last 3 completed shifts: In: 330.2 [I.V.:330.2] Out: 1700 [Urine:1700]   Intake/Output this shift:  No intake/output data recorded.  Physical Exam: General: NAD  Head: Normocephalic, atraumatic. Moist oral mucosal membranes  Eyes: Anicteric  Lungs:  Clear to auscultation, normal effort  Heart: Regular rate and rhythm  Abdomen:  Soft, nontender  Extremities: trace peripheral edema.  Neurologic: Awake, alert, conversant  Skin: No lesions  Access: Rt femoral HD temp cath    Basic Metabolic Panel: Recent Labs  Lab 07/14/23 0409 07/14/23 1527 07/15/23 0509 07/15/23 1640 07/16/23 0326 07/17/23 0421 07/18/23 0313 07/19/23 0448  NA 137 136 133* 138 137 136 132* 135  K 4.1 3.6 4.1 4.2 4.1 4.6 4.4 4.9  CL 103 106 102 102 104 106 104 103  CO2 23 24 23 24 23 23  21* 22  GLUCOSE 111* 127* 92 96 96 97 111* 96  BUN 12 13 11 13 20  30* 38* 38*  CREATININE 1.07 1.15 1.10 1.02 1.48* 1.96* 1.97* 1.80*  CALCIUM  8.0* 8.1* 7.3* 7.9* 8.2* 8.4* 8.4* 8.9  MG 2.5*  --  2.6*  --  2.6* 2.4  --  2.2  PHOS 1.9* 3.0 1.6* 3.9 4.4  --   --   --     Liver Function Tests: Recent Labs  Lab 07/14/23 1527  07/15/23 0509 07/15/23 1640 07/16/23 0326 07/17/23 0421  AST  --   --   --   --  31  ALT  --   --   --   --  39  ALKPHOS  --   --   --   --  41  BILITOT  --   --   --   --  1.5*  PROT  --   --   --   --  4.8*  ALBUMIN 2.3* 2.5* 2.4* 2.3* 2.3*   No results for input(s): "LIPASE", "AMYLASE" in the last 168 hours.  No results for input(s): "AMMONIA" in the last 168 hours.  CBC: Recent Labs  Lab 07/13/23 0317 07/14/23 0409 07/15/23 0509 07/16/23 0326 07/17/23 0421  WBC 17.6* 12.6* 13.6* 11.0* 10.5  HGB 13.9 12.3* 12.6* 12.1* 12.5*  HCT 39.4 35.2* 37.0* 36.1* 37.0*  MCV 92.1 91.9 94.4 96.8 96.6  PLT 208 188 175 144* 153    Cardiac Enzymes: No results for input(s): "CKTOTAL", "CKMB", "CKMBINDEX", "TROPONINI" in the last 168 hours.  BNP: Invalid input(s): "POCBNP"  CBG: Recent Labs  Lab 07/12/23 2007 07/13/23 0000 07/13/23 0741 07/15/23 1707  GLUCAP 129* 136* 111* 98    Microbiology: Results for orders placed or performed during the hospital encounter of 07/10/23  Resp panel by RT-PCR (RSV, Flu A&B, Covid) Anterior Nasal Swab     Status: None   Collection Time: 07/10/23  8:50 AM   Specimen: Anterior Nasal Swab  Result Value Ref Range Status   SARS Coronavirus 2 by RT PCR NEGATIVE NEGATIVE Final    Comment: (NOTE) SARS-CoV-2 target nucleic acids are NOT DETECTED.  The SARS-CoV-2 RNA is generally detectable in upper respiratory specimens during the acute phase of infection. The lowest concentration of SARS-CoV-2 viral copies this assay can detect is 138 copies/mL. A negative result does not preclude SARS-Cov-2 infection and should not be used as the sole basis for treatment or other patient management decisions. A negative result may occur with  improper specimen collection/handling, submission of specimen other than nasopharyngeal swab, presence of viral mutation(s) within the areas targeted by this assay, and inadequate number of viral copies(<138  copies/mL). A negative result must be combined with clinical observations, patient history, and epidemiological information. The expected result is Negative.  Fact Sheet for Patients:  BloggerCourse.com  Fact Sheet for Healthcare Providers:  SeriousBroker.it  This test is no t yet approved or cleared by the [place]  FDA and  has been authorized for detection and/or diagnosis of SARS-CoV-2 by FDA under an Emergency Use Authorization (EUA). This EUA will remain  in effect (meaning this test can be used) for the duration of the COVID-19 declaration under Section 564(b)(1) of the Act, 21 U.S.C.section 360bbb-3(b)(1), unless the authorization is terminated  or revoked sooner.       Influenza A by PCR NEGATIVE NEGATIVE Final   Influenza B by PCR NEGATIVE NEGATIVE Final    Comment: (NOTE) The Xpert Xpress SARS-CoV-2/FLU/RSV plus assay is intended as an aid in the diagnosis of influenza from Nasopharyngeal swab specimens and should not be used as a sole basis for treatment. Nasal washings and aspirates are unacceptable for Xpert Xpress SARS-CoV-2/FLU/RSV testing.  Fact Sheet for Patients: BloggerCourse.com  Fact Sheet for Healthcare Providers: SeriousBroker.it  This test is not yet approved or cleared by the [place]  FDA and has been authorized for detection and/or diagnosis of SARS-CoV-2 by FDA under an Emergency Use Authorization (EUA). This EUA will remain in effect (meaning this test can be used) for the duration of the COVID-19 declaration under Section 564(b)(1) of the Act, 21 U.S.C. section 360bbb-3(b)(1), unless the authorization is terminated or revoked.     Resp Syncytial Virus by PCR NEGATIVE NEGATIVE Final    Comment: (NOTE) Fact Sheet for Patients: BloggerCourse.com  Fact Sheet for Healthcare  Providers: SeriousBroker.it  This test is not yet approved or cleared by the [place]  FDA and has been authorized for detection and/or diagnosis of SARS-CoV-2 by FDA under an Emergency Use Authorization (EUA). This EUA will remain in effect (meaning this test can be used) for the duration of the COVID-19 declaration under Section 564(b)(1) of the Act, 21 U.S.C. section 360bbb-3(b)(1), unless the authorization is terminated or revoked.  Performed at [facility], [address] [place], [place] [address]   Blood Culture (routine x 2)     Status: None   Collection Time: 07/10/23  8:50 AM   Specimen: BLOOD  Result Value Ref Range Status   Specimen Description BLOOD RIGHT So [facility]  Final   Special Requests   Final    BOTTLES DRAWN AEROBIC AND ANAEROBIC Blood Culture results may not be optimal due to an inadequate volume of blood received in culture bottles   Culture   Final  NO GROWTH 5 DAYS Performed at [facility], [address] [place], [place] [address]    Report Status 07/15/2023 FINAL  Final  Blood Culture (routine x 2)     Status: None   Collection Time: 07/10/23  8:05 PM   Specimen: BLOOD RIGHT HAND  Result Value Ref Range Status   Specimen Description BLOOD RIGHT HAND  Final   Special Requests   Final    BOTTLES DRAWN AEROBIC AND ANAEROBIC Blood Culture results may not be optimal due to an inadequate volume of blood received in culture bottles   Culture   Final    NO GROWTH 5 DAYS Performed at [facility], [address], [place], [place] [address]    Report Status 07/15/2023 FINAL  Final  MRSA Next Gen by PCR, Nasal     Status: None   Collection Time: 07/10/23  8:35 PM   Specimen: Nasal Mucosa; Nasal Swab  Result Value Ref Range Status   MRSA by PCR Next Gen NOT DETECTED NOT DETECTED Final    Comment: (NOTE) The GeneXpert MRSA Assay (FDA approved for NASAL specimens only), is one component of a  comprehensive MRSA colonization surveillance program. It is not intended to diagnose MRSA infection nor to guide or monitor treatment for MRSA infections. Test performance is not FDA approved in patients less than 45 years old. Performed at [facility], [address] [place], [place] [address]     Coagulation Studies: No results for input(s): "LABPROT", "INR" in the last 72 hours.   Urinalysis: No results for input(s): "COLORURINE", "LABSPEC", "PHURINE", "GLUCOSEU", "HGBUR", "BILIRUBINUR", "KETONESUR", "PROTEINUR", "UROBILINOGEN", "NITRITE", "LEUKOCYTESUR" in the last 72 hours.  Invalid input(s): "APPERANCEUR"     Imaging: US  ARTERIAL ABI (SCREENING LOWER EXTREMITY) Result Date: 07/18/2023 CLINICAL DATA:  Ulcer of left foot. EXAM: NONINVASIVE PHYSIOLOGIC VASCULAR STUDY OF BILATERAL LOWER EXTREMITIES TECHNIQUE: Evaluation of both lower extremities were performed at rest, including calculation of ankle-brachial indices with single level Doppler, pressure and pulse volume recording. COMPARISON:  None Available. FINDINGS: Right ABI:  Could not be calculated Left ABI:  Could not be calculated Right Lower Extremity: Incompressible vessels and could not calculate an ankle-brachial index. Waveforms in the right ankle are irregular. However, there are some triphasic waveforms in the right posterior tibial artery. Left Lower Extremity: Incompressible vessels and could not calculate an ankle-brachial index. Irregular waveforms in the left ankle with some possible triphasic waveforms in the left posterior tibial artery. IMPRESSION: 1. Incompressible vessels and could not calculate ankle-brachial indices. 2. Irregular waveforms in the ankles bilaterally. Electronically Signed   By: Elene Griffes M.D.   On: 07/18/2023 08:41      Medications:      amoxicillin -clavulanate  1 tablet Oral Q12H   apixaban   2.5 mg Oral BID   vitamin C   500 mg Oral BID   Chlorhexidine  Gluconate Cloth  6 each  Topical Q0600   collagenase    Topical Daily   feeding supplement  237 mL Oral TID BM   levothyroxine   75 mcg Oral Q0600   lidocaine   1 patch Transdermal Q24H   lipase/protease/amylase  24,000 Units Oral TID AC   liver oil-zinc  oxide   Topical BID   megestrol   400 mg Oral Daily   midodrine   5 mg Oral TID WC   multivitamin  1 tablet Oral QHS   pantoprazole   40 mg Oral Daily   pneumococcal 20-valent conjugate vaccine  0.5 mL Intramuscular Tomorrow-1000   predniSONE   5  mg Oral Q breakfast   QUEtiapine   25 mg Oral QHS   sodium chloride  flush  10-40 mL Intracatheter Q12H   docusate sodium , mouth rinse, sodium chloride  flush  Assessment/ Plan:  Mr. Ethan Vega. is a 78 y.o.  male  with a PMHx of hypertension, nonischemic cardiomyopathy, paroxysmal atrial fibrillation, history of ventricular tachycardia, hyperlipidemia, history of AICD placement, history of skin cancer, dementia, who was admitted to [facility] on 07/10/2023 for Cellulitis of left foot [L03.116] Septic shock (HCC) [A41.9, R65.21] Sepsis with acute renal failure and septic shock, due to unspecified organism, unspecified acute renal failure type (HCC) [A41.9, R65.21, N17.9] Acute renal failure superimposed on chronic kidney disease, unspecified acute renal failure type, unspecified CKD stage (HCC) [N17.9, N18.9]   Acute kidney injury/chronic kidney disease stage IIIb baseline EGFR 39 on 06/26/23/hyperkalemia.  Patient now with acute kidney injury secondary to ATN most likely.  Patient has had rather poor p.o. intake at home over the past several days prior to admission according to the daughter.  CT scan abdomen pelvis negative for obstruction.  Given severity of acute kidney injury recommend initiation of continuous renal placement therapy.  Appreciate critical care placing right femoral temporary dialysis catheter.  CRRT stopped on 4/29.  Creatinine has been improved to 1.8. Adequate urine output recorded as well. We feel he is no  longer in need of dialysis, order placed to remove HD temp cath. Will continue to monitor renal indices.   05/02 0701 - 05/03 0700 In: 330.2 [I.V.:330.2] Out: 1200 [Urine:1200] Lab Results  Component Value Date   CREATININE 1.80 (H) 07/19/2023   CREATININE 1.97 (H) 07/18/2023   CREATININE 1.96 (H) 07/17/2023      2.  Acute metabolic acidosis.  Spontaneous correction  3.  Hypotension.  Weaned off pressors. BP remains soft. Prescribed Midodrine  5mg  three times daily.     LOS: 9 Ethan Vega 5/3/202511:44 AM

## 2023-07-19 NOTE — Progress Notes (Signed)
 Progress Note   Patient: Ethan Vega. ZOX:[id number] DOB: 1945-08-20 DOA: 07/10/2023     9 DOS: the patient was seen and examined on 07/19/2023   Brief hospital course: Shari Zeedyk is a 78 year old male with a history of essential hypertension, nonischemic cardiomyopathy history of paroxysmal A-fib and ventricular tachycardia, dyslipidemia who was seen earlier today with electrophysiology service to have ICD generator change however he was noted to have open sores on his left lower extremity and was mentating with confusion and encephalopathy with decision to abort procedure and reschedule. He was subsequently sent to the ER. His workup revealed severe AKI and leukocytosis and vitals with shock physiology. Patient received IV fluid resuscitation but despite 3 L of intravenous fluids continues to require Levophed  support. He reports diarrhea over the past week. Patient currently lives alone in his home and was previously able to perform all activities of daily living. He is unaware of the open score and cellulitic lesion of his left foot.    Principal Problem:   Septic shock (HCC) Active Problems:   NSVT (nonsustained ventricular tachycardia) (HCC)   Acquired hypothyroidism   Acute renal failure superimposed on chronic kidney disease (HCC)   Cellulitis of left foot   Pressure injury of skin   Toxic metabolic encephalopathy   Class 1 obesity   Failure to thrive in adult   Anorexia   Adrenal insufficiency (HCC)   Hyponatremia   Assessment and Plan: Septic shock secondary to left foot infection. Left foot ulcer with cellulitis. Elevated troponin secondary to septic shock. Adrenal insufficiency. Patient had received IV fluid bolus, he also received Levophed .  Currently off Levophed , blood pressure is borderline low, added midodrine .  Also check cortisol level. Blood Culture has no growth. Patient was treated with vancomycin , completed dose.  Then on Zosyn . CT scan of the foot did not  show any bony involvement. He was seen by podiatry, no need for debridement.  I will continue oral antibiotics with Augmentin  for additional 3 days to complete 10-day course.  Condition seems to be improved. Patient low blood pressure was also associated with adrenal insufficiency, cortisol level in the morning was only 3.5, started prednisone  5 mg daily. Patient condition since has been improving, continue wean down midodrine .  Antibiotic will be completed by tomorrow.   Paroxysmal atrial fibrillation. Nonsustained ventricular tachycardia. Nonischemic cardiomyopathy with chronic systolic congestive heart failure with EF 30-35%. Appears to be euvolemic.  Discontinued heparin  drip, placed on Eliquis . Patient has had another episode of 7 beats ventricular tachycardia 5/2, continue to follow.   Failure to thrive. Anorexia. Chronic diarrhea. 5/1.Patient has very poor appetite, currently eating 25% of meals.  No longer has diarrhea on Creon . Also started Protonix , megestrol . Condition is improving, starting eating.  Patient and family refused to consider any possibility of feeding tube.   Acute kidney injury chronic kidney disease stage IIIa. Hyponatremia. Followed by nephrology, briefly needing dialysis.  Renal function had improved.   Patient had very poor p.o. intake, renal function is getting worse again, most likely distal secondary to dehydration.  Renal function improved again after gentle rehydration.   Acute metabolic encephalopathy. Chronic dementia. Condition appears to be stable.   Debility. PT/OT recommended nursing home placement.   Class 1 obesity. BMI 32.77 Patient currently has a very poor appetite.  Pressure ulcer POA Pressure Injury 07/11/23 Buttocks Left Stage 2 -  Partial thickness loss of dermis presenting as a shallow open injury with a red, pink wound bed without slough. (  Active)  07/11/23 1049  Location: Buttocks  Location Orientation: Left  Staging: Stage 2  -  Partial thickness loss of dermis presenting as a shallow open injury with a red, pink wound bed without slough.  Wound Description (Comments):   Present on Admission: Yes     Pressure Injury 07/11/23 Buttocks Medial Stage 2 -  Partial thickness loss of dermis presenting as a shallow open injury with a red, pink wound bed without slough. (Active)  07/11/23 1100  Location: Buttocks  Location Orientation: Medial  Staging: Stage 2 -  Partial thickness loss of dermis presenting as a shallow open injury with a red, pink wound bed without slough.  Wound Description (Comments):   Present on Admission: Yes        Subjective:  Patient still has poor appetite, but is started eating.  No nausea vomiting.  Diarrhea has resolved.  Physical Exam: Vitals:   07/18/23 1557 07/18/23 2048 07/19/23 0602 07/19/23 0908  BP: 116/70 110/69 98/67 98/62   Pulse: 92 88 69 90  Resp: 20 18 18 20   Temp: 98 F (36.7 C) 98 F (36.7 C) 97.9 F (36.6 C) 97.6 F (36.4 C)  TempSrc: Oral Oral Oral Oral  SpO2: 98% 99%  99%  Weight:      Height:       General exam: Appears calm and comfortable  Respiratory system: Clear to auscultation. Respiratory effort normal. Cardiovascular system: S1 & S2 heard, RRR. No JVD, murmurs, rubs, gallops or clicks. No pedal edema. Gastrointestinal system: Abdomen is nondistended, soft and nontender. No organomegaly or masses felt. Normal bowel sounds heard. Central nervous system: Alert and oriented x2. No focal neurological deficits. Extremities: Symmetric 5 x 5 power. Skin: No rashes, lesions or ulcers Psychiatry: Mood & affect appropriate.    Data Reviewed:  Lab results reviewed.  Family Communication: Daughter updated at bedside.  Disposition: Status is: Inpatient Remains inpatient appropriate because: Severity of disease, unsafe discharge option.     Time spent: 36 minutes  Author: Donaciano Frizzle, MD 07/19/2023 11:48 AM  For on call review  [email or web address].

## 2023-07-19 NOTE — Plan of Care (Signed)

## 2023-07-20 ENCOUNTER — Encounter: Payer: Self-pay | Admitting: Cardiology

## 2023-07-20 DIAGNOSIS — N171 Acute kidney failure with acute cortical necrosis: Secondary | ICD-10-CM | POA: Diagnosis not present

## 2023-07-20 DIAGNOSIS — R627 Adult failure to thrive: Secondary | ICD-10-CM | POA: Diagnosis not present

## 2023-07-20 DIAGNOSIS — A419 Sepsis, unspecified organism: Secondary | ICD-10-CM | POA: Diagnosis not present

## 2023-07-20 DIAGNOSIS — R6521 Severe sepsis with septic shock: Secondary | ICD-10-CM | POA: Diagnosis not present

## 2023-07-20 LAB — BASIC METABOLIC PANEL WITH GFR
Anion gap: 8 (ref 5–15)
BUN: 47 mg/dL — ABNORMAL HIGH (ref 8–23)
CO2: 23 mmol/L (ref 22–32)
Calcium: 8.9 mg/dL (ref 8.9–10.3)
Chloride: 105 mmol/L (ref 98–111)
Creatinine, Ser: 1.78 mg/dL — ABNORMAL HIGH (ref 0.61–1.24)
GFR, Estimated: 38 mL/min — ABNORMAL LOW (ref >60)
Glucose, Bld: 115 mg/dL — ABNORMAL HIGH (ref 70–99)
Potassium: 5.1 mmol/L (ref 3.5–5.1)
Sodium: 136 mmol/L (ref 135–145)

## 2023-07-20 MED ORDER — ACETAMINOPHEN 500 MG PO TABS
1000.0000 mg | ORAL_TABLET | Freq: Four times a day (QID) | ORAL | Status: DC | PRN
  Administered 2023-07-20 – 2023-07-24 (×3): 1000 mg via ORAL
  Filled 2023-07-20 (×2): qty 2

## 2023-07-20 NOTE — Progress Notes (Signed)
 [facility]  ROUNDING NOTE   Subjective:   Patient laying in bed Partially completed breakfast tray at bedside Room air   05/03 0701 - 05/04 0700 In: 630 [P.O.:630] Out: 650 [Urine:650] Lab Results  Component Value Date   CREATININE 1.78 (H) 07/20/2023   CREATININE 1.80 (H) 07/19/2023   CREATININE 1.97 (H) 07/18/2023     Objective:  Vital signs in last 24 hours:  Temp:  [97.5 F (36.4 C)-98.9 F (37.2 C)] 97.8 F (36.6 C) (05/04 0851) Pulse Rate:  [91-103] 91 (05/04 0851) Resp:  [18-20] 18 (05/04 0851) BP: (94-119)/(60-69) 97/60 (05/04 0851) SpO2:  [94 %-98 %] 98 % (05/04 0851)  Weight change:  Filed Weights   07/16/23 0345 07/17/23 0500 07/18/23 0327  Weight: 83.9 kg 87.2 kg 87.4 kg    Intake/Output: I/O last 3 completed shifts: In: 630 [P.O.:630] Out: 1850 [Urine:1850]   Intake/Output this shift:  Total I/O In: -  Out: 200 [Urine:200]  Physical Exam: General: NAD  Head: Normocephalic, atraumatic. Dry oral mucosal membranes  Eyes: Anicteric  Lungs:  Normal effort  Heart: Regular rate and rhythm  Abdomen:  Soft, nontender  Extremities: trace peripheral edema.  Neurologic: Awake, alert, conversant  Skin: No lesions  Access: None    Basic Metabolic Panel: Recent Labs  Lab 07/14/23 0409 07/14/23 1527 07/15/23 0509 07/15/23 1640 07/16/23 0326 07/17/23 0421 07/18/23 0313 07/19/23 0448 07/20/23 0419  NA 137 136 133* 138 137 136 132* 135 136  K 4.1 3.6 4.1 4.2 4.1 4.6 4.4 4.9 5.1  CL 103 106 102 102 104 106 104 103 105  CO2 23 24 23 24 23 23  21* 22 23  GLUCOSE 111* 127* 92 96 96 97 111* 96 115*  BUN 12 13 11 13 20  30* 38* 38* 47*  CREATININE 1.07 1.15 1.10 1.02 1.48* 1.96* 1.97* 1.80* 1.78*  CALCIUM  8.0* 8.1* 7.3* 7.9* 8.2* 8.4* 8.4* 8.9 8.9  MG 2.5*  --  2.6*  --  2.6* 2.4  --  2.2  --   PHOS 1.9* 3.0 1.6* 3.9 4.4  --   --   --   --     Liver Function Tests: Recent Labs  Lab 07/14/23 1527 07/15/23 0509 07/15/23 1640  07/16/23 0326 07/17/23 0421  AST  --   --   --   --  31  ALT  --   --   --   --  39  ALKPHOS  --   --   --   --  41  BILITOT  --   --   --   --  1.5*  PROT  --   --   --   --  4.8*  ALBUMIN 2.3* 2.5* 2.4* 2.3* 2.3*   No results for input(s): "LIPASE", "AMYLASE" in the last 168 hours.  No results for input(s): "AMMONIA" in the last 168 hours.  CBC: Recent Labs  Lab 07/14/23 0409 07/15/23 0509 07/16/23 0326 07/17/23 0421  WBC 12.6* 13.6* 11.0* 10.5  HGB 12.3* 12.6* 12.1* 12.5*  HCT 35.2* 37.0* 36.1* 37.0*  MCV 91.9 94.4 96.8 96.6  PLT 188 175 144* 153    Cardiac Enzymes: No results for input(s): "CKTOTAL", "CKMB", "CKMBINDEX", "TROPONINI" in the last 168 hours.  BNP: Invalid input(s): "POCBNP"  CBG: Recent Labs  Lab 07/15/23 1707  GLUCAP 98    Microbiology: Results for orders placed or performed during the hospital encounter of 07/10/23  Resp panel by RT-PCR (RSV, Flu A&B, Covid) Anterior  Nasal Swab     Status: None   Collection Time: 07/10/23  8:50 AM   Specimen: Anterior Nasal Swab  Result Value Ref Range Status   SARS Coronavirus 2 by RT PCR NEGATIVE NEGATIVE Final    Comment: (NOTE) SARS-CoV-2 target nucleic acids are NOT DETECTED.  The SARS-CoV-2 RNA is generally detectable in upper respiratory specimens during the acute phase of infection. The lowest concentration of SARS-CoV-2 viral copies this assay can detect is 138 copies/mL. A negative result does not preclude SARS-Cov-2 infection and should not be used as the sole basis for treatment or other patient management decisions. A negative result may occur with  improper specimen collection/handling, submission of specimen other than nasopharyngeal swab, presence of viral mutation(s) within the areas targeted by this assay, and inadequate number of viral copies(<138 copies/mL). A negative result must be combined with clinical observations, patient history, and epidemiological information. The expected  result is Negative.  Fact Sheet for Patients:  BloggerCourse.com  Fact Sheet for Healthcare Providers:  SeriousBroker.it  This test is no t yet approved or cleared by the [place]  FDA and  has been authorized for detection and/or diagnosis of SARS-CoV-2 by FDA under an Emergency Use Authorization (EUA). This EUA will remain  in effect (meaning this test can be used) for the duration of the COVID-19 declaration under Section 564(b)(1) of the Act, 21 U.S.C.section 360bbb-3(b)(1), unless the authorization is terminated  or revoked sooner.       Influenza A by PCR NEGATIVE NEGATIVE Final   Influenza B by PCR NEGATIVE NEGATIVE Final    Comment: (NOTE) The Xpert Xpress SARS-CoV-2/FLU/RSV plus assay is intended as an aid in the diagnosis of influenza from Nasopharyngeal swab specimens and should not be used as a sole basis for treatment. Nasal washings and aspirates are unacceptable for Xpert Xpress SARS-CoV-2/FLU/RSV testing.  Fact Sheet for Patients: BloggerCourse.com  Fact Sheet for Healthcare Providers: SeriousBroker.it  This test is not yet approved or cleared by the [place]  FDA and has been authorized for detection and/or diagnosis of SARS-CoV-2 by FDA under an Emergency Use Authorization (EUA). This EUA will remain in effect (meaning this test can be used) for the duration of the COVID-19 declaration under Section 564(b)(1) of the Act, 21 U.S.C. section 360bbb-3(b)(1), unless the authorization is terminated or revoked.     Resp Syncytial Virus by PCR NEGATIVE NEGATIVE Final    Comment: (NOTE) Fact Sheet for Patients: BloggerCourse.com  Fact Sheet for Healthcare Providers: SeriousBroker.it  This test is not yet approved or cleared by the [place]  FDA and has been authorized for detection and/or diagnosis of  SARS-CoV-2 by FDA under an Emergency Use Authorization (EUA). This EUA will remain in effect (meaning this test can be used) for the duration of the COVID-19 declaration under Section 564(b)(1) of the Act, 21 U.S.C. section 360bbb-3(b)(1), unless the authorization is terminated or revoked.  Performed at [facility], [address] [place], [place] [address]   Blood Culture (routine x 2)     Status: None   Collection Time: 07/10/23  8:50 AM   Specimen: BLOOD  Result Value Ref Range Status   Specimen Description BLOOD RIGHT [facility]  Final   Special Requests   Final    BOTTLES DRAWN AEROBIC AND ANAEROBIC Blood Culture results may not be optimal due to an inadequate volume of blood received in culture bottles   Culture   Final    NO GROWTH 5 DAYS Performed at [facility],  [address], [place], [place] [address]    Report Status 07/15/2023 FINAL  Final  Blood Culture (routine x 2)     Status: None   Collection Time: 07/10/23  8:05 PM   Specimen: BLOOD RIGHT HAND  Result Value Ref Range Status   Specimen Description BLOOD RIGHT HAND  Final   Special Requests   Final    BOTTLES DRAWN AEROBIC AND ANAEROBIC Blood Culture results may not be optimal due to an inadequate volume of blood received in culture bottles   Culture   Final    NO GROWTH 5 DAYS Performed at [facility], [address], [place], [place] [address]    Report Status 07/15/2023 FINAL  Final  MRSA Next Gen by PCR, Nasal     Status: None   Collection Time: 07/10/23  8:35 PM   Specimen: Nasal Mucosa; Nasal Swab  Result Value Ref Range Status   MRSA by PCR Next Gen NOT DETECTED NOT DETECTED Final    Comment: (NOTE) The GeneXpert MRSA Assay (FDA approved for NASAL specimens only), is one component of a comprehensive MRSA colonization surveillance program. It is not intended to diagnose MRSA infection nor to guide or monitor treatment for MRSA infections. Test performance is not FDA  approved in patients less than 45 years old. Performed at [facility], [address] [place], [place] [address]     Coagulation Studies: No results for input(s): "LABPROT", "INR" in the last 72 hours.   Urinalysis: No results for input(s): "COLORURINE", "LABSPEC", "PHURINE", "GLUCOSEU", "HGBUR", "BILIRUBINUR", "KETONESUR", "PROTEINUR", "UROBILINOGEN", "NITRITE", "LEUKOCYTESUR" in the last 72 hours.  Invalid input(s): "APPERANCEUR"     Imaging: No results found.     Medications:      amoxicillin -clavulanate  1 tablet Oral Q12H   apixaban   2.5 mg Oral BID   vitamin C   500 mg Oral BID   Chlorhexidine  Gluconate Cloth  6 each Topical Q0600   collagenase    Topical Daily   feeding supplement  237 mL Oral TID BM   levothyroxine   75 mcg Oral Q0600   lidocaine   1 patch Transdermal Q24H   lipase/protease/amylase  24,000 Units Oral TID AC   liver oil-zinc  oxide   Topical BID   megestrol   400 mg Oral Daily   multivitamin  1 tablet Oral QHS   pantoprazole   40 mg Oral Daily   pneumococcal 20-valent conjugate vaccine  0.5 mL Intramuscular Tomorrow-1000   predniSONE   5 mg Oral Q breakfast   QUEtiapine   25 mg Oral QHS   acetaminophen , docusate sodium , mouth rinse  Assessment/ Plan:  Mr. Ethan Vega. is a 78 y.o.  male  with a PMHx of hypertension, nonischemic cardiomyopathy, paroxysmal atrial fibrillation, history of ventricular tachycardia, hyperlipidemia, history of AICD placement, history of skin cancer, dementia, who was admitted to [facility] on 07/10/2023 for Cellulitis of left foot [L03.116] Septic shock (HCC) [A41.9, R65.21] Sepsis with acute renal failure and septic shock, due to unspecified organism, unspecified acute renal failure type (HCC) [A41.9, R65.21, N17.9] Acute renal failure superimposed on chronic kidney disease, unspecified acute renal failure type, unspecified CKD stage (HCC) [N17.9, N18.9]   Acute kidney injury/chronic kidney disease stage IIIb  baseline EGFR 39 on 06/26/23/hyperkalemia.  Patient now with acute kidney injury secondary to ATN most likely.  Patient has had rather poor p.o. intake at home over the past several days prior to admission according to the daughter.  CT scan abdomen pelvis negative for obstruction.  Given  severity of acute kidney injury recommend initiation of continuous renal placement therapy.  Appreciate critical care placing right femoral temporary dialysis catheter.  CRRT stopped on 4/29. Creatinine remains stable today. Appreciate nursing for removing HD temp cath. Will continue to monitor renal indices.   05/03 0701 - 05/04 0700 In: 630 [P.O.:630] Out: 650 [Urine:650] Lab Results  Component Value Date   CREATININE 1.78 (H) 07/20/2023   CREATININE 1.80 (H) 07/19/2023   CREATININE 1.97 (H) 07/18/2023      2.  Acute metabolic acidosis.  Corrected   3.  Hypotension.  Weaned off pressors. BP remains soft.     LOS: 10 Ethan Vega 5/4/202511:49 AM

## 2023-07-20 NOTE — Progress Notes (Signed)
 Per Dr Jeane Miguel, dc tele monitoring order

## 2023-07-20 NOTE — Plan of Care (Signed)
  Problem: Education: Goal: Knowledge of General Education information will improve Description: Including pain rating scale, medication(s)/side effects and non-pharmacologic comfort measures 07/20/2023 2212 by Lupe Salk D, LPN Outcome: Progressing 07/20/2023 2211 by Lupe Salk D, LPN Outcome: Progressing   Problem: Health Behavior/Discharge Planning: Goal: Ability to manage health-related needs will improve 07/20/2023 2212 by Lupe Salk D, LPN Outcome: Progressing 07/20/2023 2211 by Lupe Salk D, LPN Outcome: Progressing   Problem: Clinical Measurements: Goal: Ability to maintain clinical measurements within normal limits will improve 07/20/2023 2212 by Lupe Salk D, LPN Outcome: Progressing 07/20/2023 2211 by Lupe Salk D, LPN Outcome: Progressing Goal: Will remain free from infection 07/20/2023 2212 by Lupe Salk D, LPN Outcome: Progressing 07/20/2023 2211 by Lupe Salk D, LPN Outcome: Progressing Goal: Diagnostic test results will improve 07/20/2023 2212 by Lupe Salk D, LPN Outcome: Progressing 07/20/2023 2211 by Lupe Salk D, LPN Outcome: Progressing Goal: Respiratory complications will improve 07/20/2023 2212 by Lupe Salk D, LPN Outcome: Progressing 07/20/2023 2211 by Lupe Salk D, LPN Outcome: Progressing Goal: Cardiovascular complication will be avoided 07/20/2023 2212 by Lupe Salk D, LPN Outcome: Progressing 07/20/2023 2211 by Lupe Salk D, LPN Outcome: Progressing   Problem: Activity: Goal: Risk for activity intolerance will decrease 07/20/2023 2212 by Lupe Salk D, LPN Outcome: Progressing 07/20/2023 2211 by Lupe Salk D, LPN Outcome: Progressing   Problem: Nutrition: Goal: Adequate nutrition will be maintained 07/20/2023 2212 by Lupe Salk D, LPN Outcome: Progressing 07/20/2023 2211 by Lupe Salk D, LPN Outcome: Progressing   Problem: Coping: Goal: Level of anxiety will decrease 07/20/2023  2212 by Lupe Salk D, LPN Outcome: Progressing 07/20/2023 2211 by Lupe Salk D, LPN Outcome: Progressing   Problem: Elimination: Goal: Will not experience complications related to bowel motility 07/20/2023 2212 by Lupe Salk D, LPN Outcome: Progressing 07/20/2023 2211 by Lupe Salk D, LPN Outcome: Progressing Goal: Will not experience complications related to urinary retention 07/20/2023 2212 by Lupe Salk D, LPN Outcome: Progressing 07/20/2023 2211 by Lupe Salk D, LPN Outcome: Progressing   Problem: Pain Managment: Goal: General experience of comfort will improve and/or be controlled 07/20/2023 2212 by Lupe Salk D, LPN Outcome: Progressing 07/20/2023 2211 by Lupe Salk D, LPN Outcome: Progressing   Problem: Safety: Goal: Ability to remain free from injury will improve 07/20/2023 2212 by Lupe Salk D, LPN Outcome: Progressing 07/20/2023 2211 by Lupe Salk D, LPN Outcome: Progressing   Problem: Skin Integrity: Goal: Risk for impaired skin integrity will decrease 07/20/2023 2212 by Lupe Salk D, LPN Outcome: Progressing 07/20/2023 2211 by Lupe Salk D, LPN Outcome: Progressing

## 2023-07-20 NOTE — Plan of Care (Signed)
   Problem: Education: Goal: Knowledge of General Education information will improve Description Including pain rating scale, medication(s)/side effects and non-pharmacologic comfort measures Outcome: Progressing

## 2023-07-20 NOTE — Progress Notes (Signed)
 Progress Note   Patient: Ethan Vega. QIH:[id number] DOB: [birth year] DOA: 07/10/2023     10 DOS: the patient was seen and examined on 07/20/2023   Brief hospital course: Ethan Vega is a 78 year old male with a history of essential hypertension, nonischemic cardiomyopathy history of paroxysmal A-fib and ventricular tachycardia, dyslipidemia who was seen earlier today with electrophysiology service to have ICD generator change however he was noted to have open sores on his left lower extremity and was mentating with confusion and encephalopathy with decision to abort procedure and reschedule. He was subsequently sent to the ER. His workup revealed severe AKI and leukocytosis and vitals with shock physiology. Patient received IV fluid resuscitation but despite 3 L of intravenous fluids continues to require Levophed  support. He reports diarrhea over the past week. Patient currently lives alone in his home and was previously able to perform all activities of daily living. He is unaware of the open score and cellulitic lesion of his left foot.    Principal Problem:   Septic shock (HCC) Active Problems:   NSVT (nonsustained ventricular tachycardia) (HCC)   Acquired hypothyroidism   Acute renal failure superimposed on chronic kidney disease (HCC)   Cellulitis of left foot   Pressure injury of skin   Toxic metabolic encephalopathy   Class 1 obesity   Failure to thrive in adult   Anorexia   Adrenal insufficiency (HCC)   Hyponatremia   Assessment and Plan:  Septic shock secondary to left foot infection. Left foot ulcer with cellulitis. Elevated troponin secondary to septic shock. Adrenal insufficiency. Patient had received IV fluid bolus, he also received Levophed .  Currently off Levophed , blood pressure is borderline low, added midodrine .  Also check cortisol level. Blood Culture has no growth. Patient was treated with vancomycin , completed dose.  Then on Zosyn . CT scan of the foot did  not show any bony involvement. He was seen by podiatry, no need for debridement.  I will continue oral antibiotics with Augmentin  for additional 3 days to complete 10-day course.  Condition seems to be improved. Patient low blood pressure was also associated with adrenal insufficiency, cortisol level in the morning was only 3.5, started prednisone  5 mg daily. Patient condition has improved, midodrine  will be discontinued today.  Antibiotics completed.   Paroxysmal atrial fibrillation. Nonsustained ventricular tachycardia. Nonischemic cardiomyopathy with chronic systolic congestive heart failure with EF 30-35%. Appears to be euvolemic.  Discontinued heparin  drip, placed on Eliquis . Patient has had another episode of 7 beats ventricular tachycardia 5/2, continue to follow.   Failure to thrive. Anorexia. Chronic diarrhea. 5/1.Patient has very poor appetite, currently eating 25% of meals.  No longer has diarrhea on Creon . Also started Protonix , megestrol . Patient condition finally improving, appetite is better.  No need for feeding tube.   Acute kidney injury chronic kidney disease stage IIIa. Hyponatremia. Followed by nephrology, briefly needing dialysis.  Renal function had improved.   Patient had very poor p.o. intake, renal function is getting worse again, most likely distal secondary to dehydration.  Renal function improved again after gentle rehydration and continue to improve.   Acute metabolic encephalopathy. Chronic dementia. Condition appears to be stable.   Debility. PT/OT recommended nursing home placement.   Class 1 obesity. BMI 32.77 Patient currently has a very poor appetite.   Pressure ulcer POA Pressure Injury 07/11/23 Buttocks Left Stage 2 -  Partial thickness loss of dermis presenting as a shallow open injury with a red, pink wound bed without slough. (Active)  07/11/23  1049  Location: Buttocks  Location Orientation: Left  Staging: Stage 2 -  Partial thickness  loss of dermis presenting as a shallow open injury with a red, pink wound bed without slough.  Wound Description (Comments):   Present on Admission: Yes     Pressure Injury 07/11/23 Buttocks Medial Stage 2 -  Partial thickness loss of dermis presenting as a shallow open injury with a red, pink wound bed without slough. (Active)  07/11/23 1100  Location: Buttocks  Location Orientation: Medial  Staging: Stage 2 -  Partial thickness loss of dermis presenting as a shallow open injury with a red, pink wound bed without slough.  Wound Description (Comments):   Present on Admission: Yes           Subjective:  Patient feels better, appetite improving.  No diarrhea.  No shortness of breath.  Physical Exam: Vitals:   07/19/23 2035 07/20/23 0039 07/20/23 0407 07/20/23 0851  BP: 95/64 104/69 119/64 97/60  Pulse: 99 96 (!) 103 91  Resp: 18 18 18 18   Temp: 97.9 F (36.6 C) 98.9 F (37.2 C) (!) 97.5 F (36.4 C) 97.8 F (36.6 C)  TempSrc:  Oral    SpO2: 97% 98% 96% 98%  Weight:      Height:       General exam: Appears calm and comfortable  Respiratory system: Clear to auscultation. Respiratory effort normal. Cardiovascular system: S1 & S2 heard, RRR. No JVD, murmurs, rubs, gallops or clicks. No pedal edema. Gastrointestinal system: Abdomen is nondistended, soft and nontender. No organomegaly or masses felt. Normal bowel sounds heard. Central nervous system: Alert and oriented x2. No focal neurological deficits. Extremities: Symmetric 5 x 5 power. Skin: No rashes, lesions or ulcers Psychiatry: Mood & affect appropriate.    Data Reviewed:  Results reviewed  Family Communication: Daughter updated at bedside.  Disposition: Status is: Inpatient Remains inpatient appropriate because: Unsafe discharge option, pending nursing home placement.     Time spent: 35 minutes  Author: Donaciano Frizzle, MD 07/20/2023 1:18 PM  For on call review [email or web address].

## 2023-07-21 DIAGNOSIS — R6521 Severe sepsis with septic shock: Secondary | ICD-10-CM | POA: Diagnosis not present

## 2023-07-21 DIAGNOSIS — R630 Anorexia: Secondary | ICD-10-CM | POA: Diagnosis not present

## 2023-07-21 DIAGNOSIS — N171 Acute kidney failure with acute cortical necrosis: Secondary | ICD-10-CM | POA: Diagnosis not present

## 2023-07-21 DIAGNOSIS — A419 Sepsis, unspecified organism: Secondary | ICD-10-CM | POA: Diagnosis not present

## 2023-07-21 LAB — BASIC METABOLIC PANEL WITH GFR
Anion gap: 8 (ref 5–15)
BUN: 56 mg/dL — ABNORMAL HIGH (ref 8–23)
CO2: 24 mmol/L (ref 22–32)
Calcium: 8.9 mg/dL (ref 8.9–10.3)
Chloride: 107 mmol/L (ref 98–111)
Creatinine, Ser: 1.68 mg/dL — ABNORMAL HIGH (ref 0.61–1.24)
GFR, Estimated: 41 mL/min — ABNORMAL LOW (ref >60)
Glucose, Bld: 114 mg/dL — ABNORMAL HIGH (ref 70–99)
Potassium: 5.8 mmol/L — ABNORMAL HIGH (ref 3.5–5.1)
Sodium: 139 mmol/L (ref 135–145)

## 2023-07-21 LAB — POTASSIUM
Potassium: 5.6 mmol/L — ABNORMAL HIGH (ref 3.5–5.1)
Potassium: 5.4 mmol/L — ABNORMAL HIGH (ref 3.5–5.1)

## 2023-07-21 MED ORDER — SODIUM ZIRCONIUM CYCLOSILICATE 10 G PO PACK
10.0000 g | PACK | Freq: Once | ORAL | Status: AC
  Administered 2023-07-21: 10 g via ORAL
  Filled 2023-07-21: qty 1

## 2023-07-21 MED ORDER — SODIUM BICARBONATE 8.4 % IV SOLN
50.0000 meq | Freq: Once | INTRAVENOUS | Status: AC
  Administered 2023-07-21: 50 meq via INTRAVENOUS
  Filled 2023-07-21: qty 50

## 2023-07-21 MED ORDER — FUROSEMIDE 40 MG PO TABS
40.0000 mg | ORAL_TABLET | Freq: Two times a day (BID) | ORAL | Status: DC
  Administered 2023-07-21 (×2): 40 mg via ORAL
  Filled 2023-07-21 (×2): qty 1

## 2023-07-21 NOTE — Plan of Care (Signed)

## 2023-07-21 NOTE — TOC Progression Note (Addendum)
 Transition of Care (TOC) - Progression Note    Patient Details  Name: Ethan Vega. MRN: [id number] Date of Birth: [birth year]  Transition of Care [facility]) CM/SW Contact  Crayton Docker, RN Phone Number: 07/21/2023, 11:32 AM  Clinical Narrative:     Alert received from Dr. Jeane Miguel regarding discharge care planning. Hospital day 11 with diagnosis of Septic shock. Per Dr. Jeane Miguel, patient is medically ready for discharge to SNF. [facility] is accepting SNF. CM call to patient's daughter, Kennyth Pearson, phone: [phone] regarding accepting SNF. Patient's daughter,is amenable to [facility]. Per patient's daughter, Barrese, requests a Medicaid application assistance and discussion with financial counselor regarding possible long term care placement. CM call to Tammy, Admissions, [facility]. Per Tammy, SNF is extending bed offer. Patient requires auth for SNF placement. CM alert to Stan Eans, CMA regarding initiating auth for SNF.   Alert received from Stan Eans, CMA, [name] is pending, [facility] [id number]#[id number] .  Alert received from Stan Eans, [place], SNF [name] is "Mr Tenerelli is approved 5/5 - 5/7 [facility] QM#[id number]".  Expected Discharge Plan: Home/Self Care Barriers to Discharge: Continued Medical Work up  Expected Discharge Plan and Services   Discharge Planning Services: CM Consult   Living arrangements for the past 2 months: Single Family Home (4 steps , handrails x 1)                   Social Determinants of Health (SDOH) Interventions SDOH Screenings   Food Insecurity: Patient Unable To Answer (07/10/2023)  Housing: Patient Unable To Answer (07/10/2023)  Transportation Needs: Patient Unable To Answer (07/10/2023)  Utilities: Patient Unable To Answer (07/10/2023)  Depression (PHQ2-9): Low Risk  (01/15/2023)  Social Connections: Patient Unable To Answer (07/10/2023)  Tobacco Use: Low Risk  (07/10/2023)    Readmission Risk Interventions     No data to display

## 2023-07-21 NOTE — Progress Notes (Signed)
 Physical Therapy Treatment Patient Details Name: Ethan Vega. MRN: [id number] DOB: 15-May-1945 Today's Date: 07/21/2023   History of Present Illness Patient is a 78 year old male with renal failure, toxic metabolic encephalopathy, septic shock with LE cellulitis. Required CRRT and pressor support. History of HTN, atrial fibrillation, ventricular tachycardia, AICD placement, dementia.    PT Comments  PT asleep on entry, but awakes easily with gentle touch. Pt agreeable to PT session, motivated, pleasant. We start with some AA/ROM of legs to work out some stiffness, then initiate bed mobility. Pt continues to remain very weak in general, requires maxA or more for mobility. Before transition to EOB is completed, session changes gears to address loose stool cleanup in bed, author assists for several minutes, but ultimately, NSG arrives with +2, as cleanup will require several minutes and linen changes. Session ended early to allow for patient care. Will continue to follow.    If plan is discharge home, recommend the following: A lot of help with walking and/or transfers;A lot of help with bathing/dressing/bathroom;Assistance with cooking/housework;Assistance with feeding;Direct supervision/assist for medications management;Direct supervision/assist for financial management;Assist for transportation;Help with stairs or ramp for entrance;Supervision due to cognitive status   Can travel by private vehicle     No  Equipment Recommendations  None recommended by PT    Recommendations for Other Services       Precautions / Restrictions Precautions Precautions: Fall Recall of Precautions/Restrictions: Impaired Restrictions Weight Bearing Restrictions Per Provider Order: No     Mobility  Bed Mobility   Bed Mobility: Supine to Sit, Sit to Supine (started transition after leg ROM, but then noted loose BM prior to completion, reversed process and returned to supine)     Supine to sit: Max  assist, HOB elevated, Used rails, +2 for physical assistance Sit to supine: Max assist, HOB elevated, Used rails, +2 for physical assistance        Transfers                        Ambulation/Gait                   Stairs             Wheelchair Mobility     Tilt Bed    Modified Rankin (Stroke Patients Only)       Balance                                            Communication    Cognition Arousal: Alert Behavior During Therapy: WFL for tasks assessed/performed   PT - Cognitive impairments: History of cognitive impairments, No apparent impairments                       PT - Cognition Comments: Pt with hx of dementia, oriented to time, place, self but not situation, fearful of falling        Cueing    Exercises Other Exercises Other Exercises: Heel slides AA/ROM 1x15 bilat Other Exercises: Assist with rolling    General Comments        Pertinent Vitals/Pain Pain Assessment Pain Assessment: No/denies pain    Home Living                          Prior Function  PT Goals (current goals can now be found in the care plan section) Acute Rehab PT Goals Patient Stated Goal: regain strength for basic mobility PT Goal Formulation: With patient Time For Goal Achievement: 07/30/23 Potential to Achieve Goals: Fair Progress towards PT goals: PT to reassess next treatment    Frequency    Min 2X/week      PT Plan      Co-evaluation              AM-PAC PT "6 Clicks" Mobility   Outcome Measure  Help needed turning from your back to your side while in a flat bed without using bedrails?: Total Help needed moving from lying on your back to sitting on the side of a flat bed without using bedrails?: Total Help needed moving to and from a bed to a chair (including a wheelchair)?: Total Help needed standing up from a chair using your arms (e.g., wheelchair or bedside chair)?:  Total Help needed to walk in hospital room?: Total Help needed climbing 3-5 steps with a railing? : Total 6 Click Score: 6    End of Session   Activity Tolerance: Other (comment) (session ended early to make time for pericare bowel cleanup) Patient left: in bed;with call bell/phone within reach;with nursing/sitter in room Nurse Communication: Mobility status PT Visit Diagnosis: Unsteadiness on feet (R26.81);Muscle weakness (generalized) (M62.81);Other abnormalities of gait and mobility (R26.89);Difficulty in walking, not elsewhere classified (R26.2)     Time: 1610-9604 PT Time Calculation (min) (ACUTE ONLY): 14 min  Charges:    $Therapeutic Activity: 8-22 mins PT General Charges $$ ACUTE PT VISIT: 1 Visit                    4:20 PM, 07/21/23 Dawn Eth, PT, DPT Physical Therapist - [facility]  [phone] (ASCOM)    Almon Whitford C 07/21/2023, 4:17 PM

## 2023-07-21 NOTE — Progress Notes (Signed)
 [facility]  ROUNDING NOTE   Subjective:   Patient seen sitting up in bed Alert and oriented States he feels well today Less fatigue and weakness  05/04 0701 - 05/05 0700 In: 240 [P.O.:240] Out: 1000 [Urine:1000] Lab Results  Component Value Date   CREATININE 1.68 (H) 07/21/2023   CREATININE 1.78 (H) 07/20/2023   CREATININE 1.80 (H) 07/19/2023     Objective:  Vital signs in last 24 hours:  Temp:  [97.6 F (36.4 C)-98.5 F (36.9 C)] 98.5 F (36.9 C) (05/05 0904) Pulse Rate:  [92-110] 110 (05/05 0904) Resp:  [17-18] 18 (05/05 0904) BP: (104-123)/(65-89) 123/89 (05/05 0904) SpO2:  [98 %] 98 % (05/05 0904) Weight:  [88.1 kg] 88.1 kg (05/05 0500)  Weight change:  Filed Weights   07/17/23 0500 07/18/23 0327 07/21/23 0500  Weight: 87.2 kg 87.4 kg 88.1 kg    Intake/Output: I/O last 3 completed shifts: In: 240 [P.O.:240] Out: 1000 [Urine:1000]   Intake/Output this shift:  Total I/O In: -  Out: 400 [Urine:400]  Physical Exam: General: NAD  Head: Normocephalic, atraumatic. Dry oral mucosal membranes  Eyes: Anicteric  Lungs:  Normal effort  Heart: Regular rate and rhythm  Abdomen:  Soft, nontender  Extremities: trace peripheral edema.  Neurologic: Awake, alert, conversant  Skin: No lesions  Access: None    Basic Metabolic Panel: Recent Labs  Lab 07/14/23 1527 07/15/23 0509 07/15/23 1640 07/16/23 0326 07/17/23 0421 07/18/23 0313 07/19/23 0448 07/20/23 0419 07/21/23 0548  NA 136 133* 138 137 136 132* 135 136 139  K 3.6 4.1 4.2 4.1 4.6 4.4 4.9 5.1 5.8*  CL 106 102 102 104 106 104 103 105 107  CO2 24 23 24 23 23  21* 22 23 24   GLUCOSE 127* 92 96 96 97 111* 96 115* 114*  BUN 13 11 13 20  30* 38* 38* 47* 56*  CREATININE 1.15 1.10 1.02 1.48* 1.96* 1.97* 1.80* 1.78* 1.68*  CALCIUM  8.1* 7.3* 7.9* 8.2* 8.4* 8.4* 8.9 8.9 8.9  MG  --  2.6*  --  2.6* 2.4  --  2.2  --   --   PHOS 3.0 1.6* 3.9 4.4  --   --   --   --   --     Liver Function  Tests: Recent Labs  Lab 07/14/23 1527 07/15/23 0509 07/15/23 1640 07/16/23 0326 07/17/23 0421  AST  --   --   --   --  31  ALT  --   --   --   --  39  ALKPHOS  --   --   --   --  41  BILITOT  --   --   --   --  1.5*  PROT  --   --   --   --  4.8*  ALBUMIN 2.3* 2.5* 2.4* 2.3* 2.3*   No results for input(s): "LIPASE", "AMYLASE" in the last 168 hours.  No results for input(s): "AMMONIA" in the last 168 hours.  CBC: Recent Labs  Lab 07/15/23 0509 07/16/23 0326 07/17/23 0421  WBC 13.6* 11.0* 10.5  HGB 12.6* 12.1* 12.5*  HCT 37.0* 36.1* 37.0*  MCV 94.4 96.8 96.6  PLT 175 144* 153    Cardiac Enzymes: No results for input(s): "CKTOTAL", "CKMB", "CKMBINDEX", "TROPONINI" in the last 168 hours.  BNP: Invalid input(s): "POCBNP"  CBG: Recent Labs  Lab 07/15/23 1707  GLUCAP 98    Microbiology: Results for orders placed or performed during the hospital encounter of 07/10/23  Resp panel by RT-PCR (RSV, Flu A&B, Covid) Anterior Nasal Swab     Status: None   Collection Time: 07/10/23  8:50 AM   Specimen: Anterior Nasal Swab  Result Value Ref Range Status   SARS Coronavirus 2 by RT PCR NEGATIVE NEGATIVE Final    Comment: (NOTE) SARS-CoV-2 target nucleic acids are NOT DETECTED.  The SARS-CoV-2 RNA is generally detectable in upper respiratory specimens during the acute phase of infection. The lowest concentration of SARS-CoV-2 viral copies this assay can detect is 138 copies/mL. A negative result does not preclude SARS-Cov-2 infection and should not be used as the sole basis for treatment or other patient management decisions. A negative result may occur with  improper specimen collection/handling, submission of specimen other than nasopharyngeal swab, presence of viral mutation(s) within the areas targeted by this assay, and inadequate number of viral copies(<138 copies/mL). A negative result must be combined with clinical observations, patient history, and  epidemiological information. The expected result is Negative.  Fact Sheet for Patients:  BloggerCourse.com  Fact Sheet for Healthcare Providers:  SeriousBroker.it  This test is no t yet approved or cleared by the [place]  FDA and  has been authorized for detection and/or diagnosis of SARS-CoV-2 by FDA under an Emergency Use Authorization (EUA). This EUA will remain  in effect (meaning this test can be used) for the duration of the COVID-19 declaration under Section 564(b)(1) of the Act, 21 U.S.C.section 360bbb-3(b)(1), unless the authorization is terminated  or revoked sooner.       Influenza A by PCR NEGATIVE NEGATIVE Final   Influenza B by PCR NEGATIVE NEGATIVE Final    Comment: (NOTE) The Xpert Xpress SARS-CoV-2/FLU/RSV plus assay is intended as an aid in the diagnosis of influenza from Nasopharyngeal swab specimens and should not be used as a sole basis for treatment. Nasal washings and aspirates are unacceptable for Xpert Xpress SARS-CoV-2/FLU/RSV testing.  Fact Sheet for Patients: BloggerCourse.com  Fact Sheet for Healthcare Providers: SeriousBroker.it  This test is not yet approved or cleared by the [place]  FDA and has been authorized for detection and/or diagnosis of SARS-CoV-2 by FDA under an Emergency Use Authorization (EUA). This EUA will remain in effect (meaning this test can be used) for the duration of the COVID-19 declaration under Section 564(b)(1) of the Act, 21 U.S.C. section 360bbb-3(b)(1), unless the authorization is terminated or revoked.     Resp Syncytial Virus by PCR NEGATIVE NEGATIVE Final    Comment: (NOTE) Fact Sheet for Patients: BloggerCourse.com  Fact Sheet for Healthcare Providers: SeriousBroker.it  This test is not yet approved or cleared by the [place]  FDA and has been  authorized for detection and/or diagnosis of SARS-CoV-2 by FDA under an Emergency Use Authorization (EUA). This EUA will remain in effect (meaning this test can be used) for the duration of the COVID-19 declaration under Section 564(b)(1) of the Act, 21 U.S.C. section 360bbb-3(b)(1), unless the authorization is terminated or revoked.  Performed at [facility], [address] [place], [place] [address]   Blood Culture (routine x 2)     Status: None   Collection Time: 07/10/23  8:50 AM   Specimen: BLOOD  Result Value Ref Range Status   Specimen Description BLOOD RIGHT [facility]  Final   Special Requests   Final    BOTTLES DRAWN AEROBIC AND ANAEROBIC Blood Culture results may not be optimal due to an inadequate volume of blood received in culture bottles   Culture   Final  NO GROWTH 5 DAYS Performed at [facility], [address] [place], [place] [address]    Report Status 07/15/2023 FINAL  Final  Blood Culture (routine x 2)     Status: None   Collection Time: 07/10/23  8:05 PM   Specimen: BLOOD RIGHT HAND  Result Value Ref Range Status   Specimen Description BLOOD RIGHT HAND  Final   Special Requests   Final    BOTTLES DRAWN AEROBIC AND ANAEROBIC Blood Culture results may not be optimal due to an inadequate volume of blood received in culture bottles   Culture   Final    NO GROWTH 5 DAYS Performed at [facility], [address], [place], [place] [address]    Report Status 07/15/2023 FINAL  Final  MRSA Next Gen by PCR, Nasal     Status: None   Collection Time: 07/10/23  8:35 PM   Specimen: Nasal Mucosa; Nasal Swab  Result Value Ref Range Status   MRSA by PCR Next Gen NOT DETECTED NOT DETECTED Final    Comment: (NOTE) The GeneXpert MRSA Assay (FDA approved for NASAL specimens only), is one component of a comprehensive MRSA colonization surveillance program. It is not intended to diagnose MRSA infection nor to guide or monitor treatment for MRSA  infections. Test performance is not FDA approved in patients less than 44 years old. Performed at [facility], [address] [place], [place] [address]     Coagulation Studies: No results for input(s): "LABPROT", "INR" in the last 72 hours.   Urinalysis: No results for input(s): "COLORURINE", "LABSPEC", "PHURINE", "GLUCOSEU", "HGBUR", "BILIRUBINUR", "KETONESUR", "PROTEINUR", "UROBILINOGEN", "NITRITE", "LEUKOCYTESUR" in the last 72 hours.  Invalid input(s): "APPERANCEUR"     Imaging: No results found.     Medications:      apixaban   2.5 mg Oral BID   vitamin C   500 mg Oral BID   Chlorhexidine  Gluconate Cloth  6 each Topical Q0600   collagenase    Topical Daily   feeding supplement  237 mL Oral TID BM   furosemide   40 mg Oral BID   levothyroxine   75 mcg Oral Q0600   lidocaine   1 patch Transdermal Q24H   lipase/protease/amylase  24,000 Units Oral TID AC   liver oil-zinc  oxide   Topical BID   megestrol   400 mg Oral Daily   multivitamin  1 tablet Oral QHS   pantoprazole   40 mg Oral Daily   pneumococcal 20-valent conjugate vaccine  0.5 mL Intramuscular Tomorrow-1000   predniSONE   5 mg Oral Q breakfast   QUEtiapine   25 mg Oral QHS   acetaminophen , docusate sodium , mouth rinse  Assessment/ Plan:  Mr. Ethan Pile. is a 78 y.o.  male  with a PMHx of hypertension, nonischemic cardiomyopathy, paroxysmal atrial fibrillation, history of ventricular tachycardia, hyperlipidemia, history of AICD placement, history of skin cancer, dementia, who was admitted to [facility] on 07/10/2023 for Cellulitis of left foot [L03.116] Septic shock (HCC) [A41.9, R65.21] Sepsis with acute renal failure and septic shock, due to unspecified organism, unspecified acute renal failure type (HCC) [A41.9, R65.21, N17.9] Acute renal failure superimposed on chronic kidney disease, unspecified acute renal failure type, unspecified CKD stage (HCC) [N17.9, N18.9]   Acute kidney injury/chronic  kidney disease stage IIIb baseline EGFR 39 on 06/26/23/hyperkalemia.  Patient now with acute kidney injury secondary to ATN most likely.  Patient has had rather poor p.o. intake at home over the past several days prior to admission according to the daughter.  CT scan abdomen pelvis negative for obstruction.  Given severity of acute kidney injury recommend initiation of continuous renal placement therapy.  Appreciate critical care placing right femoral temporary dialysis catheter.  CRRT stopped on 4/29. Creatinine remains stable. No further indication for dialysis at this time. Will continue to follow renal indices and patient will need follow up in our office at discharge.   05/04 0701 - 05/05 0700 In: 240 [P.O.:240] Out: 1000 [Urine:1000] Lab Results  Component Value Date   CREATININE 1.68 (H) 07/21/2023   CREATININE 1.78 (H) 07/20/2023   CREATININE 1.80 (H) 07/19/2023      2.  Acute metabolic acidosis.  Corrected   3.  Hypotension.  Required pressors earlier in admission. Blood pressure acceptable at this time.     LOS: 11 Lynnex Fulp 5/5/202511:52 AM

## 2023-07-21 NOTE — Progress Notes (Signed)
 Progress Note   Patient: Ethan Vega. RUE:[id number] DOB: 31-Jul-1945 DOA: 07/10/2023     11 DOS: the patient was seen and examined on 07/21/2023   Brief hospital course: Ethan Vega is a 78 year old male with a history of essential hypertension, nonischemic cardiomyopathy history of paroxysmal A-fib and ventricular tachycardia, dyslipidemia who was seen earlier today with electrophysiology service to have ICD generator change however he was noted to have open sores on his left lower extremity and was mentating with confusion and encephalopathy with decision to abort procedure and reschedule. He was subsequently sent to the ER. His workup revealed severe AKI and leukocytosis and vitals with shock physiology. Patient received IV fluid resuscitation but despite 3 L of intravenous fluids continues to require Levophed  support. He reports diarrhea over the past week. Patient currently lives alone in his home and was previously able to perform all activities of daily living. He is unaware of the open score and cellulitic lesion of his left foot.    Principal Problem:   Septic shock (HCC) Active Problems:   NSVT (nonsustained ventricular tachycardia) (HCC)   Acquired hypothyroidism   Acute renal failure superimposed on chronic kidney disease (HCC)   Cellulitis of left foot   Pressure injury of skin   Toxic metabolic encephalopathy   Class 1 obesity   Failure to thrive in adult   Anorexia   Adrenal insufficiency (HCC)   Hyponatremia   Assessment and Plan: Septic shock secondary to left foot infection. Left foot ulcer with cellulitis. Elevated troponin secondary to septic shock. Adrenal insufficiency. Patient had received IV fluid bolus, he also received Levophed .  Currently off Levophed , blood pressure is borderline low, added midodrine .  Also check cortisol level. Blood Culture has no growth. Patient was treated with vancomycin , completed dose.  Then on Zosyn . CT scan of the foot did  not show any bony involvement. He was seen by podiatry, no need for debridement.  I will continue oral antibiotics with Augmentin  for additional 3 days to complete 10-day course.  Condition seems to be improved. Patient low blood pressure was also associated with adrenal insufficiency, cortisol level in the morning was only 3.5, started prednisone  5 mg daily. Patient condition has improved, midodrine  will be discontinued today.  Antibiotics completed.   Paroxysmal atrial fibrillation. Nonsustained ventricular tachycardia. Nonischemic cardiomyopathy with chronic systolic congestive heart failure with EF 30-35%. Appears to be euvolemic.  Discontinued heparin  drip, placed on Eliquis . Patient has had another episode of 7 beats ventricular tachycardia 5/2, continue to follow. Patient does not feel short of breath, but started have crackles in the base.  Restart oral Lasix .   Failure to thrive. Anorexia. Chronic diarrhea. 5/1.Patient has very poor appetite, currently eating 25% of meals.  No longer has diarrhea on Creon . Also started Protonix , megestrol . Patient condition finally improving, appetite is better.  No need for feeding tube.   Acute kidney injury chronic kidney disease stage IIIa. Hyponatremia. Hyperkalemia Followed by nephrology, briefly needing dialysis.  Renal function had improved.   Patient had very poor p.o. intake, renal function is getting worse again, most likely distal secondary to dehydration.  Renal function improved again after gentle rehydration and continue to improve. However, patient developed hyperkalemia today with potassium 5.6.  Will give Lokelma, also give IV sodium bicarb.  Repeat potassium level later today.   Acute metabolic encephalopathy. Chronic dementia. Condition appears to be stable.   Debility. PT/OT recommended nursing home placement.   Class 1 obesity. BMI 32.77 Patient currently  has a very poor appetite.   Pressure ulcer POA Pressure  Injury 07/11/23 Buttocks Left Stage 2 -  Partial thickness loss of dermis presenting as a shallow open injury with a red, pink wound bed without slough. (Active)  07/11/23 1049  Location: Buttocks  Location Orientation: Left  Staging: Stage 2 -  Partial thickness loss of dermis presenting as a shallow open injury with a red, pink wound bed without slough.  Wound Description (Comments):   Present on Admission: Yes     Pressure Injury 07/11/23 Buttocks Medial Stage 2 -  Partial thickness loss of dermis presenting as a shallow open injury with a red, pink wound bed without slough. (Active)  07/11/23 1100  Location: Buttocks  Location Orientation: Medial  Staging: Stage 2 -  Partial thickness loss of dermis presenting as a shallow open injury with a red, pink wound bed without slough.  Wound Description (Comments):   Present on Admission: Yes         Subjective:  Patient doing well, appetite has improved.  No short of breath.  Physical Exam: Vitals:   07/20/23 2055 07/21/23 0500 07/21/23 0526 07/21/23 0904  BP: 110/69  104/66 123/89  Pulse: (!) 102  95 (!) 110  Resp:   17 18  Temp: 97.8 F (36.6 C)  97.6 F (36.4 C) 98.5 F (36.9 C)  TempSrc:   Oral   SpO2: 98%  98% 98%  Weight:  88.1 kg    Height:       General exam: Appears calm and comfortable  Respiratory system: Crackles in the bases. Respiratory effort normal. Cardiovascular system: S1 & S2 heard, RRR. No JVD, murmurs, rubs, gallops or clicks. No pedal edema. Gastrointestinal system: Abdomen is nondistended, soft and nontender. No organomegaly or masses felt. Normal bowel sounds heard. Central nervous system: Alert and oriented x2. No focal neurological deficits. Extremities: Symmetric 5 x 5 power. Skin: No rashes, lesions or ulcers Psychiatry:  Mood & affect appropriate.    Data Reviewed:  Lab results reviewed.  Family Communication: Daughter updated over the phone.  Disposition: Status is:  Inpatient Remains inpatient appropriate because: Severity of disease.      Time spent: 35 minutes  Author: Donaciano Frizzle, MD 07/21/2023 1:13 PM  For on call review [email or web address].

## 2023-07-21 NOTE — Progress Notes (Signed)
 Pt's daughter bedside requesting to speak to Kayleen Party with "finance" but I have no number for her. When CM arrives tomorrow, could they please reach out to daughter to give her the number for Kayleen Party please.

## 2023-07-21 NOTE — TOC Transition Note (Addendum)
 Transition of Care [facility]) - Discharge Note   Patient Details  Name: Ethan Vega. MRN: [id number] Date of Birth: July 06, 1945  [facility] of [facility] CM/SW Contact:  Crayton Docker, RN 07/21/2023, 12:20 PM   Clinical Narrative:     Patient to discharge to [facility] today via Geographical information systems officer at 1500. CM follow up call placed to Tammy, Admissions, [facility] regarding approved auth for SNF placement and coordination of transfer. Per Tammy, patient can admit at 1500 to SNF. CM to patient's room regarding pending discharge. Patient verbalized understanding and agreement. CM call to patient's daughter, Morgret, regarding pending discharge. Patient's daughter verbalized understanding and agreement.   Alert received from Dr. Jeane Miguel, patient will not discharge today due to hyperkalemia. CM call to [employer], CM spoke to Ed, [employer] transport cancelled. CM follow up call placed to patient's daughter, Hensey and informed of cancelled discharge. CM follow up call placed to Tammy, Admissions, [facility] regarding cancelled discharge. Per Tammy, verbalized understanding.   Final next level of care: Skilled Nursing Facility Barriers to Discharge: No Barriers Identified   Patient Goals and CMS Choice    SNF    Discharge Placement               SNF   Discharge Plan and Services Additional resources added to the After Visit Summary for    SNF  Discharge Planning Services: CM Consult              Social Drivers of Health (SDOH) Interventions SDOH Screenings   Food Insecurity: Patient Unable To Answer (07/10/2023)  Housing: Patient Unable To Answer (07/10/2023)  Transportation Needs: Patient Unable To Answer (07/10/2023)  Utilities: Patient Unable To Answer (07/10/2023)  Depression (PHQ2-9): Low Risk  (01/15/2023)  Social Connections: Patient Unable To Answer (07/10/2023)  Tobacco Use: Low Risk  (07/10/2023)     Readmission Risk  Interventions     No data to display

## 2023-07-22 DIAGNOSIS — L03116 Cellulitis of left lower limb: Secondary | ICD-10-CM | POA: Diagnosis not present

## 2023-07-22 DIAGNOSIS — K921 Melena: Secondary | ICD-10-CM | POA: Diagnosis not present

## 2023-07-22 DIAGNOSIS — N171 Acute kidney failure with acute cortical necrosis: Secondary | ICD-10-CM | POA: Diagnosis not present

## 2023-07-22 DIAGNOSIS — N179 Acute kidney failure, unspecified: Secondary | ICD-10-CM | POA: Diagnosis not present

## 2023-07-22 DIAGNOSIS — R627 Adult failure to thrive: Secondary | ICD-10-CM | POA: Diagnosis not present

## 2023-07-22 DIAGNOSIS — A419 Sepsis, unspecified organism: Secondary | ICD-10-CM | POA: Diagnosis not present

## 2023-07-22 LAB — BASIC METABOLIC PANEL WITH GFR
Anion gap: 8 (ref 5–15)
BUN: 61 mg/dL — ABNORMAL HIGH (ref 8–23)
CO2: 26 mmol/L (ref 22–32)
Calcium: 8.9 mg/dL (ref 8.9–10.3)
Chloride: 102 mmol/L (ref 98–111)
Creatinine, Ser: 1.78 mg/dL — ABNORMAL HIGH (ref 0.61–1.24)
GFR, Estimated: 38 mL/min — ABNORMAL LOW (ref >60)
Glucose, Bld: 112 mg/dL — ABNORMAL HIGH (ref 70–99)
Potassium: 5.6 mmol/L — ABNORMAL HIGH (ref 3.5–5.1)
Sodium: 136 mmol/L (ref 135–145)

## 2023-07-22 LAB — HEMOGLOBIN: Hemoglobin: 11.8 g/dL — ABNORMAL LOW (ref 13.0–17.0)

## 2023-07-22 LAB — POTASSIUM: Potassium: 4.6 mmol/L (ref 3.5–5.1)

## 2023-07-22 MED ORDER — FUROSEMIDE 20 MG PO TABS: 20.0000 mg | ORAL_TABLET | Freq: Every day | ORAL | Status: DC

## 2023-07-22 MED ORDER — ADULT MULTIVITAMIN W/MINERALS CH
1.0000 | ORAL_TABLET | Freq: Every day | ORAL | Status: DC
  Administered 2023-07-22 – 2023-08-01 (×10): 1 via ORAL
  Filled 2023-07-22 (×10): qty 1

## 2023-07-22 MED ORDER — PATIROMER SORBITEX CALCIUM 8.4 G PO PACK
16.8000 g | PACK | Freq: Once | ORAL | Status: DC
  Filled 2023-07-22: qty 2

## 2023-07-22 MED ORDER — PROTHROMBIN COMPLEX CONC HUMAN 500 UNITS IV KIT
4272.0000 [IU] | PACK | Status: AC
  Administered 2023-07-22: 4272 [IU] via INTRAVENOUS
  Filled 2023-07-22: qty 4272

## 2023-07-22 MED ORDER — SODIUM CHLORIDE 0.9 % IV SOLN: INTRAVENOUS | Status: AC

## 2023-07-22 MED ORDER — PANTOPRAZOLE SODIUM 40 MG IV SOLR
40.0000 mg | Freq: Two times a day (BID) | INTRAVENOUS | Status: DC
  Administered 2023-07-22 – 2023-07-28 (×13): 40 mg via INTRAVENOUS
  Filled 2023-07-22 (×15): qty 10

## 2023-07-22 MED ORDER — PATIROMER SORBITEX CALCIUM 8.4 G PO PACK
25.2000 g | PACK | Freq: Once | ORAL | Status: AC
  Administered 2023-07-22: 25.2 g via ORAL
  Filled 2023-07-22: qty 3

## 2023-07-22 MED ORDER — SODIUM ZIRCONIUM CYCLOSILICATE 10 G PO PACK: 10.0000 g | PACK | Freq: Once | ORAL | Status: DC

## 2023-07-22 MED ORDER — MIDODRINE HCL 5 MG PO TABS
2.5000 mg | ORAL_TABLET | Freq: Three times a day (TID) | ORAL | Status: DC
  Administered 2023-07-22 – 2023-07-23 (×5): 2.5 mg via ORAL
  Filled 2023-07-22 (×6): qty 1

## 2023-07-22 NOTE — Progress Notes (Signed)
 Palliative Care Progress Note, Assessment & Plan   Patient Name: Ethan Vega.       Date: 07/22/2023 DOB: 05/11/1945  Age: 78 y.o. MRN#: [id number] Attending Physician: Ethan Frizzle, MD Primary Care Physician: Ethan Daughters, MD Admit Date: 07/10/2023  Subjective: Patient is sitting up in bed, asleep, but easily awakens to my presence.  When awake, he is alert and oriented to person, place, and situation.  No family or friends present during my visit.  HPI: 77 y.o. male  with past medical history of  essential hypertension, nonischemic cardiomyopathy history of paroxysmal A-fib and ventricular tachycardia, dyslipidemia. He presented to hospital 07/10/23 to have ICD generator changed. Staff found him to have open wounds to LLE, confusion and hypotension. He was subsequently sent to ED for evaluation. ED workup found patient to have AKI, leukocytosis and hypotension with creatinine 5.5 (baseline 1.7), Na+ 134, K+ 5.4, BUN 151, BNP 672.1, Trop 164, Lactic 2.2 and WBC 16.1.   He received IV fluid resuscitation but required pressor support. Daughter at bedside reported that patient lives alone, performing his ADLs independently. She adds that he complained of diarrhea and poor PO intake 2 weeks prior. Pt was unaware of the open sore to his LLE but did confirm chronic lower extremity pitting edema. He was admitted to ICU for circulatory shock with AKI.    Palliative care was consulted to discuss goals of care.  PMT had step back from daily visits and was monitoring patient peripherally.  However, patient's daughter requested a return phone call from PMT this morning.  Summary of counseling/coordination of care: Extensive chart review completed prior to meeting patient including labs, vital signs, imaging,  progress notes, orders, and available advanced directive documents from current and previous encounters.   After reviewing the patient's chart and assessing the patient at bedside, I spoke with patient in regards to symptom management and goals of care.   Symptoms assessed.  Patient has no acute complaints.  He denies pain, discomfort, headache, chest pain, or other acute ailments.  No adjustment to [facility] needed.  Attempted to gauge patient's understanding of his current medical situation.  He shares that he believes he will be leaving today.  Discussed that patient is hyperkalemic (K5.6).  Education provided on Lokelma, hypercalemia. He shares he took medication to lower his potassium and does not understand why it is still so high.  Reviewed that this has halted his discharge and that he will be monitored further until potassium is corrected.  Attempted to ellicit patient's goals/values. He shares she is ready to get out of the hospital but does not want to leave too soon.  We discussed that his daughter Ethan Vega remains worried about him and has requested a phone call from palliative medicine team for support.  After visiting with the patient, spoke with Ethan Vega over the phone.  She shares concern that she has not heard about financial counseling.  She shares that she has requested to work with TOC last Friday/over the weekend but has not received a return email or phone call.  I secure messaged with TOC Ethan Vega in regards to [name]'s wishes/concerns. She plans to speak with Ethan Vega directly to clarify  her concerns.   Goals remain clear. PMT will continue to follow and support.   Physical Exam Vitals reviewed.  Constitutional:      General: He is not in acute distress.    Appearance: He is obese. He is not ill-appearing.  HENT:     Nose: Nose normal.     Mouth/Throat:     Mouth: Mucous membranes are moist.  Eyes:     Pupils: Pupils are equal, round, and reactive to light.  Pulmonary:      Effort: Pulmonary effort is normal.  Abdominal:     Palpations: Abdomen is soft.  Skin:    General: Skin is warm and dry.     Coloration: Skin is pale.  Neurological:     Mental Status: He is alert.  Psychiatric:        Mood and Affect: Mood normal.        Behavior: Behavior normal.        Thought Content: Thought content normal.        Judgment: Judgment normal.             Total Time 35 minutes   Time spent includes: Detailed review of medical records (labs, imaging, vital signs), medically appropriate exam (mental status, respiratory, cardiac, skin), discussed with treatment team, counseling and educating patient, family and staff, documenting clinical information, medication management and coordination of care.  Ethan Nose L. Rebbeca Campi, DNP, FNP-BC Palliative Medicine Team

## 2023-07-22 NOTE — Progress Notes (Signed)
 Spoke with pharmacy regarding Kcentra med, per pharmacist [facility], no special administration instructions for med, no consent needed to give med

## 2023-07-22 NOTE — Progress Notes (Signed)
 Progress Note   Patient: Ethan Vega. ZOX:[id number] DOB: 1945/12/30 DOA: 07/10/2023     12 DOS: the patient was seen and examined on 07/22/2023   Brief hospital course: Welsey Dia is a 78 year old male with a history of essential hypertension, nonischemic cardiomyopathy history of paroxysmal A-fib and ventricular tachycardia, dyslipidemia who was seen earlier today with electrophysiology service to have ICD generator change however he was noted to have open sores on his left lower extremity and was mentating with confusion and encephalopathy with decision to abort procedure and reschedule. He was subsequently sent to the ER. His workup revealed severe AKI and leukocytosis and vitals with shock physiology. Patient received IV fluid resuscitation but despite 3 L of intravenous fluids continues to require Levophed  support. He reports diarrhea over the past week. Patient currently lives alone in his home and was previously able to perform all activities of daily living. He is unaware of the open score and cellulitic lesion of his left foot.    Principal Problem:   Septic shock (HCC) Active Problems:   NSVT (nonsustained ventricular tachycardia) (HCC)   Acquired hypothyroidism   Acute renal failure superimposed on chronic kidney disease (HCC)   Cellulitis of left foot   Pressure injury of skin   Toxic metabolic encephalopathy   Class 1 obesity   Failure to thrive in adult   Anorexia   Adrenal insufficiency (HCC)   Hyponatremia   Assessment and Plan: Septic shock secondary to left foot infection. Left foot ulcer with cellulitis. Elevated troponin secondary to septic shock. Adrenal insufficiency. Patient had received IV fluid bolus, he also received Levophed .  Currently off Levophed , blood pressure is borderline low, added midodrine .  Also check cortisol level. Blood Culture has no growth. Patient was treated with vancomycin , completed dose.  Then on Zosyn . CT scan of the foot did  not show any bony involvement. He was seen by podiatry, no need for debridement.  I will continue oral antibiotics with Augmentin  for additional 3 days to complete 10-day course.  Condition seems to be improved. Patient low blood pressure was also associated with adrenal insufficiency, cortisol level in the morning was only 3.5, started prednisone  5 mg daily. I placed him 2.5 mg midodrine  again today, as blood pressure has been borderline today.   Paroxysmal atrial fibrillation. Nonsustained ventricular tachycardia. Nonischemic cardiomyopathy with chronic systolic congestive heart failure with EF 30-35%. Appears to be euvolemic.  Discontinued heparin  drip, placed on Eliquis . Patient has had another episode of 7 beats ventricular tachycardia 5/2, continue to follow. Received oral Lasix  intermittently due to crackles in the base.  Continue at the lower dose.   Failure to thrive. Anorexia. Chronic diarrhea. 5/1.Patient has very poor appetite, currently eating 25% of meals.  No longer has diarrhea on Creon . Also started Protonix , megestrol . Patient condition finally improving, appetite is better.    Acute kidney injury chronic kidney disease stage IIIa. Hyponatremia. Hyperkalemia Followed by nephrology, briefly needing dialysis.  Renal function had improved.   Patient had very poor p.o. intake, renal function is getting worse again, most likely distal secondary to dehydration.  Renal function improved again after gentle rehydration and continue to improve. However, patient developed hyperkalemia 5/4 with potassium 5.6.  Received Lokelma and IV sodium bicarb.  Potassium still high today, will try Veltassa.  Recheck BMP again tomorrow.  Acute metabolic encephalopathy. Chronic dementia. Condition appears to be stable.   Debility. PT/OT recommended nursing home placement.   Class 1 obesity. BMI 32.77 Patient currently  has a very poor appetite.   Pressure ulcer POA Pressure Injury  07/11/23 Buttocks Left Stage 2 -  Partial thickness loss of dermis presenting as a shallow open injury with a red, pink wound bed without slough. (Active)  07/11/23 1049  Location: Buttocks  Location Orientation: Left  Staging: Stage 2 -  Partial thickness loss of dermis presenting as a shallow open injury with a red, pink wound bed without slough.  Wound Description (Comments):   Present on Admission: Yes     Pressure Injury 07/11/23 Buttocks Medial Stage 2 -  Partial thickness loss of dermis presenting as a shallow open injury with a red, pink wound bed without slough. (Active)  07/11/23 1100  Location: Buttocks  Location Orientation: Medial  Staging: Stage 2 -  Partial thickness loss of dermis presenting as a shallow open injury with a red, pink wound bed without slough.  Wound Description (Comments):   Present on Admission: Yes         Subjective:  Patient feels well today, appetite has improved.  Denies any shortness of breath.  Physical Exam: Vitals:   07/21/23 2027 07/22/23 0459 07/22/23 0500 07/22/23 0805  BP: 113/74 108/69  (!) 87/64  Pulse: 100 91  80  Resp: 18 18  17   Temp: 98.2 F (36.8 C) 97.9 F (36.6 C)  98.2 F (36.8 C)  TempSrc:      SpO2: 97% 97%  97%  Weight:   87.4 kg   Height:       General exam: Appears calm and comfortable  Respiratory system: Decreased breath sounds without crackles today. Respiratory effort normal. Cardiovascular system: S1 & S2 heard, RRR. No JVD, murmurs, rubs, gallops or clicks. No pedal edema. Gastrointestinal system: Abdomen is nondistended, soft and nontender. No organomegaly or masses felt. Normal bowel sounds heard. Central nervous system: Alert and oriented x2. No focal neurological deficits. Extremities: Symmetric 5 x 5 power. Skin: No rashes, lesions or ulcers Psychiatry: Judgement and insight appear normal. Mood & affect appropriate.    Data Reviewed:  Lab results reviewed.  Family Communication:  None  Disposition: Status is: Inpatient Remains inpatient appropriate because: Severity of disease Had insurance approval for nursing placement.     Time spent: 35 minutes  Author: Donaciano Frizzle, MD 07/22/2023 12:58 PM  For on call review [email or web address].

## 2023-07-22 NOTE — Progress Notes (Addendum)
 Nutrition Follow-up  DOCUMENTATION CODES:   Obesity unspecified  INTERVENTION:   -D/c renal MVI -Continue 500 mg vitamin C  BID -Continue dysphagia 3 diet -Continue Ensure Enlive po TID, each supplement provides 350 kcal and 20 grams of protein -Continue Magic cup TID with meals, each supplement provides 290 kcal and 9 grams of protein  -MVI with minerals daily  NUTRITION DIAGNOSIS:   Inadequate oral intake related to acute illness as evidenced by per patient/family report.  Ongoing  GOAL:   Patient will meet greater than or equal to 90% of their needs  Progressing   MONITOR:   PO intake, Supplement acceptance, Labs, Weight trends, I & O's, Skin  REASON FOR ASSESSMENT:   Consult Assessment of nutrition requirement/status  ASSESSMENT:   78 y/o male with h/o dementia, CAD, cardiac arrest s/p AICD, HTN, HLD, PAF, chronic wounds and CKD who is admitted with septic shock, AKI and AMS.  Reviewed I/O's: -1.9 L x 24 hours and -5.6 L since admission  UOP: 1.9 L x 24 hours   Per nephrology notes, renal functioning improved and no further need for HD.   Spoke with pt at bedside, who reports discomfort secondary to room temperature. RD provided extra pillow per request and adjusted room temperature via thermostat.   Pt reports improved oral intake. Noted meal completions 25-100%. Pt consumed 100% of Ensure, 2/3 of Magic Cup, and 75% of mac and cheese. Discussed importance of good meal and supplement intake to promote healing.   No wt loss since admission.   Per TOC notes, plan SNF placement at discharge. Discharge was held yesterday secondary to hyperkalemia. K levels WDL today.   Medications reviewed and include vitamin C , megace , protonix , and prednisone .   Labs reviewed: CBGS: 98.    Diet Order:   Diet Order             DIET DYS 3 Room service appropriate? Yes with Assist; Fluid consistency: Thin  Diet effective now                   EDUCATION NEEDS:    Education needs have been addressed  Skin:  Skin Assessment: Skin Integrity Issues: Skin Integrity Issues:: Stage II, Other (Comment) Stage II: buttocks Other: IAD to sacrum  Last BM:  07/22/23 (type 6)  Height:   Ht Readings from Last 1 Encounters:  07/10/23 5\' 3"  (1.6 m)    Weight:   Wt Readings from Last 1 Encounters:  07/22/23 87.4 kg    Ideal Body Weight:  56.3 kg  BMI:  Body mass index is 34.13 kg/m.  Estimated Nutritional Needs:   Kcal:  2000-2300kcal/day  Protein:  100-115g/day  Fluid:  1.5-1.7L/day    Ethan Vega, RD, LDN, CDCES Registered Dietitian III Certified Diabetes Care and Education Specialist If unable to reach this RD, please use "RD Inpatient" group chat on secure chat between hours of 8am-4 pm daily

## 2023-07-22 NOTE — Plan of Care (Signed)

## 2023-07-22 NOTE — Progress Notes (Signed)
 [facility]  ROUNDING NOTE   Subjective:   Patient seen laying in bed  Alert Attempting to eat breakfast  05/05 0701 - 05/06 0700 In: -  Out: 1850 [Urine:1850] Lab Results  Component Value Date   CREATININE 1.78 (H) 07/22/2023   CREATININE 1.68 (H) 07/21/2023   CREATININE 1.78 (H) 07/20/2023     Objective:  Vital signs in last 24 hours:  Temp:  [97.7 F (36.5 C)-98.2 F (36.8 C)] 98.1 F (36.7 C) (05/06 1641) Pulse Rate:  [74-115] 115 (05/06 1641) Resp:  [17-28] 28 (05/06 1641) BP: (87-141)/(58-116) 141/116 (05/06 1641) SpO2:  [97 %-99 %] 98 % (05/06 1641) Weight:  [87.4 kg] 87.4 kg (05/06 0500)  Weight change: -0.7 kg Filed Weights   07/18/23 0327 07/21/23 0500 07/22/23 0500  Weight: 87.4 kg 88.1 kg 87.4 kg    Intake/Output: I/O last 3 completed shifts: In: 240 [P.O.:240] Out: 2150 [Urine:2150]   Intake/Output this shift:  No intake/output data recorded.  Physical Exam: General: NAD  Head: Normocephalic, atraumatic. Dry oral mucosal membranes  Eyes: Anicteric  Lungs:  Normal effort  Heart: Regular rate and rhythm  Abdomen:  Soft, nontender  Extremities: trace peripheral edema.  Neurologic: Awake, alert, conversant  Skin: No lesions  Access: None    Basic Metabolic Panel: Recent Labs  Lab 07/16/23 0326 07/17/23 0421 07/18/23 1610 07/19/23 0448 07/20/23 0419 07/21/23 0548 07/21/23 1241 07/21/23 1652 07/22/23 0337 07/22/23 1334  NA 137 136 132* 135 136 139  --   --  136  --   K 4.1 4.6 4.4 4.9 5.1 5.8* 5.6* 5.4* 5.6* 4.6  CL 104 106 104 103 105 107  --   --  102  --   CO2 23 23 21* 22 23 24   --   --  26  --   GLUCOSE 96 97 111* 96 115* 114*  --   --  112*  --   BUN 20 30* 38* 38* 47* 56*  --   --  61*  --   CREATININE 1.48* 1.96* 1.97* 1.80* 1.78* 1.68*  --   --  1.78*  --   CALCIUM  8.2* 8.4* 8.4* 8.9 8.9 8.9  --   --  8.9  --   MG 2.6* 2.4  --  2.2  --   --   --   --   --   --   PHOS 4.4  --   --   --   --   --   --   --    --   --     Liver Function Tests: Recent Labs  Lab 07/16/23 0326 07/17/23 0421  AST  --  31  ALT  --  39  ALKPHOS  --  41  BILITOT  --  1.5*  PROT  --  4.8*  ALBUMIN 2.3* 2.3*   No results for input(s): "LIPASE", "AMYLASE" in the last 168 hours.  No results for input(s): "AMMONIA" in the last 168 hours.  CBC: Recent Labs  Lab 07/16/23 0326 07/17/23 0421  WBC 11.0* 10.5  HGB 12.1* 12.5*  HCT 36.1* 37.0*  MCV 96.8 96.6  PLT 144* 153    Cardiac Enzymes: No results for input(s): "CKTOTAL", "CKMB", "CKMBINDEX", "TROPONINI" in the last 168 hours.  BNP: Invalid input(s): "POCBNP"  CBG: No results for input(s): "GLUCAP" in the last 168 hours.   Microbiology: Results for orders placed or performed during the hospital encounter of 07/10/23  Resp panel by RT-PCR (  RSV, Flu A&B, Covid) Anterior Nasal Swab     Status: None   Collection Time: 07/10/23  8:50 AM   Specimen: Anterior Nasal Swab  Result Value Ref Range Status   SARS Coronavirus 2 by RT PCR NEGATIVE NEGATIVE Final    Comment: (NOTE) SARS-CoV-2 target nucleic acids are NOT DETECTED.  The SARS-CoV-2 RNA is generally detectable in upper respiratory specimens during the acute phase of infection. The lowest concentration of SARS-CoV-2 viral copies this assay can detect is 138 copies/mL. A negative result does not preclude SARS-Cov-2 infection and should not be used as the sole basis for treatment or other patient management decisions. A negative result may occur with  improper specimen collection/handling, submission of specimen other than nasopharyngeal swab, presence of viral mutation(s) within the areas targeted by this assay, and inadequate number of viral copies(<138 copies/mL). A negative result must be combined with clinical observations, patient history, and epidemiological information. The expected result is Negative.  Fact Sheet for Patients:  BloggerCourse.com  Fact Sheet  for Healthcare Providers:  SeriousBroker.it  This test is no t yet approved or cleared by the [place]  FDA and  has been authorized for detection and/or diagnosis of SARS-CoV-2 by FDA under an Emergency Use Authorization (EUA). This EUA will remain  in effect (meaning this test can be used) for the duration of the COVID-19 declaration under Section 564(b)(1) of the Act, 21 U.S.C.section 360bbb-3(b)(1), unless the authorization is terminated  or revoked sooner.       Influenza A by PCR NEGATIVE NEGATIVE Final   Influenza B by PCR NEGATIVE NEGATIVE Final    Comment: (NOTE) The Xpert Xpress SARS-CoV-2/FLU/RSV plus assay is intended as an aid in the diagnosis of influenza from Nasopharyngeal swab specimens and should not be used as a sole basis for treatment. Nasal washings and aspirates are unacceptable for Xpert Xpress SARS-CoV-2/FLU/RSV testing.  Fact Sheet for Patients: BloggerCourse.com  Fact Sheet for Healthcare Providers: SeriousBroker.it  This test is not yet approved or cleared by the [place]  FDA and has been authorized for detection and/or diagnosis of SARS-CoV-2 by FDA under an Emergency Use Authorization (EUA). This EUA will remain in effect (meaning this test can be used) for the duration of the COVID-19 declaration under Section 564(b)(1) of the Act, 21 U.S.C. section 360bbb-3(b)(1), unless the authorization is terminated or revoked.     Resp Syncytial Virus by PCR NEGATIVE NEGATIVE Final    Comment: (NOTE) Fact Sheet for Patients: BloggerCourse.com  Fact Sheet for Healthcare Providers: SeriousBroker.it  This test is not yet approved or cleared by the [place]  FDA and has been authorized for detection and/or diagnosis of SARS-CoV-2 by FDA under an Emergency Use Authorization (EUA). This EUA will remain in effect (meaning  this test can be used) for the duration of the COVID-19 declaration under Section 564(b)(1) of the Act, 21 U.S.C. section 360bbb-3(b)(1), unless the authorization is terminated or revoked.  Performed at [facility], [address] [place], [place] [address]   Blood Culture (routine x 2)     Status: None   Collection Time: 07/10/23  8:50 AM   Specimen: BLOOD  Result Value Ref Range Status   Specimen Description BLOOD RIGHT [facility]  Final   Special Requests   Final    BOTTLES DRAWN AEROBIC AND ANAEROBIC Blood Culture results may not be optimal due to an inadequate volume of blood received in culture bottles   Culture   Final    NO GROWTH 5 DAYS  Performed at [facility], [address] [place], [place] [address]    Report Status 07/15/2023 FINAL  Final  Blood Culture (routine x 2)     Status: None   Collection Time: 07/10/23  8:05 PM   Specimen: BLOOD RIGHT HAND  Result Value Ref Range Status   Specimen Description BLOOD RIGHT HAND  Final   Special Requests   Final    BOTTLES DRAWN AEROBIC AND ANAEROBIC Blood Culture results may not be optimal due to an inadequate volume of blood received in culture bottles   Culture   Final    NO GROWTH 5 DAYS Performed at [facility], [address], [place], [place] [address]    Report Status 07/15/2023 FINAL  Final  MRSA Next Gen by PCR, Nasal     Status: None   Collection Time: 07/10/23  8:35 PM   Specimen: Nasal Mucosa; Nasal Swab  Result Value Ref Range Status   MRSA by PCR Next Gen NOT DETECTED NOT DETECTED Final    Comment: (NOTE) The GeneXpert MRSA Assay (FDA approved for NASAL specimens only), is one component of a comprehensive MRSA colonization surveillance program. It is not intended to diagnose MRSA infection nor to guide or monitor treatment for MRSA infections. Test performance is not FDA approved in patients less than 61 years old. Performed at [facility], [address]  [place], [place] [address]     Coagulation Studies: No results for input(s): "LABPROT", "INR" in the last 72 hours.   Urinalysis: No results for input(s): "COLORURINE", "LABSPEC", "PHURINE", "GLUCOSEU", "HGBUR", "BILIRUBINUR", "KETONESUR", "PROTEINUR", "UROBILINOGEN", "NITRITE", "LEUKOCYTESUR" in the last 72 hours.  Invalid input(s): "APPERANCEUR"     Imaging: No results found.     Medications:    sodium chloride        vitamin C   500 mg Oral BID   Chlorhexidine  Gluconate Cloth  6 each Topical Q0600   collagenase    Topical Daily   feeding supplement  237 mL Oral TID BM   levothyroxine   75 mcg Oral Q0600   lidocaine   1 patch Transdermal Q24H   lipase/protease/amylase  24,000 Units Oral TID AC   liver oil-zinc  oxide   Topical BID   megestrol   400 mg Oral Daily   midodrine   2.5 mg Oral TID WC   multivitamin with minerals  1 tablet Oral Daily   pantoprazole  (PROTONIX ) IV  40 mg Intravenous Q12H   pneumococcal 20-valent conjugate vaccine  0.5 mL Intramuscular Tomorrow-1000   predniSONE   5 mg Oral Q breakfast   QUEtiapine   25 mg Oral QHS   acetaminophen , docusate sodium , mouth rinse  Assessment/ Plan:  Mr. Ethan Vega. is a 78 y.o.  male  with a PMHx of hypertension, nonischemic cardiomyopathy, paroxysmal atrial fibrillation, history of ventricular tachycardia, hyperlipidemia, history of AICD placement, history of skin cancer, dementia, who was admitted to [facility] on 07/10/2023 for Cellulitis of left foot [L03.116] Septic shock (HCC) [A41.9, R65.21] Sepsis with acute renal failure and septic shock, due to unspecified organism, unspecified acute renal failure type (HCC) [A41.9, R65.21, N17.9] Acute renal failure superimposed on chronic kidney disease, unspecified acute renal failure type, unspecified CKD stage (HCC) [N17.9, N18.9]   Acute kidney injury/chronic kidney disease stage IIIb baseline EGFR 39 on 06/26/23/hyperkalemia.  Patient now with acute kidney injury secondary to  ATN most likely.  Patient has had rather poor p.o. intake at home over the past several days prior to admission according to the daughter.  CT  scan abdomen pelvis negative for obstruction.  Given severity of acute kidney injury recommend initiation of continuous renal placement therapy.  Appreciate critical care placing right femoral temporary dialysis catheter.  CRRT stopped on 4/29.  Renal function has returned to baseline. Will need follow up in our office at discharge.   05/05 0701 - 05/06 0700 In: -  Out: 1850 [Urine:1850] Lab Results  Component Value Date   CREATININE 1.78 (H) 07/22/2023   CREATININE 1.68 (H) 07/21/2023   CREATININE 1.78 (H) 07/20/2023      2.  Acute metabolic acidosis.  Corrected   3.  Hypotension.  Required pressors earlier in admission. Blood pressure acceptable at this time.   We will sign off at this time. Feel free to reconsult if needed.    LOS: 12 Ethan Vega 5/6/20255:11 PM

## 2023-07-22 NOTE — Progress Notes (Signed)
 OT Cancellation Note  Patient Details Name: Ethan Vega. MRN: [id number] DOB: [birth year]   Cancelled Treatment:    Reason Eval/Treat Not Completed: Medical issues which prohibited therapy. BP low, K+ 5.6. Will hold OT tx at this time and re-attempt when more appropriate for exertional activity.   Kirti Whitaker R., MPH, MS, OTR/L ascom [phone] 07/22/23, 10:00 AM

## 2023-07-22 NOTE — TOC Progression Note (Signed)
 Transition of Care (TOC) - Progression Note    Patient Details  Name: Ethan Vega. MRN: [id number] Date of Birth: 02/08/1946  Transition of Care [facility]) CM/SW Contact  Crayton Docker, RN Phone Number: 07/22/2023, 12:41 PM  Clinical Narrative:     CM returned call to patient's daughter, [name]. Per patient's daughter, stated did not know patient's insurance did not cover SNF. Patient's daughter informed, SNF for short term rehab is authorized and [facility] is aware, short term rehab is authorized. Patient's daughter stated "I called the facility and they stated I would have take him and then bring him back for rehab." CM informed patient's daughter, patient would remain at short term rehab prior to skilled nursing facility discharging patient. Patient's daughter stated "I see you are busy." And disconnected the call.   Expected Discharge Plan: Skilled Nursing Facility Barriers to Discharge: No Barriers Identified  Expected Discharge Plan and Services   Discharge Planning Services: CM Consult   Living arrangements for the past 2 months: Single Family Home (4 steps , handrails x 1)                  Social Determinants of Health (SDOH) Interventions SDOH Screenings   Food Insecurity: Patient Unable To Answer (07/10/2023)  Housing: Patient Unable To Answer (07/10/2023)  Transportation Needs: Patient Unable To Answer (07/10/2023)  Utilities: Patient Unable To Answer (07/10/2023)  Depression (PHQ2-9): Low Risk  (01/15/2023)  Social Connections: Patient Unable To Answer (07/10/2023)  Tobacco Use: Low Risk  (07/10/2023)    Readmission Risk Interventions     No data to display

## 2023-07-22 NOTE — Progress Notes (Signed)
 Dr Jeane Miguel made aware that Hgb on 5/1 12.5 and Hgb that was just collected after pt bloody BMs is 11.8, Per Md Dr Jeane Miguel with pts current BMs, condition and GI bleed, ok to give IV Kcentra, pts primary RN Alease Hunter aware

## 2023-07-22 NOTE — Progress Notes (Signed)
 Patient had 2 loose black stools, had near syncope episode. Appear to be GI bleed. Will dc eliquis , start protonix  40 mg iv bis. Give 500ml of NS, discontinue lasix . Follow hb q6, transfuse as needed. Also obtain GI consult.

## 2023-07-22 NOTE — Consult Note (Signed)
 Consultation  Referring Provider:    Hospitalist  Admit date: 07/10/2023 Consult date: 07/22/2023         Reason for Consultation: Dark stool              HPI:   Ethan Vega. is a 78 y.o. gentleman with history of a. Fib on DOAC, HLD, hypertension, and HFpEF who presented with sepsis presumed due to lower extremity wound. He was treated and started improving but today he has been having diarrhea that's been dark. Hemoglobin has been stable. He was started on DOAC today but that was stopped and kcentra was administered. He had a passing out episode after one diarrheal stool but hemodynamically he is stable currently. Of note he was started on PPI 5 days ago. Per notes he was having diarrhea prior to admission. He had a colonoscopy with some small polyps about 5 years ago.    Past Medical History:  Diagnosis Date   Actinic keratosis    AICD (automatic cardioverter/defibrillator) present 03/31/2014   a. 03/2014 s/p SJM 2411-36C dual chamber AICD (serial Number [id number])- followed by Dr. Rodolfo Clan   Arthritis    a. L knee.   Asthma    Basal cell carcinoma 08/29/2008   Vertex scalp. Keratotic pattern, ulcerated.   Basal cell carcinoma 01/15/2018   Left distal medial thigh near knee. Fibroepithelioma of pinkus type   Basal cell carcinoma 01/15/2018   Right distal lat. nose ant. inferior edge. Nodular pattern.   Cardiac arrest (HCC)    a. 03/30/2014 VF arrest in setting of NICM >> CPR/defib in community >> s/p dual chamber AICD   Coronary artery disease, non-occlusive    a. cath 03/2014 at [facility]: no sig CAD (OM1 30%, CFX 30%), EF 35%   Dementia (HCC)    Dysplastic nevus 01/31/2020   L mid back paraspinal - severe, excision 03/21/2020   HLD (hyperlipidemia)    HTN (hypertension)    Kidney stones    Melanoma (HCC)    Melanoma resected from scalp   NICM (nonischemic cardiomyopathy) (HCC)    a. 03/2014 Echo:  Inferolateral and lateral HK, EF 50-55%, grade 1 diastolic dysfunction, mild MR, mild  LAE, mild RAE, trivial TR, no effusion; 09/2018 Echo: EF 35-40%, impaired relaxation, glob HK, sev apical HK. Nl RV fxn. RVSP . Mildly dil LA.   Paroxysmal atrial fibrillation (HCC) 03/28/2015   Retinal artery occlusion     Past Surgical History:  Procedure Laterality Date   adenomatous polyps     CARDIAC CATHETERIZATION  03/30/2014   CARDIOVERSION  03/30/2014   COLONOSCOPY WITH PROPOFOL  N/A 03/24/2017   Procedure: COLONOSCOPY WITH PROPOFOL ;  Surgeon: Deveron Fly, MD;  Location: [facility] ENDOSCOPY;  Service: Endoscopy;  Laterality: N/A;   COLONOSCOPY WITH PROPOFOL  N/A 01/07/2019   Procedure: COLONOSCOPY WITH PROPOFOL ;  Surgeon: Toledo, Alphonsus Jeans, MD;  Location: [facility] ENDOSCOPY;  Service: Gastroenterology;  Laterality: N/A;   CYSTOSCOPY W/ LITHOLAPAXY / EHL     IMPLANTABLE CARDIOVERTER DEFIBRILLATOR IMPLANT N/A 03/31/2014   Procedure: IMPLANTABLE CARDIOVERTER DEFIBRILLATOR IMPLANT;  Surgeon: Tammie Fall, MD;  Location: [facility] CATH LAB;  Service: Cardiovascular;  Laterality: N/A;   JOINT REPLACEMENT     TONSILLECTOMY     "as a kid"   TOTAL KNEE ARTHROPLASTY Left ~ 2010   TOTAL KNEE ARTHROPLASTY WITH REVISION COMPONENTS Left ~ 2011   VARICOSE VEIN SURGERY Left ~ 2014    Family History  Problem Relation Age of Onset   Esophageal cancer Mother  died at 49   Heart attack Father        died at 27   Hypertension Brother    Colon polyps Brother     Social History   Tobacco Use   Smoking status: Never   Smokeless tobacco: Never  Vaping Use   Vaping status: Never Used  Substance Use Topics   Alcohol use: No   Drug use: No    Prior to Admission medications   Medication Sig Start Date End Date Taking? Authorizing Provider  acetaminophen  (TYLENOL ) 650 MG CR tablet Take 1,300 mg by mouth every 8 (eight) hours as needed for pain.   Yes [provider]  atorvastatin  (LIPITOR) 10 MG tablet Take 1 tablet (10 mg total) by mouth daily. 10/02/22  Yes Tejan-Sie, S Ahmed, MD   Azelastine  HCl 137 MCG/SPRAY SOLN SPARY 1 SPRAY PER NARE DAILY 09/06/22  Yes Tejan-Sie, S Ahmed, MD  cetirizine  (ZYRTEC ) 10 MG tablet Take 1 tablet (10 mg total) by mouth daily. Take 1 tablet (10 mg) by mouth once daily 05/14/23 08/12/23 Yes Tejan-Sie, S Ahmed, MD  fluticasone -salmeterol (WIXELA INHUB ) 250-50 MCG/ACT AEPB TAKE 1 PUFF BY MOUTH TWICE A DAY 01/15/23  Yes Tejan-Sie, S Ahmed, MD  furosemide  (LASIX ) 40 MG tablet TAKE 1 TABLET BY MOUTH TWICE A DAY 2 DAYS A WEEK AND 1 TABLET ONCE A DAY ALL OTHER DAYS 06/16/23  Yes Verona Goodwill, MD  levothyroxine  (SYNTHROID ) 75 MCG tablet TAKE 1 TABLET BY MOUTH EVERY DAY BEFORE BREAKFAST 07/07/23  Yes Shari Daughters, MD  losartan  (COZAAR ) 25 MG tablet Take 1 tablet (25 mg total) by mouth daily. 05/14/23 08/12/23 Yes Tejan-Sie, S Ahmed, MD  Multiple Vitamin (MULTIVITAMIN WITH MINERALS) TABS tablet Take 1 tablet by mouth daily.   Yes [provider]  sotalol  (BETAPACE ) 80 MG tablet Take 1 tablet (80 mg total) by mouth 2 (two) times daily. 06/26/23  Yes Verona Goodwill, MD  spironolactone  (ALDACTONE ) 25 MG tablet Take 0.5 tablets (12.5 mg total) by mouth daily. 06/26/23  Yes Verona Goodwill, MD  triamcinolone  (NASACORT ) 55 MCG/ACT AERO nasal inhaler Place 1 spray into the nose 2 (two) times daily. Patient taking differently: Place 1 spray into the nose daily. 04/23/23 07/22/23 Yes Shari Daughters, MD  apixaban  (ELIQUIS ) 5 MG TABS tablet Take 1 tablet (5 mg total) by mouth 2 (two) times daily. 06/13/23   Verona Goodwill, MD  donepezil  (ARICEPT ) 5 MG tablet TAKE 1 TABLET BY MOUTH EVERYDAY AT BEDTIME 07/01/23   Shari Daughters, MD  ketoconazole (NIZORAL) 2 % cream Apply 1 Application topically 2 (two) times daily. Patient not taking: Reported on 07/10/2023 07/02/23   [provider]    Current Facility-Administered Medications  Medication Dose Route Frequency Provider Last Rate Last Admin   0.9 %  sodium chloride  infusion   Intravenous Continuous  Zhang, Dekui, MD 50 mL/hr at 07/22/23 1813 New Bag at 07/22/23 1813   acetaminophen  (TYLENOL ) tablet 1,000 mg  1,000 mg Oral Q6H PRN Elisabeth Guild, NP   1,000 mg at 07/20/23 0515   ascorbic acid  (VITAMIN C ) tablet 500 mg  500 mg Oral BID Aleskerov, Fuad, MD   500 mg at 07/22/23 0948   Chlorhexidine  Gluconate Cloth 2 % PADS 6 each  6 each Topical Q0600 Anola King, NP   6 each at 07/22/23 0502   collagenase  (SANTYL ) ointment   Topical Daily Evertt Hoe, DPM   Given at 07/21/23 2048   docusate sodium  (  COLACE) capsule 100 mg  100 mg Oral BID PRN Aleskerov, Fuad, MD       feeding supplement (ENSURE ENLIVE / ENSURE PLUS) liquid 237 mL  237 mL Oral TID BM Aleskerov, Fuad, MD   237 mL at 07/22/23 1306   levothyroxine  (SYNTHROID ) tablet 75 mcg  75 mcg Oral Q0600 Grubb, Rodney D, RPH   75 mcg at 07/22/23 0501   lidocaine  (LIDODERM ) 5 % 1 patch  1 patch Transdermal Q24H Rust-Chester, Gara July, NP   1 patch at 07/22/23 0501   lipase/protease/amylase (CREON ) capsule 24,000 Units  24,000 Units Oral TID AC Zhang, Dekui, MD   24,000 Units at 07/22/23 1800   liver oil-zinc  oxide (DESITIN) 40 % ointment   Topical BID Aleskerov, Fuad, MD   Given at 07/22/23 1000   megestrol  (MEGACE ) 400 MG/10ML suspension 400 mg  400 mg Oral Daily Zhang, Dekui, MD   400 mg at 07/22/23 1191   midodrine  (PROAMATINE ) tablet 2.5 mg  2.5 mg Oral TID WC Zhang, Dekui, MD   2.5 mg at 07/22/23 1306   multivitamin with minerals tablet 1 tablet  1 tablet Oral Daily Donaciano Frizzle, MD   1 tablet at 07/22/23 1759   Oral care mouth rinse  15 mL Mouth Rinse PRN Donaciano Frizzle, MD       pantoprazole  (PROTONIX ) injection 40 mg  40 mg Intravenous Q12H Donaciano Frizzle, MD       pneumococcal 20-valent conjugate vaccine (PREVNAR 20) injection 0.5 mL  0.5 mL Intramuscular Tomorrow-1000 Aleskerov, Fuad, MD       predniSONE  (DELTASONE ) tablet 5 mg  5 mg Oral Q breakfast Zhang, Dekui, MD   5 mg at 07/22/23 4782   QUEtiapine  (SEROQUEL ) tablet  25 mg  25 mg Oral QHS Rust-Chester, Britton L, NP   25 mg at 07/21/23 2047   Facility-Administered Medications Ordered in Other Encounters  Medication Dose Route Frequency Provider Last Rate Last Admin   technetium tetrofosmin  (TC-MYOVIEW ) injection 23.3 millicurie  23.3 millicurie Intravenous Once PRN Elmyra Haggard, MD        Allergies as of 07/10/2023 - Review Complete 07/10/2023  Allergen Reaction Noted   Entresto  [sacubitril-valsartan]  11/21/2020     Review of Systems:    All systems reviewed and negative except where noted in HPI.  Review of Systems  Constitutional:  Positive for malaise/fatigue. Negative for chills and fever.  HENT:  Positive for hearing loss.   Respiratory:  Negative for shortness of breath.   Cardiovascular:  Negative for chest pain.  Gastrointestinal:  Positive for diarrhea and nausea. Negative for abdominal pain.  Musculoskeletal:  Positive for joint pain.  Neurological:  Positive for weakness.  Psychiatric/Behavioral:  Negative for substance abuse.   All other systems reviewed and are negative.      Physical Exam:  Vital signs in last 24 hours: Temp:  [97.4 F (36.3 C)-98.2 F (36.8 C)] 97.4 F (36.3 C) (05/06 1751) Pulse Rate:  [74-115] 115 (05/06 1751) Resp:  [17-28] 24 (05/06 1751) BP: (87-141)/(55-116) 105/55 (05/06 1751) SpO2:  [97 %-99 %] 99 % (05/06 1751) Weight:  [87.4 kg] 87.4 kg (05/06 0500) Last BM Date : 07/21/23 General:   Chronically ill appearing gentleman Head:  Normocephalic and atraumatic. Eyes:   No icterus.   Conjunctiva pink. Mouth: Mucosa pink moist, no lesions. Neck:  Supple; no masses felt Lungs:  No respiratory distress Abdomen:   Flat, soft, nondistended, nontender Msk:  Chronic venous stasis changes Neurologic:  Alert and  oriented. Globally weak Skin:  Warm, dry, pink Psych:  Alert and cooperative. Normal affect.  LAB RESULTS: Recent Labs    07/22/23 1705  HGB 11.8*   BMET Recent Labs     07/20/23 0419 07/21/23 0548 07/21/23 1241 07/21/23 1652 07/22/23 0337 07/22/23 1334  NA 136 139  --   --  136  --   K 5.1 5.8*   < > 5.4* 5.6* 4.6  CL 105 107  --   --  102  --   CO2 23 24  --   --  26  --   GLUCOSE 115* 114*  --   --  112*  --   BUN 47* 56*  --   --  61*  --   CREATININE 1.78* 1.68*  --   --  1.78*  --   CALCIUM  8.9 8.9  --   --  8.9  --    < > = values in this interval not displayed.   LFT No results for input(s): "PROT", "ALBUMIN", "AST", "ALT", "ALKPHOS", "BILITOT", "BILIDIR", "IBILI" in the last 72 hours. PT/INR No results for input(s): "LABPROT", "INR" in the last 72 hours.  STUDIES: No results found.     Impression / Plan:    78 y.o. gentleman with history of a. Fib on DOAC, HLD, hypertension, and HFpEF who presented with sepsis presumed due to lower extremity wound now with dark stool, hemodynamically stable. Unclear cause but given long hospitalization, antibiotics, and being on PPI I suspect more infectious in etiology versus PUD.  - maintain active type and screen - hold DOAC - trend hemoglobins - transfuse for hemoglobin < 7 - if persistent diarrhea that's non melenic, consider infectious studies - IV PPI - monitor for overt GI bleeding  Will continue to follow.  Olivia Bevel MD, MPH [facility] GI

## 2023-07-23 ENCOUNTER — Inpatient Hospital Stay: Admitting: Anesthesiology

## 2023-07-23 ENCOUNTER — Encounter
Admission: EM | Disposition: A | Discharge status: Skilled Nursing Facility | Payer: Self-pay | Source: Home / Self Care | Attending: Hospitalist

## 2023-07-23 ENCOUNTER — Encounter: Payer: Self-pay | Admitting: Pulmonary Disease

## 2023-07-23 DIAGNOSIS — L03116 Cellulitis of left lower limb: Secondary | ICD-10-CM | POA: Diagnosis not present

## 2023-07-23 DIAGNOSIS — A419 Sepsis, unspecified organism: Secondary | ICD-10-CM | POA: Diagnosis not present

## 2023-07-23 DIAGNOSIS — N179 Acute kidney failure, unspecified: Secondary | ICD-10-CM | POA: Diagnosis not present

## 2023-07-23 DIAGNOSIS — R6521 Severe sepsis with septic shock: Secondary | ICD-10-CM | POA: Diagnosis not present

## 2023-07-23 DIAGNOSIS — R627 Adult failure to thrive: Secondary | ICD-10-CM | POA: Diagnosis not present

## 2023-07-23 HISTORY — PX: ESOPHAGOGASTRODUODENOSCOPY: SHX5428

## 2023-07-23 LAB — GASTROINTESTINAL PANEL BY PCR, STOOL (REPLACES STOOL CULTURE)

## 2023-07-23 LAB — BASIC METABOLIC PANEL WITH GFR
Anion gap: 8 (ref 5–15)
BUN: 81 mg/dL — ABNORMAL HIGH (ref 8–23)
CO2: 24 mmol/L (ref 22–32)
Calcium: 9.1 mg/dL (ref 8.9–10.3)
Chloride: 106 mmol/L (ref 98–111)
Creatinine, Ser: 1.78 mg/dL — ABNORMAL HIGH (ref 0.61–1.24)
GFR, Estimated: 38 mL/min — ABNORMAL LOW
Glucose, Bld: 104 mg/dL — ABNORMAL HIGH (ref 70–99)
Potassium: 5.1 mmol/L (ref 3.5–5.1)
Sodium: 138 mmol/L (ref 135–145)

## 2023-07-23 LAB — PROTIME-INR
INR: 1.1 (ref 0.8–1.2)
Prothrombin Time: 14.8 s (ref 11.4–15.2)

## 2023-07-23 LAB — CBC
HCT: 32.1 % — ABNORMAL LOW (ref 39.0–52.0)
Hemoglobin: 10.7 g/dL — ABNORMAL LOW (ref 13.0–17.0)
MCH: 32.9 pg (ref 26.0–34.0)
MCHC: 33.3 g/dL (ref 30.0–36.0)
MCV: 98.8 fL (ref 80.0–100.0)
Platelets: 149 10*3/uL — ABNORMAL LOW (ref 150–400)
RBC: 3.25 MIL/uL — ABNORMAL LOW (ref 4.22–5.81)
RDW: 16.9 % — ABNORMAL HIGH (ref 11.5–15.5)
WBC: 12.2 10*3/uL — ABNORMAL HIGH (ref 4.0–10.5)
nRBC: 0 % (ref 0.0–0.2)

## 2023-07-23 LAB — HEMOGLOBIN
Hemoglobin: 10.1 g/dL — ABNORMAL LOW (ref 13.0–17.0)
Hemoglobin: 11.1 g/dL — ABNORMAL LOW (ref 13.0–17.0)
Hemoglobin: 8.6 g/dL — ABNORMAL LOW (ref 13.0–17.0)

## 2023-07-23 SURGERY — EGD (ESOPHAGOGASTRODUODENOSCOPY)
Anesthesia: General

## 2023-07-23 MED ORDER — PROCHLORPERAZINE EDISYLATE 10 MG/2ML IJ SOLN
10.0000 mg | Freq: Once | INTRAMUSCULAR | Status: AC
  Administered 2023-07-23: 10 mg via INTRAVENOUS
  Filled 2023-07-23: qty 2

## 2023-07-23 MED ORDER — SODIUM CHLORIDE 0.9 % IV BOLUS
500.0000 mL | Freq: Once | INTRAVENOUS | Status: AC
  Administered 2023-07-23: 500 mL via INTRAVENOUS

## 2023-07-23 MED ORDER — DEXMEDETOMIDINE HCL IN NACL 80 MCG/20ML IV SOLN
INTRAVENOUS | Status: DC | PRN
Start: 2023-07-23 — End: 2023-07-23
  Administered 2023-07-23: 4 ug via INTRAVENOUS

## 2023-07-23 MED ORDER — SODIUM CHLORIDE 0.9 % IV SOLN: INTRAVENOUS | Status: DC | PRN

## 2023-07-23 MED ORDER — PROPOFOL 10 MG/ML IV BOLUS
INTRAVENOUS | Status: DC | PRN
  Administered 2023-07-23: 20 mg via INTRAVENOUS
  Administered 2023-07-23: 70 mg via INTRAVENOUS

## 2023-07-23 MED ORDER — PHENYLEPHRINE 80 MCG/ML (10ML) SYRINGE FOR IV PUSH (FOR BLOOD PRESSURE SUPPORT)
PREFILLED_SYRINGE | INTRAVENOUS | Status: AC
  Filled 2023-07-23: qty 10

## 2023-07-23 MED ORDER — MIDODRINE HCL 5 MG PO TABS
10.0000 mg | ORAL_TABLET | Freq: Three times a day (TID) | ORAL | Status: DC
  Administered 2023-07-23 – 2023-08-01 (×23): 10 mg via ORAL
  Filled 2023-07-23 (×23): qty 2

## 2023-07-23 MED ORDER — SODIUM CHLORIDE 0.9 % IV SOLN: INTRAVENOUS | Status: AC

## 2023-07-23 MED ORDER — PHENYLEPHRINE 80 MCG/ML (10ML) SYRINGE FOR IV PUSH (FOR BLOOD PRESSURE SUPPORT)
PREFILLED_SYRINGE | INTRAVENOUS | Status: DC | PRN
  Administered 2023-07-23 (×3): 160 ug via INTRAVENOUS

## 2023-07-23 MED ORDER — ACETAMINOPHEN 500 MG PO TABS
ORAL_TABLET | ORAL | Status: AC
  Filled 2023-07-23: qty 2

## 2023-07-23 NOTE — Progress Notes (Addendum)
 OT Cancellation Note  Patient Details Name: Ethan Vega. MRN: [id number] DOB: 06-Aug-1945   Cancelled Treatment:    Reason Eval/Treat Not Completed: Patient at procedure or test/ unavailable. Pt noted to be off unit for procedure. OT will check back as able and when pt medically appropriate.  Wren Pryce L. Simonne Boulos, OTR/L  07/23/23, 1:41 PM

## 2023-07-23 NOTE — Anesthesia Postprocedure Evaluation (Signed)
 Anesthesia Post Note  Patient: Ethan Vega.  Procedure(s) Performed: EGD (ESOPHAGOGASTRODUODENOSCOPY)  Patient location during evaluation: Endoscopy Anesthesia Type: General Level of consciousness: awake and alert Pain management: pain level controlled Vital Signs Assessment: post-procedure vital signs reviewed and stable Respiratory status: spontaneous breathing, nonlabored ventilation, respiratory function stable and patient connected to nasal cannula oxygen Cardiovascular status: blood pressure returned to baseline and stable Postop Assessment: no apparent nausea or vomiting Anesthetic complications: no   No notable events documented.   Last Vitals:  Vitals:   07/23/23 1432 07/23/23 1438  BP: (!) 88/62 100/71  Pulse:  (!) 108  Resp: (!) 24   Temp:    SpO2: 98% 99%    Last Pain:  Vitals:   07/23/23 1444  TempSrc:   PainSc: 7                  Lattie Poli

## 2023-07-23 NOTE — Care Plan (Signed)
 EGD with gastric and duodenal ulcer. Clean based so did not require treatment. Recommend checking INR and correcting coagulopathy if presents. Continue IV PPI BID. Anticipate some continue melenic bowel movements.  - check INR, correct with goal of < 1.5 - clear liquids - IV PPI BID - check cbc's regularly, transfuse for hemoglobin < 7 - if hemodynamically significant bleeding then would check CTA - consider a short course of IVF given significant GI loss of liquid - anticipate some residual melenic stools - Will continue to follow  Olivia Bevel MD, MPH [facility] GI

## 2023-07-23 NOTE — Progress Notes (Signed)
 Palliative Care Progress Note, Assessment & Plan   Patient Name: Ethan Vega.       Date: 07/23/2023 DOB: 03/08/1946  Age: 78 y.o. MRN#: [id number] Attending Physician: Garrison Kanner, MD Primary Care Physician: Shari Daughters, MD Admit Date: 07/10/2023  Subjective: Patient is asleep with respirations even and unlabored.  He is just returned from endoscopy.  He is sleepy but easily arousable.  No family or friends present during my visit.  HPI: 79 y.o. male  with past medical history of  essential hypertension, nonischemic cardiomyopathy history of paroxysmal A-fib and ventricular tachycardia, dyslipidemia. He presented to hospital 07/10/23 to have ICD generator changed. Staff found him to have open wounds to LLE, confusion and hypotension. He was subsequently sent to ED for evaluation. ED workup found patient to have AKI, leukocytosis and hypotension with creatinine 5.5 (baseline 1.7), Na+ 134, K+ 5.4, BUN 151, BNP 672.1, Trop 164, Lactic 2.2 and WBC 16.1.   He received IV fluid resuscitation but required pressor support. Daughter at bedside reported that patient lives alone, performing his ADLs independently. She adds that he complained of diarrhea and poor PO intake 2 weeks prior. Pt was unaware of the open sore to his LLE but did confirm chronic lower extremity pitting edema. He was admitted to ICU for circulatory shock with AKI.    Palliative care was consulted to discuss goals of care.  Summary of counseling/coordination of care: Extensive chart review completed prior to meeting patient including labs, vital signs, imaging, progress notes, orders, and available advanced directive documents from current and previous encounters.   After reviewing the patient's chart and assessing the patient at  bedside, I spoke with patient in regards to symptom management and goals of care.   Patient is resting soundly and unable to participate in symptom assessment.  No distress noted.  No adjustment to [facility] needed.  After visiting with patient, I spoke with his daughter Davidheiser over the phone.  Updated her that patient has returned and is stable after endoscopy.  Endoscopy results pending.  Discussed patient's hyperkalemia has improved with potassium at 5.1.  She is grateful for this update.  We again discussed boundaries and goals of care.  Devone continues to say she wants what is best for her father. Treating the treatable. DNR with limited interventions remains.  Reviewed that once results of endoscopy are known we can further discuss next steps and goals of care.  PMT will continue to follow and support patient throughout his hospitalization.  Physical Exam Vitals reviewed.  HENT:     Head: Normocephalic.     Nose: Nose normal.     Mouth/Throat:     Mouth: Mucous membranes are moist.  Eyes:     Pupils: Pupils are equal, round, and reactive to light.  Cardiovascular:     Rate and Rhythm: Normal rate.     Pulses: Normal pulses.  Pulmonary:     Effort: Pulmonary effort is normal.  Abdominal:     Palpations: Abdomen is soft.  Musculoskeletal:     Comments: Generalized weakness  Skin:    General: Skin is warm and dry.  Psychiatric:        Mood and  Affect: Mood normal.        Behavior: Behavior normal.             Total Time 35 minutes   Time spent includes: Detailed review of medical records (labs, imaging, vital signs), medically appropriate exam (mental status, respiratory, cardiac, skin), discussed with treatment team, counseling and educating patient, family and staff, documenting clinical information, medication management and coordination of care.  Judeen Nose L. Rebbeca Campi, DNP, FNP-BC Palliative Medicine Team

## 2023-07-23 NOTE — TOC Progression Note (Signed)
 Transition of Care (TOC) - Progression Note    Patient Details  Name: Ethan Vega. MRN: [id number] Date of Birth: 03/05/1946  Transition of Care [facility]) CM/SW Contact  Crayton Docker, RN Phone Number: 07/23/2023, 3:45 PM  Clinical Narrative:      Alert received from Tammy, Admissions, [facility] regarding patient status. CM alert to Dr. Gordy Lauber regarding patient's medical stability for discharge. Per Dr. Gordy Lauber, patient has GI bleeding. CM secure message to Tammy, Admissions regarding patient status. Patient [name] expires today and will require a new auth  Expected Discharge Plan: Skilled Nursing Facility Barriers to Discharge: No Barriers Identified  Expected Discharge Plan and Services   Discharge Planning Services: CM Consult   Living arrangements for the past 2 months: Single Family Home (4 steps , handrails x 1)     Social Determinants of Health (SDOH) Interventions SDOH Screenings   Food Insecurity: Patient Unable To Answer (07/10/2023)  Housing: Patient Unable To Answer (07/10/2023)  Transportation Needs: Patient Unable To Answer (07/10/2023)  Utilities: Patient Unable To Answer (07/10/2023)  Depression (PHQ2-9): Low Risk  (01/15/2023)  Social Connections: Patient Unable To Answer (07/10/2023)  Tobacco Use: Low Risk  (07/23/2023)    Readmission Risk Interventions     No data to display

## 2023-07-23 NOTE — Progress Notes (Signed)
 PROGRESS NOTE    Ethan Vega.  UXL:[id number] DOB: October 17, 1945 DOA: 07/10/2023 PCP: Shari Daughters, MD  126A/126A-AA  LOS: 13 days   Brief hospital course:   Assessment & Plan: Ethan Vega is a 78 year old male with a history of essential hypertension, nonischemic cardiomyopathy history of paroxysmal A-fib and ventricular tachycardia, dyslipidemia who was seen earlier today with electrophysiology service to have ICD generator change however he was noted to have open sores on his left lower extremity and was mentating with confusion and encephalopathy with decision to abort procedure and reschedule. He was subsequently sent to the ER. His workup revealed severe AKI and leukocytosis and vitals with shock physiology. Patient received IV fluid resuscitation but despite 3 L of intravenous fluids continues to require Levophed  support. He reports diarrhea over the past week. Patient currently lives alone in his home and was previously able to perform all activities of daily living. He is unaware of the open score and cellulitic lesion of his left foot.    Upper GI bleed 2/2 gastric and duodenal ulcer.  --multiple dark bloody BM's today. --EGD today found clean based gastric and duodenal ulcers. --clear liquid diet --cont IV PPI BID --cont MIVF  Hypotension --cont MIVF --increase midodrine  to 10 mg TID  Acute blood loss anemia --monitor Hgb, transfuse to keep Hgb >7  Septic shock secondary to left foot infection. Left foot ulcer with cellulitis. Elevated troponin secondary to septic shock. Adrenal insufficiency. Patient had received IV fluid bolus, he also received Levophed .  Currently off Levophed , blood pressure is borderline low, added midodrine .  Also check cortisol level. Blood Culture has no growth. Patient was treated with vancomycin , completed dose.  Then on Zosyn . CT scan of the foot did not show any bony involvement. He was seen by podiatry, no need for debridement.   I will continue oral antibiotics with Augmentin  for additional 3 days to complete 10-day course.  Condition seems to be improved. Patient low blood pressure was also associated with adrenal insufficiency, cortisol level in the morning was only 3.5, started prednisone  5 mg daily. --cont steroid   Paroxysmal atrial fibrillation. Nonsustained ventricular tachycardia. Nonischemic cardiomyopathy with chronic systolic congestive heart failure with EF 30-35%. --hold anticoagulation due to GI bleed   Failure to thrive. Anorexia. Chronic diarrhea. No longer has diarrhea on Creon . Also started Protonix , megestrol . Patient condition finally improving, appetite is better.    Acute kidney injury chronic kidney disease stage IIIa. Hyponatremia. Hyperkalemia Followed by nephrology, briefly needing dialysis.  Renal function had improved.   Received Lokelma and IV sodium bicarb, and Veltassa.   --monitor   Acute metabolic encephalopathy. Chronic dementia. Condition appears to be stable.   Debility. PT/OT recommended nursing home placement.   Class 1 obesity. BMI 32.77 Patient currently has a very poor appetite.   Pressure ulcer POA Pressure Injury 07/11/23 Buttocks Left Stage 2 -  Partial thickness loss of dermis presenting as a shallow open injury with a red, pink wound bed without slough. (Active)  07/11/23 1049  Location: Buttocks  Location Orientation: Left  Staging: Stage 2 -  Partial thickness loss of dermis presenting as a shallow open injury with a red, pink wound bed without slough.  Wound Description (Comments):   Present on Admission: Yes     Pressure Injury 07/11/23 Buttocks Medial Stage 2 -  Partial thickness loss of dermis presenting as a shallow open injury with a red, pink wound bed without slough. (Active)  07/11/23 1100  Location: Buttocks  Location Orientation: Medial  Staging: Stage 2 -  Partial thickness loss of dermis presenting as a shallow open injury with a red,  pink wound bed without slough.  Wound Description (Comments):   Present on Admission: Yes    DVT prophylaxis: SCD/Compression stockings Code Status: DNR  Family Communication:  Level of care: Med-Surg Dispo:   The patient is from: home Anticipated d/c is to: SNF rehab Anticipated d/c date is: 1-2 days medical ready   Subjective and Interval History:  Pt had multiple dark bloody BM's today, with hypotension.    EGD today found gastric and duodenal ulcer.   Objective: Vitals:   07/23/23 1448 07/23/23 1546 07/23/23 1600 07/23/23 2003  BP: 94/74 (!) 139/127 (!) 96/50 114/77  Pulse: 100 (!) 109 77 96  Resp:  20 18 17   Temp:  97.9 F (36.6 C)  98 F (36.7 C)  TempSrc:      SpO2: 100% 100%  100%  Weight:      Height:        Intake/Output Summary (Last 24 hours) at 07/23/2023 2054 Last data filed at 07/23/2023 1905 Gross per 24 hour  Intake 786.29 ml  Output --  Net 786.29 ml   Filed Weights   07/21/23 0500 07/22/23 0500 07/23/23 0500  Weight: 88.1 kg 87.4 kg 85.6 kg    Examination:   Constitutional: NAD, AAOx3 HEENT: conjunctivae and lids normal, EOMI CV: No cyanosis.   RESP: normal respiratory effort, on RA Neuro: II - XII grossly intact.   Psych: depressed mood and affect.     Data Reviewed: I have personally reviewed labs and imaging studies  Time spent: 50 minutes  Garrison Kanner, MD [facility] If 7PM-7AM, please contact night-coverage 07/23/2023, 8:54 PM

## 2023-07-23 NOTE — Progress Notes (Signed)
 PT Cancellation Note  Patient Details Name: Ethan Vega. MRN: [id number] DOB: 08-22-1945   Cancelled Treatment:    Reason Eval/Treat Not Completed: Patient at procedure or test/unavailable. Pt noted to be off unit for procedure. PT will check back as able and when pt medically appropriate.   Darien Eden PT, DPT 2:26 PM,07/23/23

## 2023-07-23 NOTE — Anesthesia Preprocedure Evaluation (Addendum)
 Anesthesia Evaluation  Patient identified by MRN, date of birth, ID band Patient awake    Reviewed: Allergy & Precautions, NPO status , Patient's Chart, lab work & pertinent test results  History of Anesthesia Complications Negative for: history of anesthetic complications  Airway Mallampati: III  TM Distance: >3 FB Neck ROM: full    Dental  (+) Edentulous Upper, Partial Lower   Pulmonary asthma    Pulmonary exam normal        Cardiovascular hypertension, On Medications + CAD  + dysrhythmias Atrial Fibrillation + Cardiac Defibrillator      Neuro/Psych  PSYCHIATRIC DISORDERS     Dementia negative neurological ROS     GI/Hepatic negative GI ROS, Neg liver ROS,,,  Endo/Other  Hypothyroidism    Renal/GU Renal disease  negative genitourinary   Musculoskeletal   Abdominal   Peds  Hematology negative hematology ROS (+)   Anesthesia Other Findings Past Medical History: No date: Actinic keratosis 03/31/2014: AICD (automatic cardioverter/defibrillator) present     Comment:  a. 03/2014 s/p SJM 2411-36C dual chamber AICD (serial               Number [id number])- followed by Dr. Rodolfo Clan No date: Arthritis     Comment:  a. L knee. No date: Asthma 08/29/2008: Basal cell carcinoma     Comment:  Vertex scalp. Keratotic pattern, ulcerated. 01/15/2018: Basal cell carcinoma     Comment:  Left distal medial thigh near knee. Fibroepithelioma of               pinkus type 01/15/2018: Basal cell carcinoma     Comment:  Right distal lat. nose ant. inferior edge. Nodular               pattern. No date: Cardiac arrest [facility])     Comment:  a. 03/30/2014 VF arrest in setting of NICM >> CPR/defib               in community >> s/p dual chamber AICD No date: Coronary artery disease, non-occlusive     Comment:  a. cath 03/2014 at [facility]: no sig CAD (OM1 30%, CFX 30%),               EF 35% No date: Dementia (HCC) 01/31/2020: Dysplastic nevus      Comment:  L mid back paraspinal - severe, excision 03/21/2020 No date: HLD (hyperlipidemia) No date: HTN (hypertension) No date: Kidney stones No date: Melanoma (HCC)     Comment:  Melanoma resected from scalp No date: NICM (nonischemic cardiomyopathy) (HCC)     Comment:  a. 03/2014 Echo:  Inferolateral and lateral HK, EF               50-55%, grade 1 diastolic dysfunction, mild MR, mild LAE,              mild RAE, trivial TR, no effusion; 09/2018 Echo: EF               35-40%, impaired relaxation, glob HK, sev apical HK. Nl               RV fxn. RVSP . Mildly dil LA. 03/28/2015: Paroxysmal atrial fibrillation (HCC) No date: Retinal artery occlusion  Past Surgical History: No date: adenomatous polyps 03/30/2014: CARDIAC CATHETERIZATION 03/30/2014: CARDIOVERSION 03/24/2017: COLONOSCOPY WITH PROPOFOL ; N/A     Comment:  Procedure: COLONOSCOPY WITH PROPOFOL ;  Surgeon:               Deveron Fly, MD;  Location: [facility] ENDOSCOPY;                Service: Endoscopy;  Laterality: N/A; 01/07/2019: COLONOSCOPY WITH PROPOFOL ; N/A     Comment:  Procedure: COLONOSCOPY WITH PROPOFOL ;  Surgeon: [place],               Alphonsus Jeans, MD;  Location: [facility] ENDOSCOPY;  Service:               Gastroenterology;  Laterality: N/A; No date: CYSTOSCOPY W/ LITHOLAPAXY / EHL 03/31/2014: IMPLANTABLE CARDIOVERTER DEFIBRILLATOR IMPLANT; N/A     Comment:  Procedure: IMPLANTABLE CARDIOVERTER DEFIBRILLATOR               IMPLANT;  Surgeon: Tammie Fall, MD;  Location: [facility] CATH              LAB;  Service: Cardiovascular;  Laterality: N/A; No date: JOINT REPLACEMENT No date: TONSILLECTOMY     Comment:  "as a kid" ~ 2010: TOTAL KNEE ARTHROPLASTY; Left ~ 2011: TOTAL KNEE ARTHROPLASTY WITH REVISION COMPONENTS; Left ~ 2014: VARICOSE VEIN SURGERY; Left  BMI    Body Mass Index: 33.43 kg/m      Reproductive/Obstetrics negative OB ROS                             Anesthesia  Physical Anesthesia Plan  ASA: 3  Anesthesia Plan: General   Post-op Pain Management: Minimal or no pain anticipated   Induction: Intravenous  PONV Risk Score and Plan: 1 and Propofol  infusion and TIVA  Airway Management Planned: Natural Airway and Nasal Cannula  Additional Equipment:   Intra-op Plan:   Post-operative Plan:   Informed Consent: I have reviewed the patients History and Physical, chart, labs and discussed the procedure including the risks, benefits and alternatives for the proposed anesthesia with the patient or authorized representative who has indicated his/her understanding and acceptance.     Dental Advisory Given  Plan Discussed with: Anesthesiologist, CRNA and Surgeon  Anesthesia Plan Comments: (Patient consented for risks of anesthesia including but not limited to:  - adverse reactions to medications - risk of airway placement if required - damage to eyes, teeth, lips or other oral mucosa - nerve damage due to positioning  - sore throat or hoarseness - Damage to heart, brain, nerves, lungs, other parts of body or loss of life  Patient voiced understanding and assent.)        Anesthesia Quick Evaluation

## 2023-07-23 NOTE — Transfer of Care (Signed)
 Immediate Anesthesia Transfer of Care Note  Patient: Ethan Vega.  Procedure(s) Performed: EGD (ESOPHAGOGASTRODUODENOSCOPY)  Patient Location: PACU and Endoscopy Unit  Anesthesia Type:General  Level of Consciousness: drowsy and patient cooperative  Airway & Oxygen Therapy: Patient Spontanous Breathing  Post-op Assessment: Report given to RN and Post -op Vital signs reviewed and stable  Post vital signs: Reviewed and stable  Last Vitals:  Vitals Value Taken Time  BP 88/62 07/23/23 1432  Temp 35.4 C 07/23/23 1429  Pulse 105 07/23/23 1429  Resp 24 07/23/23 1432  SpO2 98 % 07/23/23 1432    Last Pain:  Vitals:   07/23/23 1429  TempSrc:   PainSc: Asleep         Complications: No notable events documented.

## 2023-07-23 NOTE — Op Note (Signed)
 [facility] [place] [facility] Patient Name: Ethan Vega Procedure Date: 07/23/2023 2:01 PM MRN: [id number] Account #: [id number] Date of Birth: 06-24-1945 Admit Type: Inpatient Age: 78 Room: [facility] ENDO ROOM 1 Gender: Male Note Status: Finalized Instrument Name: [name] Endoscope [id number] Procedure:             Upper GI endoscopy Indications:           Melena Providers:             Leida Puna MD, MD Referring MD:          Larissa Plowman. Donley Furth, MD (Referring MD) Medicines:             Monitored Anesthesia Care Complications:         No immediate complications. Procedure:             Pre-Anesthesia Assessment:                        - Prior to the procedure, a History and Physical was                         performed, and patient medications and allergies were                         reviewed. The patient is competent. The risks and                         benefits of the procedure and the sedation options and                         risks were discussed with the patient. All questions                         were answered and informed consent was obtained.                         Patient identification and proposed procedure were                         verified by the physician, the nurse, the                         anesthesiologist, the anesthetist and the technician                         in the endoscopy suite. Mental Status Examination:                         alert and oriented. Airway Examination: normal                         oropharyngeal airway and neck mobility. Respiratory                         Examination: clear to auscultation. CV Examination:                         normal. Prophylactic Antibiotics: The patient does not  require prophylactic antibiotics. Prior                         Anticoagulants: The patient has taken Eliquis                          (apixaban ), last dose was 1 day prior to procedure.                          ASA Grade Assessment: III - A patient with severe                         systemic disease. After reviewing the risks and                         benefits, the patient was deemed in satisfactory                         condition to undergo the procedure. The anesthesia                         plan was to use monitored anesthesia care (MAC).                         Immediately prior to administration of medications,                         the patient was re-assessed for adequacy to receive                         sedatives. The heart rate, respiratory rate, oxygen                         saturations, blood pressure, adequacy of pulmonary                         ventilation, and response to care were monitored                         throughout the procedure. The physical status of the                         patient was re-assessed after the procedure.                        After obtaining informed consent, the endoscope was                         passed under direct vision. Throughout the procedure,                         the patient's blood pressure, pulse, and oxygen                         saturations were monitored continuously. The Endoscope                         was introduced through the mouth, and advanced to the  second part of duodenum. The upper GI endoscopy was                         accomplished without difficulty. The patient tolerated                         the procedure well. Findings:      A small hiatal hernia was present.      Clotted blood was found in the gastric fundus. Attempted to clean as       much as possible and got fair visualization underneath.      One non-bleeding cratered gastric ulcer with a clean ulcer base (Forrest       Class III) was found in the gastric body. The lesion was 4 mm in largest       dimension.      One non-bleeding superficial duodenal ulcer with no stigmata of bleeding       was found in the first  portion of the duodenum. The lesion was 2 mm in       largest dimension. Impression:            - Small hiatal hernia.                        - Clotted blood in the gastric fundus.                        - Non-bleeding gastric ulcer with a clean ulcer base                         (Forrest Class III).                        - Non-bleeding duodenal ulcer with no stigmata of                         bleeding.                        - No specimens collected. Recommendation:        - Return patient to hospital ward for ongoing care.                        - Clear liquid diet today.                        - Continue present medications. Continue IV PPI BID                         for now.                        - Check PT/INR and correct any coagulopathy Procedure Code(s):     --- Professional ---                        [id number], Esophagogastroduodenoscopy, flexible,                         transoral; diagnostic, including collection of  specimen(s) by brushing or washing, when performed                         (separate procedure) Diagnosis Code(s):     --- Professional ---                        K44.9, Diaphragmatic hernia without obstruction or                         gangrene                        K92.2, Gastrointestinal hemorrhage, unspecified                        K25.9, Gastric ulcer, unspecified as acute or chronic,                         without hemorrhage or perforation                        K26.9, Duodenal ulcer, unspecified as acute or                         chronic, without hemorrhage or perforation                        K92.1, Melena (includes Hematochezia) CPT copyright 2022 American [employer] Association. All rights reserved. The codes documented in this report are preliminary and upon coder review may  be revised to meet current compliance requirements. Leida Puna MD, MD 07/23/2023 2:33:51 PM Number of Addenda: 0 Note Initiated On: 07/23/2023 2:01  PM Estimated Blood Loss:  Estimated blood loss: none.      [facility]

## 2023-07-24 ENCOUNTER — Ambulatory Visit

## 2023-07-24 DIAGNOSIS — A419 Sepsis, unspecified organism: Secondary | ICD-10-CM | POA: Diagnosis not present

## 2023-07-24 DIAGNOSIS — N179 Acute kidney failure, unspecified: Secondary | ICD-10-CM | POA: Diagnosis not present

## 2023-07-24 DIAGNOSIS — L03116 Cellulitis of left lower limb: Secondary | ICD-10-CM | POA: Diagnosis not present

## 2023-07-24 DIAGNOSIS — N171 Acute kidney failure with acute cortical necrosis: Secondary | ICD-10-CM | POA: Diagnosis not present

## 2023-07-24 DIAGNOSIS — R6521 Severe sepsis with septic shock: Secondary | ICD-10-CM | POA: Diagnosis not present

## 2023-07-24 LAB — CBC
HCT: 22.3 % — ABNORMAL LOW (ref 39.0–52.0)
Hemoglobin: 7.4 g/dL — ABNORMAL LOW (ref 13.0–17.0)
MCH: 33 pg (ref 26.0–34.0)
MCHC: 33.2 g/dL (ref 30.0–36.0)
MCV: 99.6 fL (ref 80.0–100.0)
Platelets: 133 10*3/uL — ABNORMAL LOW (ref 150–400)
RBC: 2.24 MIL/uL — ABNORMAL LOW (ref 4.22–5.81)
RDW: 17.4 % — ABNORMAL HIGH (ref 11.5–15.5)
WBC: 14.5 10*3/uL — ABNORMAL HIGH (ref 4.0–10.5)
nRBC: 0.1 % (ref 0.0–0.2)

## 2023-07-24 LAB — BASIC METABOLIC PANEL WITH GFR
Anion gap: 8 (ref 5–15)
BUN: 88 mg/dL — ABNORMAL HIGH (ref 8–23)
CO2: 21 mmol/L — ABNORMAL LOW (ref 22–32)
Calcium: 8.6 mg/dL — ABNORMAL LOW (ref 8.9–10.3)
Chloride: 108 mmol/L (ref 98–111)
Creatinine, Ser: 1.84 mg/dL — ABNORMAL HIGH (ref 0.61–1.24)
GFR, Estimated: 37 mL/min — ABNORMAL LOW (ref >60)
Glucose, Bld: 95 mg/dL (ref 70–99)
Potassium: 5.3 mmol/L — ABNORMAL HIGH (ref 3.5–5.1)
Sodium: 137 mmol/L (ref 135–145)

## 2023-07-24 LAB — MAGNESIUM: Magnesium: 1.8 mg/dL (ref 1.7–2.4)

## 2023-07-24 LAB — PREPARE RBC (CROSSMATCH)

## 2023-07-24 MED ORDER — SODIUM ZIRCONIUM CYCLOSILICATE 10 G PO PACK
10.0000 g | PACK | Freq: Every day | ORAL | Status: AC
  Administered 2023-07-24 – 2023-07-25 (×2): 10 g via ORAL
  Filled 2023-07-24 (×3): qty 1

## 2023-07-24 MED ORDER — SODIUM CHLORIDE 0.9% IV SOLUTION: Freq: Once | INTRAVENOUS | Status: AC

## 2023-07-24 NOTE — Progress Notes (Signed)
 PROGRESS NOTE    Ethan Vega.  WUJ:[id number] DOB: June 01, 1945 DOA: 07/10/2023 PCP: Ethan Daughters, MD  126A/126A-AA  LOS: 14 days   Brief hospital course:   Assessment & Plan: Ethan Vega is a 78 year old male with a history of essential hypertension, nonischemic cardiomyopathy history of paroxysmal A-fib and ventricular tachycardia, dyslipidemia who was seen earlier today with electrophysiology service to have ICD generator change however he was noted to have open sores on his left lower extremity and was mentating with confusion and encephalopathy with decision to abort procedure and reschedule. He was subsequently sent to the ER. His workup revealed severe AKI and leukocytosis and vitals with shock physiology. Patient received IV fluid resuscitation but despite 3 L of intravenous fluids continues to require Levophed  support. He reports diarrhea over the past week. Patient currently lives alone in his home and was previously able to perform all activities of daily living. He is unaware of the open score and cellulitic lesion of his left foot.    Upper GI bleed 2/2 gastric and duodenal ulcer.  --multiple dark bloody BM's today. --EGD today found clean based gastric and duodenal ulcers. --advance to soft diet --cont IV PPI BID  Hypotension --cont midodrine   Acute blood loss anemia --1u pRBC today for Hgb 7.4  Septic shock secondary to left foot infection. Left foot ulcer with cellulitis. Elevated troponin secondary to septic shock. Adrenal insufficiency. Patient had received IV fluid bolus, he also received Levophed .  Currently off Levophed , blood pressure is borderline low, added midodrine .  Also check cortisol level. Blood Culture has no growth. Patient was treated with vancomycin , completed dose.  Then on Zosyn . CT scan of the foot did not show any bony involvement. He was seen by podiatry, no need for debridement.  I will continue oral antibiotics with Augmentin  for  additional 3 days to complete 10-day course.  Condition seems to be improved. Patient low blood pressure was also associated with adrenal insufficiency, cortisol level in the morning was only 3.5, started prednisone  5 mg daily. --cont steroid   Paroxysmal atrial fibrillation. Nonsustained ventricular tachycardia. Nonischemic cardiomyopathy with chronic systolic congestive heart failure with EF 30-35%. --hold anticoagulation due to GI bleed   Failure to thrive. Anorexia. Chronic diarrhea. No longer has diarrhea on Creon . Also started Protonix , megestrol . Patient condition finally improving, appetite is better.    Acute kidney injury chronic kidney disease stage IIIa. Hyponatremia. Followed by nephrology, briefly needing dialysis.  Renal function had improved.   Received Lokelma and IV sodium bicarb, and Veltassa.   --monitor   Hyperkalemia --start lokelma 10 g daily for 3 days  Acute metabolic encephalopathy. Chronic dementia. Condition appears to be stable.   Debility. PT/OT recommended nursing home placement.   Class 1 obesity. BMI 32.77 Patient currently has a very poor appetite.   Pressure ulcer POA Pressure Injury 07/11/23 Buttocks Left Stage 2 -  Partial thickness loss of dermis presenting as a shallow open injury with a red, pink wound bed without slough. (Active)  07/11/23 1049  Location: Buttocks  Location Orientation: Left  Staging: Stage 2 -  Partial thickness loss of dermis presenting as a shallow open injury with a red, pink wound bed without slough.  Wound Description (Comments):   Present on Admission: Yes     Pressure Injury 07/11/23 Buttocks Medial Stage 2 -  Partial thickness loss of dermis presenting as a shallow open injury with a red, pink wound bed without slough. (Active)  07/11/23 1100  Location:  Buttocks  Location Orientation: Medial  Staging: Stage 2 -  Partial thickness loss of dermis presenting as a shallow open injury with a red, pink wound  bed without slough.  Wound Description (Comments):   Present on Admission: Yes    DVT prophylaxis: SCD/Compression stockings Code Status: DNR  Family Communication:  Level of care: Med-Surg Dispo:   The patient is from: home Anticipated d/c is to: SNF rehab Anticipated d/c date is: 1-2 days medical ready   Subjective and Interval History:  No bloody BM today.  Pt denied dyspnea.   Objective: Vitals:   07/24/23 1257 07/24/23 1345 07/24/23 1520 07/24/23 1947  BP: (!) 95/50 (!) 86/57 (!) 94/56 99/63  Pulse: 88 92 89 86  Resp:   17 17  Temp: 98.9 F (37.2 C)  98.2 F (36.8 C) 97.8 F (36.6 C)  TempSrc: Oral  Oral Oral  SpO2: 100%  95% 99%  Weight:      Height:        Intake/Output Summary (Last 24 hours) at 07/24/2023 2035 Last data filed at 07/24/2023 1257 Gross per 24 hour  Intake 1542.01 ml  Output --  Net 1542.01 ml   Filed Weights   07/22/23 0500 07/23/23 0500 07/24/23 0425  Weight: 87.4 kg 85.6 kg 85.7 kg    Examination:   Constitutional: NAD, alert, oriented to person and place HEENT: conjunctivae and lids normal, EOMI CV: No cyanosis.   RESP: normal respiratory effort, on RA Extremities: swelling in left UE and both LE's SKIN: warm, dry   Data Reviewed: I have personally reviewed labs and imaging studies  Time spent: 50 minutes  Ethan Kanner, MD [facility] If 7PM-7AM, please contact night-coverage 07/24/2023, 8:35 PM

## 2023-07-24 NOTE — Progress Notes (Signed)
 Physical Therapy Treatment Patient Details Name: Ethan Vega. MRN: [id number] DOB: November 11, 1945 Today's Date: 07/24/2023   History of Present Illness Patient is a 78 year old male with renal failure, toxic metabolic encephalopathy, septic shock with LE cellulitis. Required CRRT and pressor support. History of HTN, atrial fibrillation, ventricular tachycardia, AICD placement, dementia.    PT Comments  Patient alert, agreeable to PT/OT motivated to move. Pt does become very anxious with all mobility attempts, very fearful of falling, somewhat re-directable. Bed placed in chair position to assess BP, still soft but MAP >65 (throughout mobility) and pt without complaints of dizziness/lightheadedness. Sit <> stand from elevated EOB twice with RW and maxAX2, performed two more times with stedy, maxAx2. Pt able to clear buttocks from bed but unable to come up fully into standing though good effort put out (limited by anxiety). Returned to supine maxAx2 and in chair position at end of session. The patient would benefit from further skilled PT intervention to continue to progress towards goals.      If plan is discharge home, recommend the following: A lot of help with walking and/or transfers;A lot of help with bathing/dressing/bathroom;Assistance with cooking/housework;Assistance with feeding;Direct supervision/assist for medications management;Direct supervision/assist for financial management;Assist for transportation;Help with stairs or ramp for entrance;Supervision due to cognitive status   Can travel by private vehicle     No  Equipment Recommendations  None recommended by PT    Recommendations for Other Services       Precautions / Restrictions Precautions Precautions: Fall Recall of Precautions/Restrictions: Impaired Restrictions Weight Bearing Restrictions Per Provider Order: No     Mobility  Bed Mobility Overal bed mobility: Needs Assistance Bed Mobility: Supine to Sit, Sit to  Supine     Supine to sit: Max assist, HOB elevated, Used rails, +2 for safety/equipment Sit to supine: Max assist, HOB elevated, Used rails, +2 for physical assistance   General bed mobility comments: from chair position in bed    Transfers Overall transfer level: Needs assistance Equipment used: Rolling walker (2 wheels) Transfers: Sit to/from Stand Sit to Stand: Max assist, +2 safety/equipment, From elevated surface           General transfer comment: 2 standing attempts with RW maxAx2, another two attempts with stedy. able to clear buttocks from bed but unable to stand fully up    Ambulation/Gait                   Stairs             Wheelchair Mobility     Tilt Bed    Modified Rankin (Stroke Patients Only)       Balance Overall balance assessment: Needs assistance Sitting-balance support: Feet supported, Bilateral upper extremity supported Sitting balance-Leahy Scale: Fair Sitting balance - Comments: anxious throughout     Standing balance-Leahy Scale: Zero                              Communication    Cognition Arousal: Alert Behavior During Therapy: WFL for tasks assessed/performed, Anxious   PT - Cognitive impairments: History of cognitive impairments, No apparent impairments                       PT - Cognition Comments: Pt with hx of dementia, oriented to self, very fearful of falling Following commands: Impaired Following commands impaired: Follows one step commands with increased time  Cueing Cueing Techniques: Verbal cues, Tactile cues, Gestural cues, Visual cues  Exercises      General Comments        Pertinent Vitals/Pain Pain Assessment Pain Assessment: Faces Faces Pain Scale: Hurts little more Pain Location: BLE feet Pain Descriptors / Indicators: Aching, Grimacing, Moaning, Sore Pain Intervention(s): Limited activity within patient's tolerance, Monitored during session, Repositioned     Home Living                          Prior Function            PT Goals (current goals can now be found in the care plan section) Progress towards PT goals: Progressing toward goals    Frequency    Min 2X/week      PT Plan      Co-evaluation PT/OT/SLP Co-Evaluation/Treatment: Yes Reason for Co-Treatment: To address functional/ADL transfers;For patient/therapist safety;Necessary to address cognition/behavior during functional activity PT goals addressed during session: Mobility/safety with mobility OT goals addressed during session: ADL's and self-care      AM-PAC PT "6 Clicks" Mobility   Outcome Measure  Help needed turning from your back to your side while in a flat bed without using bedrails?: Total Help needed moving from lying on your back to sitting on the side of a flat bed without using bedrails?: Total Help needed moving to and from a bed to a chair (including a wheelchair)?: Total Help needed standing up from a chair using your arms (e.g., wheelchair or bedside chair)?: Total Help needed to walk in hospital room?: Total Help needed climbing 3-5 steps with a railing? : Total 6 Click Score: 6    End of Session Equipment Utilized During Treatment: Gait belt Activity Tolerance: Patient tolerated treatment well;Other (comment) (limited by anxiety) Patient left: in bed;with call bell/phone within reach;with nursing/sitter in room (in chair position) Nurse Communication: Mobility status PT Visit Diagnosis: Unsteadiness on feet (R26.81);Muscle weakness (generalized) (M62.81);Other abnormalities of gait and mobility (R26.89);Difficulty in walking, not elsewhere classified (R26.2)     Time: 1308-6578 PT Time Calculation (min) (ACUTE ONLY): 36 min  Charges:    $Therapeutic Activity: 8-22 mins PT General Charges $$ ACUTE PT VISIT: 1 Visit                     Darien Eden PT, DPT 3:03 PM,07/24/23

## 2023-07-24 NOTE — Progress Notes (Signed)
 GI Inpatient Follow-up Note  Subjective:  Patient seen and doing well. No more bowel movements. Hemoglobin has drifted down.  Scheduled Inpatient Medications:   vitamin C   500 mg Oral BID   collagenase    Topical Daily   feeding supplement  237 mL Oral TID BM   levothyroxine   75 mcg Oral Q0600   lidocaine   1 patch Transdermal Q24H   lipase/protease/amylase  24,000 Units Oral TID AC   liver oil-zinc  oxide   Topical BID   megestrol   400 mg Oral Daily   midodrine   10 mg Oral TID WC   multivitamin with minerals  1 tablet Oral Daily   pantoprazole  (PROTONIX ) IV  40 mg Intravenous Q12H   pneumococcal 20-valent conjugate vaccine  0.5 mL Intramuscular Tomorrow-1000   predniSONE   5 mg Oral Q breakfast   QUEtiapine   25 mg Oral QHS   sodium zirconium cyclosilicate  10 g Oral Daily    Continuous Inpatient Infusions:    PRN Inpatient Medications:  acetaminophen , docusate sodium , mouth rinse  Review of Systems:  Review of Systems  Constitutional:  Positive for malaise/fatigue. Negative for chills and fever.  Respiratory:  Negative for shortness of breath.   Cardiovascular:  Positive for leg swelling.  Gastrointestinal:  Negative for abdominal pain, blood in stool, melena and nausea.  Musculoskeletal:  Positive for joint pain.  Skin:  Negative for rash.  Psychiatric/Behavioral:  Negative for substance abuse.   All other systems reviewed and are negative.     Physical Examination: BP (!) 95/50 (BP Location: Left Arm)   Pulse 88   Temp 98.9 F (37.2 C) (Oral)   Resp 18   Ht 5\' 3"  (1.6 m)   Wt 85.7 kg   SpO2 100%   BMI 33.47 kg/m  Gen: NAD, alert and oriented x 4 HEENT: PEERLA, EOMI, Neck: supple, no JVD or thyromegaly Chest: No respiratory distress Abd: soft, non-tender, non-distended Ext: 1+ edema Skin: no rash or lesions noted Lymph: no LAD  Data: Lab Results  Component Value Date   WBC 14.5 (H) 07/24/2023   HGB 7.4 (L) 07/24/2023   HCT 22.3 (L) 07/24/2023   MCV  99.6 07/24/2023   PLT 133 (L) 07/24/2023   Recent Labs  Lab 07/23/23 0841 07/23/23 1733 07/24/23 0523  HGB 10.1* 8.6* 7.4*   Lab Results  Component Value Date   NA 137 07/24/2023   K 5.3 (H) 07/24/2023   CL 108 07/24/2023   CO2 21 (L) 07/24/2023   BUN 88 (H) 07/24/2023   CREATININE 1.84 (H) 07/24/2023   Lab Results  Component Value Date   ALT 39 07/17/2023   AST 31 07/17/2023   ALKPHOS 41 07/17/2023   BILITOT 1.5 (H) 07/17/2023   Recent Labs  Lab 07/23/23 1733  INR 1.1   Assessment/Plan: Ethan Vega is a 78 y.o. gentleman with history of a. Fib on DOAC, HLD, hypertension, and HFpEF who presented with sepsis presumed due to lower extremity wound and found to have PUD. No treatment required due to clean ulcer base.  Recommendations:  - maintain active type and screen - trend hemoglobins - transfuse for hemoglobin < 7 - IV PPI BID - monitor for overt GI bleeding, he might have some residual dark stool - ok to advance diet from a GI perspective - in terms of restarting his DOAC, would discuss risks/benefit of restarting but if decision is made to restart, the earliest I would restart is 3 days from now - some lower extremity swelling,  consider dopplers to rule out any clot given use of Ethan Franco MD, MPH [facility]

## 2023-07-24 NOTE — Progress Notes (Signed)
 Occupational Therapy Treatment Patient Details Name: Ethan Vega. MRN: [id number] DOB: 03-02-1946 Today's Date: 07/24/2023   History of present illness Patient is a 78 year old male with renal failure, toxic metabolic encephalopathy, septic shock with LE cellulitis. Required CRRT and pressor support. History of HTN, atrial fibrillation, ventricular tachycardia, AICD placement, dementia.   OT comments  Pt seen for OT treatment on this date. Upon arrival to room pt resting in bed, agreeable to tx. Throughout each position change and while static sitting on the EOB pt was shaking and endorsing how scared he is of falling. Pt required frequent reassurance. Pt bed placed in chair position to assess BP; still soft but MAP >65 (throughout mobility) and asymptotic throughout. Pt completed 4 STS attempts from elevated EOB; 2 attempts with RW other 2 with sara stedy, MAXA +2 for all attempts. Pt is very limited by anxiety during all mobility attempts. Declined ADL tasks due to recent bath/oral care. MAXA for LB dressing at bed level. At the end of session pt returned to supine MAXA+2 for trunk/LE support. Pt making good progress toward goals, will continue to follow POC. Discharge recommendation remains appropriate.        If plan is discharge home, recommend the following:  A lot of help with walking and/or transfers;A lot of help with bathing/dressing/bathroom;Supervision due to cognitive status;Assist for transportation;Help with stairs or ramp for entrance   Equipment Recommendations  Other (comment)    Recommendations for Other Services      Precautions / Restrictions Precautions Precautions: Fall Recall of Precautions/Restrictions: Impaired Precaution/Restrictions Comments: right femoral temporary triple lumen Restrictions Weight Bearing Restrictions Per Provider Order: No       Mobility Bed Mobility Overal bed mobility: Needs Assistance Bed Mobility: Supine to Sit, Sit to Supine      Supine to sit: Max assist, HOB elevated, Used rails, +2 for safety/equipment Sit to supine: Max assist, HOB elevated, Used rails, +2 for physical assistance   General bed mobility comments: from chair position in bed    Transfers Overall transfer level: Needs assistance Equipment used: Rolling walker (2 wheels) Transfers: Sit to/from Stand Sit to Stand: Max assist, +2 safety/equipment, From elevated surface           General transfer comment: Pt attempted standing from EOB with RW, 2 with sara stedy; MaxAx2. Unable to come to full stance, cleared buttocks each standing attempt     Balance Overall balance assessment: Needs assistance Sitting-balance support: Feet supported, Bilateral upper extremity supported Sitting balance-Leahy Scale: Fair     Standing balance support: Bilateral upper extremity supported, During functional activity, Reliant on assistive device for balance Standing balance-Leahy Scale: Zero                             ADL either performed or assessed with clinical judgement   ADL Overall ADL's : Needs assistance/impaired                     Lower Body Dressing: Maximal assistance;Bed level Lower Body Dressing Details (indicate cue type and reason): donn socks, doff/donn prevlon boots             Functional mobility during ADLs: Maximal assistance;+2 for safety/equipment;Rolling walker (2 wheels);Cueing for sequencing;Cueing for safety General ADL Comments: MAXA don/doff bilateral socks and prevlon boots bed level, declined ADL tasks due to previous bath, set up for routine teeth brusing    Communication Communication  Communication: No apparent difficulties   Cognition Arousal: Alert Behavior During Therapy: WFL for tasks assessed/performed, Anxious Cognition: Cognition impaired           Executive functioning impairment (select all impairments): Reasoning, Problem solving OT - Cognition Comments: Seem high level of  anxiousness impacts his thinking and sequencing                 Following commands: Impaired Following commands impaired: Follows one step commands with increased time      Cueing   Cueing Techniques: Verbal cues, Tactile cues, Gestural cues, Visual cues  Exercises Exercises: Other exercises Other Exercises Other Exercises: Edu: benefits of mobility/chair position in bed, handplacement throughout transfers, calming techniques Other Exercises: UB/LB bed level exercises, 10 reps per extermity    Shoulder Instructions       General Comments Pt continues to be a high fall risk, extreme anxiety with sitting on EOB and standing attempts. Pt shakes throughout from fear of falling. RN notified of events.    Pertinent Vitals/ Pain       Pain Assessment Pain Assessment: Faces Faces Pain Scale: Hurts little more Pain Location: BLE feet Pain Descriptors / Indicators: Aching, Grimacing, Moaning, Sore Pain Intervention(s): Limited activity within patient's tolerance, Repositioned  Home Living                                          Prior Functioning/Environment              Frequency  Min 2X/week        Progress Toward Goals  OT Goals(current goals can now be found in the care plan section)  Progress towards OT goals: Progressing toward goals  Acute Rehab OT Goals Patient Stated Goal: to figure out what is going on OT Goal Formulation: With patient Time For Goal Achievement: 07/30/23 Potential to Achieve Goals: Fair ADL Goals Pt Will Perform Grooming: with supervision;sitting Pt Will Perform Lower Body Dressing: with min assist;sitting/lateral leans;sit to/from stand Pt Will Transfer to Toilet: with min assist;ambulating Pt Will Perform Toileting - Clothing Manipulation and hygiene: with min assist;sitting/lateral leans;sit to/from stand  Plan      Co-evaluation    PT/OT/SLP Co-Evaluation/Treatment: Yes Reason for Co-Treatment: To address  functional/ADL transfers;For patient/therapist safety;Necessary to address cognition/behavior during functional activity PT goals addressed during session: Mobility/safety with mobility OT goals addressed during session: ADL's and self-care      AM-PAC OT "6 Clicks" Daily Activity     Outcome Measure   Help from another person eating meals?: A Little Help from another person taking care of personal grooming?: A Lot Help from another person toileting, which includes using toliet, bedpan, or urinal?: A Lot Help from another person bathing (including washing, rinsing, drying)?: A Lot Help from another person to put on and taking off regular upper body clothing?: A Little Help from another person to put on and taking off regular lower body clothing?: Total 6 Click Score: 13    End of Session Equipment Utilized During Treatment: Gait belt;Rolling walker (2 wheels)  OT Visit Diagnosis: Other abnormalities of gait and mobility (R26.89);Muscle weakness (generalized) (M62.81)   Activity Tolerance Patient tolerated treatment well   Patient Left in bed;with call bell/phone within reach;with bed alarm set   Nurse Communication Mobility status;Other (comment) (Pt anxiety levels during session)        Time: 1308-6578 OT Time  Calculation (min): 37 min  Charges: OT General Charges $OT Visit: 1 Visit OT Treatments $Therapeutic Activity: 8-22 mins  Rosaria Common M.S. OTR/L  07/24/23, 3:39 PM

## 2023-07-24 NOTE — Progress Notes (Signed)
 Palliative Care Progress Note, Assessment & Plan   Patient Name: Ethan Vega.       Date: 07/24/2023 DOB: 1946-02-23  Age: 78 y.o. MRN#: [id number] Attending Physician: Garrison Kanner, MD Primary Care Physician: Shari Daughters, MD Admit Date: 07/10/2023  Subjective: Patient is lying in bed receiving a full bath.  Nurse and nurse tech are at bedside.  No family at bedside during my visit.  HPI: 78 y.o. male  with past medical history of  essential hypertension, nonischemic cardiomyopathy history of paroxysmal A-fib and ventricular tachycardia, dyslipidemia. He presented to hospital 07/10/23 to have ICD generator changed. Staff found him to have open wounds to LLE, confusion and hypotension. He was subsequently sent to ED for evaluation. ED workup found patient to have AKI, leukocytosis and hypotension with creatinine 5.5 (baseline 1.7), Na+ 134, K+ 5.4, BUN 151, BNP 672.1, Trop 164, Lactic 2.2 and WBC 16.1.   He received IV fluid resuscitation but required pressor support. Daughter at bedside reported that patient lives alone, performing his ADLs independently. She adds that he complained of diarrhea and poor PO intake 2 weeks prior. Pt was unaware of the open sore to his LLE but did confirm chronic lower extremity pitting edema. He was admitted to ICU for circulatory shock with AKI.    Palliative care was consulted to discuss goals of care.  Summary of counseling/coordination of care: Extensive chart review completed prior to meeting patient including labs, vital signs, imaging, progress notes, orders, and available advanced directive documents from current and previous encounters.   After reviewing the patient's chart and assessing the patient at bedside, I spoke with patient and RN in regards to symptom  management.  Patient has no acute complaints today.  He denies pain, discomfort, or other other acute ailments.  No adjustment to [facility] needed.  After visiting with the patient, I attempted to speak with patient's daughter Ethan Vega over the phone.  Number provided in chart was "unable to be completed as dialed" x 2.  I will attempt to speak with family at a later date/time and continue goals of care discussions.  Physical Exam Vitals reviewed.  Constitutional:      General: He is not in acute distress.    Appearance: He is obese.  HENT:     Head: Normocephalic.     Mouth/Throat:     Mouth: Mucous membranes are moist.  Eyes:     Pupils: Pupils are equal, round, and reactive to light.  Musculoskeletal:     Comments: MAETC, generalized weakness  Skin:    General: Skin is warm and dry.  Neurological:     Mental Status: He is alert.  Psychiatric:        Mood and Affect: Mood normal.        Behavior: Behavior normal.        Judgment: Judgment normal.             Total Time 25 minutes   Time spent includes: Detailed review of medical records (labs, imaging, vital signs), medically appropriate exam (mental status, respiratory, cardiac, skin), discussed with treatment team, counseling and educating patient, family and staff, documenting clinical information, medication management and coordination of  care.  Ethan Vega. Ethan Campi, DNP, FNP-BC Palliative Medicine Team

## 2023-07-24 NOTE — Plan of Care (Signed)

## 2023-07-25 ENCOUNTER — Inpatient Hospital Stay

## 2023-07-25 DIAGNOSIS — N179 Acute kidney failure, unspecified: Secondary | ICD-10-CM | POA: Diagnosis not present

## 2023-07-25 DIAGNOSIS — R6521 Severe sepsis with septic shock: Secondary | ICD-10-CM | POA: Diagnosis not present

## 2023-07-25 DIAGNOSIS — L03116 Cellulitis of left lower limb: Secondary | ICD-10-CM | POA: Diagnosis not present

## 2023-07-25 DIAGNOSIS — N171 Acute kidney failure with acute cortical necrosis: Secondary | ICD-10-CM | POA: Diagnosis not present

## 2023-07-25 DIAGNOSIS — A419 Sepsis, unspecified organism: Secondary | ICD-10-CM | POA: Diagnosis not present

## 2023-07-25 LAB — BASIC METABOLIC PANEL WITH GFR
Anion gap: 11 (ref 5–15)
BUN: 82 mg/dL — ABNORMAL HIGH (ref 8–23)
CO2: 20 mmol/L — ABNORMAL LOW (ref 22–32)
Calcium: 8.6 mg/dL — ABNORMAL LOW (ref 8.9–10.3)
Chloride: 105 mmol/L (ref 98–111)
Creatinine, Ser: 1.68 mg/dL — ABNORMAL HIGH (ref 0.61–1.24)
GFR, Estimated: 41 mL/min — ABNORMAL LOW (ref >60)
Glucose, Bld: 101 mg/dL — ABNORMAL HIGH (ref 70–99)
Potassium: 4.6 mmol/L (ref 3.5–5.1)
Sodium: 136 mmol/L (ref 135–145)

## 2023-07-25 LAB — MAGNESIUM: Magnesium: 2 mg/dL (ref 1.7–2.4)

## 2023-07-25 LAB — CBC
HCT: 21.2 % — ABNORMAL LOW (ref 39.0–52.0)
Hemoglobin: 7.2 g/dL — ABNORMAL LOW (ref 13.0–17.0)
MCH: 32.6 pg (ref 26.0–34.0)
MCHC: 34 g/dL (ref 30.0–36.0)
MCV: 95.9 fL (ref 80.0–100.0)
Platelets: 121 10*3/uL — ABNORMAL LOW (ref 150–400)
RBC: 2.21 MIL/uL — ABNORMAL LOW (ref 4.22–5.81)
RDW: 18.6 % — ABNORMAL HIGH (ref 11.5–15.5)
WBC: 13 10*3/uL — ABNORMAL HIGH (ref 4.0–10.5)
nRBC: 0.5 % — ABNORMAL HIGH (ref 0.0–0.2)

## 2023-07-25 LAB — PREPARE RBC (CROSSMATCH)

## 2023-07-25 LAB — ABO/RH: ABO/RH(D): O POS

## 2023-07-25 LAB — HEMOGLOBIN: Hemoglobin: 6.4 g/dL — ABNORMAL LOW (ref 13.0–17.0)

## 2023-07-25 MED ORDER — SODIUM CHLORIDE 0.9% IV SOLUTION: Freq: Once | INTRAVENOUS | Status: AC

## 2023-07-25 MED ORDER — IOHEXOL 350 MG/ML SOLN
75.0000 mL | Freq: Once | INTRAVENOUS | Status: AC | PRN
  Administered 2023-07-25: 75 mL via INTRAVENOUS

## 2023-07-25 NOTE — Plan of Care (Signed)

## 2023-07-25 NOTE — Significant Event (Signed)
       CROSS COVER NOTE  NAME: Ethan Vega. MRN: [id number] DOB : 1945-03-26 ATTENDING PHYSICIAN: Garrison Kanner, MD    Date of Service   07/25/2023   HPI/Events of Note   Bright red blood per rectum  Has hx significant ongoing marroon stools ongoing problem   Interventions   Assessment/Plan:    07/25/2023   10:32 PM 07/25/2023    9:01 PM 07/25/2023    3:40 PM  Vitals with BMI  Systolic 98 108 98  Diastolic 60 64 55  Pulse 65 75 82   Afebrile Pale, slightly tachypnic. Hemodynamically stable No abd pain CTA GI Bleed study negative; Denies abdominal pain  Latest Reference Range & Units 07/26/23 03:41  pH, Ven 7.25 - 7.43  7.29  pCO2, Ven 44 - 60 mmHg 35 (L)  pO2, Ven 32 - 45 mmHg PENDING  Acid-base deficit 0.0 - 2.0 mmol/L 8.9 (H)  Bicarbonate 20.0 - 28.0 mmol/L 16.8 (L)  O2 Saturation % PENDING  Patient temperature  37.0  Collection site  VEIN    Latest Reference Range & Units 07/25/23 04:35 07/25/23 23:10 07/26/23 03:41  WBC 4.0 - 10.5 K/uL 13.0 (H)  23.3 (H)  RBC 4.22 - 5.81 MIL/uL 2.21 (L)  2.72 (L)  Hemoglobin 13.0 - 17.0 g/dL 7.2 (L) 6.4 (L) 8.5 (L)  HCT 39.0 - 52.0 % 21.2 (L)  25.7 (L)  MCV 80.0 - 100.0 fL 95.9  94.5  MCH 26.0 - 34.0 pg 32.6  31.3  MCHC 30.0 - 36.0 g/dL 40.9  81.1  RDW 91.4 - 15.5 % 18.6 (H)  17.5 (H)  Platelets 150 - 400 K/uL 121 (L)  103 (L)  nRBC 0.0 - 0.2 % 0.5 (H)  2.7 (H)   Transfuse 2 units PRBC Low threshold for transfer to stepdown if becomes hemodynamically unstbile         Kip Peon NP [facility] Cross Cover 7pm-7am - check amion for availability Pager [phone]

## 2023-07-25 NOTE — Progress Notes (Signed)
 Palliative Care Progress Note, Assessment & Plan   Patient Name: Ethan Vega.       Date: 07/25/2023 DOB: 1945-10-24  Age: 78 y.o. MRN#: [id number] Attending Physician: Ethan Kanner, MD Primary Care Physician: Ethan Daughters, MD Admit Date: 07/10/2023  Subjective: In bed, eating his lunch.  He acknowledges my presence, is able to make his wishes known.  No family or friends present during my visit.  HPI: 78 y.o. male  with past medical history of  essential hypertension, nonischemic cardiomyopathy history of paroxysmal A-fib and ventricular tachycardia, dyslipidemia. He presented to hospital 07/10/23 to have ICD generator changed. Staff found him to have open wounds to LLE, confusion and hypotension. He was subsequently sent to ED for evaluation. ED workup found patient to have AKI, leukocytosis and hypotension with creatinine 5.5 (baseline 1.7), Na+ 134, K+ 5.4, BUN 151, BNP 672.1, Trop 164, Lactic 2.2 and WBC 16.1.   He received IV fluid resuscitation but required pressor support. Daughter at bedside reported that patient lives alone, performing his ADLs independently. She adds that he complained of diarrhea and poor PO intake 2 weeks prior. Pt was unaware of the open sore to his LLE but did confirm chronic lower extremity pitting edema. He was admitted to ICU for circulatory shock with AKI.    Palliative care was consulted to discuss goals of care.  Summary of counseling/coordination of care: Extensive chart review completed prior to meeting patient including labs, vital signs, imaging, progress notes, orders, and available advanced directive documents from current and previous encounters.   After reviewing the patient's chart and assessing the patient at bedside, I spoke with patient in regards to  symptom management and goals of care.   Symptoms assessed.  Patient has no acute complaints at this time.  He denies headache, chest pain, nausea/vomiting/diarrhea, difficulty with urination, tingling or numbness sensation.  No adjustment to [facility] needed at this time.  Discussed that next steps include patient likely transferring to a skilled nursing facility for rehabilitation.  Patient in agreement.  Given patient will likely transfer to an outside medical facility, I reduced the concept of a medical order for scope of treatment (MOST).  Reviewed this is another layer of advance care planning documentation to make patient's wishes known.  He was appreciative and said he would like to review it.  After meeting with the patient, I spoke with his daughter Ethan Vega over the phone.  Brief medical update given.  Also introduced concept of MOST form.  Ethan Vega was appreciative of this additional layer of advanced care planning.  She shares she will be at the hospital this afternoon and review the paperwork with her father at that time.  She also was requesting a phone call or contact from attending.  Attending made aware of daughter's request to hear from her.  No further acute palliative needs at this time.  PMT will step back from daily visits. Please reengage with PMT if goals change, at patient/family's request, or if patient's health deteriorates during this hospitalization.  Physical Exam Vitals reviewed.  Constitutional:      General: He is not in acute distress.    Appearance: He is obese.  HENT:  Head: Normocephalic.     Nose: Nose normal.  Eyes:     Pupils: Pupils are equal, round, and reactive to light.  Pulmonary:     Effort: Pulmonary effort is normal.  Abdominal:     Palpations: Abdomen is soft.  Musculoskeletal:     Comments: MAETC, generalized weakness  Skin:    General: Skin is warm and dry.  Neurological:     Mental Status: He is alert.     Comments: Oriented to person, place,  situation  Psychiatric:        Mood and Affect: Mood normal.        Behavior: Behavior normal.        Thought Content: Thought content normal.        Judgment: Judgment normal.             Total Time 50 minutes   Time spent includes: Detailed review of medical records (labs, imaging, vital signs), medically appropriate exam (mental status, respiratory, cardiac, skin), discussed with treatment team, counseling and educating patient, family and staff, documenting clinical information, medication management and coordination of care.  Ethan Nose L. Rebbeca Campi, DNP, FNP-BC Palliative Medicine Team

## 2023-07-25 NOTE — Progress Notes (Signed)
   07/25/23 0960  Provider Notification  Provider Name/Title Elisabeth Guild, NP  Date Provider Notified 07/25/23  Time Provider Notified [phone]  Method of Notification Page (Secure Chat)  Notification Reason Other (Comment) (hgb 7.2 s/p blood transfusion on 07/24/23)

## 2023-07-25 NOTE — Progress Notes (Addendum)
 PROGRESS NOTE    Ethan Lex.  ZOX:[id number] DOB: [birth year] DOA: 07/10/2023 PCP: Ethan Daughters, MD  126A/126A-AA  LOS: 15 days   Brief hospital course:   Assessment & Plan: Ethan Vega is a 78 year old male with a history of essential hypertension, nonischemic cardiomyopathy history of paroxysmal A-fib and ventricular tachycardia, dyslipidemia who was seen earlier today with electrophysiology service to have ICD generator change however he was noted to have open sores on his left lower extremity and was mentating with confusion and encephalopathy with decision to abort procedure and reschedule. He was subsequently sent to the ER. His workup revealed severe AKI and leukocytosis and vitals with shock physiology. Patient received IV fluid resuscitation but despite 3 L of intravenous fluids continues to require Levophed  support. He reports diarrhea over the past week. Patient currently lives alone in his home and was previously able to perform all activities of daily living. He is unaware of the open score and cellulitic lesion of his left foot.    Upper GI bleed 2/2 gastric and duodenal ulcer.  --multiple dark bloody BM's today. --EGD found clean based gastric and duodenal ulcers. --cont soft diet --cont IV PPI BID  Eliquis  contributed to GI bleed   Hypotension --cont midodrine   Acute blood loss anemia --1u pRBC for Hgb 7.4 --2nd unit of pRBC today for Hgb 7.2  Septic shock secondary to left foot infection. Left foot ulcer with cellulitis. Elevated troponin secondary to septic shock. Adrenal insufficiency. Patient had received IV fluid bolus, he also received Levophed .  Currently off Levophed , blood pressure is borderline low, added midodrine .  Also check cortisol level. Blood Culture has no growth. Patient was treated with vancomycin , completed dose.  Then on Zosyn . CT scan of the foot did not show any bony involvement. He was seen by podiatry, no need for  debridement.  I will continue oral antibiotics with Augmentin  for additional 3 days to complete 10-day course.  Condition seems to be improved. Patient low blood pressure was also associated with adrenal insufficiency, cortisol level in the morning was only 3.5, started prednisone  5 mg daily. --cont steroid   Paroxysmal atrial fibrillation. Nonsustained ventricular tachycardia. Nonischemic cardiomyopathy with chronic systolic congestive heart failure with EF 30-35%. --hold anticoagulation due to GI bleed   Failure to thrive. Anorexia. Chronic diarrhea. No longer has diarrhea on Creon . Also started Protonix , megestrol . Patient condition finally improving, appetite is better.    Acute kidney injury chronic kidney disease stage IIIa. Hyponatremia. Followed by nephrology, briefly needing dialysis.  Renal function had improved.   Received Lokelma  and IV sodium bicarb, and Veltassa .   --monitor   Hyperkalemia --cont Lokelma    Right foot pain --US  DVT studies  Acute metabolic encephalopathy. Chronic dementia. Condition appears to be stable.   Debility. PT/OT recommended nursing home placement.   Class 1 obesity. BMI 32.77 Patient currently has a very poor appetite.   Pressure ulcer POA Pressure Injury 07/11/23 Buttocks Left Stage 2 -  Partial thickness loss of dermis presenting as a shallow open injury with a red, pink wound bed without slough. (Active)  07/11/23 1049  Location: Buttocks  Location Orientation: Left  Staging: Stage 2 -  Partial thickness loss of dermis presenting as a shallow open injury with a red, pink wound bed without slough.  Wound Description (Comments):   Present on Admission: Yes     Pressure Injury 07/11/23 Buttocks Medial Stage 2 -  Partial thickness loss of dermis presenting as a shallow open injury  with a red, pink wound bed without slough. (Active)  07/11/23 1100  Location: Buttocks  Location Orientation: Medial  Staging: Stage 2 -  Partial  thickness loss of dermis presenting as a shallow open injury with a red, pink wound bed without slough.  Wound Description (Comments):   Present on Admission: Yes    DVT prophylaxis: SCD/Compression stockings Code Status: DNR  Family Communication: daughter updated on the phone today Level of care: Med-Surg Dispo:   The patient is from: home Anticipated d/c is to: SNF rehab Anticipated d/c date is: 1-2 days medical ready   Subjective and Interval History:  Daughter said pt complained of right foot pain.   Objective: Vitals:   07/25/23 0809 07/25/23 1245 07/25/23 1304 07/25/23 1540  BP: 96/60 90/68 (!) 94/58 (!) 98/55  Pulse: 85 79 83 82  Resp: 18 15 16 16   Temp: 98.3 F (36.8 C) 98.6 F (37 C) 98.6 F (37 C) 98.1 F (36.7 C)  TempSrc:  Oral Oral Oral  SpO2: 98% 100% 100% 100%  Weight:      Height:        Intake/Output Summary (Last 24 hours) at 07/25/2023 1852 Last data filed at 07/25/2023 1534 Gross per 24 hour  Intake 577 ml  Output --  Net 577 ml   Filed Weights   07/23/23 0500 07/24/23 0425 07/25/23 0332  Weight: 85.6 kg 85.7 kg 88.1 kg    Examination:   Constitutional: NAD CV: No cyanosis.   RESP: normal respiratory effort, on RA   Data Reviewed: I have personally reviewed labs and imaging studies  Time spent: 35 minutes  Ethan Kanner, MD [facility] If 7PM-7AM, please contact night-coverage 07/25/2023, 6:52 PM

## 2023-07-26 ENCOUNTER — Inpatient Hospital Stay: Admitting: Anesthesiology

## 2023-07-26 ENCOUNTER — Encounter
Admission: EM | Disposition: A | Discharge status: Skilled Nursing Facility | Payer: Self-pay | Source: Home / Self Care | Attending: Hospitalist

## 2023-07-26 ENCOUNTER — Encounter: Payer: Self-pay | Admitting: Pulmonary Disease

## 2023-07-26 DIAGNOSIS — A419 Sepsis, unspecified organism: Secondary | ICD-10-CM | POA: Diagnosis not present

## 2023-07-26 DIAGNOSIS — R6521 Severe sepsis with septic shock: Secondary | ICD-10-CM | POA: Diagnosis not present

## 2023-07-26 HISTORY — PX: ESOPHAGOGASTRODUODENOSCOPY: SHX5428

## 2023-07-26 LAB — BLOOD GAS, VENOUS: Patient temperature: 37

## 2023-07-26 LAB — BASIC METABOLIC PANEL WITH GFR
Anion gap: 13 (ref 5–15)
BUN: 85 mg/dL — ABNORMAL HIGH (ref 8–23)
CO2: 15 mmol/L — ABNORMAL LOW (ref 22–32)
Calcium: 8.3 mg/dL — ABNORMAL LOW (ref 8.9–10.3)
Chloride: 108 mmol/L (ref 98–111)
Creatinine, Ser: 2.03 mg/dL — ABNORMAL HIGH (ref 0.61–1.24)
GFR, Estimated: 33 mL/min — ABNORMAL LOW (ref >60)
Glucose, Bld: 121 mg/dL — ABNORMAL HIGH (ref 70–99)
Potassium: 4.9 mmol/L (ref 3.5–5.1)
Sodium: 136 mmol/L (ref 135–145)

## 2023-07-26 LAB — CBC
HCT: 25.7 % — ABNORMAL LOW (ref 39.0–52.0)
Hemoglobin: 8.5 g/dL — ABNORMAL LOW (ref 13.0–17.0)
MCH: 31.3 pg (ref 26.0–34.0)
MCHC: 33.1 g/dL (ref 30.0–36.0)
MCV: 94.5 fL (ref 80.0–100.0)
Platelets: 103 10*3/uL — ABNORMAL LOW (ref 150–400)
RBC: 2.72 MIL/uL — ABNORMAL LOW (ref 4.22–5.81)
RDW: 17.5 % — ABNORMAL HIGH (ref 11.5–15.5)
WBC: 23.3 10*3/uL — ABNORMAL HIGH (ref 4.0–10.5)
nRBC: 2.7 % — ABNORMAL HIGH (ref 0.0–0.2)

## 2023-07-26 LAB — PREPARE RBC (CROSSMATCH)

## 2023-07-26 LAB — HEMOGLOBIN: Hemoglobin: 10 g/dL — ABNORMAL LOW (ref 13.0–17.0)

## 2023-07-26 SURGERY — EGD (ESOPHAGOGASTRODUODENOSCOPY)
Anesthesia: General

## 2023-07-26 MED ORDER — LIDOCAINE 2% (20 MG/ML) 5 ML SYRINGE
INTRAMUSCULAR | Status: DC | PRN
  Administered 2023-07-26: 20 mg via INTRAVENOUS

## 2023-07-26 MED ORDER — PROPOFOL 500 MG/50ML IV EMUL
INTRAVENOUS | Status: DC | PRN
  Administered 2023-07-26: 100 ug/kg/min via INTRAVENOUS

## 2023-07-26 MED ORDER — LIDOCAINE HCL (PF) 2 % IJ SOLN
INTRAMUSCULAR | Status: AC
  Filled 2023-07-26: qty 5

## 2023-07-26 MED ORDER — HYDROCORTISONE ACETATE 25 MG RE SUPP
25.0000 mg | Freq: Two times a day (BID) | RECTAL | Status: DC
  Administered 2023-07-27 – 2023-07-28 (×3): 25 mg via RECTAL
  Filled 2023-07-26 (×5): qty 1

## 2023-07-26 MED ORDER — PROPOFOL 10 MG/ML IV BOLUS
INTRAVENOUS | Status: DC | PRN
  Administered 2023-07-26: 50 mg via INTRAVENOUS
  Administered 2023-07-26: 20 mg via INTRAVENOUS

## 2023-07-26 MED ORDER — PROPOFOL 10 MG/ML IV BOLUS
INTRAVENOUS | Status: AC
  Filled 2023-07-26: qty 20

## 2023-07-26 MED ORDER — SODIUM CHLORIDE 0.9% IV SOLUTION: Freq: Once | INTRAVENOUS | Status: AC

## 2023-07-26 MED ORDER — SODIUM CHLORIDE 0.9 % IV SOLN: INTRAVENOUS | Status: DC

## 2023-07-26 MED ORDER — SODIUM CHLORIDE 0.9 % IV SOLN: INTRAVENOUS | Status: AC

## 2023-07-26 NOTE — Transfer of Care (Signed)
 Immediate Anesthesia Transfer of Care Note  Patient: Ethan Vega.  Procedure(s) Performed: EGD (ESOPHAGOGASTRODUODENOSCOPY)  Patient Location: Endoscopy Unit  Anesthesia Type:General  Level of Consciousness: awake  Airway & Oxygen Therapy: Patient Spontanous Breathing and Patient connected to nasal cannula oxygen  Post-op Assessment: Report given to RN and Post -op Vital signs reviewed and stable  Post vital signs: Reviewed  Last Vitals:  Vitals Value Taken Time  BP 97/57 07/26/23 1513  Temp    Pulse 89 07/26/23 1513  Resp 24 07/26/23 1513  SpO2 100 % 07/26/23 1513  Vitals shown include unfiled device data.  Last Pain:  Vitals:   07/26/23 1409  TempSrc: Temporal  PainSc: 0-No pain      Patients Stated Pain Goal: 0 (07/26/23 0328)  Complications: No notable events documented.

## 2023-07-26 NOTE — Anesthesia Postprocedure Evaluation (Signed)
 Anesthesia Post Note  Patient: Ethan Vega.  Procedure(s) Performed: EGD (ESOPHAGOGASTRODUODENOSCOPY)  Patient location during evaluation: Endoscopy Anesthesia Type: General Level of consciousness: awake and alert Pain management: pain level controlled Vital Signs Assessment: post-procedure vital signs reviewed and stable Respiratory status: spontaneous breathing, nonlabored ventilation, respiratory function stable and patient connected to nasal cannula oxygen Cardiovascular status: blood pressure returned to baseline and stable Postop Assessment: no apparent nausea or vomiting Anesthetic complications: no   No notable events documented.   Last Vitals:  Vitals:   07/26/23 1530 07/26/23 2101  BP: (!) 98/57 94/63  Pulse:  89  Resp:  16  Temp:  36.9 C  SpO2:  100%    Last Pain:  Vitals:   07/26/23 1510  TempSrc: Temporal  PainSc: 0-No pain                 Vanice Genre

## 2023-07-26 NOTE — Progress Notes (Signed)
 PROGRESS NOTE    Ethan Vega.  ZOX:[id number] DOB: 10-08-1945 DOA: 07/10/2023 PCP: Ethan Daughters, MD  126A/126A-AA  LOS: 16 days   Brief hospital course:   Assessment & Plan: Ethan Vega is a 78 year old male with a history of essential hypertension, nonischemic cardiomyopathy history of paroxysmal A-fib and ventricular tachycardia, dyslipidemia who was seen earlier today with electrophysiology service to have ICD generator change however he was noted to have open sores on his left lower extremity and was mentating with confusion and encephalopathy with decision to abort procedure and reschedule. He was subsequently sent to the ER. His workup revealed severe AKI and leukocytosis and vitals with shock physiology. Patient received IV fluid resuscitation but despite 3 L of intravenous fluids continues to require Levophed  support. He reports diarrhea over the past week. Patient currently lives alone in his home and was previously able to perform all activities of daily living. He is unaware of the open score and cellulitic lesion of his left foot.    Upper GI bleed 2/2 gastric and duodenal ulcer Eliquis  contributed to GI bleed  --EGD found clean based gastric and duodenal ulcers. --more bleeding overnight.  CTA bleeding scan neg.  EGD today found healing ulcers. --monitor, if large amount of fresh bleed out from rectum, will order nuclear bleeding scan. --cont soft diet --cont IV PPI BID  Hypotension --cont midodrine   Acute blood loss anemia --s/p 2u pRBC --2 more units ordered and given today --monitor and transfuse to keep Hgb >8  Septic shock secondary to left foot infection. Left foot ulcer with cellulitis. Elevated troponin secondary to septic shock. Adrenal insufficiency. Patient had received IV fluid bolus, he also received Levophed .  Currently off Levophed , blood pressure is borderline low, added midodrine .  Also check cortisol level. Blood Culture has no  growth. Patient was treated with vancomycin , completed dose.  Then on Zosyn . CT scan of the foot did not show any bony involvement. He was seen by podiatry, no need for debridement.  I will continue oral antibiotics with Augmentin  for additional 3 days to complete 10-day course.  Condition seems to be improved. Patient low blood pressure was also associated with adrenal insufficiency, cortisol level in the morning was only 3.5, started prednisone  5 mg daily. --cont steroid   Paroxysmal atrial fibrillation. Nonsustained ventricular tachycardia. Nonischemic cardiomyopathy with chronic systolic congestive heart failure with EF 30-35%. --hold anticoagulation due to GI bleed   Failure to thrive. Anorexia. Chronic diarrhea. No longer has diarrhea on Creon . Also started Protonix , megestrol . Patient condition finally improving, appetite is better.    Acute kidney injury chronic kidney disease stage IIIa. Hyponatremia. Followed by nephrology, briefly needing dialysis.  Renal function had improved.   Received Lokelma  and IV sodium bicarb, and Veltassa .   --monitor   Hyperkalemia --cont Lokelma  for 3 days  Right foot pain --US  DVT studies neg for DVT in BLE  Acute metabolic encephalopathy. Chronic dementia. Condition appears to be stable.   Debility. PT/OT recommended nursing home placement.   Class 1 obesity. BMI 32.77 Patient currently has a very poor appetite.   Pressure ulcer POA Pressure Injury 07/11/23 Buttocks Left Stage 2 -  Partial thickness loss of dermis presenting as a shallow open injury with a red, pink wound bed without slough. (Active)  07/11/23 1049  Location: Buttocks  Location Orientation: Left  Staging: Stage 2 -  Partial thickness loss of dermis presenting as a shallow open injury with a red, pink wound bed without slough.  Wound Description (Comments):   Present on Admission: Yes     Pressure Injury 07/11/23 Buttocks Medial Stage 2 -  Partial thickness  loss of dermis presenting as a shallow open injury with a red, pink wound bed without slough. (Active)  07/11/23 1100  Location: Buttocks  Location Orientation: Medial  Staging: Stage 2 -  Partial thickness loss of dermis presenting as a shallow open injury with a red, pink wound bed without slough.  Wound Description (Comments):   Present on Admission: Yes    DVT prophylaxis: SCD/Compression stockings Code Status: DNR  Family Communication: daughter updated on the phone today Level of care: Med-Surg Dispo:   The patient is from: home Anticipated d/c is to: SNF rehab Anticipated d/c date is: undetermined   Subjective and Interval History:  RN reported fresh blood per rectum overnight.  Pt was also hypotensive.  2u pRBC ordered and given.  Pt denied abdominal pain.  Complained of pain in his right foot.  EGD today, found healing ulcers.   Objective: Vitals:   07/26/23 1421 07/26/23 1510 07/26/23 1520 07/26/23 1530  BP: (!) 102/51 (!) 97/57 93/70 (!) 98/57  Pulse: 88     Resp: 19     Temp:  (!) 97 F (36.1 C)    TempSrc:  Temporal    SpO2: 100%     Weight:      Height:        Intake/Output Summary (Last 24 hours) at 07/26/2023 1641 Last data filed at 07/26/2023 0649 Gross per 24 hour  Intake 685.05 ml  Output 600 ml  Net 85.05 ml   Filed Weights   07/24/23 0425 07/25/23 0332 07/26/23 0403  Weight: 85.7 kg 88.1 kg 86.1 kg    Examination:   Constitutional: NAD, alert, oriented to person and place HEENT: conjunctivae and lids normal, EOMI CV: No cyanosis.   RESP: normal respiratory effort Extremities: some edema in BLE, tenderness to palpation of top of right foot SKIN: warm, dry   Data Reviewed: I have personally reviewed labs and imaging studies  Time spent: 50 minutes  Ethan Kanner, MD [facility] If 7PM-7AM, please contact night-coverage 07/26/2023, 4:41 PM

## 2023-07-26 NOTE — Progress Notes (Signed)
 MEWS Progress Note  Patient Details Name: Ethan Vega. MRN: [id number] DOB: 11-Dec-1945 Today's Date: 07/26/2023   MEWS Flowsheet Documentation:  Assess: MEWS Score Temp: (!) 97.3 F (36.3 C) BP: (!) 81/65 MAP (mmHg): 72 Pulse Rate: (!) 106 ECG Heart Rate: (!) 104 Resp: (!) 22 Level of Consciousness: Alert SpO2: 100 % O2 Device: Nasal Cannula Patient Activity (if Appropriate): In bed O2 Flow Rate (L/min): 2 L/min Assess: MEWS Score MEWS Temp: 0 MEWS Systolic: 1 MEWS Pulse: 1 MEWS RR: 1 MEWS LOC: 0 MEWS Score: 3 MEWS Score Color: Yellow Assess: SIRS CRITERIA SIRS Temperature : 0 SIRS Respirations : 1 SIRS Pulse: 1 SIRS WBC: 0 SIRS Score Sum : 2 SIRS Temperature : 0 SIRS Pulse: 1 SIRS Respirations : 1 SIRS WBC: 0 SIRS Score Sum : 2 Assess: if the MEWS score is Yellow or Red Were vital signs accurate and taken at a resting state?: Yes Does the patient meet 2 or more of the SIRS criteria?: Yes Does the patient have a confirmed or suspected source of infection?: Yes MEWS guidelines implemented : Yes, yellow Treat MEWS Interventions: Considered administering scheduled or prn medications/treatments as ordered Take Vital Signs Increase Vital Sign Frequency : Yellow: Q2hr x1, continue Q4hrs until patient remains green for 12hrs Escalate MEWS: Escalate: Yellow: Discuss with charge nurse and consider notifying provider and/or RRT Notify: Charge Nurse/RN Name of Charge Nurse/RN Notified: Adriana Hopping, RN Provider Notification Provider Name/Title: Elisabeth Guild, NP Date Provider Notified: 07/25/23 Time Provider Notified: 2245 Method of Notification: Face-to-face (Provider on unit at time mentioned) Notification Reason: Change in status Provider response: See new orders (Orders placed at 2259) Date of Provider Response: 07/25/23 Time of Provider Response: 2245      Adah Hollering 07/26/2023, 1:32 AM

## 2023-07-26 NOTE — Progress Notes (Signed)
 GI Inpatient Follow-up Note  Subjective:  Patient seen and noted to have recurrent melena last night. CTA showed hyperdense material in stomach. He received two units of pRBC's. Patient notes the melena had previously stopped after our last EGD but restarted again.  Scheduled Inpatient Medications:   [MAR Hold] vitamin C   500 mg Oral BID   [MAR Hold] collagenase    Topical Daily   [MAR Hold] feeding supplement  237 mL Oral TID BM   [MAR Hold] levothyroxine   75 mcg Oral Q0600   [MAR Hold] lidocaine   1 patch Transdermal Q24H   [MAR Hold] lipase/protease/amylase  24,000 Units Oral TID AC   [MAR Hold] liver oil-zinc  oxide   Topical BID   [MAR Hold] megestrol   400 mg Oral Daily   [MAR Hold] midodrine   10 mg Oral TID WC   [MAR Hold] multivitamin with minerals  1 tablet Oral Daily   [MAR Hold] pantoprazole  (PROTONIX ) IV  40 mg Intravenous Q12H   [MAR Hold] pneumococcal 20-valent conjugate vaccine  0.5 mL Intramuscular Tomorrow-1000   [MAR Hold] predniSONE   5 mg Oral Q breakfast   [MAR Hold] QUEtiapine   25 mg Oral QHS   sodium zirconium cyclosilicate   10 g Oral Daily    Continuous Inpatient Infusions:    sodium chloride  75 mL/hr at 07/26/23 0940   sodium chloride       PRN Inpatient Medications:  [MAR Hold] acetaminophen , [MAR Hold] docusate sodium , [MAR Hold] mouth rinse  Review of Systems:  Review of Systems  Constitutional:  Negative for chills and fever.  HENT:  Positive for hearing loss.   Respiratory:  Negative for shortness of breath.   Cardiovascular:  Negative for chest pain.  Gastrointestinal:  Positive for melena.  Musculoskeletal:  Positive for joint pain.  Skin:  Negative for rash.  Neurological:  Negative for focal weakness.  Psychiatric/Behavioral:  Negative for substance abuse.   All other systems reviewed and are negative.    Physical Examination: BP (!) 102/51   Pulse 88   Temp (!) 97 F (36.1 C) (Temporal)   Resp 19   Ht 5\' 3"  (1.6 m)   Wt 86.1 kg    SpO2 100%   BMI 33.62 kg/m  Gen: NAD, alert and oriented x 4 HEENT: PEERLA, EOMI, Neck: supple, no JVD or thyromegaly Chest: No respiratory distress Abd: soft, NT, ND Ext: 1+ edema, well perfused with 2+ pulses, Skin: no rash or lesions noted Lymph: no LAD  Data: Lab Results  Component Value Date   WBC 23.3 (H) 07/26/2023   HGB 10.0 (L) 07/26/2023   HCT 25.7 (L) 07/26/2023   MCV 94.5 07/26/2023   PLT 103 (L) 07/26/2023   Recent Labs  Lab 07/25/23 2310 07/26/23 0341 07/26/23 0956  HGB 6.4* 8.5* 10.0*   Lab Results  Component Value Date   NA 136 07/26/2023   K 4.9 07/26/2023   CL 108 07/26/2023   CO2 15 (L) 07/26/2023   BUN 85 (H) 07/26/2023   CREATININE 2.03 (H) 07/26/2023   Lab Results  Component Value Date   ALT 39 07/17/2023   AST 31 07/17/2023   ALKPHOS 41 07/17/2023   BILITOT 1.5 (H) 07/17/2023   Recent Labs  Lab 07/23/23 1733  INR 1.1   Assessment/Plan: Mr. Iffland is a 78 y.o. gentleman with history of a. Fib on DOAC, HLD, hypertension, and HFpEF who presented with sepsis presumed due to lower extremity wound and found to have PUD. Appears to have had some rebleeding overnight as  evidence in hemoglobin drop, melena, and CT imaging showing hyperdense material in stomach  Recommendations:  - maintain active type and screen - trend hemoglobins - transfuse for hemoglobin < 7 - IV PPI BID - will plan for repeat EGD today given concern for rebleeding, depending on how ulcer looks, can potentially treat if evidence of rebleeding - further recs after procedure  Olivia Bevel MD, MPH [facility] GI

## 2023-07-26 NOTE — Op Note (Signed)
 [facility] Gastroenterology Patient Name: Ethan Vega Procedure Date: 07/26/2023 2:11 PM MRN: [id number] Account #: [id number] Date of Birth: May 21, 1945 Admit Type: Inpatient Age: 78 Room: [facility] ENDO ROOM 4 Gender: Male Note Status: Finalized Instrument Name: [name] Endoscope [id number] Procedure:             Upper GI endoscopy Indications:           Melena Providers:             Leida Puna MD, MD Referring MD:          Larissa Plowman. Donley Furth, MD (Referring MD) Medicines:             Monitored Anesthesia Care Complications:         No immediate complications. Procedure:             Pre-Anesthesia Assessment:                        - Prior to the procedure, a History and Physical was                         performed, and patient medications and allergies were                         reviewed. The patient is competent. The risks and                         benefits of the procedure and the sedation options and                         risks were discussed with the patient. All questions                         were answered and informed consent was obtained.                         Patient identification and proposed procedure were                         verified by the physician, the nurse, the                         anesthesiologist, the anesthetist and the technician                         in the endoscopy suite. Mental Status Examination:                         alert and oriented. Airway Examination: normal                         oropharyngeal airway and neck mobility. Respiratory                         Examination: clear to auscultation. CV Examination:                         normal. Prophylactic Antibiotics: The patient does not  require prophylactic antibiotics. Prior                         Anticoagulants: The patient has taken no anticoagulant                         or antiplatelet agents. ASA Grade Assessment: III - A                          patient with severe systemic disease. After reviewing                         the risks and benefits, the patient was deemed in                         satisfactory condition to undergo the procedure. The                         anesthesia plan was to use monitored anesthesia care                         (MAC). Immediately prior to administration of                         medications, the patient was re-assessed for adequacy                         to receive sedatives. The heart rate, respiratory                         rate, oxygen saturations, blood pressure, adequacy of                         pulmonary ventilation, and response to care were                         monitored throughout the procedure. The physical                         status of the patient was re-assessed after the                         procedure.                        After obtaining informed consent, the endoscope was                         passed under direct vision. Throughout the procedure,                         the patient's blood pressure, pulse, and oxygen                         saturations were monitored continuously. The Endoscope                         was introduced through the mouth, and advanced to the  second part of duodenum. The upper GI endoscopy was                         accomplished without difficulty. The patient tolerated                         the procedure well. Findings:      The examined esophagus was normal.      One non-bleeding superficial gastric ulcer with a clean ulcer base       (Forrest Class III) was found in the gastric body. The lesion was 5 mm       in largest dimension. The ulcer was nearly healed.      Patchy mild inflammation characterized by erythema was found in the       duodenal bulb. No ulcer noted in the bulb. Impression:            - Normal esophagus.                        - Non-bleeding gastric ulcer with a clean  ulcer base                         (Forrest Class III).                        - Duodenitis.                        - No specimens collected. Recommendation:        - Return patient to hospital ward for ongoing care.                        - Resume previous diet.                        - Continue present medications.                        - Will discuss colonoscopy with him and daughter but                         feel like the risks outweigh the benefits at this                         point. He had a good colonoscopy less than 5 years                         ago, a known source of his melena found. I would                         monitor for any recurrent bleeding and if it recurs                         then can prep and do a colonoscopy. In terms of                         restarting DOAC, his stomach ulcer is almost healed so  can hold off restarting now but should be safe to                         restart in 2 days. Procedure Code(s):     --- Professional ---                        [phone], Esophagogastroduodenoscopy, flexible,                         transoral; diagnostic, including collection of                         specimen(s) by brushing or washing, when performed                         (separate procedure) Diagnosis Code(s):     --- Professional ---                        K25.9, Gastric ulcer, unspecified as acute or chronic,                         without hemorrhage or perforation                        K29.80, Duodenitis without bleeding                        K92.1, Melena (includes Hematochezia) CPT copyright 2022 American Medical Association. All rights reserved. The codes documented in this report are preliminary and upon coder review may  be revised to meet current compliance requirements. Leida Puna MD, MD 07/26/2023 3:22:54 PM Number of Addenda: 0 Note Initiated On: 07/26/2023 2:11 PM Estimated Blood Loss:  Estimated blood loss:  none.      [facility]

## 2023-07-26 NOTE — Anesthesia Preprocedure Evaluation (Signed)
 Anesthesia Evaluation  Patient identified by MRN, date of birth, ID band Patient confused    Reviewed: Allergy & Precautions, NPO status , Patient's Chart, lab work & pertinent test results  History of Anesthesia Complications Negative for: history of anesthetic complications  Airway Mallampati: III  TM Distance: >3 FB Neck ROM: full    Dental  (+) Edentulous Upper, Partial Lower, Dental Advidsory Given   Pulmonary neg shortness of breath, asthma , neg recent URI   Pulmonary exam normal        Cardiovascular hypertension, On Medications (-) angina + CAD and +CHF  (-) Past MI and (-) Cardiac Stents + dysrhythmias Atrial Fibrillation + Cardiac Defibrillator   ECHO 4/25: 1. Left ventricular ejection fraction, by estimation, is 30 to 35%. The left ventricle has moderately decreased function. The left ventricle demonstrates global hypokinesis. Left ventricular diastolic parameters are indeterminate.   2. Right ventricular systolic function is normal. The right ventricular size is normal. Tricuspid regurgitation signal is inadequate for assessing PA pressure.   3. The mitral valve is normal in structure. No evidence of mitral valve regurgitation. No evidence of mitral stenosis.   4. The aortic valve is normal in structure. Aortic valve regurgitation is not visualized. No aortic stenosis is present.   5. The inferior vena cava is normal in size with greater than 50% respiratory variability, suggesting right atrial pressure of 3 mmHg.    Neuro/Psych  PSYCHIATRIC DISORDERS     Dementia negative neurological ROS     GI/Hepatic negative GI ROS, Neg liver ROS,,,  Endo/Other  Hypothyroidism    Renal/GU Renal disease  negative genitourinary   Musculoskeletal   Abdominal   Peds  Hematology negative hematology ROS (+)   Anesthesia Other Findings Past Medical History: No date: Actinic keratosis 03/31/2014: AICD (automatic  cardioverter/defibrillator) present     Comment:  a. 03/2014 s/p SJM 2411-36C dual chamber AICD (serial               Number [id number])- followed by Dr. Rodolfo Clan No date: Arthritis     Comment:  a. L knee. No date: Asthma 08/29/2008: Basal cell carcinoma     Comment:  Vertex scalp. Keratotic pattern, ulcerated. 01/15/2018: Basal cell carcinoma     Comment:  Left distal medial thigh near knee. Fibroepithelioma of               pinkus type 01/15/2018: Basal cell carcinoma     Comment:  Right distal lat. nose ant. inferior edge. Nodular               pattern. No date: Cardiac arrest [facility])     Comment:  a. 03/30/2014 VF arrest in setting of NICM >> CPR/defib               in community >> s/p dual chamber AICD No date: Coronary artery disease, non-occlusive     Comment:  a. cath 03/2014 at [facility]: no sig CAD (OM1 30%, CFX 30%),               EF 35% No date: Dementia (HCC) 01/31/2020: Dysplastic nevus     Comment:  L mid back paraspinal - severe, excision 03/21/2020 No date: HLD (hyperlipidemia) No date: HTN (hypertension) No date: Kidney stones No date: Melanoma (HCC)     Comment:  Melanoma resected from scalp No date: NICM (nonischemic cardiomyopathy) (HCC)     Comment:  a. 03/2014 Echo:  Inferolateral and lateral HK, EF  50-55%, grade 1 diastolic dysfunction, mild MR, mild LAE,              mild RAE, trivial TR, no effusion; 09/2018 Echo: EF               35-40%, impaired relaxation, glob HK, sev apical HK. Nl               RV fxn. RVSP . Mildly dil LA. 03/28/2015: Paroxysmal atrial fibrillation (HCC) No date: Retinal artery occlusion  Past Surgical History: No date: adenomatous polyps 03/30/2014: CARDIAC CATHETERIZATION 03/30/2014: CARDIOVERSION 03/24/2017: COLONOSCOPY WITH PROPOFOL ; N/A     Comment:  Procedure: COLONOSCOPY WITH PROPOFOL ;  Surgeon:               Deveron Fly, MD;  Location: [facility] ENDOSCOPY;                Service: Endoscopy;  Laterality:  N/A; 01/07/2019: COLONOSCOPY WITH PROPOFOL ; N/A     Comment:  Procedure: COLONOSCOPY WITH PROPOFOL ;  Surgeon: [place],               Alphonsus Jeans, MD;  Location: [facility] ENDOSCOPY;  Service:               Gastroenterology;  Laterality: N/A; No date: CYSTOSCOPY W/ LITHOLAPAXY / EHL 03/31/2014: IMPLANTABLE CARDIOVERTER DEFIBRILLATOR IMPLANT; N/A     Comment:  Procedure: IMPLANTABLE CARDIOVERTER DEFIBRILLATOR               IMPLANT;  Surgeon: Tammie Fall, MD;  Location: [facility] CATH              LAB;  Service: Cardiovascular;  Laterality: N/A; No date: JOINT REPLACEMENT No date: TONSILLECTOMY     Comment:  "as a kid" ~ 2010: TOTAL KNEE ARTHROPLASTY; Left ~ 2011: TOTAL KNEE ARTHROPLASTY WITH REVISION COMPONENTS; Left ~ 2014: VARICOSE VEIN SURGERY; Left  BMI    Body Mass Index: 33.43 kg/m      Reproductive/Obstetrics negative OB ROS                             Anesthesia Physical Anesthesia Plan  ASA: 3  Anesthesia Plan: General   Post-op Pain Management: Minimal or no pain anticipated   Induction: Intravenous  PONV Risk Score and Plan: 1 and Propofol  infusion and TIVA  Airway Management Planned: Natural Airway and Nasal Cannula  Additional Equipment:   Intra-op Plan:   Post-operative Plan:   Informed Consent: I have reviewed the patients History and Physical, chart, labs and discussed the procedure including the risks, benefits and alternatives for the proposed anesthesia with the patient or authorized representative who has indicated his/her understanding and acceptance.     Dental Advisory Given  Plan Discussed with: Anesthesiologist, CRNA and Surgeon  Anesthesia Plan Comments: (Patient consented for risks of anesthesia including but not limited to:  - adverse reactions to medications - risk of airway placement if required - damage to eyes, teeth, lips or other oral mucosa - nerve damage due to positioning  - sore throat or hoarseness - Damage to  heart, brain, nerves, lungs, other parts of body or loss of life  Patient voiced understanding and assent.)        Anesthesia Quick Evaluation

## 2023-07-26 NOTE — Plan of Care (Signed)

## 2023-07-27 DIAGNOSIS — R6521 Severe sepsis with septic shock: Secondary | ICD-10-CM | POA: Diagnosis not present

## 2023-07-27 DIAGNOSIS — A419 Sepsis, unspecified organism: Secondary | ICD-10-CM | POA: Diagnosis not present

## 2023-07-27 LAB — TYPE AND SCREEN
ABO/RH(D): O POS
Antibody Screen: NEGATIVE
Unit division: 0
Unit division: 0
Unit division: 0
Unit division: 0
Unit division: 0

## 2023-07-27 LAB — CBC
HCT: 23.5 % — ABNORMAL LOW (ref 39.0–52.0)
Hemoglobin: 7.9 g/dL — ABNORMAL LOW (ref 13.0–17.0)
MCH: 31.7 pg (ref 26.0–34.0)
MCHC: 33.6 g/dL (ref 30.0–36.0)
MCV: 94.4 fL (ref 80.0–100.0)
Platelets: 103 10*3/uL — ABNORMAL LOW (ref 150–400)
RBC: 2.49 MIL/uL — ABNORMAL LOW (ref 4.22–5.81)
RDW: 17.6 % — ABNORMAL HIGH (ref 11.5–15.5)
WBC: 15 10*3/uL — ABNORMAL HIGH (ref 4.0–10.5)
nRBC: 1.9 % — ABNORMAL HIGH (ref 0.0–0.2)

## 2023-07-27 LAB — BPAM RBC
Blood Product Expiration Date: 202506112359
Blood Product Expiration Date: 202506112359
Blood Product Expiration Date: 202505122359
Blood Product Expiration Date: 202506072359
Blood Product Expiration Date: 202506102359
ISSUE DATE / TIME: 202505100358
ISSUE DATE / TIME: 202505081001
ISSUE DATE / TIME: 202505081303
ISSUE DATE / TIME: 202505091243
ISSUE DATE / TIME: 202505100059
Unit Type and Rh: 5100
Unit Type and Rh: 202506112359
Unit Type and Rh: 5100
Unit Type and Rh: 5100
Unit Type and Rh: 5100
Unit Type and Rh: 5100

## 2023-07-27 LAB — BASIC METABOLIC PANEL WITH GFR
Anion gap: 9 (ref 5–15)
BUN: 81 mg/dL — ABNORMAL HIGH (ref 8–23)
CO2: 20 mmol/L — ABNORMAL LOW (ref 22–32)
Calcium: 8.2 mg/dL — ABNORMAL LOW (ref 8.9–10.3)
Chloride: 109 mmol/L (ref 98–111)
Creatinine, Ser: 1.97 mg/dL — ABNORMAL HIGH (ref 0.61–1.24)
GFR, Estimated: 34 mL/min — ABNORMAL LOW (ref >60)
Glucose, Bld: 94 mg/dL (ref 70–99)
Potassium: 4.9 mmol/L (ref 3.5–5.1)
Sodium: 138 mmol/L (ref 135–145)

## 2023-07-27 NOTE — Progress Notes (Signed)
 GI Inpatient Follow-up Note  Subjective:  Patient seen and overall stable. Not sure if had any bowel movements but according to nurse he had 1 dark bowel movement last night but none so far today. Hemoglobin down but suspect the 10 was incorrect.  Scheduled Inpatient Medications:   vitamin C   500 mg Oral BID   collagenase    Topical Daily   feeding supplement  237 mL Oral TID BM   hydrocortisone   25 mg Rectal BID   levothyroxine   75 mcg Oral Q0600   lidocaine   1 patch Transdermal Q24H   lipase/protease/amylase  24,000 Units Oral TID AC   liver oil-zinc  oxide   Topical BID   megestrol   400 mg Oral Daily   midodrine   10 mg Oral TID WC   multivitamin with minerals  1 tablet Oral Daily   pantoprazole  (PROTONIX ) IV  40 mg Intravenous Q12H   pneumococcal 20-valent conjugate vaccine  0.5 mL Intramuscular Tomorrow-1000   predniSONE   5 mg Oral Q breakfast   QUEtiapine   25 mg Oral QHS    Continuous Inpatient Infusions:    PRN Inpatient Medications:  acetaminophen , docusate sodium , mouth rinse  Review of Systems:  Review of Systems  Constitutional:  Positive for malaise/fatigue. Negative for chills and fever.  Respiratory:  Negative for shortness of breath.   Cardiovascular:  Positive for chest pain.  Gastrointestinal:  Negative for abdominal pain, nausea and vomiting.  Musculoskeletal:  Positive for joint pain.  Skin:  Positive for itching.  Neurological:  Negative for focal weakness.  Psychiatric/Behavioral:  Negative for substance abuse.   All other systems reviewed and are negative.     Physical Examination: BP 93/70 (BP Location: Right Arm)   Pulse 79   Temp 98.3 F (36.8 C) (Oral)   Resp 16   Ht 5\' 3"  (1.6 m)   Wt 87 kg   SpO2 100%   BMI 33.98 kg/m  Gen: NAD HEENT: PEERLA, EOMI, Neck: supple, no JVD or thyromegaly Chest: No respiratory distress Abd: soft, non-tender, non-distended Ext: 1+ edema, well perfused with 2+ pulses, Skin: no rash or lesions  noted Lymph: no LAD  Data: Lab Results  Component Value Date   WBC 15.0 (H) 07/27/2023   HGB 7.9 (L) 07/27/2023   HCT 23.5 (L) 07/27/2023   MCV 94.4 07/27/2023   PLT 103 (L) 07/27/2023   Recent Labs  Lab 07/26/23 0341 07/26/23 0956 07/27/23 0440  HGB 8.5* 10.0* 7.9*   Lab Results  Component Value Date   NA 138 07/27/2023   K 4.9 07/27/2023   CL 109 07/27/2023   CO2 20 (L) 07/27/2023   BUN 81 (H) 07/27/2023   CREATININE 1.97 (H) 07/27/2023   Lab Results  Component Value Date   ALT 39 07/17/2023   AST 31 07/17/2023   ALKPHOS 41 07/17/2023   BILITOT 1.5 (H) 07/17/2023   Recent Labs  Lab 07/23/23 1733  INR 1.1   Assessment/Plan: Mr. Yenter is a 78 y.o. gentleman with history of a. Fib on DOAC, HLD, hypertension, and HFpEF who presented with sepsis presumed due to lower extremity wound and found to have PUD. If any recurrent bleeding then may need to perform colonoscopy which may be difficult due to limited mobility  Recommendations:  - maintain active type and screen - trend hemoglobins - transfuse for hemoglobin < 7 - IV PPI BID - continue to monitor for significant recurrent GI bleeding - if any significant recurrent bleeding then would perform colonoscopy if patient/family agreeable -  will continue to follow, Dr. Ole Berkeley will pick up care tomorrow    Olivia Bevel MD, MPH [facility] GI

## 2023-07-27 NOTE — Progress Notes (Signed)
 PROGRESS NOTE    Ethan Vega.  ZOX:[id number] DOB: 08-12-1945 DOA: 07/10/2023 PCP: Shari Daughters, MD  126A/126A-AA  LOS: 17 days   Brief hospital course:   Assessment & Plan: Ethan Vega is a 78 year old male with a history of essential hypertension, nonischemic cardiomyopathy history of paroxysmal A-fib and ventricular tachycardia, dyslipidemia who was seen earlier today with electrophysiology service to have ICD generator change however he was noted to have open sores on his left lower extremity and was mentating with confusion and encephalopathy with decision to abort procedure and reschedule. He was subsequently sent to the ER. His workup revealed severe AKI and leukocytosis and vitals with shock physiology. Patient received IV fluid resuscitation but despite 3 L of intravenous fluids continues to require Levophed  support. He reports diarrhea over the past week. Patient currently lives alone in his home and was previously able to perform all activities of daily living. He is unaware of the open score and cellulitic lesion of his left foot.    Upper GI bleed 2/2 gastric and duodenal ulcer Eliquis  contributed to GI bleed  --EGD found clean based gastric and duodenal ulcers. --more bleeding overnight.  CTA bleeding scan neg.  EGD 5/10 found healing ulcers. --monitor, if large amount of fresh bleed out from rectum, will order nuclear bleeding scan. --cont soft diet --cont IV PPI  Hypotension --cont midodrine   Acute blood loss anemia --s/p 4u pRBC --monitor Hgb and transfuse to keep Hgb >7, or >8 in the setting of active GI bleed  Septic shock secondary to left foot infection. Left foot ulcer with cellulitis. Elevated troponin secondary to septic shock. Adrenal insufficiency. Patient had received IV fluid bolus, he also received Levophed .  Currently off Levophed , blood pressure is borderline low, added midodrine .  Also check cortisol level. Blood Culture has no  growth. Patient was treated with vancomycin , completed dose.  Then on Zosyn . CT scan of the foot did not show any bony involvement. He was seen by podiatry, no need for debridement.  I will continue oral antibiotics with Augmentin  for additional 3 days to complete 10-day course.  Condition seems to be improved. Patient low blood pressure was also associated with adrenal insufficiency, cortisol level in the morning was only 3.5, started prednisone  5 mg daily. --cont steroid   Paroxysmal atrial fibrillation. Nonsustained ventricular tachycardia. Nonischemic cardiomyopathy with chronic systolic congestive heart failure with EF 30-35%. --hold anticoagulation   Failure to thrive. Anorexia. Chronic diarrhea. No longer has diarrhea on Creon . Also started Protonix , megestrol . Patient condition finally improving, appetite is better.    Acute kidney injury chronic kidney disease stage IIIa. Hyponatremia. Followed by nephrology, briefly needing dialysis.  Renal function had improved.     --monitor   Hyperkalemia --Lokelma  PRN  Right foot pain --US  DVT studies neg for DVT in BLE  Acute metabolic encephalopathy. Chronic dementia. Condition appears to be stable.   Debility. PT/OT recommended nursing home placement.   Class 1 obesity. BMI 32.77 Patient currently has a very poor appetite.   Pressure ulcer POA Pressure Injury 07/11/23 Buttocks Left Stage 2 -  Partial thickness loss of dermis presenting as a shallow open injury with a red, pink wound bed without slough. (Active)  07/11/23 1049  Location: Buttocks  Location Orientation: Left  Staging: Stage 2 -  Partial thickness loss of dermis presenting as a shallow open injury with a red, pink wound bed without slough.  Wound Description (Comments):   Present on Admission: Yes  Pressure Injury 07/11/23 Buttocks Medial Stage 2 -  Partial thickness loss of dermis presenting as a shallow open injury with a red, pink wound bed without  slough. (Active)  07/11/23 1100  Location: Buttocks  Location Orientation: Medial  Staging: Stage 2 -  Partial thickness loss of dermis presenting as a shallow open injury with a red, pink wound bed without slough.  Wound Description (Comments):   Present on Admission: Yes    DVT prophylaxis: SCD/Compression stockings Code Status: DNR  Family Communication: daughter updated on the phone today Level of care: Med-Surg Dispo:   The patient is from: home Anticipated d/c is to: SNF rehab Anticipated d/c date is: 2-3 days   Subjective and Interval History:  1 episode of melena overnight, per nursing report.  No new complaint today.   Objective: Vitals:   07/27/23 0441 07/27/23 0814 07/27/23 1214 07/27/23 1549  BP: 105/62 93/70 95/66  93/66  Pulse: 82 79 82 73  Resp: 18 16  18   Temp: 98.2 F (36.8 C) 98.3 F (36.8 C) 98.1 F (36.7 C) 99 F (37.2 C)  TempSrc: Oral Oral    SpO2: 99% 100% 100% 99%  Weight: 87 kg     Height:        Intake/Output Summary (Last 24 hours) at 07/27/2023 1840 Last data filed at 07/27/2023 0441 Gross per 24 hour  Intake 240 ml  Output 700 ml  Net -460 ml   Filed Weights   07/25/23 0332 07/26/23 0403 07/27/23 0441  Weight: 88.1 kg 86.1 kg 87 kg    Examination:   Constitutional: NAD, alert, oriented to person and place HEENT: conjunctivae and lids normal, EOMI CV: No cyanosis.   RESP: normal respiratory effort, on RA Extremities: edema in BLE SKIN: warm, dry   Data Reviewed: I have personally reviewed labs and imaging studies  Time spent: 35 minutes  Garrison Kanner, MD [facility] If 7PM-7AM, please contact night-coverage 07/27/2023, 6:40 PM

## 2023-07-27 NOTE — Plan of Care (Signed)

## 2023-07-28 ENCOUNTER — Encounter: Payer: Self-pay | Admitting: Gastroenterology

## 2023-07-28 ENCOUNTER — Ambulatory Visit: Payer: Medicare Other

## 2023-07-28 DIAGNOSIS — R710 Precipitous drop in hematocrit: Secondary | ICD-10-CM | POA: Diagnosis not present

## 2023-07-28 DIAGNOSIS — A419 Sepsis, unspecified organism: Secondary | ICD-10-CM | POA: Diagnosis not present

## 2023-07-28 DIAGNOSIS — N179 Acute kidney failure, unspecified: Secondary | ICD-10-CM | POA: Diagnosis not present

## 2023-07-28 DIAGNOSIS — L03116 Cellulitis of left lower limb: Secondary | ICD-10-CM | POA: Diagnosis not present

## 2023-07-28 DIAGNOSIS — E2740 Unspecified adrenocortical insufficiency: Secondary | ICD-10-CM | POA: Diagnosis not present

## 2023-07-28 DIAGNOSIS — K259 Gastric ulcer, unspecified as acute or chronic, without hemorrhage or perforation: Secondary | ICD-10-CM | POA: Diagnosis not present

## 2023-07-28 DIAGNOSIS — R6521 Severe sepsis with septic shock: Secondary | ICD-10-CM | POA: Diagnosis not present

## 2023-07-28 DIAGNOSIS — K921 Melena: Secondary | ICD-10-CM

## 2023-07-28 DIAGNOSIS — Z8719 Personal history of other diseases of the digestive system: Secondary | ICD-10-CM | POA: Diagnosis not present

## 2023-07-28 LAB — CBC
HCT: 20.8 % — ABNORMAL LOW (ref 39.0–52.0)
Hemoglobin: 7 g/dL — ABNORMAL LOW (ref 13.0–17.0)
MCH: 32.1 pg (ref 26.0–34.0)
MCHC: 33.7 g/dL (ref 30.0–36.0)
MCV: 95.4 fL (ref 80.0–100.0)
Platelets: 114 10*3/uL — ABNORMAL LOW (ref 150–400)
RBC: 2.18 MIL/uL — ABNORMAL LOW (ref 4.22–5.81)
RDW: 18.3 % — ABNORMAL HIGH (ref 11.5–15.5)
WBC: 9.9 10*3/uL (ref 4.0–10.5)
nRBC: 0.7 % — ABNORMAL HIGH (ref 0.0–0.2)

## 2023-07-28 LAB — PREPARE RBC (CROSSMATCH)

## 2023-07-28 LAB — BASIC METABOLIC PANEL WITH GFR
Anion gap: 5 (ref 5–15)
BUN: 66 mg/dL — ABNORMAL HIGH (ref 8–23)
CO2: 22 mmol/L (ref 22–32)
Calcium: 8.2 mg/dL — ABNORMAL LOW (ref 8.9–10.3)
Chloride: 108 mmol/L (ref 98–111)
Creatinine, Ser: 1.77 mg/dL — ABNORMAL HIGH (ref 0.61–1.24)
GFR, Estimated: 38 mL/min — ABNORMAL LOW (ref >60)
Glucose, Bld: 98 mg/dL (ref 70–99)
Potassium: 4.5 mmol/L (ref 3.5–5.1)
Sodium: 135 mmol/L (ref 135–145)

## 2023-07-28 LAB — BLOOD GAS, VENOUS
Bicarbonate: 16.8 mmol/L — ABNORMAL LOW (ref 20.0–28.0)
Patient temperature: 37 mmol/L — ABNORMAL HIGH (ref 0.0–2.0)
pCO2, Ven: 35 mmHg — ABNORMAL LOW (ref 44–60)
pH, Ven: 7.29 (ref 7.25–7.43)
pO2, Ven: 16.8 mmol/L — ABNORMAL LOW (ref 32–45)

## 2023-07-28 MED ORDER — SODIUM CHLORIDE 0.9% IV SOLUTION: Freq: Once | INTRAVENOUS | Status: AC

## 2023-07-28 NOTE — Progress Notes (Signed)
 Occupational Therapy Treatment Patient Details Name: Ethan Vega. MRN: [id number] DOB: Jan 20, 1946 Today's Date: 07/28/2023   History of present illness Patient is a 78 year old male with renal failure, toxic metabolic encephalopathy, septic shock with LE cellulitis. Required CRRT and pressor support. History of HTN, atrial fibrillation, ventricular tachycardia, AICD placement, dementia.   OT comments  Pt received sitting EOB working with PT. MD cleared for participation in session. Pt denies adverse symptoms throughout (vitals assessed, see flowsheet for additional details). Pt pleasantly confused, anxiety increases with mobility efforts. Requires cues for hand placement on bed prior to standing, and MAX A +2 to achieve partial stand due to anxiety, fear of falling and weakness. Pt transferred from bed to recliner with poor stepping pattern, MOD A to pivot hips safety to control descent in reliner, MAX A +2 to reposition in chair. HR elevated to 125 after transfer, 105 end of session. Supine BP 89/58, HR 76, Sitting 99/59, HR 89, in recliner 125/67, SpO2 97% on RA. LUE elevated on pillow due to edema (RN notified). OT will continue to follow - pt making gradual progress towards goals.       If plan is discharge home, recommend the following:  A lot of help with bathing/dressing/bathroom;Supervision due to cognitive status;Assist for transportation;Help with stairs or ramp for entrance;Two people to help with walking and/or transfers   Equipment Recommendations  Other (comment)       Precautions / Restrictions Precautions Precautions: Fall Recall of Precautions/Restrictions: Impaired Restrictions Weight Bearing Restrictions Per Provider Order: No       Mobility Bed Mobility Overal bed mobility: Needs Assistance                  Transfers Overall transfer level: Needs assistance Equipment used: Rolling walker (2 wheels) Transfers: Sit to/from Stand Sit to Stand: Max  assist, +2 safety/equipment, From elevated surface     Step pivot transfers: Max assist, +2 physical assistance, +2 safety/equipment     General transfer comment: Pt anxious throughout, heavy reliance on UE support on RW, cues to push up from bed. cleared buttocks upon multiple standing attempts, but increased anxiety upon each trial     Balance Overall balance assessment: Needs assistance Sitting-balance support: Feet supported, Bilateral upper extremity supported Sitting balance-Leahy Scale: Fair Sitting balance - Comments: anxious throughout   Standing balance support: Bilateral upper extremity supported, During functional activity, Reliant on assistive device for balance Standing balance-Leahy Scale: Zero Standing balance comment: pt remains high fall risk                           ADL either performed or assessed with clinical judgement   ADL Overall ADL's : Needs assistance/impaired                                     Functional mobility during ADLs: Maximal assistance;+2 for safety/equipment;Rolling walker (2 wheels);Cueing for sequencing;Cueing for safety General ADL Comments: Session focused on functional transfers     Communication Communication Communication: No apparent difficulties   Cognition Arousal: Alert Behavior During Therapy: Anxious Cognition: Cognition impaired   Orientation impairments: Place, Time, Situation Awareness: Intellectual awareness impaired, Online awareness impaired Memory impairment (select all impairments): Short-term memory, Working [employer] fast streamer, Non-declarative long-term memory, Geneticist, molecular long-term memory Attention impairment (select first level of impairment): Sustained attention Executive functioning impairment (select all impairments): Reasoning, Problem  solving OT - Cognition Comments: Seem high level of anxiousness impacts his thinking and sequencing; confused throughout, asks questions multiple times, unaware  of how to use call bell                 Following commands: Impaired Following commands impaired: Follows one step commands with increased time      Cueing   Cueing Techniques: Verbal cues, Tactile cues, Gestural cues, Visual cues        General Comments secure chat to nursing regarding use of Stedy for transfers    Pertinent Vitals/ Pain       Pain Assessment Pain Assessment: No/denies pain Pain Score: 0-No pain   Frequency  Min 2X/week        Progress Toward Goals  OT Goals(current goals can now be found in the care plan section)  Progress towards OT goals: Progressing toward goals  Acute Rehab OT Goals OT Goal Formulation: With patient Time For Goal Achievement: 07/30/23 Potential to Achieve Goals: Fair ADL Goals Pt Will Perform Grooming: with supervision;sitting Pt Will Perform Lower Body Dressing: with min assist;sitting/lateral leans;sit to/from stand Pt Will Transfer to Toilet: with min assist;ambulating Pt Will Perform Toileting - Clothing Manipulation and hygiene: with min assist;sitting/lateral leans;sit to/from stand  Plan      Co-evaluation    PT/OT/SLP Co-Evaluation/Treatment: Yes Reason for Co-Treatment: To address functional/ADL transfers;For patient/therapist safety;Necessary to address cognition/behavior during functional activity PT goals addressed during session: Mobility/safety with mobility OT goals addressed during session: ADL's and self-care      AM-PAC OT "6 Clicks" Daily Activity     Outcome Measure   Help from another person eating meals?: A Little Help from another person taking care of personal grooming?: A Lot Help from another person toileting, which includes using toliet, bedpan, or urinal?: A Lot Help from another person bathing (including washing, rinsing, drying)?: A Lot Help from another person to put on and taking off regular upper body clothing?: A Little Help from another person to put on and taking off regular  lower body clothing?: Total 6 Click Score: 13    End of Session Equipment Utilized During Treatment: Gait belt;Rolling walker (2 wheels)  OT Visit Diagnosis: Other abnormalities of gait and mobility (R26.89);Muscle weakness (generalized) (M62.81)   Activity Tolerance Other (comment);Patient tolerated treatment well (limited by anxiety)   Patient Left in chair;with chair alarm set;with call bell/phone within reach   Nurse Communication Mobility status;Other (comment);Need for lift equipment (vital signs, use of stedy, and anxiety)        Time: 1220-1240 OT Time Calculation (min): 20 min  Charges: OT General Charges $OT Visit: 1 Visit OT Treatments $Self Care/Home Management : 8-22 mins  Judson Tsan L. Johanny Segers, OTR/L  07/28/23, 2:16 PM

## 2023-07-28 NOTE — Progress Notes (Signed)
 PROGRESS NOTE    Ethan Vega.  ZOX:[id number] DOB: 1946-02-14 DOA: 07/10/2023 PCP: Shari Daughters, MD  126A/126A-AA  LOS: 18 days   Brief hospital course:   Assessment & Plan: Ethan Vega is a 78 year old male with a history of essential hypertension, nonischemic cardiomyopathy history of paroxysmal A-fib and ventricular tachycardia, dyslipidemia who was seen earlier today with electrophysiology service to have ICD generator change however he was noted to have open sores on his left lower extremity and was mentating with confusion and encephalopathy with decision to abort procedure and reschedule. He was subsequently sent to the ER. His workup revealed severe AKI and leukocytosis and vitals with shock physiology. Patient received IV fluid resuscitation but despite 3 L of intravenous fluids continues to require Levophed  support. He reports diarrhea over the past week. Patient currently lives alone in his home and was previously able to perform all activities of daily living. He is unaware of the open score and cellulitic lesion of his left foot.    Upper GI bleed 2/2 gastric and duodenal ulcer Eliquis  contributed to GI bleed  --EGD found clean based gastric and duodenal ulcers. --more bleeding overnight.  CTA bleeding scan neg.  EGD 5/10 found healing ulcers. --monitor, if large amount of fresh bleed out from rectum, will order nuclear bleeding scan. --cont soft diet --cont IV PPI  Hypotension --cont midodrine   Acute blood loss anemia --s/p 4u pRBC --1u pRBC today for Hgb 7.0  Septic shock secondary to left foot infection. Left foot ulcer with cellulitis. Elevated troponin secondary to septic shock. Adrenal insufficiency. Patient had received IV fluid bolus, he also received Levophed .  Currently off Levophed , blood pressure is borderline low, added midodrine .  Also check cortisol level. Blood Culture has no growth. Patient was treated with vancomycin , completed dose.   Then on Zosyn . CT scan of the foot did not show any bony involvement. He was seen by podiatry, no need for debridement.  I will continue oral antibiotics with Augmentin  for additional 3 days to complete 10-day course.  Condition seems to be improved. Patient low blood pressure was also associated with adrenal insufficiency, cortisol level in the morning was only 3.5, started prednisone  5 mg daily. --cont steroid   Paroxysmal atrial fibrillation. Nonsustained ventricular tachycardia. Nonischemic cardiomyopathy with chronic systolic congestive heart failure with EF 30-35%. --hold anticoagulation   Failure to thrive. Anorexia. Chronic diarrhea. No longer has diarrhea on Creon . Also started Protonix , megestrol . Patient condition finally improving, appetite is better.    Acute kidney injury chronic kidney disease stage IIIa. Hyponatremia. Followed by nephrology, briefly needing dialysis.  Renal function had improved.     --monitor   Hyperkalemia --Lokelma  PRN  Right foot pain --US  DVT studies neg for DVT in BLE  Acute metabolic encephalopathy. Chronic dementia. Condition appears to be stable.   Debility. PT/OT recommended nursing home placement.   Class 1 obesity. BMI 32.77 Patient currently has a very poor appetite.   Pressure ulcer POA Pressure Injury 07/11/23 Buttocks Left Stage 2 -  Partial thickness loss of dermis presenting as a shallow open injury with a red, pink wound bed without slough. (Active)  07/11/23 1049  Location: Buttocks  Location Orientation: Left  Staging: Stage 2 -  Partial thickness loss of dermis presenting as a shallow open injury with a red, pink wound bed without slough.  Wound Description (Comments):   Present on Admission: Yes     Pressure Injury 07/11/23 Buttocks Medial Stage 2 -  Partial thickness  loss of dermis presenting as a shallow open injury with a red, pink wound bed without slough. (Active)  07/11/23 1100  Location: Buttocks   Location Orientation: Medial  Staging: Stage 2 -  Partial thickness loss of dermis presenting as a shallow open injury with a red, pink wound bed without slough.  Wound Description (Comments):   Present on Admission: Yes    DVT prophylaxis: SCD/Compression stockings Code Status: DNR  Family Communication: daughter updated on the phone today Level of care: Med-Surg Dispo:   The patient is from: home Anticipated d/c is to: SNF rehab Anticipated d/c date is: 2-3 days   Subjective and Interval History:  Pt reported feeling weak, couldn't even pull himself up on the recliner.   Objective: Vitals:   07/28/23 1137 07/28/23 1549 07/28/23 1617 07/28/23 1832  BP: 102/61 108/70 105/71 116/70  Pulse: 85 71 72 77  Resp: 18 18 18 16   Temp: 98 F (36.7 C) 98.4 F (36.9 C) 99 F (37.2 C) 98.4 F (36.9 C)  TempSrc:    Oral  SpO2: 100% 100% 100%   Weight:      Height:        Intake/Output Summary (Last 24 hours) at 07/28/2023 1857 Last data filed at 07/28/2023 1820 Gross per 24 hour  Intake 298.17 ml  Output 1501 ml  Net -1202.83 ml   Filed Weights   07/26/23 0403 07/27/23 0441 07/28/23 0500  Weight: 86.1 kg 87 kg 89.5 kg    Examination:   Constitutional: NAD, alert, oriented to person and place HEENT: conjunctivae and lids normal, EOMI CV: No cyanosis.   RESP: normal respiratory effort, on RA Extremities: edema in BLE SKIN: warm, dry   Data Reviewed: I have personally reviewed labs and imaging studies  Time spent: 35 minutes  Garrison Kanner, MD [facility] If 7PM-7AM, please contact night-coverage 07/28/2023, 6:57 PM

## 2023-07-28 NOTE — Progress Notes (Signed)
 Palliative Care Progress Note, Assessment & Plan   Patient Name: Ethan Vega.       Date: 07/28/2023 DOB: 12/21/1945  Age: 78 y.o. MRN#: [id number] Attending Physician: Garrison Kanner, MD Primary Care Physician: Shari Daughters, MD Admit Date: 07/10/2023  Subjective: Patient is sitting at edge of bed with OT at bedside.  They are working to have patient safely transfer from bed to recliner.  Patient acknowledges my presence and is able to make his wishes known.  He has no acute complaints at this time.  HPI: 78 y.o. male  with past medical history of  essential hypertension, nonischemic cardiomyopathy history of paroxysmal A-fib and ventricular tachycardia, dyslipidemia. He presented to hospital 07/10/23 to have ICD generator changed. Staff found him to have open wounds to LLE, confusion and hypotension. He was subsequently sent to ED for evaluation. ED workup found patient to have AKI, leukocytosis and hypotension with creatinine 5.5 (baseline 1.7), Na+ 134, K+ 5.4, BUN 151, BNP 672.1, Trop 164, Lactic 2.2 and WBC 16.1.   He received IV fluid resuscitation but required pressor support. Daughter at bedside reported that patient lives alone, performing his ADLs independently. She adds that he complained of diarrhea and poor PO intake 2 weeks prior. Pt was unaware of the open sore to his LLE but did confirm chronic lower extremity pitting edema. He was admitted to ICU for circulatory shock with AKI.    Palliative care was consulted to discuss goals of care.  Summary of counseling/coordination of care: Extensive chart review completed prior to meeting patient including labs, vital signs, imaging, progress notes, orders, and available advanced directive documents from current and previous encounters.   After  reviewing the patient's chart and assessing the patient at bedside, I spoke with patient in regards to symptom management and goals of care.   As assessed.  Patient denies chest pain, headache, nausea/vomiting, or other acute ailments.  Discussed that his hemoglobin remains low and his blood pressure is on the soft side as well.  However, he has no acute complaints.  No adjustment to [facility] needed.  After visiting with the patient, I spoke with his daughter Bloom over the phone.  Medical update given. Discussed watchful waiting in hopes that patient's hemoglobin will rebound.  Reviewed that should it drop even further he will again receive packed RBCs.  She is in agreement with this plan.  Reviewed GIs recommendations and discussed potential for colonoscopy.  Lapole shares she is hopeful that this is a "last ditch effort" and would like to avoid colonoscopy if possible.  Discussed that plan is to monitor patient now and to reevaluate on a daily basis.  No change to plan of care at this time.  PMT would continue to follow and support patient throughout hospitalization.  Physical Exam Vitals reviewed.  Constitutional:      General: He is not in acute distress.    Appearance: He is obese.  HENT:     Head: Normocephalic.     Mouth/Throat:     Mouth: Mucous membranes are moist.  Eyes:     Pupils: Pupils are equal, round, and reactive to light.  Pulmonary:     Effort: Pulmonary  effort is normal.  Abdominal:     Palpations: Abdomen is soft.  Skin:    General: Skin is warm and dry.     Coloration: Skin is pale.  Neurological:     Mental Status: He is alert.     Comments: Oriented to self and place  Psychiatric:        Mood and Affect: Mood normal.        Behavior: Behavior normal.        Judgment: Judgment normal.             Total Time 25 minutes   Time spent includes: Detailed review of medical records (labs, imaging, vital signs), medically appropriate exam (mental status,  respiratory, cardiac, skin), discussed with treatment team, counseling and educating patient, family and staff, documenting clinical information, medication management and coordination of care.  Judeen Nose L. Rebbeca Campi, DNP, FNP-BC Palliative Medicine Team

## 2023-07-28 NOTE — Progress Notes (Signed)
 Nutrition Follow-up  DOCUMENTATION CODES:   Obesity unspecified  INTERVENTION:   -Continue 500 mg vitamin C  BID -Dysphagia 3 diet -Continue Ensure Enlive po TID, each supplement provides 350 kcal and 20 grams of protein -Continue Magic cup TID with meals, each supplement provides 290 kcal and 9 grams of protein  -Continue MVI with minerals daily  NUTRITION DIAGNOSIS:   Inadequate oral intake related to acute illness as evidenced by per patient/family report.  Ongoing  GOAL:   Patient will meet greater than or equal to 90% of their needs  Progressing   MONITOR:   PO intake, Supplement acceptance, Labs, Weight trends, I & O's, Skin  REASON FOR ASSESSMENT:   Consult Assessment of nutrition requirement/status  ASSESSMENT:   78 y/o male with h/o dementia, CAD, cardiac arrest s/p AICD, HTN, HLD, PAF, chronic wounds and CKD who is admitted with septic shock, AKI and AMS.  5/6- 2 dark stool and near syncopal event 5/7- s/p EGD- revealed small hiatal hernia, clotted blood in gastric fundus, non bleeding gastric ulcer, non-bleeding superficial duodenal ulcer with no stigmata of bleeding, advanced to clear liquids  5/8- advanced to soft diet 5/10- s/p EGD secondary to melena- revealed normal esophagitis, non-bleeding gastric ulcers, and duodenitis   Reviewed I/O's: -1 L x 24 hours and -4.1 L since 07/14/23  UOP: 1 L x 24 hours   Per GI notes, pt with no further evidence of GI bleeding. Pt reports feeling hungry.   Pt sleeping soundly at time of visit. Pt did not arouse to voice. No family at bedside.   No meal completion data available since 07/20/23. Pt is drinking Ensure supplements.   Wt has been stable over the past week and since admission.   Medications reviewed and include vitamin C , santyl , megace , and prednisone .   Per TOC notes, plan for SNF placement once medically stable.   Labs reviewed: CBGS: 98 (inpatient orders for glycemic control are none).    Diet  Order:   Diet Order             DIET SOFT Room service appropriate? Yes; Fluid consistency: Thin  Diet effective now                   EDUCATION NEEDS:   Education needs have been addressed  Skin:  Skin Assessment: Skin Integrity Issues: Skin Integrity Issues:: Stage II, Other (Comment) Stage II: buttocks Other: IAD to sacrum  Last BM:  07/27/23 (type 7)  Height:   Ht Readings from Last 1 Encounters:  07/10/23 5\' 3"  (1.6 m)    Weight:   Wt Readings from Last 1 Encounters:  07/28/23 89.5 kg    Ideal Body Weight:  56.3 kg  BMI:  Body mass index is 34.95 kg/m.  Estimated Nutritional Needs:   Kcal:  2000-2300kcal/day  Protein:  100-115g/day  Fluid:  1.5-1.7L/day    Herschel Lords, RD, LDN, CDCES Registered Dietitian III Certified Diabetes Care and Education Specialist If unable to reach this RD, please use "RD Inpatient" group chat on secure chat between hours of 8am-4 pm daily

## 2023-07-28 NOTE — Plan of Care (Signed)

## 2023-07-28 NOTE — Progress Notes (Signed)
 Physical Therapy Treatment Patient Details Name: Ethan Vega. MRN: [id number] DOB: 10/09/1945 Today's Date: 07/28/2023   History of Present Illness Patient is a 78 year old male with renal failure, toxic metabolic encephalopathy, septic shock with LE cellulitis. Required CRRT and pressor support. History of HTN, atrial fibrillation, ventricular tachycardia, AICD placement, dementia.    PT Comments  Pt received in bed, agreed to PT session. AA/AROM B LE's prior to transferring to EOB with MaxA, and heavy use of rail. Sitting EOB for ~10 minutes with supervision once properly positioned with feet on floor. No dizziness with change in position, Supine BP 89/58, HR 76, Sitting 99/59, HR 89. OT in to assist with co-tx due to pt's fear of falling and assist of 2 for transfers. Pt stood several times from bed to RW with MaxA of 2. Poor ability to attain upright standing due to weakness and fear of falling. High fall risk while turning to position self in front of chair requiring ModA to lower. Maxof 2 to reposition in chair. Pt positioned to comfort with edematous L UE elevated on pillow, all needs in reach.    If plan is discharge home, recommend the following: A lot of help with walking and/or transfers;A lot of help with bathing/dressing/bathroom;Assistance with cooking/housework;Assistance with feeding;Direct supervision/assist for medications management;Direct supervision/assist for financial management;Assist for transportation;Help with stairs or ramp for entrance;Supervision due to cognitive status   Can travel by private vehicle     No  Equipment Recommendations  None recommended by PT    Recommendations for Other Services       Precautions / Restrictions Precautions Precautions: Fall Recall of Precautions/Restrictions: Impaired Restrictions Weight Bearing Restrictions Per Provider Order: No     Mobility  Bed Mobility Overal bed mobility: Needs Assistance Bed Mobility: Supine  to Sit     Supine to sit: Max assist, HOB elevated, Used rails          Transfers Overall transfer level: Needs assistance Equipment used: Rolling walker (2 wheels) Transfers: Sit to/from Stand Sit to Stand: Max assist, +2 safety/equipment, From elevated surface           General transfer comment: Several sit<>stand transfers with MaxA of 2, difficulty attaining upright standing    Ambulation/Gait               General Gait Details: Unable to ambulate at this time   Stairs             Wheelchair Mobility     Tilt Bed    Modified Rankin (Stroke Patients Only)       Balance Overall balance assessment: Needs assistance Sitting-balance support: Feet supported, Bilateral upper extremity supported Sitting balance-Leahy Scale: Fair Sitting balance - Comments: anxious throughout Postural control: Posterior lean Standing balance support: Bilateral upper extremity supported, During functional activity, Reliant on assistive device for balance Standing balance-Leahy Scale: Zero Standing balance comment: pt remains high fall risk                            Communication Communication Communication: No apparent difficulties  Cognition Arousal: Alert Behavior During Therapy: Anxious   PT - Cognitive impairments: History of cognitive impairments                       PT - Cognition Comments: Pt with hx of dementia, oriented to self, very fearful of falling Following commands: Impaired Following commands impaired: Follows  one step commands with increased time    Cueing Cueing Techniques: Verbal cues, Tactile cues, Gestural cues, Visual cues  Exercises General Exercises - Lower Extremity Ankle Circles/Pumps: AROM, Both, 10 reps Heel Slides: AAROM, Both, 5 reps Other Exercises Other Exercises: Edu: benefits of mobility, hand placement throughout transfers, calming techniques    General Comments General comments (skin integrity,  edema, etc.): secure chat to nursing regarding use of Stedy for transfers      Pertinent Vitals/Pain Pain Assessment Pain Assessment: No/denies pain    Home Living                          Prior Function            PT Goals (current goals can now be found in the care plan section) Acute Rehab PT Goals Patient Stated Goal: regain strength for basic mobility    Frequency    Min 2X/week      PT Plan      Co-evaluation PT/OT/SLP Co-Evaluation/Treatment: Yes Reason for Co-Treatment: To address functional/ADL transfers;For patient/therapist safety;Necessary to address cognition/behavior during functional activity PT goals addressed during session: Mobility/safety with mobility OT goals addressed during session: ADL's and self-care      AM-PAC PT "6 Clicks" Mobility   Outcome Measure  Help needed turning from your back to your side while in a flat bed without using bedrails?: Total Help needed moving from lying on your back to sitting on the side of a flat bed without using bedrails?: Total Help needed moving to and from a bed to a chair (including a wheelchair)?: Total Help needed standing up from a chair using your arms (e.g., wheelchair or bedside chair)?: Total Help needed to walk in hospital room?: Total Help needed climbing 3-5 steps with a railing? : Total 6 Click Score: 6    End of Session Equipment Utilized During Treatment: Gait belt Activity Tolerance: Patient tolerated treatment well;Other (comment) Patient left: in chair;with call bell/phone within reach;with chair alarm set Nurse Communication: Mobility status;Other (comment) (Use of Stedy for transfers) PT Visit Diagnosis: Unsteadiness on feet (R26.81);Muscle weakness (generalized) (M62.81);Other abnormalities of gait and mobility (R26.89);Difficulty in walking, not elsewhere classified (R26.2)     Time: 6578-4696 PT Time Calculation (min) (ACUTE ONLY): 41 min  Charges:    $Therapeutic  Exercise: 8-22 mins $Therapeutic Activity: 8-22 mins PT General Charges $$ ACUTE PT VISIT: 1 Visit                    Melvyn Stagers, PTA  Diona Franklin 07/28/2023, 2:15 PM

## 2023-07-28 NOTE — Progress Notes (Signed)
 Ethan Sink, MD [facility]   [address] [place], [place] [address] Phone: [phone] Fax : [phone]   Subjective: The patient denies any further signs of GI bleeding but did have a drop in his hemoglobin.  The patient's hemoglobin yesterday was 7.9 that went down today to 7.0.  He denies any sign of any active GI bleeding.  The patient was not able to tell me accurately where he is at the present time and stated that the year was 2023.  He denies any abdominal pain fevers chills nausea or vomiting.  He does report that he is hungry.  The patient had a normal esophagus with a clean-based ulcer in the stomach and some duodenitis.   Objective: Vital signs in last 24 hours: Vitals:   07/28/23 0100 07/28/23 0500 07/28/23 0513 07/28/23 0812  BP: 106/65  (!) 93/57 (!) 94/56  Pulse:   69 63  Resp:   18 18  Temp: 98.4 F (36.9 C)  97.9 F (36.6 C) 98 F (36.7 C)  TempSrc: Oral     SpO2: 97%  100% 100%  Weight:  89.5 kg    Height:       Weight change: 2.5 kg  Intake/Output Summary (Last 24 hours) at 07/28/2023 0935 Last data filed at 07/28/2023 6962 Gross per 24 hour  Intake --  Output 1001 ml  Net -1001 ml     Exam: Heart:: Regular rate and rhythm or without murmur or extra heart sounds Lungs: normal and clear to auscultation and percussion Abdomen: soft, nontender, normal bowel sounds   Lab Results: @LABTEST2 @ Micro Results: Recent Results (from the past 240 hours)  Gastrointestinal Panel by PCR , Stool     Status: None   Collection Time: 07/23/23 10:41 AM   Specimen: Stool  Result Value Ref Range Status   Campylobacter species NOT DETECTED NOT DETECTED Final   Plesimonas shigelloides NOT DETECTED NOT DETECTED Final   Salmonella species NOT DETECTED NOT DETECTED Final   Yersinia enterocolitica NOT DETECTED NOT DETECTED Final   Vibrio species NOT DETECTED NOT DETECTED Final   Vibrio cholerae NOT DETECTED NOT DETECTED Final   Enteroaggregative E coli  (EAEC) NOT DETECTED NOT DETECTED Final   Enteropathogenic E coli (EPEC) NOT DETECTED NOT DETECTED Final   Enterotoxigenic E coli (ETEC) NOT DETECTED NOT DETECTED Final   Shiga like toxin producing E coli (STEC) NOT DETECTED NOT DETECTED Final   Shigella/Enteroinvasive E coli (EIEC) NOT DETECTED NOT DETECTED Final   Cryptosporidium NOT DETECTED NOT DETECTED Final   Cyclospora cayetanensis NOT DETECTED NOT DETECTED Final   Entamoeba histolytica NOT DETECTED NOT DETECTED Final   Giardia lamblia NOT DETECTED NOT DETECTED Final   Adenovirus F40/41 NOT DETECTED NOT DETECTED Final   Astrovirus NOT DETECTED NOT DETECTED Final   Norovirus GI/GII NOT DETECTED NOT DETECTED Final   Rotavirus A NOT DETECTED NOT DETECTED Final   Sapovirus (I, II, IV, and V) NOT DETECTED NOT DETECTED Final    Comment: Performed at [facility], [address], [place], [place] [address]   Studies/Results: No results found. Medications: I have reviewed the patient's current medications. Scheduled Meds:  vitamin C   500 mg Oral BID   collagenase    Topical Daily   feeding supplement  237 mL Oral TID BM   hydrocortisone   25 mg Rectal BID   levothyroxine   75 mcg Oral Q0600   lidocaine   1 patch Transdermal Q24H   lipase/protease/amylase  24,000 Units Oral TID  AC   liver oil-zinc  oxide   Topical BID   megestrol   400 mg Oral Daily   midodrine   10 mg Oral TID WC   multivitamin with minerals  1 tablet Oral Daily   pantoprazole  (PROTONIX ) IV  40 mg Intravenous Q12H   pneumococcal 20-valent conjugate vaccine  0.5 mL Intramuscular Tomorrow-1000   predniSONE   5 mg Oral Q breakfast   QUEtiapine   25 mg Oral QHS   Continuous Infusions: PRN Meds:.acetaminophen , docusate sodium , mouth rinse   Assessment: Principal Problem:   Septic shock (HCC) Active Problems:   NSVT (nonsustained ventricular tachycardia) (HCC)   Acquired hypothyroidism   Acute renal failure superimposed on chronic kidney disease (HCC)    Cellulitis of left foot   Pressure injury of skin   Toxic metabolic encephalopathy   Class 1 obesity   Failure to thrive in adult   Anorexia   Adrenal insufficiency (HCC)   Hyponatremia    Plan: This patient has a history of A-fib on anticoagulation with hypertension and heart failure.  The patient was found to have peptic ulcer disease on an EGD with a clean-based ulcer but continues to have a drop in his hemoglobin.  The patient had a colonoscopy 5 years ago that was unremarkable and imaging showing diverticulosis.  If the patient continues to drop his hemoglobin and then the patient may need to undergo a colonoscopy.  At this time I recommend following for another day to see if the hemoglobin recovers.   LOS: 18 days   Ethan Sink, MD.FACG 07/28/2023, 9:35 AM Pager [phone] 7am-5pm  Check AMION for 5pm -7am coverage and on weekends

## 2023-07-29 DIAGNOSIS — K625 Hemorrhage of anus and rectum: Secondary | ICD-10-CM

## 2023-07-29 DIAGNOSIS — A419 Sepsis, unspecified organism: Secondary | ICD-10-CM | POA: Diagnosis not present

## 2023-07-29 DIAGNOSIS — R710 Precipitous drop in hematocrit: Secondary | ICD-10-CM | POA: Diagnosis not present

## 2023-07-29 DIAGNOSIS — L03116 Cellulitis of left lower limb: Secondary | ICD-10-CM | POA: Diagnosis not present

## 2023-07-29 DIAGNOSIS — R6521 Severe sepsis with septic shock: Secondary | ICD-10-CM | POA: Diagnosis not present

## 2023-07-29 DIAGNOSIS — N179 Acute kidney failure, unspecified: Secondary | ICD-10-CM | POA: Diagnosis not present

## 2023-07-29 DIAGNOSIS — N171 Acute kidney failure with acute cortical necrosis: Secondary | ICD-10-CM | POA: Diagnosis not present

## 2023-07-29 LAB — BPAM RBC
Blood Product Expiration Date: 202506162359
ISSUE DATE / TIME: 202505121553
Unit Type and Rh: 5100

## 2023-07-29 LAB — CBC
HCT: 24.9 % — ABNORMAL LOW (ref 39.0–52.0)
Hemoglobin: 8 g/dL — ABNORMAL LOW (ref 13.0–17.0)
MCH: 30.9 pg (ref 26.0–34.0)
MCHC: 32.1 g/dL (ref 30.0–36.0)
MCV: 96.1 fL (ref 80.0–100.0)
Platelets: 120 10*3/uL — ABNORMAL LOW (ref 150–400)
RBC: 2.59 MIL/uL — ABNORMAL LOW (ref 4.22–5.81)
RDW: 18.7 % — ABNORMAL HIGH (ref 11.5–15.5)
WBC: 9.7 10*3/uL (ref 4.0–10.5)
nRBC: 0.5 % — ABNORMAL HIGH (ref 0.0–0.2)

## 2023-07-29 LAB — BASIC METABOLIC PANEL WITH GFR
Anion gap: 6 (ref 5–15)
BUN: 58 mg/dL — ABNORMAL HIGH (ref 8–23)
CO2: 22 mmol/L (ref 22–32)
Calcium: 8.4 mg/dL — ABNORMAL LOW (ref 8.9–10.3)
Chloride: 111 mmol/L (ref 98–111)
Creatinine, Ser: 1.73 mg/dL — ABNORMAL HIGH (ref 0.61–1.24)
GFR, Estimated: 39 mL/min — ABNORMAL LOW (ref >60)
Glucose, Bld: 102 mg/dL — ABNORMAL HIGH (ref 70–99)
Potassium: 4.6 mmol/L (ref 3.5–5.1)
Sodium: 139 mmol/L (ref 135–145)

## 2023-07-29 LAB — TYPE AND SCREEN
ABO/RH(D): O POS
Antibody Screen: NEGATIVE
Unit division: 0

## 2023-07-29 LAB — MAGNESIUM: Magnesium: 2.1 mg/dL (ref 1.7–2.4)

## 2023-07-29 MED ORDER — PANTOPRAZOLE SODIUM 40 MG PO TBEC
40.0000 mg | DELAYED_RELEASE_TABLET | Freq: Two times a day (BID) | ORAL | Status: DC
  Administered 2023-07-29 – 2023-08-01 (×6): 40 mg via ORAL
  Filled 2023-07-29 (×6): qty 1

## 2023-07-29 NOTE — Progress Notes (Signed)
 Palliative Care Progress Note, Assessment & Plan   Patient Name: Ethan Vega.       Date: 07/29/2023 DOB: Dec 17, 1945  Age: 78 y.o. MRN#: [id number] Attending Physician: Garrison Kanner, MD Primary Care Physician: Shari Daughters, MD Admit Date: 07/10/2023  Subjective: Patient is lying in bed, watching TV.  He acknowledges my presence and is able to make his wishes known.  No family or friends present during my visit.  HPI: 78 y.o. male  with past medical history of  essential hypertension, nonischemic cardiomyopathy history of paroxysmal A-fib and ventricular tachycardia, dyslipidemia. He presented to hospital 07/10/23 to have ICD generator changed. Staff found him to have open wounds to LLE, confusion and hypotension. He was subsequently sent to ED for evaluation. ED workup found patient to have AKI, leukocytosis and hypotension with creatinine 5.5 (baseline 1.7), Na+ 134, K+ 5.4, BUN 151, BNP 672.1, Trop 164, Lactic 2.2 and WBC 16.1.   He received IV fluid resuscitation but required pressor support. Daughter at bedside reported that patient lives alone, performing his ADLs independently. She adds that he complained of diarrhea and poor PO intake 2 weeks prior. Pt was unaware of the open sore to his LLE but did confirm chronic lower extremity pitting edema. He was admitted to ICU for circulatory shock with AKI.    Palliative care was consulted to discuss goals of care.  Summary of counseling/coordination of care: Extensive chart review completed prior to meeting patient including labs, vital signs, imaging, progress notes, orders, and available advanced directive documents from current and previous encounters.   After reviewing the patient's chart and assessing the patient at bedside, I spoke with patient  in regards to symptom management and goals of care.   Denies any physical complaints.  He shares he is "ready to close the curtains on my life".  He shares that he believes he is "going to die soon".  When asked to elaborate further, he shares that he takes medicine but it does not seem to be helping him.  He denies SI or plan at this time.  He shares she just feels like "my time is coming".  Space and opportunity provided for patient to share his thoughts and emotions.  He states he is tired and does not want anyone to continue to have to take care of him.  He says that "you girls are wonderful but I just do not want to be here".  Therapeutic silence, active listening, and emotional support provided.  Discussed patient's extended hospitalization with admission to ICU as well as CRRT, endoscopies, RBCs.  Reviewed patient's functional, nutritional, and cognitive status significant indicators of his overall prognosis.  Currently, I shared patient is not facing end-of-life.  He shares he does not believe he is dying today but that he feels the end is near.  Human mortality and quality of life reviewed.  Also highlighted that medical team is closely monitoring his hemoglobin but have plans to transfer him to rehab when stable.  He shares he is in agreement with this plan but just tired of being in the hospital.  Discussed pacemaker and ICD as well as DNR with limited interventions.  Patient reiterated that he  does not want any life-prolonging measures.  He shares that "when it is my time to go please let me go".  Discussed that ICD needs to be disabled in order to align with patient's wishes.  He shares "I love this idea "and "please make that happen".  After visiting with the patient, I counseled with his daughter Filipovic.  Buschman had initially requested ICD be disabled when patient was visiting with Dr. Rodolfo Clan prior to admission.  She is again requesting ICD be turned off.  Her request was made to Dr. Gordy Lauber and EP  is following closely to disable ICD.  No change to plan of care at this time.  PMT remains available patient and family throughout his hospitalization.  Physical Exam Vitals reviewed.  Constitutional:      General: He is not in acute distress. HENT:     Head: Normocephalic.     Mouth/Throat:     Mouth: Mucous membranes are moist.  Eyes:     Pupils: Pupils are equal, round, and reactive to light.  Pulmonary:     Effort: Pulmonary effort is normal.  Abdominal:     Palpations: Abdomen is soft.  Neurological:     Mental Status: He is alert.     Comments: Oriented to self and place  Psychiatric:        Mood and Affect: Mood normal.        Behavior: Behavior normal.             Total Time 50 minutes   Time spent includes: Detailed review of medical records (labs, imaging, vital signs), medically appropriate exam (mental status, respiratory, cardiac, skin), discussed with treatment team, counseling and educating patient, family and staff, documenting clinical information, medication management and coordination of care.  Judeen Nose L. Rebbeca Campi, DNP, FNP-BC Palliative Medicine Team

## 2023-07-29 NOTE — Progress Notes (Signed)
 Marnee Sink, MD [facility]   [address] [place], [place] [address] Phone: [phone] Fax : [phone]   Subjective: The patient's hemoglobin was 8 today.  The patient was quite confused this morning with him disconnecting his monitors.  The patient is being followed by palliative care medicine.   Objective: Vital signs in last 24 hours: Vitals:   07/29/23 0347 07/29/23 0500 07/29/23 0858 07/29/23 1606  BP: 105/67  111/74 95/65  Pulse: 71  96 80  Resp: 18  16 19   Temp: 98 F (36.7 C)  98.1 F (36.7 C) 98 F (36.7 C)  TempSrc: Oral     SpO2: 100%  100% 100%  Weight:  86.3 kg    Height:       Weight change: -3.2 kg  Intake/Output Summary (Last 24 hours) at 07/29/2023 1626 Last data filed at 07/29/2023 0900 Gross per 24 hour  Intake 384 ml  Output 1150 ml  Net -766 ml     Exam: Heart:: Regular rate and rhythm or without murmur or extra heart sounds Lungs: normal and clear to auscultation and percussion Abdomen: soft, nontender, normal bowel sounds   Lab Results: @LABTEST2 @ Micro Results: Recent Results (from the past 240 hours)  Gastrointestinal Panel by PCR , Stool     Status: None   Collection Time: 07/23/23 10:41 AM   Specimen: Stool  Result Value Ref Range Status   Campylobacter species NOT DETECTED NOT DETECTED Final   Plesimonas shigelloides NOT DETECTED NOT DETECTED Final   Salmonella species NOT DETECTED NOT DETECTED Final   Yersinia enterocolitica NOT DETECTED NOT DETECTED Final   Vibrio species NOT DETECTED NOT DETECTED Final   Vibrio cholerae NOT DETECTED NOT DETECTED Final   Enteroaggregative E coli (EAEC) NOT DETECTED NOT DETECTED Final   Enteropathogenic E coli (EPEC) NOT DETECTED NOT DETECTED Final   Enterotoxigenic E coli (ETEC) NOT DETECTED NOT DETECTED Final   Shiga like toxin producing E coli (STEC) NOT DETECTED NOT DETECTED Final   Shigella/Enteroinvasive E coli (EIEC) NOT DETECTED NOT DETECTED Final   Cryptosporidium NOT  DETECTED NOT DETECTED Final   Cyclospora cayetanensis NOT DETECTED NOT DETECTED Final   Entamoeba histolytica NOT DETECTED NOT DETECTED Final   Giardia lamblia NOT DETECTED NOT DETECTED Final   Adenovirus F40/41 NOT DETECTED NOT DETECTED Final   Astrovirus NOT DETECTED NOT DETECTED Final   Norovirus GI/GII NOT DETECTED NOT DETECTED Final   Rotavirus A NOT DETECTED NOT DETECTED Final   Sapovirus (I, II, IV, and V) NOT DETECTED NOT DETECTED Final    Comment: Performed at [facility], [address], [place], [place] [address]   Studies/Results: No results found. Medications: I have reviewed the patient's current medications. Scheduled Meds:  vitamin C   500 mg Oral BID   collagenase    Topical Daily   feeding supplement  237 mL Oral TID BM   levothyroxine   75 mcg Oral Q0600   lidocaine   1 patch Transdermal Q24H   lipase/protease/amylase  24,000 Units Oral TID AC   liver oil-zinc  oxide   Topical BID   megestrol   400 mg Oral Daily   midodrine   10 mg Oral TID WC   multivitamin with minerals  1 tablet Oral Daily   pantoprazole  (PROTONIX ) IV  40 mg Intravenous Q12H   pneumococcal 20-valent conjugate vaccine  0.5 mL Intramuscular Tomorrow-1000   predniSONE   5 mg Oral Q breakfast   QUEtiapine   25 mg Oral QHS   Continuous Infusions: PRN  Meds:.acetaminophen , docusate sodium , mouth rinse   Assessment: Principal Problem:   Septic shock (HCC) Active Problems:   NSVT (nonsustained ventricular tachycardia) (HCC)   Acquired hypothyroidism   Acute renal failure superimposed on chronic kidney disease (HCC)   Cellulitis of left foot   Pressure injury of skin   Toxic metabolic encephalopathy   Class 1 obesity   Failure to thrive in adult   Anorexia   Adrenal insufficiency (HCC)   Hyponatremia   Melena    Plan: The patient's hemoglobin was 7 yesterday was 8 today and it appears that he had received blood overnight.  Due to his continued confusion I would recommend monitoring  his blood counts since he is unlikely to comply with a GI prep for any colonic endoluminal evaluation at this time.  I will continue to follow along with you.   LOS: 19 days   Marnee Sink, MD.FACG 07/29/2023, 4:26 PM Pager [phone] 7am-5pm  Check AMION for 5pm -7am coverage and on weekends

## 2023-07-29 NOTE — Progress Notes (Signed)
 Patient found on morning round to have self d/c'd PIV's and disconnected cardiac monitoring.  Patient states, "I don't want anything, I'm going to the grave.  I don't want medicine, I just want to talk to my daughter."  Daughter, Ferrigno, telephoned and in to see patient with positive results.  Patient agreeable to medication.

## 2023-07-29 NOTE — Progress Notes (Signed)
 PROGRESS NOTE    Ethan Vega.  FAO:[id number] DOB: 07-Jan-1946 DOA: 07/10/2023 PCP: Shari Daughters, MD  126A/126A-AA  LOS: 19 days   Brief hospital course:   Assessment & Plan: Ethan Vega is a 78 year old male with a history of essential hypertension, nonischemic cardiomyopathy history of paroxysmal A-fib and ventricular tachycardia, dyslipidemia who was seen earlier today with electrophysiology service to have ICD generator change however he was noted to have open sores on his left lower extremity and was mentating with confusion and encephalopathy with decision to abort procedure and reschedule. He was subsequently sent to the ER. His workup revealed severe AKI and leukocytosis and vitals with shock physiology. Patient received IV fluid resuscitation but despite 3 L of intravenous fluids continues to require Levophed  support. He reports diarrhea over the past week. Patient currently lives alone in his home and was previously able to perform all activities of daily living. He is unaware of the open score and cellulitic lesion of his left foot.    Upper GI bleed 2/2 gastric and duodenal ulcer Eliquis  contributed to GI bleed  --EGD found clean based gastric and duodenal ulcers. --more bleeding night of 5/9.  CTA bleeding scan neg.  EGD 5/10 found healing ulcers. --monitor, if large amount of fresh bleed out from rectum, will order nuclear bleeding scan. --cont soft diet --cont IV PPI  Hypotension --cont midodrine   Acute blood loss anemia --s/p 5u pRBC so far --monitor Hgb and transfuse to keep Hgb >7  Septic shock secondary to left foot infection. Left foot ulcer with cellulitis. Elevated troponin secondary to septic shock. Adrenal insufficiency. Patient had received IV fluid bolus, he also received Levophed .  Currently off Levophed , blood pressure is borderline low, added midodrine .   Blood Culture has no growth. CT scan of the foot did not show any bony  involvement. He was seen by podiatry, no need for debridement.   Patient was treated with vancomycin , completed dose.  Then on Zosyn  to Augmentin  for additional 3 days to complete 10-day course.   --ulcers have almost all healed. Patient low blood pressure was also associated with adrenal insufficiency, cortisol level in the morning was only 3.5, started prednisone  5 mg daily.   Paroxysmal atrial fibrillation. Nonsustained ventricular tachycardia. Nonischemic cardiomyopathy with chronic systolic congestive heart failure with EF 30-35%. --hold anticoagulation   Failure to thrive. Anorexia. Chronic diarrhea. No longer has diarrhea on Creon . Also started Protonix , megestrol . Patient condition finally improving, appetite is better.    Acute kidney injury chronic kidney disease stage IIIa. Hyponatremia. Followed by nephrology, briefly needing dialysis.  Renal function had improved.     --monitor   Hyperkalemia --Lokelma  PRN  Right foot pain --US  DVT studies neg for DVT in BLE  Acute metabolic encephalopathy. Chronic dementia. Condition appears to be stable.   Debility. PT/OT recommended SNF rehab   Class 1 obesity. BMI 32.77 Patient currently has a very poor appetite.   Pressure ulcer POA Pressure Injury 07/11/23 Buttocks Left Stage 2 -  Partial thickness loss of dermis presenting as a shallow open injury with a red, pink wound bed without slough. (Active)  07/11/23 1049  Location: Buttocks  Location Orientation: Left  Staging: Stage 2 -  Partial thickness loss of dermis presenting as a shallow open injury with a red, pink wound bed without slough.  Wound Description (Comments):   Present on Admission: Yes     Pressure Injury 07/11/23 Buttocks Medial Stage 2 -  Partial thickness loss of dermis  presenting as a shallow open injury with a red, pink wound bed without slough. (Active)  07/11/23 1100  Location: Buttocks  Location Orientation: Medial  Staging: Stage 2 -   Partial thickness loss of dermis presenting as a shallow open injury with a red, pink wound bed without slough.  Wound Description (Comments):   Present on Admission: Yes    DVT prophylaxis: SCD/Compression stockings Code Status: DNR  Family Communication:  Level of care: Med-Surg Dispo:   The patient is from: home Anticipated d/c is to: SNF rehab Anticipated d/c date is: 2-3 days, if no more GI bleed and Hgb stable   Subjective and Interval History:  RN reported pt pulled out lines and IV and said he was going to die and refused everything.  After talking with his daughter, pt said he would continue tx and get better for his daughter.   Objective: Vitals:   07/29/23 0500 07/29/23 0858 07/29/23 1606 07/29/23 2043  BP:  111/74 95/65 (!) 103/59  Pulse:  96 80 70  Resp:  16 19 18   Temp:  98.1 F (36.7 C) 98 F (36.7 C) 98 F (36.7 C)  TempSrc:      SpO2:  100% 100% 100%  Weight: 86.3 kg     Height:        Intake/Output Summary (Last 24 hours) at 07/29/2023 2118 Last data filed at 07/29/2023 0900 Gross per 24 hour  Intake 85.83 ml  Output 650 ml  Net -564.17 ml   Filed Weights   07/27/23 0441 07/28/23 0500 07/29/23 0500  Weight: 87 kg 89.5 kg 86.3 kg    Examination:   Constitutional: NAD, alert, oriented to person and place HEENT: conjunctivae and lids normal, EOMI CV: No cyanosis.   RESP: normal respiratory effort Extremities: some edema in BLE SKIN: warm, dry Neuro: II - XII grossly intact.     Data Reviewed: I have personally reviewed labs and imaging studies  Time spent: 35 minutes  Ethan Kanner, MD [facility] If 7PM-7AM, please contact night-coverage 07/29/2023, 9:18 PM

## 2023-07-30 DIAGNOSIS — N179 Acute kidney failure, unspecified: Secondary | ICD-10-CM | POA: Diagnosis not present

## 2023-07-30 DIAGNOSIS — K279 Peptic ulcer, site unspecified, unspecified as acute or chronic, without hemorrhage or perforation: Secondary | ICD-10-CM

## 2023-07-30 DIAGNOSIS — L03116 Cellulitis of left lower limb: Secondary | ICD-10-CM | POA: Diagnosis not present

## 2023-07-30 DIAGNOSIS — K625 Hemorrhage of anus and rectum: Secondary | ICD-10-CM

## 2023-07-30 DIAGNOSIS — R6521 Severe sepsis with septic shock: Secondary | ICD-10-CM | POA: Diagnosis not present

## 2023-07-30 DIAGNOSIS — A419 Sepsis, unspecified organism: Secondary | ICD-10-CM | POA: Diagnosis not present

## 2023-07-30 LAB — BASIC METABOLIC PANEL WITH GFR
Anion gap: 3 — ABNORMAL LOW (ref 5–15)
BUN: 47 mg/dL — ABNORMAL HIGH (ref 8–23)
CO2: 25 mmol/L (ref 22–32)
Calcium: 8.4 mg/dL — ABNORMAL LOW (ref 8.9–10.3)
Chloride: 109 mmol/L (ref 98–111)
Creatinine, Ser: 1.44 mg/dL — ABNORMAL HIGH (ref 0.61–1.24)
GFR, Estimated: 49 mL/min — ABNORMAL LOW (ref >60)
Glucose, Bld: 111 mg/dL — ABNORMAL HIGH (ref 70–99)
Potassium: 4.6 mmol/L (ref 3.5–5.1)
Sodium: 137 mmol/L (ref 135–145)

## 2023-07-30 LAB — CBC
HCT: 25 % — ABNORMAL LOW (ref 39.0–52.0)
Hemoglobin: 8.2 g/dL — ABNORMAL LOW (ref 13.0–17.0)
MCH: 31.8 pg (ref 26.0–34.0)
MCHC: 32.8 g/dL (ref 30.0–36.0)
MCV: 96.9 fL (ref 80.0–100.0)
Platelets: 134 10*3/uL — ABNORMAL LOW (ref 150–400)
RBC: 2.58 MIL/uL — ABNORMAL LOW (ref 4.22–5.81)
RDW: 18.6 % — ABNORMAL HIGH (ref 11.5–15.5)
WBC: 8.8 10*3/uL (ref 4.0–10.5)
nRBC: 0.2 % (ref 0.0–0.2)

## 2023-07-30 LAB — MAGNESIUM: Magnesium: 2.3 mg/dL (ref 1.7–2.4)

## 2023-07-30 NOTE — Progress Notes (Signed)
 Physical Therapy Treatment Patient Details Name: Ethan Vega. MRN: [id number] DOB: 1945-07-29 Today's Date: 07/30/2023   History of Present Illness Patient is a 78 year old male with renal failure, toxic metabolic encephalopathy, septic shock with LE cellulitis. Required CRRT and pressor support. History of HTN, atrial fibrillation, ventricular tachycardia, AICD placement, dementia.    PT Comments  Pt seen earlier today for co-tx with OT for safe mobility and transfer out of bed. Pt requires extensive assist for bed mobility and sit<>stand due to weakness and anxiety from fear of falling. Therefore decision made to utilize Dwana Gist for transfers and transition to bedside chair. Pt tolerated session very well, good tolerance for prolonged sitting EOB without LOB. VSS throughout session. Pt tolerated 1+ hour OOB prior to being assisted back via [place]. Will continue to progress acutely.   If plan is discharge home, recommend the following: A lot of help with walking and/or transfers;A lot of help with bathing/dressing/bathroom;Assistance with cooking/housework;Assistance with feeding;Direct supervision/assist for medications management;Direct supervision/assist for financial management;Assist for transportation;Help with stairs or ramp for entrance;Supervision due to cognitive status   Can travel by private vehicle     No  Equipment Recommendations  None recommended by PT    Recommendations for Other Services       Precautions / Restrictions Precautions Precautions: Fall Recall of Precautions/Restrictions: Impaired Restrictions Weight Bearing Restrictions Per Provider Order: No     Mobility  Bed Mobility Overal bed mobility: Needs Assistance Bed Mobility: Supine to Sit     Supine to sit: Max assist, HOB elevated, Used rails          Transfers Overall transfer level: Needs assistance Equipment used: Ambulation equipment used Transfers: Sit to/from Stand, Bed to  chair/wheelchair/BSC Sit to Stand: From elevated surface, +2 physical assistance, +2 safety/equipment, Via lift equipment, Max assist           General transfer comment: elevated bed, cues for hand placement and overall technique. benefits from step by step explanation to decrease anxiety Transfer via Lift Equipment: Stedy  Ambulation/Gait                   Stairs             Wheelchair Mobility     Tilt Bed    Modified Rankin (Stroke Patients Only)       Balance Overall balance assessment: Needs assistance Sitting-balance support: Feet supported, Single extremity supported Sitting balance-Leahy Scale: Fair Sitting balance - Comments: can sit with 1 unilateral hand support, holding on to bed rail for safety per pt preference       Standing balance comment: Tolerates standing in [place] - briefly with ModA                            Communication Communication Communication: No apparent difficulties  Cognition Arousal: Alert Behavior During Therapy: Anxious   PT - Cognitive impairments: History of cognitive impairments                       PT - Cognition Comments: Pt with hx of dementia, oriented to self, very fearful of falling Following commands: Impaired Following commands impaired: Follows one step commands with increased time    Cueing Cueing Techniques: Verbal cues, Tactile cues, Gestural cues, Visual cues  Exercises Other Exercises Other Exercises: Pt educated on benefits of OOB activity and use of Stedy    General Comments  General comments (skin integrity, edema, etc.): VSS throughout session, NP in to deactivate ICD defibulator at pt request      Pertinent Vitals/Pain Pain Assessment Pain Assessment: No/denies pain    Home Living                          Prior Function            PT Goals (current goals can now be found in the care plan section) Acute Rehab PT Goals Patient Stated Goal: regain  strength for basic mobility    Frequency    Min 2X/week      PT Plan      Co-evaluation   Reason for Co-Treatment: To address functional/ADL transfers;For patient/therapist safety;Necessary to address cognition/behavior during functional activity PT goals addressed during session: Mobility/safety with mobility OT goals addressed during session: ADL's and self-care      AM-PAC PT "6 Clicks" Mobility   Outcome Measure  Help needed turning from your back to your side while in a flat bed without using bedrails?: Total Help needed moving from lying on your back to sitting on the side of a flat bed without using bedrails?: Total Help needed moving to and from a bed to a chair (including a wheelchair)?: Total Help needed standing up from a chair using your arms (e.g., wheelchair or bedside chair)?: Total Help needed to walk in hospital room?: Total Help needed climbing 3-5 steps with a railing? : Total 6 Click Score: 6    End of Session Equipment Utilized During Treatment: Gait belt Activity Tolerance: Patient tolerated treatment well;Other (comment) Patient left: in chair;with call bell/phone within reach;with chair alarm set Nurse Communication: Mobility status;Other (comment) PT Visit Diagnosis: Unsteadiness on feet (R26.81);Muscle weakness (generalized) (M62.81);Other abnormalities of gait and mobility (R26.89);Difficulty in walking, not elsewhere classified (R26.2)     Time: 8295-6213 PT Time Calculation (min) (ACUTE ONLY): 41 min  Charges:    $Therapeutic Activity: 23-37 mins PT General Charges $$ ACUTE PT VISIT: 1 Visit                    Melvyn Stagers, PTA  Diona Franklin 07/30/2023, 3:41 PM

## 2023-07-30 NOTE — Progress Notes (Signed)
 Palliative Care Progress Note, Assessment & Plan   Patient Name: Ethan Vega.       Date: 07/30/2023 DOB: 1945/07/11  Age: 78 y.o. MRN#: [id number] Attending Physician: Melodi Sprung, DO Primary Care Physician: Shari Daughters, MD Admit Date: 07/10/2023  Subjective: Patient is lying in bed, sleeping, in no apparent distress.  He easily awakens to my presence but returns to sleep.  No family or friends present bedside during my visit.  HPI: 78 y.o. male  with past medical history of  essential hypertension, nonischemic cardiomyopathy history of paroxysmal A-fib and ventricular tachycardia, dyslipidemia. He presented to hospital 07/10/23 to have ICD generator changed. Staff found him to have open wounds to LLE, confusion and hypotension. He was subsequently sent to ED for evaluation. ED workup found patient to have AKI, leukocytosis and hypotension with creatinine 5.5 (baseline 1.7), Na+ 134, K+ 5.4, BUN 151, BNP 672.1, Trop 164, Lactic 2.2 and WBC 16.1.   He received IV fluid resuscitation but required pressor support. Daughter at bedside reported that patient lives alone, performing his ADLs independently. She adds that he complained of diarrhea and poor PO intake 2 weeks prior. Pt was unaware of the open sore to his LLE but did confirm chronic lower extremity pitting edema. He was admitted to ICU for circulatory shock with AKI.    Palliative care was consulted to discuss goals of care.  Summary of counseling/coordination of care: Extensive chart review completed prior to meeting patient including labs, vital signs, imaging, progress notes, orders, and available advanced directive documents from current and previous encounters.   After reviewing the patient's chart, I assessed patient's symptoms.  He has no signs of distress, is resting comfortably. No adjustment to [facility] needed at this time.   After assessing the patient at bedside, I CP.  They share they visited with patient earlier today and he was unable to fully understand disabling his ICD.  Discussed with EP that patient remains unable to participate in medical decision making independently at this time given confusion.  EP plans to speak with daughter/decision maker later.  From a palliative perspective, goals are clear and symptom burden is low.  Patient is next of kin decision maker is his daughter Zhang, who has PMT contact info and has been given a MOST form should she choose to complete it to further clarify patient's goals/wishes.  DNR with limited interventions remains.    PMT will step back from daily visits.  Please re-engage with PMT if goals change, at patient/family's request, or if patient's health deteriorates during this hospitalization.  Physical Exam Vitals reviewed.  Constitutional:      General: He is not in acute distress.    Appearance: Normal appearance.  HENT:     Head: Normocephalic.     Nose: Nose normal.  Cardiovascular:     Pulses: Normal pulses.  Pulmonary:     Effort: Pulmonary effort is normal.  Abdominal:     Palpations: Abdomen is soft.  Skin:    General: Skin is warm and dry.  Psychiatric:        Mood and Affect: Mood normal.        Behavior: Behavior normal.  Total Time 25 minutes   Time spent includes: Detailed review of medical records (labs, imaging, vital signs), medically appropriate exam (mental status, respiratory, cardiac, skin), discussed with treatment team, counseling and educating patient, family and staff, documenting clinical information, medication management and coordination of care.  Judeen Nose L. Rebbeca Campi, DNP, FNP-BC Palliative Medicine Team

## 2023-07-30 NOTE — Progress Notes (Signed)
 EP notified by palliative care NP Strup regarding patient's wishes to turn OFF ICD therapies.   I discussed with patient and with patient's daughter, [name], who confirmed their desire.   Device at ERI, as previously identified LRL at 50bpm, Rare AP ~3% Very rare VP < 1%  ICD therapies turned OFF.   Copy of device interrogation placed in patient's chart.    Ethan Ricotta, NP Electrophysiology 07/30/23 12:36 PM

## 2023-07-30 NOTE — Progress Notes (Signed)
 Occupational Therapy Treatment Patient Details Name: Ethan Vega. MRN: [id number] DOB: 01-Apr-1945 Today's Date: 07/30/2023   History of present illness Patient is a 78 year old male with renal failure, toxic metabolic encephalopathy, septic shock with LE cellulitis. Required CRRT and pressor support. History of HTN, atrial fibrillation, ventricular tachycardia, AICD placement, dementia.   OT comments  OT session focused on functional transfers and seated ADLs. Pt received in recliner, grateful to have assistance via Stedy to return to bed safely due to his fear of falling. MOD A +2 to stand from recliner using Stedy. Able to sit EOB with BLE supported for UB/partial LB sponge bath. Pt occasionally verbalizes feeling like he is falling despite no LOB, reassurance provided. Pt returned to supine with MAX A for trunk and BLE. Pt making progress towards goals, OT will continue to follow.       If plan is discharge home, recommend the following:  A lot of help with bathing/dressing/bathroom;Supervision due to cognitive status;Assist for transportation;Help with stairs or ramp for entrance;Two people to help with walking and/or transfers   Equipment Recommendations  Other (comment)       Precautions / Restrictions Precautions Precautions: Fall Recall of Precautions/Restrictions: Impaired Restrictions Weight Bearing Restrictions Per Provider Order: No       Mobility Bed Mobility Overal bed mobility: Needs Assistance Bed Mobility: Sit to Supine     Supine to sit: Max assist, HOB elevated, Used rails Sit to supine: Max assist, HOB elevated, Used rails   General bed mobility comments: assist for trunk and BLE    Transfers Overall transfer level: Needs assistance Equipment used: Ambulation equipment used Transfers: Sit to/from Stand, Bed to chair/wheelchair/BSC Sit to Stand: From elevated surface, +2 physical assistance, +2 safety/equipment, Via lift equipment, Mod assist            General transfer comment: modA +2 to stand from recliner using stedy, pt expressing gratitude for time and use of stedy to help with fear Transfer via Lift Equipment: Stedy   Balance Overall balance assessment: Needs assistance Sitting-balance support: Feet supported, Single extremity supported Sitting balance-Leahy Scale: Fair Sitting balance - Comments: can maintain seated EOB without UE support for brief periods for seated sponge bath       Standing balance comment: pt remains high fall risk                           ADL either performed or assessed with clinical judgement   ADL Overall ADL's : Needs assistance/impaired     Grooming: Sitting;Wash/dry face;Wash/dry hands Grooming Details (indicate cue type and reason): sitting EOB Upper Body Bathing: Set up;Sitting Upper Body Bathing Details (indicate cue type and reason): OT washes back, pt washes front with unilateral hand support on bed Lower Body Bathing: Set up;Sitting/lateral leans Lower Body Bathing Details (indicate cue type and reason): washes top of thighs seated EOB Upper Body Dressing : Minimal assistance;Sitting   Lower Body Dressing: Maximal assistance;Sit to/from stand Lower Body Dressing Details (indicate cue type and reason): sitting EOB             Functional mobility during ADLs: Cueing for safety;Cueing for sequencing;+2 for safety/equipment;Maximal assistance General ADL Comments: seated sponge bath EOB     Communication Communication Communication: No apparent difficulties   Cognition Arousal: Alert Behavior During Therapy: Anxious Cognition: Cognition impaired   Orientation impairments: Place, Time, Situation Awareness: Intellectual awareness impaired, Online awareness impaired Memory impairment (select all  impairments): Short-term memory, Working [employer] fast stream[employer], Copywriter, advertising, Engineer, structural memory Attention impairment (select first level of impairment):  Sustained attention Executive functioning impairment (select all impairments): Reasoning, Problem solving OT - Cognition Comments: improved anxiety, seems calmer when provided with detailed explainations of events and reassurance.                 Following commands: Impaired Following commands impaired: Follows one step commands with increased time      Cueing   Cueing Techniques: Verbal cues, Tactile cues, Gestural cues, Visual cues  Exercises Other Exercises Other Exercises: OT POC and goals extended +2 wks       General Comments VSS    Pertinent Vitals/ Pain       Pain Assessment Pain Assessment: No/denies pain Pain Score: 0-No pain   Frequency  Min 2X/week        Progress Toward Goals  OT Goals(current goals can now be found in the care plan section)  Progress towards OT goals: Progressing toward goals  Acute Rehab OT Goals OT Goal Formulation: With patient Time For Goal Achievement: 08/13/23 Potential to Achieve Goals: Fair ADL Goals Pt Will Perform Grooming: with supervision;sitting Pt Will Perform Lower Body Dressing: with min assist;sitting/lateral leans;sit to/from stand Pt Will Transfer to Toilet: with min assist;ambulating Pt Will Perform Toileting - Clothing Manipulation and hygiene: with min assist;sitting/lateral leans;sit to/from stand  Plan      Co-evaluation    PT/OT/SLP Co-Evaluation/Treatment: Yes Reason for Co-Treatment: To address functional/ADL transfers;For patient/therapist safety;Necessary to address cognition/behavior during functional activity PT goals addressed during session: Mobility/safety with mobility OT goals addressed during session: ADL's and self-care      AM-PAC OT "6 Clicks" Daily Activity     Outcome Measure   Help from another person eating meals?: A Little Help from another person taking care of personal grooming?: A Lot Help from another person toileting, which includes using toliet, bedpan, or urinal?: A  Lot Help from another person bathing (including washing, rinsing, drying)?: A Lot Help from another person to put on and taking off regular upper body clothing?: A Little Help from another person to put on and taking off regular lower body clothing?: Total 6 Click Score: 13    End of Session Equipment Utilized During Treatment: Gait belt;Other (comment) (stedy)  OT Visit Diagnosis: Other abnormalities of gait and mobility (R26.89);Muscle weakness (generalized) (M62.81)   Activity Tolerance Patient tolerated treatment well   Patient Left in bed;with call bell/phone within reach;with bed alarm set   Nurse Communication Mobility status        Time: 9629-5284 OT Time Calculation (min): 20 min  Charges: OT General Charges $OT Visit: 1 Visit OT Treatments $Self Care/Home Management : 8-22 mins Rajiv Parlato L. Sharrod Achille, OTR/L  07/30/23, 4:01 PM

## 2023-07-30 NOTE — Progress Notes (Signed)
 Occupational Therapy Treatment Patient Details Name: Ethan Vega. MRN: [id number] DOB: Aug 16, 1945 Today's Date: 07/30/2023   History of present illness Patient is a 78 year old male with renal failure, toxic metabolic encephalopathy, septic shock with LE cellulitis. Required CRRT and pressor support. History of HTN, atrial fibrillation, ventricular tachycardia, AICD placement, dementia.   OT comments  Pt seen for PT/OT co-tx this date. Improved anxiety with detailed explanation and reassurance provided throughout. Pt pleasantly confused, talking to his stuffed animal when asked how he is feeling today. Limited by generalized weakness and anxiety due to fear of falling. Pt tolerated sitting EOB 15+ mins for back to be washed and socks donned. Stedy used to facilitate transfer from bed to recliner, pt required MOD - MAX A +2 to clear buttocks from elevated bed surface. Left in recliner, needs in reach with lunch setup (OT/PT communicated with RN + NT regarding pt status, and therapy to return after lunch to transfer pt back to bed per request). OT POC updated +2 weeks, pt making progress towards goals. OT will continue to follow.        If plan is discharge home, recommend the following:  A lot of help with bathing/dressing/bathroom;Supervision due to cognitive status;Assist for transportation;Help with stairs or ramp for entrance;Two people to help with walking and/or transfers   Equipment Recommendations  Other (comment)    Recommendations for Other Services      Precautions / Restrictions Precautions Precautions: Fall Recall of Precautions/Restrictions: Impaired Restrictions Weight Bearing Restrictions Per Provider Order: No       Mobility Bed Mobility Overal bed mobility: Needs Assistance Bed Mobility: Supine to Sit     Supine to sit: Max assist, HOB elevated, Used rails          Transfers Overall transfer level: Needs assistance Equipment used: Ambulation equipment  used Transfers: Sit to/from Stand, Bed to chair/wheelchair/BSC Sit to Stand: From elevated surface, +2 physical assistance, +2 safety/equipment, Via lift equipment, Max assist           General transfer comment: elevated bed, cues for hand placement and overall technique. benefits from step by step explanation to decrease anxiety Transfer via Lift Equipment: Stedy   Balance Overall balance assessment: Needs assistance Sitting-balance support: Feet supported, Single extremity supported Sitting balance-Leahy Scale: Fair Sitting balance - Comments: can sit with 1 unilateral hand support, holding on to bed rail for safety per pt preference       Standing balance comment: pt remains high fall risk                           ADL either performed or assessed with clinical judgement   ADL Overall ADL's : Needs assistance/impaired                     Lower Body Dressing: Maximal assistance;Sit to/from stand Lower Body Dressing Details (indicate cue type and reason): sitting EOB             Functional mobility during ADLs: Cueing for safety;Cueing for sequencing;+2 for safety/equipment;Maximal assistance General ADL Comments: improved tolerance to sitting EOB for back to be washed and MD exam, anxious but requires unilaterjal hand support for reassurance     Communication Communication Communication: No apparent difficulties   Cognition Arousal: Alert Behavior During Therapy: Anxious Cognition: Cognition impaired   Orientation impairments: Place, Time, Situation Awareness: Intellectual awareness impaired, Online awareness impaired Memory impairment (select all impairments):  Short-term memory, Working [employer] fast streamer, Copywriter, advertising, Engineer, structural memory Attention impairment (select first level of impairment): Sustained attention Executive functioning impairment (select all impairments): Reasoning, Problem solving OT - Cognition Comments:  improved anxiety, seems calmer when provided with detailed explainations of events and reassurance.                 Following commands: Impaired Following commands impaired: Follows one step commands with increased time      Cueing   Cueing Techniques: Verbal cues, Tactile cues, Gestural cues, Visual cues  Exercises Other Exercises Other Exercises: OT POC and goals extended +2 wks    Shoulder Instructions       General Comments NP and MD in room during session. On RA ([employer] donned around neck upon arrival, O2 sats 98% with HR 77). Orders state to don post-op shoe; elected not to don shoe due to location of ulcer (dorsal portion of foot, straps would interfere with wound, MD aware)    Pertinent Vitals/ Pain       Pain Assessment Pain Assessment: No/denies pain Pain Score: 0-No pain   Frequency  Min 2X/week        Progress Toward Goals  OT Goals(current goals can now be found in the care plan section)  Progress towards OT goals: Progressing toward goals  Acute Rehab OT Goals OT Goal Formulation: With patient Time For Goal Achievement: 08/13/23 Potential to Achieve Goals: Fair ADL Goals Pt Will Perform Grooming: with supervision;sitting Pt Will Perform Lower Body Dressing: with min assist;sitting/lateral leans;sit to/from stand Pt Will Transfer to Toilet: with min assist;ambulating Pt Will Perform Toileting - Clothing Manipulation and hygiene: with min assist;sitting/lateral leans;sit to/from stand  Plan      Co-evaluation    PT/OT/SLP Co-Evaluation/Treatment: Yes Reason for Co-Treatment: To address functional/ADL transfers;For patient/therapist safety;Necessary to address cognition/behavior during functional activity PT goals addressed during session: Mobility/safety with mobility OT goals addressed during session: ADL's and self-care      AM-PAC OT "6 Clicks" Daily Activity     Outcome Measure   Help from another person eating meals?: A Little Help from  another person taking care of personal grooming?: A Lot Help from another person toileting, which includes using toliet, bedpan, or urinal?: A Lot Help from another person bathing (including washing, rinsing, drying)?: A Lot Help from another person to put on and taking off regular upper body clothing?: A Little Help from another person to put on and taking off regular lower body clothing?: Total 6 Click Score: 13    End of Session Equipment Utilized During Treatment: Gait belt;Other (comment) (stedy)  OT Visit Diagnosis: Other abnormalities of gait and mobility (R26.89);Muscle weakness (generalized) (M62.81)   Activity Tolerance Patient tolerated treatment well   Patient Left in chair;with chair alarm set;with call bell/phone within reach   Nurse Communication Mobility status;Other (comment);Need for lift equipment (VS)        Time: 1610-9604 OT Time Calculation (min): 46 min  Charges: OT General Charges $OT Visit: 1 Visit OT Treatments $Self Care/Home Management : 8-22 mins  Izola Teague L. Nasire Reali, OTR/L  07/30/23, 3:13 PM

## 2023-07-30 NOTE — Progress Notes (Addendum)
 PROGRESS NOTE    Ethan Vega.   ZOX:[id number] DOB: 11/08/1945  DOA: 07/10/2023 Date of Service: 07/30/23 which is hospital day 20  PCP: Ethan Daughters, MD     Hospital course / significant events:  Ethan Vega is an 78 year old male with a history of essential hypertension, nonischemic cardiomyopathy history of paroxysmal A-fib and ventricular tachycardia, dyslipidemia. He reports diarrhea over the past week. Patient currently lives alone in his home and was previously able to perform all activities of daily living. He is unaware of the open score and cellulitic lesion of his left foot.   Initially presented for elective procedure to have ICD generator change however he was noted to have open sores on his left lower extremity and was demonstrating confusion and encephalopathy - decision to abort procedure and reschedule. He was subsequently sent to the ER.   His workup revealed severe AKI and leukocytosis and vitals with shock physiology. Patient received IV fluid resuscitation but despite 3 L required Levophed  support. Kept inpatient for abx. Also developed GI bleed d/t gastric and duodenal ulcers w/ eliquis . Eliquis  held. EGD 05/07 found clean based gastric and duodenal ulcers, had more bleeding night of 5/9 CTA bleeding scan neg.  EGD 5/10 found healing ulcers. Needing PRBC transfusion 05/12 but Hgb stable since then (if dropping per GI may need to consider colonoscopy)     Consultants:  Gastroenterology  Podiatry  Nephrology Palliative Care   Procedures/Surgeries: 07/23/23 EGD 07/26/23: EGD     ASSESSMENT & PLAN:   Upper GI bleed 2/2 gastric and duodenal ulcer Eliquis  contributed to GI bleed  EGD found clean based gastric and duodenal ulcers. monitor, if significant fresh bleed out from rectum, will order nuclear bleeding scan. cont soft diet cont IV PPI Hold anticoagulation Monitor CBC GI has s/o at this time, reengage as needed   Hypotension cont  midodrine    Acute blood loss anemia s/p 5u pRBC so far monitor Hgb and transfuse to keep Hgb >7 See above re: GIB   Septic shock secondary to left foot infection, complicated by adrenal insufficiency. Left foot ulcer with cellulitis - nearly healed / healing normally. Elevated troponin secondary to septic shock. Adrenal insufficiency. Blood Culture has no growth. CT scan of the foot did not show any bony involvement. He was seen by podiatry, no need for debridement.   Patient was treated with vancomycin , completed dose.  Then on Zosyn  to Augmentin  for additional 3 days to complete 10-day course.   Received steroids    Paroxysmal atrial fibrillation. Nonsustained ventricular tachycardia. Nonischemic cardiomyopathy with chronic systolic congestive heart failure with EF 30-35%. hold anticoagulation Hold beta blocker d/t soft BP, heart rate has been at goal    Failure to thrive. Anorexia, improving Chronic diarrhea. No longer has diarrhea on Creon , continue this. started Protonix , megestrol .   Acute kidney injury chronic kidney disease stage IIIa. Hyponatremia. Followed by nephrology, briefly needing dialysis.  Renal function had improved.     Monitor BMP   Hyperkalemia Lokelma  PRN Monitor BMP   Right foot pain US  DVT studies neg for DVT in BLE   Acute metabolic encephalopathy. Chronic dementia. Condition appears to be stable Delirium precautions.   Debility. PT/OT recommended SNF rehab, TOC following      Pressure ulcer POA Pressure Injury 07/11/23 Buttocks Left Stage 2 -  Partial thickness loss of dermis presenting as a shallow open injury with a red, pink wound bed without slough. (Active)  07/11/23 1049  Location: Buttocks  Location Orientation: Left  Staging: Stage 2 -  Partial thickness loss of dermis presenting as a shallow open injury with a red, pink wound bed without slough.  Wound Description (Comments):   Present on Admission: Yes     Pressure Injury  07/11/23 Buttocks Medial Stage 2 -  Partial thickness loss of dermis presenting as a shallow open injury with a red, pink wound bed without slough. (Active)  07/11/23 1100  Location: Buttocks  Location Orientation: Medial  Staging: Stage 2 -  Partial thickness loss of dermis presenting as a shallow open injury with a red, pink wound bed without slough.  Wound Description (Comments):   Present on Admission: Yes   wound care continue       Class 1 obesity based on BMI: Body mass index is 33.7 kg/m.Ethan Vega Significantly low or high BMI is associated with higher medical risk.  Underweight - under 18  overweight - 25 to 29 obese - 30 or more Class 1 obesity: BMI of 30.0 to 34 Class 2 obesity: BMI of 35.0 to 39 Class 3 obesity: BMI of 40.0 to 49 Super Morbid Obesity: BMI 50-59 Super-super Morbid Obesity: BMI 60+ Healthy nutrition and physical activity advised as adjunct to other disease management and risk reduction treatments    DVT prophylaxis: holding given GI bleed IV fluids: no continuous IV fluids  Nutrition: regular Central lines / other devices: none  Code Status: DNR ACP documentation reviewed:  none on file in [facility]  TOC needs: placement  Medical barriers to dispo: Expected medical readiness for discharge tomorrow/Friday if Hgb remains stable / no bleeding.              Subjective / Brief ROS:  Patient reports no concerns at this time other than can't find the "string that goes to my nose" (nasal canula for O2 has been removed) Denies CP/SOB.  Pain controlled.  Denies new weakness.  Tolerating diet.  Reports no concerns w/ urination/defecation.   Family Communication: called 07/30/23 4:11 PM spoke w/ daughter Khawaja and all questions answered     Objective Findings:  Vitals:   07/29/23 1606 07/29/23 2043 07/30/23 0510 07/30/23 0825  BP: 95/65 (!) 103/59 115/71 99/76  Pulse: 80 70 71 70  Resp: 19 18 19 18   Temp: 98 F (36.7 C) 98 F (36.7 C) 97.7 F  (36.5 C) 97.9 F (36.6 C)  TempSrc:      SpO2: 100% 100% 100% 100%  Weight:      Height:        Intake/Output Summary (Last 24 hours) at 07/30/2023 1611 Last data filed at 07/30/2023 0510 Gross per 24 hour  Intake --  Output 400 ml  Net -400 ml   Filed Weights   07/27/23 0441 07/28/23 0500 07/29/23 0500  Weight: 87 kg 89.5 kg 86.3 kg    Examination:  Physical Exam Constitutional:      General: He is not in acute distress.    Appearance: He is not ill-appearing.  Cardiovascular:     Rate and Rhythm: Normal rate and regular rhythm.  Pulmonary:     Effort: Pulmonary effort is normal.     Breath sounds: Normal breath sounds.  Musculoskeletal:     Right lower leg: No edema.     Left lower leg: No edema.  Neurological:     General: No focal deficit present.     Mental Status: He is alert. He is disoriented.  Psychiatric:        Mood  and Affect: Mood normal.        Behavior: Behavior normal.          Scheduled Medications:   vitamin C   500 mg Oral BID   collagenase    Topical Daily   feeding supplement  237 mL Oral TID BM   levothyroxine   75 mcg Oral Q0600   lidocaine   1 patch Transdermal Q24H   lipase/protease/amylase  24,000 Units Oral TID AC   liver oil-zinc  oxide   Topical BID   megestrol   400 mg Oral Daily   midodrine   10 mg Oral TID WC   multivitamin with minerals  1 tablet Oral Daily   pantoprazole   40 mg Oral BID   pneumococcal 20-valent conjugate vaccine  0.5 mL Intramuscular Tomorrow-1000   predniSONE   5 mg Oral Q breakfast   QUEtiapine   25 mg Oral QHS    Continuous Infusions:   PRN Medications:  acetaminophen , docusate sodium , mouth rinse  Antimicrobials from admission:  Anti-infectives (From admission, onward)    Start     Dose/Rate Route Frequency Ordered Stop   07/17/23 1400  amoxicillin -clavulanate (AUGMENTIN ) 500-125 MG per tablet 1 tablet        1 tablet Oral Every 12 hours 07/17/23 1303 07/20/23 2009   07/16/23 1000   piperacillin -tazobactam (ZOSYN ) IVPB 3.375 g        3.375 g 12.5 mL/hr over 240 Minutes Intravenous Every 12 hours 07/15/23 1538 07/16/23 2359   07/14/23 1600  vancomycin  (VANCOREADY) IVPB 1500 mg/300 mL        1,500 mg 150 mL/hr over 120 Minutes Intravenous Every 24 hours 07/14/23 1349 07/15/23 1935   07/14/23 1500  vancomycin  (VANCOREADY) IVPB 2000 mg/400 mL  Status:  Discontinued        2,000 mg 200 mL/hr over 120 Minutes Intravenous Every 24 hours 07/13/23 1500 07/13/23 1501   07/13/23 1600  vancomycin  (VANCOREADY) IVPB 2000 mg/400 mL  Status:  Discontinued        2,000 mg 200 mL/hr over 120 Minutes Intravenous Every 24 hours 07/13/23 1501 07/14/23 1349   07/12/23 1200  piperacillin -tazobactam (ZOSYN ) IVPB 3.375 g        3.375 g 100 mL/hr over 30 Minutes Intravenous Every 6 hours 07/12/23 1026 07/15/23 1741   07/11/23 1500  vancomycin  (VANCOCIN ) IVPB 1000 mg/200 mL premix  Status:  Discontinued        1,000 mg 200 mL/hr over 60 Minutes Intravenous Every 24 hours 07/10/23 1626 07/11/23 1201   07/11/23 1500  vancomycin  (VANCOREADY) IVPB 1500 mg/300 mL  Status:  Discontinued        1,500 mg 150 mL/hr over 120 Minutes Intravenous Every 24 hours 07/11/23 1201 07/13/23 1500   07/11/23 1000  cefTRIAXone  (ROCEPHIN ) 2 g in sodium chloride  0.9 % 100 mL IVPB  Status:  Discontinued        2 g 200 mL/hr over 30 Minutes Intravenous Every 24 hours 07/10/23 1351 07/12/23 1025   07/11/23 0800  vancomycin  variable dose per unstable renal function (pharmacist dosing)  Status:  Discontinued         Does not apply See admin instructions 07/10/23 1359 07/11/23 1201   07/10/23 1400  metroNIDAZOLE  (FLAGYL ) IVPB 500 mg  Status:  Discontinued        500 mg 100 mL/hr over 60 Minutes Intravenous Every 12 hours 07/10/23 1351 07/12/23 1026   07/10/23 1400  vancomycin  (VANCOCIN ) IVPB 1000 mg/200 mL premix        1,000 mg 200 mL/hr  over 60 Minutes Intravenous  Once 07/10/23 1359 07/10/23 1518   07/10/23 0830   cefTRIAXone  (ROCEPHIN ) 2 g in sodium chloride  0.9 % 100 mL IVPB        2 g 200 mL/hr over 30 Minutes Intravenous Once 07/10/23 0818 07/10/23 0946   07/10/23 0830  vancomycin  (VANCOCIN ) IVPB 1000 mg/200 mL premix        1,000 mg 200 mL/hr over 60 Minutes Intravenous  Once 07/10/23 0818 07/10/23 1026           Data Reviewed:  I have personally reviewed the following...  CBC: Recent Labs  Lab 07/26/23 0341 07/26/23 0956 07/27/23 0440 07/28/23 0431 07/29/23 0325 07/30/23 0438  WBC 23.3*  --  15.0* 9.9 9.7 8.8  HGB 8.5* 10.0* 7.9* 7.0* 8.0* 8.2*  HCT 25.7*  --  23.5* 20.8* 24.9* 25.0*  MCV 94.5  --  94.4 95.4 96.1 96.9  PLT 103*  --  103* 114* 120* 134*   Basic Metabolic Panel: Recent Labs  Lab 07/24/23 0523 07/25/23 0435 07/26/23 0341 07/27/23 0440 07/28/23 0431 07/29/23 0325 07/30/23 0438  NA 137 136 136 138 135 139 137  K 5.3* 4.6 4.9 4.9 4.5 4.6 4.6  CL 108 105 108 109 108 111 109  CO2 21* 20* 15* 20* 22 22 25   GLUCOSE 95 101* 121* 94 98 102* 111*  BUN 88* 82* 85* 81* 66* 58* 47*  CREATININE 1.84* 1.68* 2.03* 1.97* 1.77* 1.73* 1.44*  CALCIUM  8.6* 8.6* 8.3* 8.2* 8.2* 8.4* 8.4*  MG 1.8 2.0  --   --   --  2.1 2.3   GFR: Estimated Creatinine Clearance: 39.8 mL/min (A) (by C-G formula based on SCr of 1.44 mg/dL (H)). Liver Function Tests: No results for input(s): "AST", "ALT", "ALKPHOS", "BILITOT", "PROT", "ALBUMIN" in the last 168 hours. No results for input(s): "LIPASE", "AMYLASE" in the last 168 hours. No results for input(s): "AMMONIA" in the last 168 hours. Coagulation Profile: Recent Labs  Lab 07/23/23 1733  INR 1.1   Cardiac Enzymes: No results for input(s): "CKTOTAL", "CKMB", "CKMBINDEX", "TROPONINI" in the last 168 hours. BNP (last 3 results) No results for input(s): "PROBNP" in the last 8760 hours. HbA1C: No results for input(s): "HGBA1C" in the last 72 hours. CBG: No results for input(s): "GLUCAP" in the last 168 hours. Lipid Profile: No  results for input(s): "CHOL", "HDL", "LDLCALC", "TRIG", "CHOLHDL", "LDLDIRECT" in the last 72 hours. Thyroid  Function Tests: No results for input(s): "TSH", "T4TOTAL", "FREET4", "T3FREE", "THYROIDAB" in the last 72 hours. Anemia Panel: No results for input(s): "VITAMINB12", "FOLATE", "FERRITIN", "TIBC", "IRON", "RETICCTPCT" in the last 72 hours. Most Recent Urinalysis On File:     Component Value Date/Time   COLORURINE YELLOW (A) 07/10/2023 1623   APPEARANCEUR CLEAR (A) 07/10/2023 1623   LABSPEC 1.012 07/10/2023 1623   PHURINE 5.0 07/10/2023 1623   GLUCOSEU >=500 (A) 07/10/2023 1623   HGBUR SMALL (A) 07/10/2023 1623   BILIRUBINUR NEGATIVE 07/10/2023 1623   KETONESUR 5 (A) 07/10/2023 1623   PROTEINUR NEGATIVE 07/10/2023 1623   NITRITE NEGATIVE 07/10/2023 1623   LEUKOCYTESUR NEGATIVE 07/10/2023 1623   Sepsis Labs: @LABRCNTIP (procalcitonin:4,lacticidven:4) Microbiology: Recent Results (from the past 240 hours)  Gastrointestinal Panel by PCR , Stool     Status: None   Collection Time: 07/23/23 10:41 AM   Specimen: Stool  Result Value Ref Range Status   Campylobacter species NOT DETECTED NOT DETECTED Final   Plesimonas shigelloides NOT DETECTED NOT DETECTED Final   Salmonella species NOT DETECTED  NOT DETECTED Final   Yersinia enterocolitica NOT DETECTED NOT DETECTED Final   Vibrio species NOT DETECTED NOT DETECTED Final   Vibrio cholerae NOT DETECTED NOT DETECTED Final   Enteroaggregative E coli (EAEC) NOT DETECTED NOT DETECTED Final   Enteropathogenic E coli (EPEC) NOT DETECTED NOT DETECTED Final   Enterotoxigenic E coli (ETEC) NOT DETECTED NOT DETECTED Final   Shiga like toxin producing E coli (STEC) NOT DETECTED NOT DETECTED Final   Shigella/Enteroinvasive E coli (EIEC) NOT DETECTED NOT DETECTED Final   Cryptosporidium NOT DETECTED NOT DETECTED Final   Cyclospora cayetanensis NOT DETECTED NOT DETECTED Final   Entamoeba histolytica NOT DETECTED NOT DETECTED Final   Giardia  lamblia NOT DETECTED NOT DETECTED Final   Adenovirus F40/41 NOT DETECTED NOT DETECTED Final   Astrovirus NOT DETECTED NOT DETECTED Final   Norovirus GI/GII NOT DETECTED NOT DETECTED Final   Rotavirus A NOT DETECTED NOT DETECTED Final   Sapovirus (I, II, IV, and V) NOT DETECTED NOT DETECTED Final    Comment: Performed at [facility], [address], [place], [place] [address]      Radiology Studies last 3 days: No results found.     Time spent: 35 min     Kenadi Miltner, DO [facility] 07/30/2023, 4:11 PM    Dictation software may have been used to generate the above note. Typos may occur and escape review in typed/dictated notes. Please contact Dr Authur Leghorn directly for clarity if needed.  Staff may message me via secure chat in Epic  but this may not receive an immediate response,  please page me for urgent matters!  If 7PM-7AM, please contact night coverage [email or web address]

## 2023-07-30 NOTE — Progress Notes (Signed)
 Marnee Sink, MD [facility]   [address], [address] [place], [place] [address] Phone: [phone] Fax : [phone]   Subjective: This patient had an upper endoscopy with a clean-based gastric ulcer and duodenal ulcers without any sign of bleeding at the time of the procedure.  The patient hemoglobin has stayed stable overnight.  The patient has not had any further sign of GI bleeding from the rectum or any sign of hematemesis.   Objective: Vital signs in last 24 hours: Vitals:   07/29/23 0858 07/29/23 1606 07/29/23 2043 07/30/23 0510  BP: 111/74 95/65 (!) 103/59 115/71  Pulse: 96 80 70 71  Resp: 16 19 18 19   Temp: 98.1 F (36.7 C) 98 F (36.7 C) 98 F (36.7 C) 97.7 F (36.5 C)  TempSrc:      SpO2: 100% 100% 100% 100%  Weight:      Height:       Weight change:   Intake/Output Summary (Last 24 hours) at 07/30/2023 0804 Last data filed at 07/30/2023 0510 Gross per 24 hour  Intake 0 ml  Output 400 ml  Net -400 ml     Exam: Heart:: Regular rate and rhythm or without murmur or extra heart sounds Lungs: normal and clear to auscultation and percussion Abdomen: soft, nontender, normal bowel sounds   Lab Results: @LABTEST2 @ Micro Results: Recent Results (from the past 240 hours)  Gastrointestinal Panel by PCR , Stool     Status: None   Collection Time: 07/23/23 10:41 AM   Specimen: Stool  Result Value Ref Range Status   Campylobacter species NOT DETECTED NOT DETECTED Final   Plesimonas shigelloides NOT DETECTED NOT DETECTED Final   Salmonella species NOT DETECTED NOT DETECTED Final   Yersinia enterocolitica NOT DETECTED NOT DETECTED Final   Vibrio species NOT DETECTED NOT DETECTED Final   Vibrio cholerae NOT DETECTED NOT DETECTED Final   Enteroaggregative E coli (EAEC) NOT DETECTED NOT DETECTED Final   Enteropathogenic E coli (EPEC) NOT DETECTED NOT DETECTED Final   Enterotoxigenic E coli (ETEC) NOT DETECTED NOT DETECTED Final   Shiga like toxin producing E coli  (STEC) NOT DETECTED NOT DETECTED Final   Shigella/Enteroinvasive E coli (EIEC) NOT DETECTED NOT DETECTED Final   Cryptosporidium NOT DETECTED NOT DETECTED Final   Cyclospora cayetanensis NOT DETECTED NOT DETECTED Final   Entamoeba histolytica NOT DETECTED NOT DETECTED Final   Giardia lamblia NOT DETECTED NOT DETECTED Final   Adenovirus F40/41 NOT DETECTED NOT DETECTED Final   Astrovirus NOT DETECTED NOT DETECTED Final   Norovirus GI/GII NOT DETECTED NOT DETECTED Final   Rotavirus A NOT DETECTED NOT DETECTED Final   Sapovirus (I, II, IV, and V) NOT DETECTED NOT DETECTED Final    Comment: Performed at [facility], [address] [place], [place] [address]   Studies/Results: No results found. Medications: I have reviewed the patient's current medications. Scheduled Meds:  vitamin C   500 mg Oral BID   collagenase    Topical Daily   feeding supplement  237 mL Oral TID BM   levothyroxine   75 mcg Oral Q0600   lidocaine   1 patch Transdermal Q24H   lipase/protease/amylase  24,000 Units Oral TID AC   liver oil-zinc  oxide   Topical BID   megestrol   400 mg Oral Daily   midodrine   10 mg Oral TID WC   multivitamin with minerals  1 tablet Oral Daily   pantoprazole   40 mg Oral BID   pneumococcal 20-valent conjugate vaccine  0.5 mL  Intramuscular Tomorrow-1000   predniSONE   5 mg Oral Q breakfast   QUEtiapine   25 mg Oral QHS   Continuous Infusions: PRN Meds:.acetaminophen , docusate sodium , mouth rinse   Assessment: Principal Problem:   Septic shock (HCC) Active Problems:   NSVT (nonsustained ventricular tachycardia) (HCC)   Acquired hypothyroidism   Acute renal failure superimposed on chronic kidney disease (HCC)   Cellulitis of left foot   Pressure injury of skin   Toxic metabolic encephalopathy   Class 1 obesity   Failure to thrive in adult   Anorexia   Adrenal insufficiency (HCC)   Hyponatremia   Melena   Gastrointestinal hemorrhage associated with anorectal  source    Plan: This patient appears to be stable and has no further sign of any GI bleeding.  I do not believe proceeding with a colonoscopy at this time is needed due to no evidence of lower GI bleeding.  The patient should continue on his PPI.  Nothing further to do from a GI point of view.  I will sign off.  Please call if any further GI concerns or questions.  We would like to thank you for the opportunity to participate in the care of Ethan Vega..    LOS: 20 days   Marnee Sink, MD.FACG 07/30/2023, 8:04 AM Pager [phone] 7am-5pm  Check AMION for 5pm -7am coverage and on weekends

## 2023-07-30 NOTE — Progress Notes (Signed)
 Nutrition Follow-up  DOCUMENTATION CODES:   Obesity unspecified  INTERVENTION:   -Continue 500 mg vitamin C  BID -Continue dysphagia 3 diet -Continue Ensure Enlive po TID, each supplement provides 350 kcal and 20 grams of protein -Continue Magic cup TID with meals, each supplement provides 290 kcal and 9 grams of protein  -Continue MVI with minerals daily  NUTRITION DIAGNOSIS:   Inadequate oral intake related to acute illness as evidenced by per patient/family report.  Ongoing  GOAL:   Patient will meet greater than or equal to 90% of their needs  Progressing   MONITOR:   PO intake, Supplement acceptance, Labs, Weight trends, I & O's, Skin  REASON FOR ASSESSMENT:   Consult Assessment of nutrition requirement/status  ASSESSMENT:   78 y/o male with h/o dementia, CAD, cardiac arrest s/p AICD, HTN, HLD, PAF, chronic wounds and CKD who is admitted with septic shock, AKI and AMS.  5/14- ICD turned off  Reviewed I/O's: -400 ml x 24 hours and -9.8 L since admission 07/16/23  UOP: 400 ml x 24 hours   Pt on dysphagia 3 diet. Noted meal completions 0-50%; pt sleepy today. He is drinking Ensure supplements.   Wt has been stable over the past week.   Palliative care following for goals of care discussions. ICD turned off today.   Medications reviewed and include vitamin C , santyl , synthorid, creon , megace , protamine, protonix , and prednisone .  Labs reviewed.    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  Estimated Nutritional Needs:   Kcal:  2000-2300kcal/day  Protein:  100-115g/day  Fluid:  1.5-1.7L/day    Herschel Lords, RD, LDN, CDCES Registered Dietitian III Certified Diabetes Care and Education Specialist If unable to reach this RD, please use "RD Inpatient" group chat on secure chat between hours of 8am-4 pm daily

## 2023-07-31 DIAGNOSIS — R6521 Severe sepsis with septic shock: Secondary | ICD-10-CM | POA: Diagnosis not present

## 2023-07-31 DIAGNOSIS — A419 Sepsis, unspecified organism: Secondary | ICD-10-CM | POA: Diagnosis not present

## 2023-07-31 DIAGNOSIS — N179 Acute kidney failure, unspecified: Secondary | ICD-10-CM | POA: Diagnosis not present

## 2023-07-31 DIAGNOSIS — L03116 Cellulitis of left lower limb: Secondary | ICD-10-CM | POA: Diagnosis not present

## 2023-07-31 DIAGNOSIS — K625 Hemorrhage of anus and rectum: Secondary | ICD-10-CM | POA: Diagnosis not present

## 2023-07-31 LAB — CBC
HCT: 26.4 % — ABNORMAL LOW (ref 39.0–52.0)
Hemoglobin: 8.4 g/dL — ABNORMAL LOW (ref 13.0–17.0)
MCH: 31.1 pg (ref 26.0–34.0)
MCHC: 31.8 g/dL (ref 30.0–36.0)
MCV: 97.8 fL (ref 80.0–100.0)
Platelets: 149 10*3/uL — ABNORMAL LOW (ref 150–400)
RBC: 2.7 MIL/uL — ABNORMAL LOW (ref 4.22–5.81)
RDW: 18.1 % — ABNORMAL HIGH (ref 11.5–15.5)
WBC: 10 10*3/uL (ref 4.0–10.5)
nRBC: 0 % (ref 0.0–0.2)

## 2023-07-31 LAB — BASIC METABOLIC PANEL WITH GFR
Anion gap: 6 (ref 5–15)
BUN: 42 mg/dL — ABNORMAL HIGH (ref 8–23)
CO2: 25 mmol/L (ref 22–32)
Calcium: 8.8 mg/dL — ABNORMAL LOW (ref 8.9–10.3)
Chloride: 108 mmol/L (ref 98–111)
Creatinine, Ser: 1.58 mg/dL — ABNORMAL HIGH (ref 0.61–1.24)
GFR, Estimated: 44 mL/min — ABNORMAL LOW (ref >60)
Glucose, Bld: 121 mg/dL — ABNORMAL HIGH (ref 70–99)
Potassium: 4.8 mmol/L (ref 3.5–5.1)
Sodium: 139 mmol/L (ref 135–145)

## 2023-07-31 LAB — MAGNESIUM: Magnesium: 2.1 mg/dL (ref 1.7–2.4)

## 2023-07-31 MED ORDER — MIRTAZAPINE 15 MG PO TBDP
7.5000 mg | ORAL_TABLET | Freq: Every day | ORAL | Status: DC
  Administered 2023-07-31: 7.5 mg via ORAL
  Filled 2023-07-31: qty 0.5

## 2023-07-31 NOTE — TOC Progression Note (Addendum)
 Transition of Care (TOC) - Progression Note    Patient Details  Name: Ethan Vega. MRN: [id number] Date of Birth: [birth year]  Transition of Care [facility]) CM/SW Contact  Crayton Docker, RN 07/31/2023, 10:13 AM  Clinical Narrative:     CM follow up call placed to Gena, Admissions, [facility]. Per Gena, SNF is extending bed offer. CM alert to Nitchia P, CMA regarding initiating auth to [facility].    Alert received from Delane Fear, CMA, [name] is Pending AuthID: [id number] . Expected Discharge Plan: Skilled Nursing Facility Barriers to Discharge: No Barriers Identified  Expected Discharge Plan and Services   Discharge Planning Services: CM Consult   Living arrangements for the past 2 months: Single Family Home (4 steps , handrails x 1)    Social Determinants of Health (SDOH) Interventions SDOH Screenings   Food Insecurity: Patient Unable To Answer (07/10/2023)  Housing: Patient Unable To Answer (07/10/2023)  Transportation Needs: Patient Unable To Answer (07/10/2023)  Utilities: Patient Unable To Answer (07/10/2023)  Depression (PHQ2-9): Low Risk  (01/15/2023)  Social Connections: Patient Unable To Answer (07/10/2023)  Tobacco Use: Low Risk  (07/26/2023)    Readmission Risk Interventions     No data to display

## 2023-07-31 NOTE — Progress Notes (Signed)
 PROGRESS NOTE    Ethan Vega.   WJX:[id number] DOB: February 20, 1946  DOA: 07/10/2023 Date of Service: 07/31/23 which is hospital day 21  PCP: Ethan Daughters, MD     Hospital course / significant events:  Ethan Vega is a 78 year old male with a history of essential hypertension, nonischemic cardiomyopathy history of paroxysmal A-fib and ventricular tachycardia, dyslipidemia. He reports diarrhea over the past week. Patient currently lives alone in his home and was previously able to perform all activities of daily living. He is unaware of the open score and cellulitic lesion of his left foot.   Initially presented for elective procedure to have ICD generator change however he was noted to have open sores on his left lower extremity and was demonstrating confusion and encephalopathy - decision to abort procedure and reschedule. He was subsequently sent to the ER.   His workup revealed severe AKI and leukocytosis and vitals with shock physiology. Patient received IV fluid resuscitation but despite 3 L required Levophed  support. Kept inpatient for abx. Also developed GI bleed d/t gastric and duodenal ulcers w/ eliquis . Eliquis  held. EGD 05/07 found clean based gastric and duodenal ulcers, had more bleeding night of 5/9 CTA bleeding scan neg.  EGD 5/10 found healing ulcers. Needing PRBC transfusion 05/12 but Hgb stable since then (if dropping per GI may need to consider colonoscopy)     Consultants:  Gastroenterology  Podiatry  Nephrology Palliative Care   Procedures/Surgeries: 07/23/23 EGD 07/26/23: EGD     ASSESSMENT & PLAN:   Upper GI bleed 2/2 gastric and duodenal ulcer Eliquis  contributed to GI bleed  EGD found clean based gastric and duodenal ulcers. monitor, if significant fresh bleed out from rectum, will order nuclear bleeding scan. cont soft diet cont IV PPI Hold anticoagulation Monitor CBC GI has s/o at this time, reengage as needed   Hypotension cont  midodrine    Acute blood loss anemia s/p 5u pRBC so far monitor Hgb and transfuse to keep Hgb >7 See above re: GIB   Septic shock secondary to left foot infection, complicated by adrenal insufficiency. Left foot ulcer with cellulitis - nearly healed / healing normally. Elevated troponin secondary to septic shock. Adrenal insufficiency. Blood Culture has no growth. CT scan of the foot did not show any bony involvement. He was seen by podiatry, no need for debridement.   Patient was treated with vancomycin , completed dose.  Then on Zosyn  to Augmentin  for additional 3 days to complete 10-day course.   Received steroids    Paroxysmal atrial fibrillation. Nonsustained ventricular tachycardia. Nonischemic cardiomyopathy with chronic systolic congestive heart failure with EF 30-35%. hold anticoagulation Hold beta blocker d/t soft BP, heart rate has been at goal    Failure to thrive. Anorexia, improving Chronic diarrhea. No longer has diarrhea on Creon , continue this. started Protonix , megestrol .   Acute kidney injury chronic kidney disease stage IIIa. Hyponatremia. Followed by nephrology, briefly needing dialysis.  Renal function had improved.     Monitor BMP   Hyperkalemia Lokelma  PRN Monitor BMP   Right foot pain US  DVT studies neg for DVT in BLE   Acute metabolic encephalopathy. Chronic dementia. Condition appears to be stable Delirium precautions.   Debility. PT/OT recommended SNF rehab, TOC following      Pressure ulcer POA Pressure Injury 07/11/23 Buttocks Left Stage 2 -  Partial thickness loss of dermis presenting as a shallow open injury with a red, pink wound bed without slough. (Active)  07/11/23 1049  Location: Buttocks  Location Orientation: Left  Staging: Stage 2 -  Partial thickness loss of dermis presenting as a shallow open injury with a red, pink wound bed without slough.  Wound Description (Comments):   Present on Admission: Yes     Pressure Injury  07/11/23 Buttocks Medial Stage 2 -  Partial thickness loss of dermis presenting as a shallow open injury with a red, pink wound bed without slough. (Active)  07/11/23 1100  Location: Buttocks  Location Orientation: Medial  Staging: Stage 2 -  Partial thickness loss of dermis presenting as a shallow open injury with a red, pink wound bed without slough.  Wound Description (Comments):   Present on Admission: Yes   wound care continue       Class 1 obesity based on BMI: Body mass index is 33.7 kg/m.Ethan Vega Significantly low or high BMI is associated with higher medical risk.  Underweight - under 18  overweight - 25 to 29 obese - 30 or more Class 1 obesity: BMI of 30.0 to 34 Class 2 obesity: BMI of 35.0 to 39 Class 3 obesity: BMI of 40.0 to 49 Super Morbid Obesity: BMI 50-59 Super-super Morbid Obesity: BMI 60+ Healthy nutrition and physical activity advised as adjunct to other disease management and risk reduction treatments    DVT prophylaxis: holding given GI bleed IV fluids: no continuous IV fluids  Nutrition: regular Central lines / other devices: none  Code Status: DNR ACP documentation reviewed:  none on file in [facility]  TOC needs: placement  Medical barriers to dispo: Expected medical readiness for discharge tomorrow/Friday if Hgb remains stable / no bleeding.              Subjective / Brief ROS:  Patient reports no concerns at this time  Denies CP/SOB.  Pain controlled.  Denies new weakness.  Tolerating diet.   Family Communication: called yesterday spoke w/ daughter Carfagno   Objective Findings:  Vitals:   07/30/23 1655 07/30/23 2031 07/31/23 0500 07/31/23 0832  BP: 108/71 107/82 112/75 99/62  Pulse: 74 77 78 79  Resp: 16  18 18   Temp: 98.5 F (36.9 C) 97.9 F (36.6 C) 98.3 F (36.8 C) 98 F (36.7 C)  TempSrc:   Oral Oral  SpO2: 100% 100% 99% 99%  Weight:      Height:        Intake/Output Summary (Last 24 hours) at 07/31/2023 1337 Last data filed  at 07/31/2023 7829 Gross per 24 hour  Intake --  Output 800 ml  Net -800 ml   Filed Weights   07/27/23 0441 07/28/23 0500 07/29/23 0500  Weight: 87 kg 89.5 kg 86.3 kg    Examination:  Physical Exam Constitutional:      General: He is not in acute distress.    Appearance: He is not ill-appearing.  Cardiovascular:     Rate and Rhythm: Normal rate and regular rhythm.  Pulmonary:     Effort: Pulmonary effort is normal.     Breath sounds: Normal breath sounds.  Musculoskeletal:     Right lower leg: No edema.     Left lower leg: No edema.  Neurological:     General: No focal deficit present.     Mental Status: He is alert. He is disoriented.  Psychiatric:        Mood and Affect: Mood normal.        Behavior: Behavior normal.          Scheduled Medications:   vitamin C   500 mg  Oral BID   collagenase    Topical Daily   feeding supplement  237 mL Oral TID BM   levothyroxine   75 mcg Oral Q0600   lidocaine   1 patch Transdermal Q24H   lipase/protease/amylase  24,000 Units Oral TID AC   liver oil-zinc  oxide   Topical BID   midodrine   10 mg Oral TID WC   mirtazapine  7.5 mg Oral QHS   multivitamin with minerals  1 tablet Oral Daily   pantoprazole   40 mg Oral BID   pneumococcal 20-valent conjugate vaccine  0.5 mL Intramuscular Tomorrow-1000   predniSONE   5 mg Oral Q breakfast   QUEtiapine   25 mg Oral QHS    Continuous Infusions:   PRN Medications:  acetaminophen , docusate sodium , mouth rinse  Antimicrobials from admission:  Anti-infectives (From admission, onward)    Start     Dose/Rate Route Frequency Ordered Stop   07/17/23 1400  amoxicillin -clavulanate (AUGMENTIN ) 500-125 MG per tablet 1 tablet        1 tablet Oral Every 12 hours 07/17/23 1303 07/20/23 2009   07/16/23 1000  piperacillin -tazobactam (ZOSYN ) IVPB 3.375 g        3.375 g 12.5 mL/hr over 240 Minutes Intravenous Every 12 hours 07/15/23 1538 07/16/23 2359   07/14/23 1600  vancomycin  (VANCOREADY) IVPB  1500 mg/300 mL        1,500 mg 150 mL/hr over 120 Minutes Intravenous Every 24 hours 07/14/23 1349 07/15/23 1935   07/14/23 1500  vancomycin  (VANCOREADY) IVPB 2000 mg/400 mL  Status:  Discontinued        2,000 mg 200 mL/hr over 120 Minutes Intravenous Every 24 hours 07/13/23 1500 07/13/23 1501   07/13/23 1600  vancomycin  (VANCOREADY) IVPB 2000 mg/400 mL  Status:  Discontinued        2,000 mg 200 mL/hr over 120 Minutes Intravenous Every 24 hours 07/13/23 1501 07/14/23 1349   07/12/23 1200  piperacillin -tazobactam (ZOSYN ) IVPB 3.375 g        3.375 g 100 mL/hr over 30 Minutes Intravenous Every 6 hours 07/12/23 1026 07/15/23 1741   07/11/23 1500  vancomycin  (VANCOCIN ) IVPB 1000 mg/200 mL premix  Status:  Discontinued        1,000 mg 200 mL/hr over 60 Minutes Intravenous Every 24 hours 07/10/23 1626 07/11/23 1201   07/11/23 1500  vancomycin  (VANCOREADY) IVPB 1500 mg/300 mL  Status:  Discontinued        1,500 mg 150 mL/hr over 120 Minutes Intravenous Every 24 hours 07/11/23 1201 07/13/23 1500   07/11/23 1000  cefTRIAXone  (ROCEPHIN ) 2 g in sodium chloride  0.9 % 100 mL IVPB  Status:  Discontinued        2 g 200 mL/hr over 30 Minutes Intravenous Every 24 hours 07/10/23 1351 07/12/23 1025   07/11/23 0800  vancomycin  variable dose per unstable renal function (pharmacist dosing)  Status:  Discontinued         Does not apply See admin instructions 07/10/23 1359 07/11/23 1201   07/10/23 1400  metroNIDAZOLE  (FLAGYL ) IVPB 500 mg  Status:  Discontinued        500 mg 100 mL/hr over 60 Minutes Intravenous Every 12 hours 07/10/23 1351 07/12/23 1026   07/10/23 1400  vancomycin  (VANCOCIN ) IVPB 1000 mg/200 mL premix        1,000 mg 200 mL/hr over 60 Minutes Intravenous  Once 07/10/23 1359 07/10/23 1518   07/10/23 0830  cefTRIAXone  (ROCEPHIN ) 2 g in sodium chloride  0.9 % 100 mL IVPB  2 g 200 mL/hr over 30 Minutes Intravenous Once 07/10/23 0818 07/10/23 0946   07/10/23 0830  vancomycin  (VANCOCIN ) IVPB  1000 mg/200 mL premix        1,000 mg 200 mL/hr over 60 Minutes Intravenous  Once 07/10/23 0818 07/10/23 1026           Data Reviewed:  I have personally reviewed the following...  CBC: Recent Labs  Lab 07/27/23 0440 07/28/23 0431 07/29/23 0325 07/30/23 0438 07/31/23 0603  WBC 15.0* 9.9 9.7 8.8 10.0  HGB 7.9* 7.0* 8.0* 8.2* 8.4*  HCT 23.5* 20.8* 24.9* 25.0* 26.4*  MCV 94.4 95.4 96.1 96.9 97.8  PLT 103* 114* 120* 134* 149*   Basic Metabolic Panel: Recent Labs  Lab 07/25/23 0435 07/26/23 0341 07/27/23 0440 07/28/23 0431 07/29/23 0325 07/30/23 0438 07/31/23 0603  NA 136   < > 138 135 139 137 139  K 4.6   < > 4.9 4.5 4.6 4.6 4.8  CL 105   < > 109 108 111 109 108  CO2 20*   < > 20* 22 22 25 25   GLUCOSE 101*   < > 94 98 102* 111* 121*  BUN 82*   < > 81* 66* 58* 47* 42*  CREATININE 1.68*   < > 1.97* 1.77* 1.73* 1.44* 1.58*  CALCIUM  8.6*   < > 8.2* 8.2* 8.4* 8.4* 8.8*  MG 2.0  --   --   --  2.1 2.3 2.1   < > = values in this interval not displayed.   GFR: Estimated Creatinine Clearance: 36.2 mL/min (A) (by C-G formula based on SCr of 1.58 mg/dL (H)). Liver Function Tests: No results for input(s): "AST", "ALT", "ALKPHOS", "BILITOT", "PROT", "ALBUMIN" in the last 168 hours. No results for input(s): "LIPASE", "AMYLASE" in the last 168 hours. No results for input(s): "AMMONIA" in the last 168 hours. Coagulation Profile: No results for input(s): "INR", "PROTIME" in the last 168 hours.  Cardiac Enzymes: No results for input(s): "CKTOTAL", "CKMB", "CKMBINDEX", "TROPONINI" in the last 168 hours. BNP (last 3 results) No results for input(s): "PROBNP" in the last 8760 hours. HbA1C: No results for input(s): "HGBA1C" in the last 72 hours. CBG: No results for input(s): "GLUCAP" in the last 168 hours. Lipid Profile: No results for input(s): "CHOL", "HDL", "LDLCALC", "TRIG", "CHOLHDL", "LDLDIRECT" in the last 72 hours. Thyroid  Function Tests: No results for input(s):  "TSH", "T4TOTAL", "FREET4", "T3FREE", "THYROIDAB" in the last 72 hours. Anemia Panel: No results for input(s): "VITAMINB12", "FOLATE", "FERRITIN", "TIBC", "IRON", "RETICCTPCT" in the last 72 hours. Most Recent Urinalysis On File:     Component Value Date/Time   COLORURINE YELLOW (A) 07/10/2023 1623   APPEARANCEUR CLEAR (A) 07/10/2023 1623   LABSPEC 1.012 07/10/2023 1623   PHURINE 5.0 07/10/2023 1623   GLUCOSEU >=500 (A) 07/10/2023 1623   HGBUR SMALL (A) 07/10/2023 1623   BILIRUBINUR NEGATIVE 07/10/2023 1623   KETONESUR 5 (A) 07/10/2023 1623   PROTEINUR NEGATIVE 07/10/2023 1623   NITRITE NEGATIVE 07/10/2023 1623   LEUKOCYTESUR NEGATIVE 07/10/2023 1623   Sepsis Labs: @LABRCNTIP (procalcitonin:4,lacticidven:4) Microbiology: Recent Results (from the past 240 hours)  Gastrointestinal Panel by PCR , Stool     Status: None   Collection Time: 07/23/23 10:41 AM   Specimen: Stool  Result Value Ref Range Status   Campylobacter species NOT DETECTED NOT DETECTED Final   Plesimonas shigelloides NOT DETECTED NOT DETECTED Final   Salmonella species NOT DETECTED NOT DETECTED Final   Yersinia enterocolitica NOT DETECTED NOT DETECTED Final  Vibrio species NOT DETECTED NOT DETECTED Final   Vibrio cholerae NOT DETECTED NOT DETECTED Final   Enteroaggregative E coli (EAEC) NOT DETECTED NOT DETECTED Final   Enteropathogenic E coli (EPEC) NOT DETECTED NOT DETECTED Final   Enterotoxigenic E coli (ETEC) NOT DETECTED NOT DETECTED Final   Shiga like toxin producing E coli (STEC) NOT DETECTED NOT DETECTED Final   Shigella/Enteroinvasive E coli (EIEC) NOT DETECTED NOT DETECTED Final   Cryptosporidium NOT DETECTED NOT DETECTED Final   Cyclospora cayetanensis NOT DETECTED NOT DETECTED Final   Entamoeba histolytica NOT DETECTED NOT DETECTED Final   Giardia lamblia NOT DETECTED NOT DETECTED Final   Adenovirus F40/41 NOT DETECTED NOT DETECTED Final   Astrovirus NOT DETECTED NOT DETECTED Final   Norovirus  GI/GII NOT DETECTED NOT DETECTED Final   Rotavirus A NOT DETECTED NOT DETECTED Final   Sapovirus (I, II, IV, and V) NOT DETECTED NOT DETECTED Final    Comment: Performed at [facility], [address], [place], [place] [address]      Radiology Studies last 3 days: No results found.     Time spent: 35 min     Kourtland Coopman, DO [facility] 07/31/2023, 1:37 PM    Dictation software may have been used to generate the above note. Typos may occur and escape review in typed/dictated notes. Please contact Dr Authur Leghorn directly for clarity if needed.  Staff may message me via secure chat in Epic  but this may not receive an immediate response,  please page me for urgent matters!  If 7PM-7AM, please contact night coverage [email or web address]

## 2023-07-31 NOTE — Progress Notes (Signed)
 Palliative Care Progress Note, Assessment & Plan   Patient Name: Ethan Vega.       Date: 07/31/2023 DOB: [birth year]  Age: 78 y.o. MRN#: [id number] Attending Physician: Melodi Sprung, DO Primary Care Physician: Shari Daughters, MD Admit Date: 07/10/2023  Subjective: Patient is out of bed and sitting in recliner.  He is asleep but easily awakens to my presence.  He acknowledges my presence and is able to make his wishes known.  No family or friends present at bedside during my visit.  HPI: 78 y.o. male  with past medical history of  essential hypertension, nonischemic cardiomyopathy history of paroxysmal A-fib and ventricular tachycardia, dyslipidemia. He presented to hospital 07/10/23 to have ICD generator changed. Staff found him to have open wounds to LLE, confusion and hypotension. He was subsequently sent to ED for evaluation. ED workup found patient to have AKI, leukocytosis and hypotension with creatinine 5.5 (baseline 1.7), Na+ 134, K+ 5.4, BUN 151, BNP 672.1, Trop 164, Lactic 2.2 and WBC 16.1.   He received IV fluid resuscitation but required pressor support. Daughter at bedside reported that patient lives alone, performing his ADLs independently. She adds that he complained of diarrhea and poor PO intake 2 weeks prior. Pt was unaware of the open sore to his LLE but did confirm chronic lower extremity pitting edema. He was admitted to ICU for circulatory shock with AKI.    Palliative care was consulted to discuss goals of care.  Summary of counseling/coordination of care: Extensive chart review completed prior to meeting patient including labs, vital signs, imaging, progress notes, orders, and available advanced directive documents from current and previous encounters.   After reviewing  the patient's chart and assessing the patient at bedside, I spoke with patient in regards to symptom management and goals of care.   Symptoms assessed.  Patient endorses he is just fearful of sliding out of the chair.  Patient offered to be repositioned.  Pillows given.  Patient shares he would like to get back in bed.  I shared that he is encouraged to stay in the chair as long as he can tolerate.  Then, the medical team will assist him in moving back to bed.  No adjustment to plan of care or MAR at this time.  Symptom burden is low.  Goals are clear.  Plan is set for patient to discharge to SNF with rehab.  Patient is next of kin decision maker is his daughter Bolsinger who has a MOST form should she choose to complete it.  Discussed with attending that PMT will step back from daily visits.  Please reengage with PMT if goals change, at patient/family's request, or if patient's health deteriorates during hospitalization.  Physical Exam Vitals reviewed.  Constitutional:      General: He is not in acute distress. HENT:     Head: Normocephalic.     Mouth/Throat:     Mouth: Mucous membranes are moist.  Eyes:     Pupils: Pupils are equal, round, and reactive to light.  Cardiovascular:     Pulses: Normal pulses.  Pulmonary:     Effort: Pulmonary effort is normal.  Abdominal:     Palpations: Abdomen is  soft.  Neurological:     Mental Status: He is alert.     Comments: Oriented to self and place  Psychiatric:        Mood and Affect: Mood normal.        Behavior: Behavior normal.        Thought Content: Thought content normal.        Judgment: Judgment normal.             Total Time 25 minutes   Time spent includes: Detailed review of medical records (labs, imaging, vital signs), medically appropriate exam (mental status, respiratory, cardiac, skin), discussed with treatment team, counseling and educating patient, family and staff, documenting clinical information, medication management  and coordination of care.  Judeen Nose L. Rebbeca Campi, DNP, FNP-BC Palliative Medicine Team

## 2023-08-01 DIAGNOSIS — Z7189 Other specified counseling: Secondary | ICD-10-CM | POA: Diagnosis not present

## 2023-08-01 DIAGNOSIS — K260 Acute duodenal ulcer with hemorrhage: Secondary | ICD-10-CM | POA: Diagnosis not present

## 2023-08-01 DIAGNOSIS — D509 Iron deficiency anemia, unspecified: Secondary | ICD-10-CM | POA: Diagnosis present

## 2023-08-01 DIAGNOSIS — F0390 Unspecified dementia without behavioral disturbance: Secondary | ICD-10-CM | POA: Diagnosis present

## 2023-08-01 DIAGNOSIS — I82B12 Acute embolism and thrombosis of left subclavian vein: Secondary | ICD-10-CM | POA: Diagnosis not present

## 2023-08-01 DIAGNOSIS — L89302 Pressure ulcer of unspecified buttock, stage 2: Secondary | ICD-10-CM | POA: Diagnosis not present

## 2023-08-01 DIAGNOSIS — L89311 Pressure ulcer of right buttock, stage 1: Secondary | ICD-10-CM | POA: Diagnosis present

## 2023-08-01 DIAGNOSIS — R778 Other specified abnormalities of plasma proteins: Secondary | ICD-10-CM | POA: Diagnosis not present

## 2023-08-01 DIAGNOSIS — R7989 Other specified abnormal findings of blood chemistry: Secondary | ICD-10-CM | POA: Diagnosis not present

## 2023-08-01 DIAGNOSIS — E785 Hyperlipidemia, unspecified: Secondary | ICD-10-CM | POA: Diagnosis not present

## 2023-08-01 DIAGNOSIS — Z8249 Family history of ischemic heart disease and other diseases of the circulatory system: Secondary | ICD-10-CM | POA: Diagnosis not present

## 2023-08-01 DIAGNOSIS — R0689 Other abnormalities of breathing: Secondary | ICD-10-CM | POA: Diagnosis not present

## 2023-08-01 DIAGNOSIS — R627 Adult failure to thrive: Secondary | ICD-10-CM | POA: Diagnosis present

## 2023-08-01 DIAGNOSIS — I82A12 Acute embolism and thrombosis of left axillary vein: Secondary | ICD-10-CM | POA: Diagnosis not present

## 2023-08-01 DIAGNOSIS — R0989 Other specified symptoms and signs involving the circulatory and respiratory systems: Secondary | ICD-10-CM | POA: Diagnosis not present

## 2023-08-01 DIAGNOSIS — J45909 Unspecified asthma, uncomplicated: Secondary | ICD-10-CM | POA: Diagnosis present

## 2023-08-01 DIAGNOSIS — L24A2 Irritant contact dermatitis due to fecal, urinary or dual incontinence: Secondary | ICD-10-CM | POA: Diagnosis not present

## 2023-08-01 DIAGNOSIS — Z743 Need for continuous supervision: Secondary | ICD-10-CM | POA: Diagnosis not present

## 2023-08-01 DIAGNOSIS — I2489 Other forms of acute ischemic heart disease: Secondary | ICD-10-CM | POA: Diagnosis present

## 2023-08-01 DIAGNOSIS — Z6831 Body mass index (BMI) 31.0-31.9, adult: Secondary | ICD-10-CM | POA: Diagnosis not present

## 2023-08-01 DIAGNOSIS — A419 Sepsis, unspecified organism: Secondary | ICD-10-CM | POA: Diagnosis present

## 2023-08-01 DIAGNOSIS — R6521 Severe sepsis with septic shock: Secondary | ICD-10-CM | POA: Diagnosis not present

## 2023-08-01 DIAGNOSIS — R000 Tachycardia, unspecified: Secondary | ICD-10-CM | POA: Diagnosis not present

## 2023-08-01 DIAGNOSIS — I499 Cardiac arrhythmia, unspecified: Secondary | ICD-10-CM | POA: Diagnosis not present

## 2023-08-01 DIAGNOSIS — Z1152 Encounter for screening for COVID-19: Secondary | ICD-10-CM | POA: Diagnosis not present

## 2023-08-01 DIAGNOSIS — J189 Pneumonia, unspecified organism: Secondary | ICD-10-CM | POA: Diagnosis not present

## 2023-08-01 DIAGNOSIS — Z950 Presence of cardiac pacemaker: Secondary | ICD-10-CM | POA: Diagnosis not present

## 2023-08-01 DIAGNOSIS — R1311 Dysphagia, oral phase: Secondary | ICD-10-CM | POA: Diagnosis not present

## 2023-08-01 DIAGNOSIS — J690 Pneumonitis due to inhalation of food and vomit: Secondary | ICD-10-CM | POA: Diagnosis present

## 2023-08-01 DIAGNOSIS — I428 Other cardiomyopathies: Secondary | ICD-10-CM | POA: Diagnosis present

## 2023-08-01 DIAGNOSIS — K259 Gastric ulcer, unspecified as acute or chronic, without hemorrhage or perforation: Secondary | ICD-10-CM | POA: Diagnosis not present

## 2023-08-01 DIAGNOSIS — I10 Essential (primary) hypertension: Secondary | ICD-10-CM | POA: Diagnosis present

## 2023-08-01 DIAGNOSIS — R1310 Dysphagia, unspecified: Secondary | ICD-10-CM | POA: Diagnosis present

## 2023-08-01 DIAGNOSIS — Z66 Do not resuscitate: Secondary | ICD-10-CM | POA: Diagnosis present

## 2023-08-01 DIAGNOSIS — R0602 Shortness of breath: Secondary | ICD-10-CM | POA: Diagnosis present

## 2023-08-01 DIAGNOSIS — R404 Transient alteration of awareness: Secondary | ICD-10-CM | POA: Diagnosis not present

## 2023-08-01 DIAGNOSIS — I517 Cardiomegaly: Secondary | ICD-10-CM | POA: Diagnosis not present

## 2023-08-01 DIAGNOSIS — E569 Vitamin deficiency, unspecified: Secondary | ICD-10-CM | POA: Diagnosis not present

## 2023-08-01 DIAGNOSIS — I4729 Other ventricular tachycardia: Secondary | ICD-10-CM | POA: Diagnosis not present

## 2023-08-01 DIAGNOSIS — K219 Gastro-esophageal reflux disease without esophagitis: Secondary | ICD-10-CM | POA: Diagnosis present

## 2023-08-01 DIAGNOSIS — R6520 Severe sepsis without septic shock: Secondary | ICD-10-CM | POA: Diagnosis present

## 2023-08-01 DIAGNOSIS — I959 Hypotension, unspecified: Secondary | ICD-10-CM | POA: Diagnosis not present

## 2023-08-01 DIAGNOSIS — I5022 Chronic systolic (congestive) heart failure: Secondary | ICD-10-CM | POA: Diagnosis not present

## 2023-08-01 DIAGNOSIS — E039 Hypothyroidism, unspecified: Secondary | ICD-10-CM | POA: Diagnosis not present

## 2023-08-01 DIAGNOSIS — Z515 Encounter for palliative care: Secondary | ICD-10-CM | POA: Diagnosis not present

## 2023-08-01 DIAGNOSIS — M6259 Muscle wasting and atrophy, not elsewhere classified, multiple sites: Secondary | ICD-10-CM | POA: Diagnosis not present

## 2023-08-01 DIAGNOSIS — I771 Stricture of artery: Secondary | ICD-10-CM | POA: Diagnosis not present

## 2023-08-01 DIAGNOSIS — K5900 Constipation, unspecified: Secondary | ICD-10-CM | POA: Diagnosis not present

## 2023-08-01 DIAGNOSIS — L03116 Cellulitis of left lower limb: Secondary | ICD-10-CM | POA: Diagnosis not present

## 2023-08-01 DIAGNOSIS — I2510 Atherosclerotic heart disease of native coronary artery without angina pectoris: Secondary | ICD-10-CM | POA: Diagnosis present

## 2023-08-01 DIAGNOSIS — I82612 Acute embolism and thrombosis of superficial veins of left upper extremity: Secondary | ICD-10-CM | POA: Diagnosis not present

## 2023-08-01 DIAGNOSIS — R52 Pain, unspecified: Secondary | ICD-10-CM | POA: Diagnosis not present

## 2023-08-01 DIAGNOSIS — J302 Other seasonal allergic rhinitis: Secondary | ICD-10-CM | POA: Diagnosis not present

## 2023-08-01 DIAGNOSIS — E872 Acidosis, unspecified: Secondary | ICD-10-CM | POA: Diagnosis present

## 2023-08-01 DIAGNOSIS — Z85820 Personal history of malignant melanoma of skin: Secondary | ICD-10-CM | POA: Diagnosis not present

## 2023-08-01 DIAGNOSIS — G9341 Metabolic encephalopathy: Secondary | ICD-10-CM | POA: Diagnosis present

## 2023-08-01 DIAGNOSIS — R600 Localized edema: Secondary | ICD-10-CM | POA: Diagnosis not present

## 2023-08-01 DIAGNOSIS — I480 Paroxysmal atrial fibrillation: Secondary | ICD-10-CM | POA: Diagnosis present

## 2023-08-01 DIAGNOSIS — N179 Acute kidney failure, unspecified: Secondary | ICD-10-CM | POA: Diagnosis not present

## 2023-08-01 DIAGNOSIS — E54 Ascorbic acid deficiency: Secondary | ICD-10-CM | POA: Diagnosis not present

## 2023-08-01 DIAGNOSIS — R059 Cough, unspecified: Secondary | ICD-10-CM | POA: Diagnosis not present

## 2023-08-01 LAB — BASIC METABOLIC PANEL WITH GFR
Anion gap: 7 (ref 5–15)
BUN: 43 mg/dL — ABNORMAL HIGH (ref 8–23)
CO2: 24 mmol/L (ref 22–32)
Calcium: 8.5 mg/dL — ABNORMAL LOW (ref 8.9–10.3)
Chloride: 109 mmol/L (ref 98–111)
Creatinine, Ser: 1.38 mg/dL — ABNORMAL HIGH (ref 0.61–1.24)
GFR, Estimated: 52 mL/min — ABNORMAL LOW (ref >60)
Glucose, Bld: 103 mg/dL — ABNORMAL HIGH (ref 70–99)
Potassium: 4.4 mmol/L (ref 3.5–5.1)
Sodium: 140 mmol/L (ref 135–145)

## 2023-08-01 LAB — CBC
HCT: 27.1 % — ABNORMAL LOW (ref 39.0–52.0)
Hemoglobin: 8.8 g/dL — ABNORMAL LOW (ref 13.0–17.0)
MCH: 31.4 pg (ref 26.0–34.0)
MCHC: 32.5 g/dL (ref 30.0–36.0)
MCV: 96.8 fL (ref 80.0–100.0)
Platelets: 162 10*3/uL (ref 150–400)
RBC: 2.8 MIL/uL — ABNORMAL LOW (ref 4.22–5.81)
RDW: 17.7 % — ABNORMAL HIGH (ref 11.5–15.5)
WBC: 9.7 10*3/uL (ref 4.0–10.5)
nRBC: 0 % (ref 0.0–0.2)

## 2023-08-01 LAB — MAGNESIUM: Magnesium: 2 mg/dL (ref 1.7–2.4)

## 2023-08-01 MED ORDER — MIRTAZAPINE 15 MG PO TBDP: 7.5000 mg | ORAL_TABLET | Freq: Every day | ORAL | Status: DC

## 2023-08-01 MED ORDER — MIDODRINE HCL 10 MG PO TABS
10.0000 mg | ORAL_TABLET | Freq: Three times a day (TID) | ORAL | Status: DC
Start: 2023-08-01 — End: 2023-08-18

## 2023-08-01 MED ORDER — ZINC OXIDE 40 % EX OINT: TOPICAL_OINTMENT | Freq: Two times a day (BID) | CUTANEOUS | Status: DC

## 2023-08-01 MED ORDER — COLLAGENASE 250 UNIT/GM EX OINT: TOPICAL_OINTMENT | Freq: Every day | CUTANEOUS | Status: DC

## 2023-08-01 MED ORDER — FUROSEMIDE 40 MG PO TABS: ORAL_TABLET | ORAL | Status: DC

## 2023-08-01 MED ORDER — PANTOPRAZOLE SODIUM 40 MG PO TBEC: 40.0000 mg | DELAYED_RELEASE_TABLET | Freq: Two times a day (BID) | ORAL | Status: DC

## 2023-08-01 MED ORDER — QUETIAPINE FUMARATE 25 MG PO TABS: 25.0000 mg | ORAL_TABLET | Freq: Every day | ORAL | Status: DC

## 2023-08-01 MED ORDER — PANCRELIPASE (LIP-PROT-AMYL) 24000-76000 UNITS PO CPEP: 24000.0000 [IU] | ORAL_CAPSULE | Freq: Three times a day (TID) | ORAL | Status: DC

## 2023-08-01 MED ORDER — ENSURE ENLIVE PO LIQD: 237.0000 mL | Freq: Three times a day (TID) | ORAL | Status: DC

## 2023-08-01 MED ORDER — DOCUSATE SODIUM 100 MG PO CAPS: 100.0000 mg | ORAL_CAPSULE | Freq: Two times a day (BID) | ORAL | Status: DC | PRN

## 2023-08-01 MED ORDER — ASCORBIC ACID 500 MG PO TABS: 500.0000 mg | ORAL_TABLET | Freq: Two times a day (BID) | ORAL | Status: DC

## 2023-08-01 NOTE — Discharge Summary (Signed)
 Physician Discharge Summary   Patient: Ethan Vega. MRN: [id number]  DOB: 04-Jul-1945   Admit:     Date of Admission: 07/10/2023 Admitted from: home   Discharge: Date of discharge: 08/01/23 Disposition: SNF rehab Condition at discharge: good  CODE STATUS: DNR     Discharge Physician: Melodi Sprung, DO [facility]     PCP: Shari Daughters, MD  Recommendations for Outpatient Follow-up:  Follow up with PCP Shari Daughters, MD in 1-2 weeks    Discharge Instructions     Diet general   Complete by: As directed    Soft pre-cut foods   Discharge wound care:   Complete by: As directed    Apply Medihoney to left anterior foot and left leg Q day, then cover with foam dressing.  Change foam dressing Q 3 days or PRN soiling   Increase activity slowly   Complete by: As directed          Discharge Diagnoses: Principal Problem:   Septic shock (HCC) Active Problems:   NSVT (nonsustained ventricular tachycardia) (HCC)   Acquired hypothyroidism   Acute renal failure superimposed on chronic kidney disease (HCC)   Cellulitis of left foot   Pressure injury of skin   Toxic metabolic encephalopathy   Class 1 obesity   Failure to thrive in adult   Anorexia   Adrenal insufficiency (HCC)   Hyponatremia   Melena   Gastrointestinal hemorrhage associated with anorectal source   PUD (peptic ulcer disease)       Hospital course / significant events:  Ethan Vega is a 78 year old male with a history of essential hypertension, nonischemic cardiomyopathy history of paroxysmal A-fib and ventricular tachycardia, dyslipidemia. He reports diarrhea over the past week. Patient currently lives alone in his home and was previously able to perform all activities of daily living. He is unaware of the open score and cellulitic lesion of his left foot.   Initially presented for elective procedure to have ICD generator change however he was noted to have open sores  on his left lower extremity and was demonstrating confusion and encephalopathy - decision to abort procedure and reschedule. He was subsequently sent to the ER.   His workup revealed severe AKI and leukocytosis and vitals with shock physiology. Patient received IV fluid resuscitation but despite 3 L required Levophed  support. Kept inpatient for abx. Also developed GI bleed d/t gastric and duodenal ulcers w/ eliquis . Eliquis  held. EGD 05/07 found clean based gastric and duodenal ulcers, had more bleeding night of 5/9 CTA bleeding scan neg.  EGD 5/10 found healing ulcers. Needing PRBC transfusion 05/12 but Hgb stable since then (if dropping per GI may need to consider colonoscopy) - Hgb remains stable into 05/16 and placement confirmed     Consultants:  Gastroenterology  Podiatry  Nephrology Palliative Care   Procedures/Surgeries: 07/23/23 EGD 07/26/23: EGD     ASSESSMENT & PLAN:   Upper GI bleed 2/2 gastric and duodenal ulcer Eliquis  contributed to GI bleed  EGD found clean based gastric and duodenal ulcers. cont soft diet advance as tolerates cont PPI Hold anticoagulation Monitor CBC every few days / per SNF attending  GI has s/o at this time, reengage as needed   Hypotension cont midodrine    Acute blood loss anemia s/p 5u pRBC so far monitor Hgb and transfuse to keep Hgb >7 See above re: GIB   Septic shock secondary to left foot infection, complicated by adrenal insufficiency. Left foot ulcer with cellulitis -  nearly healed / healing normally. Elevated troponin secondary to septic shock. Adrenal insufficiency. Blood Culture has no growth. CT scan of the foot did not show any bony involvement. He was seen by podiatry, no need for debridement.   Patient was treated with vancomycin , completed dose.  Then on Zosyn  to Augmentin  for additional 3 days to complete 10-day course.   tapering steroids    Paroxysmal atrial fibrillation. Nonsustained ventricular  tachycardia. Nonischemic cardiomyopathy with chronic systolic congestive heart failure with EF 30-35%. hold anticoagulation Hold beta blocker d/t soft BP, heart rate has been at goal    Failure to thrive. Anorexia, improving Chronic diarrhea. No longer has diarrhea on Creon , continue this. started Protonix , megestrol .   Acute kidney injury chronic kidney disease stage IIIa. Hyponatremia. Followed by nephrology, briefly needing dialysis.  Renal function had improved.     Monitor BMP   Hyperkalemia Lokelma  PRN Monitor BMP   Right foot pain US  DVT studies neg for DVT in BLE   Acute metabolic encephalopathy. Chronic dementia. Condition appears to be stable Delirium precautions.   Debility. PT/OT recommended SNF rehab, TOC following      Pressure ulcer POA Pressure Injury 07/11/23 Buttocks Left Stage 2 -  Partial thickness loss of dermis presenting as a shallow open injury with a red, pink wound bed without slough. (Active)  07/11/23 1049  Location: Buttocks  Location Orientation: Left  Staging: Stage 2 -  Partial thickness loss of dermis presenting as a shallow open injury with a red, pink wound bed without slough.  Wound Description (Comments):   Present on Admission: Yes     Pressure Injury 07/11/23 Buttocks Medial Stage 2 -  Partial thickness loss of dermis presenting as a shallow open injury with a red, pink wound bed without slough. (Active)  07/11/23 1100  Location: Buttocks  Location Orientation: Medial  Staging: Stage 2 -  Partial thickness loss of dermis presenting as a shallow open injury with a red, pink wound bed without slough.  Wound Description (Comments):   Present on Admission: Yes   wound care continue       Class 1 obesity based on BMI: Body mass index is 33.7 kg/m.[name] Significantly low or high BMI is associated with higher medical risk.  Underweight - under 18  overweight - 25 to 29 obese - 30 or more Class 1 obesity: BMI of 30.0 to 34 Class 2  obesity: BMI of 35.0 to 39 Class 3 obesity: BMI of 40.0 to 49 Super Morbid Obesity: BMI 50-59 Super-super Morbid Obesity: BMI 60+ Healthy nutrition and physical activity advised as adjunct to other disease management and risk reduction treatments         Discharge Instructions  Allergies as of 08/01/2023       Reactions   Entresto  [sacubitril-valsartan]    hallucinations        Medication List     STOP taking these medications    apixaban  5 MG Tabs tablet Commonly known as: ELIQUIS    Azelastine  HCl 137 MCG/SPRAY Soln   ketoconazole 2 % cream Commonly known as: NIZORAL   losartan  25 MG tablet Commonly known as: COZAAR    sotalol  80 MG tablet Commonly known as: BETAPACE    spironolactone  25 MG tablet Commonly known as: ALDACTONE    triamcinolone  55 MCG/ACT Aero nasal inhaler Commonly known as: NASACORT        TAKE these medications    acetaminophen  650 MG CR tablet Commonly known as: TYLENOL  Take 1,300 mg by mouth every 8 (eight)  hours as needed for pain.   ascorbic acid  500 MG tablet Commonly known as: VITAMIN C  Take 1 tablet (500 mg total) by mouth 2 (two) times daily.   atorvastatin  10 MG tablet Commonly known as: LIPITOR Take 1 tablet (10 mg total) by mouth daily.   cetirizine  10 MG tablet Commonly known as: ZYRTEC  Take 1 tablet (10 mg total) by mouth daily. Take 1 tablet (10 mg) by mouth once daily   collagenase  250 UNIT/GM ointment Commonly known as: SANTYL  Apply topically daily.   docusate sodium  100 MG capsule Commonly known as: COLACE Take 1 capsule (100 mg total) by mouth 2 (two) times daily as needed for mild constipation.   donepezil  5 MG tablet Commonly known as: ARICEPT  TAKE 1 TABLET BY MOUTH EVERYDAY AT BEDTIME   feeding supplement Liqd Take 237 mLs by mouth 3 (three) times daily between meals.   fluticasone -salmeterol 250-50 MCG/ACT Aepb Commonly known as: Wixela Inhub  TAKE 1 PUFF BY MOUTH TWICE A DAY   furosemide  40 MG  tablet Commonly known as: LASIX  Take 1 tablet (40 mg total) by mouth ONCE daily as needed for up to 3 days for increased leg swelling, shortness of breath, weight gain 5+ lbs over 1-2 days. Seek medical care if these symptoms are not improving What changed: additional instructions   levothyroxine  75 MCG tablet Commonly known as: SYNTHROID  TAKE 1 TABLET BY MOUTH EVERY DAY BEFORE BREAKFAST   liver oil-zinc  oxide 40 % ointment Commonly known as: DESITIN Apply topically 2 (two) times daily. Apply Desitin to sacrum and bilat buttocks BID and PRN when turning or cleaning   midodrine  10 MG tablet Commonly known as: PROAMATINE  Take 1 tablet (10 mg total) by mouth 3 (three) times daily with meals.   mirtazapine 15 MG disintegrating tablet Commonly known as: REMERON SOL-TAB Take 0.5 tablets (7.5 mg total) by mouth at bedtime.   multivitamin with minerals Tabs tablet Take 1 tablet by mouth daily.   Pancrelipase  (Lip-Prot-Amyl) 24000-76000 units Cpep Take 1 capsule (24,000 Units total) by mouth 3 (three) times daily before meals.   pantoprazole  40 MG tablet Commonly known as: PROTONIX  Take 1 tablet (40 mg total) by mouth 2 (two) times daily.   QUEtiapine  25 MG tablet Commonly known as: SEROQUEL  Take 1 tablet (25 mg total) by mouth at bedtime.               Discharge Care Instructions  (From admission, onward)           Start     Ordered   08/01/23 0000  Discharge wound care:       Comments: Apply Medihoney to left anterior foot and left leg Q day, then cover with foam dressing.  Change foam dressing Q 3 days or PRN soiling   08/01/23 1022             Contact information for follow-up providers     Shari Daughters, MD Follow up.   Specialty: Internal Medicine Why: Hospital follow up Contact information: [address] [place] [address] [phone]              Contact information for after-discharge care     Destination     [facility] Preferred SNF .   Service: Skilled Nursing Contact information: [address] [place]  [address] [phone]                     Allergies  Allergen Reactions  Entresto  [Sacubitril-Valsartan]     hallucinations     Subjective: pt states feeling okay today, no pain, tolerating diet, denies CP/SOB, abd pain, edema/sweling   Discharge Exam: BP 110/72 (BP Location: Right Arm)   Pulse 71   Temp 98.8 F (37.1 C) (Oral)   Resp 16   Ht 5\' 3"  (1.6 m)   Wt 86 kg   SpO2 100%   BMI 33.59 kg/m  General: Pt is alert, awake, not in acute distress Cardiovascular: RRR, S1/S2 Respiratory: CTA bilaterally Abdominal: Soft, NT, ND, bowel sounds + Extremities: no edema, no cyanosis, no erythema surrounding bandage on foot R     The results of significant diagnostics from this hospitalization (including imaging, microbiology, ancillary and laboratory) are listed below for reference.     Microbiology: Recent Results (from the past 240 hours)  Gastrointestinal Panel by PCR , Stool     Status: None   Collection Time: 07/23/23 10:41 AM   Specimen: Stool  Result Value Ref Range Status   Campylobacter species NOT DETECTED NOT DETECTED Final   Plesimonas shigelloides NOT DETECTED NOT DETECTED Final   Salmonella species NOT DETECTED NOT DETECTED Final   Yersinia enterocolitica NOT DETECTED NOT DETECTED Final   Vibrio species NOT DETECTED NOT DETECTED Final   Vibrio cholerae NOT DETECTED NOT DETECTED Final   Enteroaggregative E coli (EAEC) NOT DETECTED NOT DETECTED Final   Enteropathogenic E coli (EPEC) NOT DETECTED NOT DETECTED Final   Enterotoxigenic E coli (ETEC) NOT DETECTED NOT DETECTED Final   Shiga like toxin producing E coli (STEC) NOT DETECTED NOT DETECTED Final   Shigella/Enteroinvasive E coli (EIEC) NOT DETECTED NOT DETECTED Final   Cryptosporidium NOT DETECTED NOT DETECTED Final   Cyclospora cayetanensis NOT DETECTED NOT  DETECTED Final   Entamoeba histolytica NOT DETECTED NOT DETECTED Final   Giardia lamblia NOT DETECTED NOT DETECTED Final   Adenovirus F40/41 NOT DETECTED NOT DETECTED Final   Astrovirus NOT DETECTED NOT DETECTED Final   Norovirus GI/GII NOT DETECTED NOT DETECTED Final   Rotavirus A NOT DETECTED NOT DETECTED Final   Sapovirus (I, II, IV, and V) NOT DETECTED NOT DETECTED Final    Comment: Performed at [facility], [address] [place], [place] [address]     Labs: BNP (last 3 results) Recent Labs    07/10/23 0850 07/10/23 1540  BNP 672.1* 1,005.1*   Basic Metabolic Panel: Recent Labs  Lab 07/28/23 0431 07/29/23 0325 07/30/23 0438 07/31/23 0603 08/01/23 0440  NA 135 139 137 139 140  K 4.5 4.6 4.6 4.8 4.4  CL 108 111 109 108 109  CO2 22 22 25 25 24   GLUCOSE 98 102* 111* 121* 103*  BUN 66* 58* 47* 42* 43*  CREATININE 1.77* 1.73* 1.44* 1.58* 1.38*  CALCIUM  8.2* 8.4* 8.4* 8.8* 8.5*  MG  --  2.1 2.3 2.1 2.0   Liver Function Tests: No results for input(s): "AST", "ALT", "ALKPHOS", "BILITOT", "PROT", "ALBUMIN" in the last 168 hours. No results for input(s): "LIPASE", "AMYLASE" in the last 168 hours. No results for input(s): "AMMONIA" in the last 168 hours. CBC: Recent Labs  Lab 07/28/23 0431 07/29/23 0325 07/30/23 0438 07/31/23 0603 08/01/23 0440  WBC 9.9 9.7 8.8 10.0 9.7  HGB 7.0* 8.0* 8.2* 8.4* 8.8*  HCT 20.8* 24.9* 25.0* 26.4* 27.1*  MCV 95.4 96.1 96.9 97.8 96.8  PLT 114* 120* 134* 149* 162   Cardiac Enzymes: No results for input(s): "CKTOTAL", "CKMB", "CKMBINDEX", "TROPONINI" in the last 168 hours. BNP:  Invalid input(s): "POCBNP" CBG: No results for input(s): "GLUCAP" in the last 168 hours. D-Dimer No results for input(s): "DDIMER" in the last 72 hours. Hgb A1c No results for input(s): "HGBA1C" in the last 72 hours. Lipid Profile No results for input(s): "CHOL", "HDL", "LDLCALC", "TRIG", "CHOLHDL", "LDLDIRECT" in the last 72 hours. Thyroid   function studies No results for input(s): "TSH", "T4TOTAL", "T3FREE", "THYROIDAB" in the last 72 hours.  Invalid input(s): "FREET3" Anemia work up No results for input(s): "VITAMINB12", "FOLATE", "FERRITIN", "TIBC", "IRON", "RETICCTPCT" in the last 72 hours. Urinalysis    Component Value Date/Time   COLORURINE YELLOW (A) 07/10/2023 1623   APPEARANCEUR CLEAR (A) 07/10/2023 1623   LABSPEC 1.012 07/10/2023 1623   PHURINE 5.0 07/10/2023 1623   GLUCOSEU >=500 (A) 07/10/2023 1623   HGBUR SMALL (A) 07/10/2023 1623   BILIRUBINUR NEGATIVE 07/10/2023 1623   KETONESUR 5 (A) 07/10/2023 1623   PROTEINUR NEGATIVE 07/10/2023 1623   NITRITE NEGATIVE 07/10/2023 1623   LEUKOCYTESUR NEGATIVE 07/10/2023 1623   Sepsis Labs Recent Labs  Lab 07/29/23 0325 07/30/23 0438 07/31/23 0603 08/01/23 0440  WBC 9.7 8.8 10.0 9.7   Microbiology Recent Results (from the past 240 hours)  Gastrointestinal Panel by PCR , Stool     Status: None   Collection Time: 07/23/23 10:41 AM   Specimen: Stool  Result Value Ref Range Status   Campylobacter species NOT DETECTED NOT DETECTED Final   Plesimonas shigelloides NOT DETECTED NOT DETECTED Final   Salmonella species NOT DETECTED NOT DETECTED Final   Yersinia enterocolitica NOT DETECTED NOT DETECTED Final   Vibrio species NOT DETECTED NOT DETECTED Final   Vibrio cholerae NOT DETECTED NOT DETECTED Final   Enteroaggregative E coli (EAEC) NOT DETECTED NOT DETECTED Final   Enteropathogenic E coli (EPEC) NOT DETECTED NOT DETECTED Final   Enterotoxigenic E coli (ETEC) NOT DETECTED NOT DETECTED Final   Shiga like toxin producing E coli (STEC) NOT DETECTED NOT DETECTED Final   Shigella/Enteroinvasive E coli (EIEC) NOT DETECTED NOT DETECTED Final   Cryptosporidium NOT DETECTED NOT DETECTED Final   Cyclospora cayetanensis NOT DETECTED NOT DETECTED Final   Entamoeba histolytica NOT DETECTED NOT DETECTED Final   Giardia lamblia NOT DETECTED NOT DETECTED Final   Adenovirus  F40/41 NOT DETECTED NOT DETECTED Final   Astrovirus NOT DETECTED NOT DETECTED Final   Norovirus GI/GII NOT DETECTED NOT DETECTED Final   Rotavirus A NOT DETECTED NOT DETECTED Final   Sapovirus (I, II, IV, and V) NOT DETECTED NOT DETECTED Final    Comment: Performed at [facility], [address], [place], [place] [address]   Imaging CT FOOT LEFT WO CONTRAST Result Date: 07/10/2023 CLINICAL DATA:  Left leg and foot wound.  Concern for osteomyelitis. EXAM: CT OF THE LOWER LEFT EXTREMITY AND FOOT WITHOUT CONTRAST TECHNIQUE: Multidetector CT imaging of the lower left extremity was performed according to the standard protocol. RADIATION DOSE REDUCTION: This exam was performed according to the departmental dose-optimization program which includes automated exposure control, adjustment of the mA and/or kV according to patient size and/or use of iterative reconstruction technique. COMPARISON:  None Available. FINDINGS: Bones/Joint/Cartilage Status post left total knee arthroplasty. Hardware is intact with appropriate alignment. No acute fracture or dislocation. No evidence of acute osteolysis or erosive changes. Mild degenerative changes of the midfoot and forefoot. Calcaneal enthesopathy at the origin of the central cord of the plantar fascia in the insertion of the Achilles tendon. Ligaments Ligaments are suboptimally evaluated by CT. Muscles and Tendons No intramuscular  fluid collection. Soft tissue Diffuse subcutaneous edema extending along the mid to distal left lower extremity through the forefoot. Cutaneous irregularity along the dorsal forefoot likely represents a wound. No loculated fluid collection. No soft tissue gas. IMPRESSION: 1. Diffuse subcutaneous edema extending along the mid to distal left lower extremity through the forefoot, concerning for cellulitis. Cutaneous irregularity along the dorsal forefoot likely represents a wound. No loculated fluid collection. 2. No acute osseous  abnormality. No evidence of acute osteolysis or erosive changes. 3. Intact left total knee arthroplasty. 4. Mild degenerative changes of the midfoot and forefoot. Electronically Signed   By: Mannie Seek M.D.   On: 07/10/2023 20:27   CT TIBIA FIBULA LEFT WO CONTRAST Result Date: 07/10/2023 CLINICAL DATA:  Left leg and foot wound.  Concern for osteomyelitis. EXAM: CT OF THE LOWER LEFT EXTREMITY AND FOOT WITHOUT CONTRAST TECHNIQUE: Multidetector CT imaging of the lower left extremity was performed according to the standard protocol. RADIATION DOSE REDUCTION: This exam was performed according to the departmental dose-optimization program which includes automated exposure control, adjustment of the mA and/or kV according to patient size and/or use of iterative reconstruction technique. COMPARISON:  None Available. FINDINGS: Bones/Joint/Cartilage Status post left total knee arthroplasty. Hardware is intact with appropriate alignment. No acute fracture or dislocation. No evidence of acute osteolysis or erosive changes. Mild degenerative changes of the midfoot and forefoot. Calcaneal enthesopathy at the origin of the central cord of the plantar fascia in the insertion of the Achilles tendon. Ligaments Ligaments are suboptimally evaluated by CT. Muscles and Tendons No intramuscular fluid collection. Soft tissue Diffuse subcutaneous edema extending along the mid to distal left lower extremity through the forefoot. Cutaneous irregularity along the dorsal forefoot likely represents a wound. No loculated fluid collection. No soft tissue gas. IMPRESSION: 1. Diffuse subcutaneous edema extending along the mid to distal left lower extremity through the forefoot, concerning for cellulitis. Cutaneous irregularity along the dorsal forefoot likely represents a wound. No loculated fluid collection. 2. No acute osseous abnormality. No evidence of acute osteolysis or erosive changes. 3. Intact left total knee arthroplasty. 4. Mild  degenerative changes of the midfoot and forefoot. Electronically Signed   By: Mannie Seek M.D.   On: 07/10/2023 20:27   CT ABDOMEN PELVIS WO CONTRAST Result Date: 07/10/2023 CLINICAL DATA:  Abdominal pain and intermittent vomiting.  Sepsis. EXAM: CT ABDOMEN AND PELVIS WITHOUT CONTRAST TECHNIQUE: Multidetector CT imaging of the abdomen and pelvis was performed following the standard protocol without IV contrast. RADIATION DOSE REDUCTION: This exam was performed according to the departmental dose-optimization program which includes automated exposure control, adjustment of the mA and/or kV according to patient size and/or use of iterative reconstruction technique. COMPARISON:  None Available. FINDINGS: Evaluation of this exam is limited in the absence of intravenous contrast. Lower chest: The visualized lung bases are clear. There is coronary vascular calcification. Pacemaker wires noted. No intra-abdominal free air or free fluid. Hepatobiliary: The liver is unremarkable. No biliary ductal dilatation. Gallstone. No pericholecystic fluid or evidence of acute cholecystitis by CT. Pancreas: Unremarkable. No pancreatic ductal dilatation or surrounding inflammatory changes. Spleen: Normal in size without focal abnormality. Adrenals/Urinary Tract: The adrenal glands unremarkable. Nonobstructing right renal calculi measure up to 3-4 mm. No hydronephrosis. There is no hydronephrosis or nephrolithiasis on the left. Small bilateral renal cysts. Nonspecific bilateral perinephric stranding. Correlation with urinalysis recommended to exclude UTI. The visualized ureters and urinary bladder appear unremarkable. Stomach/Bowel: There is no bowel obstruction or active inflammation. The appendix extends  into the right inguinal canal and appears unremarkable. Vascular/Lymphatic: Mild aortoiliac atherosclerotic disease. The IVC is unremarkable. No portal venous gas. There is no adenopathy. Reproductive: The prostate and seminal  vesicles are grossly unremarkable. No pelvic mass. Other: Small fat containing bilateral inguinal hernias. The right inguinal hernia contains the appendix. Musculoskeletal: Osteopenia with degenerative changes. No acute osseous pathology. IMPRESSION: 1. No acute intra-abdominal or pelvic pathology. 2. Nonobstructing right renal calculi. No hydronephrosis. 3. Cholelithiasis. 4.  Aortic Atherosclerosis (ICD10-I70.0). Electronically Signed   By: Angus Bark M.D.   On: 07/10/2023 11:51   DG Chest Port 1 View Result Date: 07/10/2023 CLINICAL DATA:  Questionable sepsis EXAM: PORTABLE CHEST 1 VIEW COMPARISON:  07/30/2017 FINDINGS: Dual-chamber pacer leads from the left in stable position. Chronic cardiopericardial enlargement. Extensive artifact from EKG leads. There is no edema, consolidation, effusion, or pneumothorax. IMPRESSION: No evidence of active disease. Electronically Signed   By: Ronnette Coke M.D.   On: 07/10/2023 08:45      Time coordinating discharge: over 30 minutes  SIGNED:  Nikkol Pai DO [facility]

## 2023-08-01 NOTE — TOC Transition Note (Signed)
 Transition of Care [facility]) - Discharge Note   Patient Details  Name: Ethan Vega. MRN: [id number] Date of Birth: 16-Aug-1945  Transition of Care [facility]) CM/SW Contact:  Crayton Docker, RN 08/01/2023, 9:55 AM   Clinical Narrative:    Patient to discharge to [facility] today at 1130 via [employer].CM call to Tammy, Admissions, [facility] regarding auth approval. Per Tammy, patient can admit today to Room 608B. CM call placed to patient's daughter, Eze regarding patient's SNF auth approved for [facility]  and [facility] will [facility]. Per patient's daughter will meet patient at SNF. CM alert to Dr. Authur Leghorn regarding [name] approval and RN Elana Grayer regarding BLS transport time and RN report number   Final next level of care: Skilled Nursing Facility Barriers to Discharge: No Barriers Identified   Patient Goals and CMS Choice    SNF  Discharge Placement     SNF           Discharge Plan and Services Additional resources added to the After Visit Summary for     Discharge Planning Services: CM Consult     Social Drivers of Health (SDOH) Interventions SDOH Screenings   Food Insecurity: Patient Unable To Answer (07/10/2023)  Housing: Patient Unable To Answer (07/10/2023)  Transportation Needs: Patient Unable To Answer (07/10/2023)  Utilities: Patient Unable To Answer (07/10/2023)  Depression (PHQ2-9): Low Risk  (01/15/2023)  Social Connections: Patient Unable To Answer (07/10/2023)  Tobacco Use: Low Risk  (07/26/2023)     Readmission Risk Interventions     No data to display

## 2023-08-01 NOTE — TOC Progression Note (Signed)
 Transition of Care (TOC) - Progression Note    Patient Details  Name: Ethan Vega. MRN: [id number] Date of Birth: 09/19/1945  Transition of Care [facility]) CM/SW Contact  Crayton Docker, RN Phone Number: 08/01/2023, 9:15 AM  Clinical Narrative:      CM follow up in [place] regarding auth status, [name] is approved for [employer], Plan [place] ID: [id number], [name] [id number] SNF, approved on 07/31/2023; validity dates: 07/31/2023-08/04/2023, Approved  Expected Discharge Plan: Skilled Nursing Facility Barriers to Discharge: No Barriers Identified  Expected Discharge Plan and Services   Discharge Planning Services: CM Consult   Living arrangements for the past 2 months: Single Family Home (4 steps , handrails x 1)                                       Social Determinants of Health (SDOH) Interventions SDOH Screenings   Food Insecurity: Patient Unable To Answer (07/10/2023)  Housing: Patient Unable To Answer (07/10/2023)  Transportation Needs: Patient Unable To Answer (07/10/2023)  Utilities: Patient Unable To Answer (07/10/2023)  Depression (PHQ2-9): Low Risk  (01/15/2023)  Social Connections: Patient Unable To Answer (07/10/2023)  Tobacco Use: Low Risk  (07/26/2023)    Readmission Risk Interventions     No data to display

## 2023-08-03 DIAGNOSIS — E039 Hypothyroidism, unspecified: Secondary | ICD-10-CM | POA: Diagnosis not present

## 2023-08-04 ENCOUNTER — Telehealth: Payer: Self-pay | Admitting: Cardiology

## 2023-08-04 NOTE — Telephone Encounter (Signed)
 MyChart message sent. Remote rescheduled. Currently hospitalized and ERI 05/13/23.

## 2023-08-04 NOTE — Telephone Encounter (Signed)
  1. Has your device fired? no  2. Is you device beeping? no  3. Are you experiencing draining or swelling at device site? no  4. Are you calling to see if we received your device transmission? Daughter said pt is in the hospital and is scheduled for remote check on 08/07/23 but will still be in hospital. She wanted to make us  aware  5. Have you passed out? no   Please route to Device Clinic Pool

## 2023-08-05 DIAGNOSIS — R600 Localized edema: Secondary | ICD-10-CM | POA: Diagnosis not present

## 2023-08-06 DIAGNOSIS — K259 Gastric ulcer, unspecified as acute or chronic, without hemorrhage or perforation: Secondary | ICD-10-CM | POA: Diagnosis not present

## 2023-08-06 DIAGNOSIS — I82612 Acute embolism and thrombosis of superficial veins of left upper extremity: Secondary | ICD-10-CM | POA: Diagnosis not present

## 2023-08-06 DIAGNOSIS — L03116 Cellulitis of left lower limb: Secondary | ICD-10-CM | POA: Diagnosis not present

## 2023-08-06 DIAGNOSIS — I82A12 Acute embolism and thrombosis of left axillary vein: Secondary | ICD-10-CM | POA: Diagnosis not present

## 2023-08-06 DIAGNOSIS — N179 Acute kidney failure, unspecified: Secondary | ICD-10-CM | POA: Diagnosis not present

## 2023-08-06 NOTE — Telephone Encounter (Signed)
 Spoke with Dr.Parker and EP team in regards to this patient- discussion was to bring patient in for a visit to discuss what should be done.   Message sent to scheduling team.   Thanks!

## 2023-08-07 DIAGNOSIS — K260 Acute duodenal ulcer with hemorrhage: Secondary | ICD-10-CM | POA: Diagnosis not present

## 2023-08-07 DIAGNOSIS — R627 Adult failure to thrive: Secondary | ICD-10-CM | POA: Diagnosis not present

## 2023-08-07 DIAGNOSIS — L24A2 Irritant contact dermatitis due to fecal, urinary or dual incontinence: Secondary | ICD-10-CM | POA: Diagnosis not present

## 2023-08-07 DIAGNOSIS — I959 Hypotension, unspecified: Secondary | ICD-10-CM | POA: Diagnosis not present

## 2023-08-07 DIAGNOSIS — I5022 Chronic systolic (congestive) heart failure: Secondary | ICD-10-CM | POA: Diagnosis not present

## 2023-08-07 DIAGNOSIS — I82B12 Acute embolism and thrombosis of left subclavian vein: Secondary | ICD-10-CM | POA: Diagnosis not present

## 2023-08-08 NOTE — Telephone Encounter (Signed)
 Pt is scheduled on 6/3.

## 2023-08-09 ENCOUNTER — Encounter: Payer: Self-pay | Admitting: Pharmacy Technician

## 2023-08-09 ENCOUNTER — Other Ambulatory Visit: Payer: Self-pay

## 2023-08-09 ENCOUNTER — Inpatient Hospital Stay
Admission: EM | Admit: 2023-08-09 | Discharge: 2023-09-16 | DRG: 871 | Disposition: E | Source: Skilled Nursing Facility | Attending: Internal Medicine | Admitting: Internal Medicine

## 2023-08-09 ENCOUNTER — Emergency Department

## 2023-08-09 DIAGNOSIS — G9341 Metabolic encephalopathy: Secondary | ICD-10-CM | POA: Diagnosis present

## 2023-08-09 DIAGNOSIS — J690 Pneumonitis due to inhalation of food and vomit: Secondary | ICD-10-CM | POA: Diagnosis not present

## 2023-08-09 DIAGNOSIS — R000 Tachycardia, unspecified: Secondary | ICD-10-CM | POA: Diagnosis not present

## 2023-08-09 DIAGNOSIS — Z8249 Family history of ischemic heart disease and other diseases of the circulatory system: Secondary | ICD-10-CM | POA: Diagnosis not present

## 2023-08-09 DIAGNOSIS — F0390 Unspecified dementia without behavioral disturbance: Secondary | ICD-10-CM | POA: Diagnosis present

## 2023-08-09 DIAGNOSIS — D509 Iron deficiency anemia, unspecified: Secondary | ICD-10-CM | POA: Diagnosis present

## 2023-08-09 DIAGNOSIS — I9589 Other hypotension: Secondary | ICD-10-CM | POA: Diagnosis present

## 2023-08-09 DIAGNOSIS — Z7189 Other specified counseling: Secondary | ICD-10-CM | POA: Diagnosis not present

## 2023-08-09 DIAGNOSIS — I480 Paroxysmal atrial fibrillation: Secondary | ICD-10-CM | POA: Diagnosis not present

## 2023-08-09 DIAGNOSIS — Z79890 Hormone replacement therapy (postmenopausal): Secondary | ICD-10-CM

## 2023-08-09 DIAGNOSIS — I517 Cardiomegaly: Secondary | ICD-10-CM | POA: Diagnosis not present

## 2023-08-09 DIAGNOSIS — J189 Pneumonia, unspecified organism: Secondary | ICD-10-CM | POA: Diagnosis not present

## 2023-08-09 DIAGNOSIS — Z1152 Encounter for screening for COVID-19: Secondary | ICD-10-CM | POA: Diagnosis not present

## 2023-08-09 DIAGNOSIS — Z950 Presence of cardiac pacemaker: Secondary | ICD-10-CM | POA: Diagnosis not present

## 2023-08-09 DIAGNOSIS — J45909 Unspecified asthma, uncomplicated: Secondary | ICD-10-CM | POA: Diagnosis present

## 2023-08-09 DIAGNOSIS — R6520 Severe sepsis without septic shock: Secondary | ICD-10-CM | POA: Diagnosis present

## 2023-08-09 DIAGNOSIS — L89311 Pressure ulcer of right buttock, stage 1: Secondary | ICD-10-CM | POA: Diagnosis present

## 2023-08-09 DIAGNOSIS — Z6831 Body mass index (BMI) 31.0-31.9, adult: Secondary | ICD-10-CM

## 2023-08-09 DIAGNOSIS — I428 Other cardiomyopathies: Secondary | ICD-10-CM | POA: Diagnosis not present

## 2023-08-09 DIAGNOSIS — Z66 Do not resuscitate: Secondary | ICD-10-CM | POA: Diagnosis present

## 2023-08-09 DIAGNOSIS — I493 Ventricular premature depolarization: Secondary | ICD-10-CM | POA: Diagnosis present

## 2023-08-09 DIAGNOSIS — R059 Cough, unspecified: Secondary | ICD-10-CM | POA: Diagnosis not present

## 2023-08-09 DIAGNOSIS — R7989 Other specified abnormal findings of blood chemistry: Secondary | ICD-10-CM | POA: Diagnosis not present

## 2023-08-09 DIAGNOSIS — R778 Other specified abnormalities of plasma proteins: Secondary | ICD-10-CM | POA: Diagnosis not present

## 2023-08-09 DIAGNOSIS — Z8674 Personal history of sudden cardiac arrest: Secondary | ICD-10-CM

## 2023-08-09 DIAGNOSIS — I499 Cardiac arrhythmia, unspecified: Secondary | ICD-10-CM | POA: Diagnosis not present

## 2023-08-09 DIAGNOSIS — I10 Essential (primary) hypertension: Secondary | ICD-10-CM | POA: Diagnosis not present

## 2023-08-09 DIAGNOSIS — A419 Sepsis, unspecified organism: Principal | ICD-10-CM

## 2023-08-09 DIAGNOSIS — R1310 Dysphagia, unspecified: Secondary | ICD-10-CM | POA: Diagnosis not present

## 2023-08-09 DIAGNOSIS — Z515 Encounter for palliative care: Secondary | ICD-10-CM

## 2023-08-09 DIAGNOSIS — I2489 Other forms of acute ischemic heart disease: Secondary | ICD-10-CM | POA: Diagnosis present

## 2023-08-09 DIAGNOSIS — Z85820 Personal history of malignant melanoma of skin: Secondary | ICD-10-CM | POA: Diagnosis not present

## 2023-08-09 DIAGNOSIS — E782 Mixed hyperlipidemia: Secondary | ICD-10-CM | POA: Diagnosis present

## 2023-08-09 DIAGNOSIS — K219 Gastro-esophageal reflux disease without esophagitis: Secondary | ICD-10-CM | POA: Diagnosis present

## 2023-08-09 DIAGNOSIS — I771 Stricture of artery: Secondary | ICD-10-CM | POA: Diagnosis not present

## 2023-08-09 DIAGNOSIS — R627 Adult failure to thrive: Secondary | ICD-10-CM | POA: Diagnosis not present

## 2023-08-09 DIAGNOSIS — I4729 Other ventricular tachycardia: Secondary | ICD-10-CM

## 2023-08-09 DIAGNOSIS — I2510 Atherosclerotic heart disease of native coronary artery without angina pectoris: Secondary | ICD-10-CM | POA: Diagnosis not present

## 2023-08-09 DIAGNOSIS — Z79899 Other long term (current) drug therapy: Secondary | ICD-10-CM

## 2023-08-09 DIAGNOSIS — Z96652 Presence of left artificial knee joint: Secondary | ICD-10-CM | POA: Diagnosis present

## 2023-08-09 DIAGNOSIS — E872 Acidosis, unspecified: Secondary | ICD-10-CM | POA: Diagnosis not present

## 2023-08-09 DIAGNOSIS — R0602 Shortness of breath: Secondary | ICD-10-CM

## 2023-08-09 DIAGNOSIS — Z800 Family history of malignant neoplasm of digestive organs: Secondary | ICD-10-CM

## 2023-08-09 DIAGNOSIS — R0689 Other abnormalities of breathing: Secondary | ICD-10-CM | POA: Diagnosis not present

## 2023-08-09 DIAGNOSIS — Z743 Need for continuous supervision: Secondary | ICD-10-CM | POA: Diagnosis not present

## 2023-08-09 DIAGNOSIS — R0989 Other specified symptoms and signs involving the circulatory and respiratory systems: Secondary | ICD-10-CM | POA: Diagnosis not present

## 2023-08-09 DIAGNOSIS — Z95810 Presence of automatic (implantable) cardiac defibrillator: Secondary | ICD-10-CM

## 2023-08-09 DIAGNOSIS — Z85828 Personal history of other malignant neoplasm of skin: Secondary | ICD-10-CM

## 2023-08-09 LAB — MAGNESIUM: Magnesium: 1.6 mg/dL — ABNORMAL LOW (ref 1.7–2.4)

## 2023-08-09 LAB — CBC WITH DIFFERENTIAL/PLATELET
Abs Immature Granulocytes: 0.07 10*3/uL (ref 0.00–0.07)
Basophils Absolute: 0.1 10*3/uL (ref 0.0–0.1)
Basophils Relative: 1 %
Eosinophils Absolute: 0 10*3/uL (ref 0.0–0.5)
Eosinophils Relative: 0 %
HCT: 36.1 % — ABNORMAL LOW (ref 39.0–52.0)
Hemoglobin: 10.9 g/dL — ABNORMAL LOW (ref 13.0–17.0)
Immature Granulocytes: 1 %
Lymphocytes Relative: 4 %
Lymphs Abs: 0.6 10*3/uL — ABNORMAL LOW (ref 0.7–4.0)
MCH: 31.1 pg (ref 26.0–34.0)
MCHC: 30.2 g/dL (ref 30.0–36.0)
MCV: 103.1 fL — ABNORMAL HIGH (ref 80.0–100.0)
Monocytes Absolute: 0.7 10*3/uL (ref 0.1–1.0)
Monocytes Relative: 5 %
Neutro Abs: 13.4 10*3/uL — ABNORMAL HIGH (ref 1.7–7.7)
Neutrophils Relative %: 89 %
Platelets: 268 10*3/uL (ref 150–400)
RBC: 3.5 MIL/uL — ABNORMAL LOW (ref 4.22–5.81)
RDW: 17.1 % — ABNORMAL HIGH (ref 11.5–15.5)
WBC: 15 10*3/uL — ABNORMAL HIGH (ref 4.0–10.5)
nRBC: 0 % (ref 0.0–0.2)

## 2023-08-09 LAB — COMPREHENSIVE METABOLIC PANEL WITH GFR
ALT: 36 U/L (ref 0–44)
AST: 41 U/L (ref 15–41)
Albumin: 2.9 g/dL — ABNORMAL LOW (ref 3.5–5.0)
Alkaline Phosphatase: 53 U/L (ref 38–126)
Anion gap: 13 (ref 5–15)
BUN: 18 mg/dL (ref 8–23)
CO2: 17 mmol/L — ABNORMAL LOW (ref 22–32)
Calcium: 8.5 mg/dL — ABNORMAL LOW (ref 8.9–10.3)
Chloride: 111 mmol/L (ref 98–111)
Creatinine, Ser: 1.2 mg/dL (ref 0.61–1.24)
GFR, Estimated: >60 mL/min (ref >60)
Glucose, Bld: 128 mg/dL — ABNORMAL HIGH (ref 70–99)
Potassium: 4.1 mmol/L (ref 3.5–5.1)
Sodium: 141 mmol/L (ref 135–145)
Total Bilirubin: 1.6 mg/dL — ABNORMAL HIGH (ref 0.0–1.2)
Total Protein: 5.5 g/dL — ABNORMAL LOW (ref 6.5–8.1)

## 2023-08-09 LAB — RESP PANEL BY RT-PCR (RSV, FLU A&B, COVID)  RVPGX2
Influenza A by PCR: NEGATIVE
Influenza B by PCR: NEGATIVE
Resp Syncytial Virus by PCR: NEGATIVE
SARS Coronavirus 2 by RT PCR: NEGATIVE

## 2023-08-09 LAB — TROPONIN I (HIGH SENSITIVITY)
Troponin I (High Sensitivity): 363 ng/L (ref <18)
Troponin I (High Sensitivity): 445 ng/L (ref <18)
Troponin I (High Sensitivity): 479 ng/L (ref <18)

## 2023-08-09 LAB — LACTIC ACID, PLASMA
Lactic Acid, Venous: 3.2 mmol/L (ref 0.5–1.9)
Lactic Acid, Venous: 3 mmol/L (ref 0.5–1.9)
Lactic Acid, Venous: 1.8 mmol/L (ref 0.5–1.9)

## 2023-08-09 LAB — BRAIN NATRIURETIC PEPTIDE: B Natriuretic Peptide: 3948.1 pg/mL — ABNORMAL HIGH (ref 0.0–100.0)

## 2023-08-09 MED ORDER — SODIUM CHLORIDE 0.9 % IV SOLN
2.0000 g | Freq: Once | INTRAVENOUS | Status: AC
  Administered 2023-08-09: 2 g via INTRAVENOUS
  Filled 2023-08-09: qty 20

## 2023-08-09 MED ORDER — PANCRELIPASE (LIP-PROT-AMYL) 12000-38000 UNITS PO CPEP
24000.0000 [IU] | ORAL_CAPSULE | Freq: Three times a day (TID) | ORAL | Status: DC
  Administered 2023-08-10: 24000 [IU] via ORAL
  Filled 2023-08-09 (×2): qty 2

## 2023-08-09 MED ORDER — FLUTICASONE FUROATE-VILANTEROL 200-25 MCG/ACT IN AEPB
1.0000 | INHALATION_SPRAY | Freq: Every day | RESPIRATORY_TRACT | Status: DC
  Administered 2023-08-10: 1 via RESPIRATORY_TRACT
  Filled 2023-08-09: qty 28

## 2023-08-09 MED ORDER — DONEPEZIL HCL 5 MG PO TABS
5.0000 mg | ORAL_TABLET | Freq: Every day | ORAL | Status: DC
  Administered 2023-08-09: 5 mg via ORAL
  Filled 2023-08-09: qty 1

## 2023-08-09 MED ORDER — DM-GUAIFENESIN ER 30-600 MG PO TB12
1.0000 | ORAL_TABLET | Freq: Two times a day (BID) | ORAL | Status: DC
  Administered 2023-08-09 – 2023-08-10 (×2): 1 via ORAL
  Filled 2023-08-09 (×2): qty 1

## 2023-08-09 MED ORDER — MIDODRINE HCL 5 MG PO TABS
10.0000 mg | ORAL_TABLET | Freq: Three times a day (TID) | ORAL | Status: DC
  Administered 2023-08-10 (×2): 10 mg via ORAL
  Filled 2023-08-09 (×2): qty 2

## 2023-08-09 MED ORDER — MAGNESIUM SULFATE 2 GM/50ML IV SOLN: 2.0000 g | Freq: Once | INTRAVENOUS | Status: DC

## 2023-08-09 MED ORDER — MIRTAZAPINE 15 MG PO TBDP
7.5000 mg | ORAL_TABLET | Freq: Every day | ORAL | Status: DC
  Administered 2023-08-09: 7.5 mg via ORAL
  Filled 2023-08-09 (×2): qty 0.5

## 2023-08-09 MED ORDER — LORATADINE 10 MG PO TABS
10.0000 mg | ORAL_TABLET | Freq: Every day | ORAL | Status: DC
  Administered 2023-08-10: 10 mg via ORAL
  Filled 2023-08-09: qty 1

## 2023-08-09 MED ORDER — DOCUSATE SODIUM 100 MG PO CAPS: 100.0000 mg | ORAL_CAPSULE | Freq: Two times a day (BID) | ORAL | Status: DC | PRN

## 2023-08-09 MED ORDER — ATORVASTATIN CALCIUM 10 MG PO TABS
10.0000 mg | ORAL_TABLET | Freq: Every day | ORAL | Status: DC
  Administered 2023-08-10: 10 mg via ORAL
  Filled 2023-08-09: qty 1

## 2023-08-09 MED ORDER — QUETIAPINE FUMARATE 25 MG PO TABS
25.0000 mg | ORAL_TABLET | Freq: Every day | ORAL | Status: DC
  Administered 2023-08-09: 25 mg via ORAL
  Filled 2023-08-09: qty 1

## 2023-08-09 MED ORDER — LEVOTHYROXINE SODIUM 50 MCG PO TABS
75.0000 ug | ORAL_TABLET | Freq: Every day | ORAL | Status: DC
  Administered 2023-08-10: 75 ug via ORAL
  Filled 2023-08-09: qty 2

## 2023-08-09 MED ORDER — LACTATED RINGERS IV BOLUS (SEPSIS)
1000.0000 mL | Freq: Once | INTRAVENOUS | Status: DC
Start: 2023-08-09 — End: 2023-08-09

## 2023-08-09 MED ORDER — SODIUM CHLORIDE 0.9 % IV SOLN
100.0000 mg | Freq: Once | INTRAVENOUS | Status: AC
  Administered 2023-08-09: 100 mg via INTRAVENOUS
  Filled 2023-08-09: qty 100

## 2023-08-09 MED ORDER — SODIUM CHLORIDE 0.9 % IV BOLUS
500.0000 mL | Freq: Once | INTRAVENOUS | Status: AC
  Administered 2023-08-09: 500 mL via INTRAVENOUS

## 2023-08-09 MED ORDER — VITAMIN C 500 MG PO TABS
500.0000 mg | ORAL_TABLET | Freq: Two times a day (BID) | ORAL | Status: DC
  Administered 2023-08-09 – 2023-08-10 (×2): 500 mg via ORAL
  Filled 2023-08-09 (×2): qty 1

## 2023-08-09 MED ORDER — PANTOPRAZOLE SODIUM 40 MG PO TBEC
40.0000 mg | DELAYED_RELEASE_TABLET | Freq: Two times a day (BID) | ORAL | Status: DC
  Administered 2023-08-09 – 2023-08-10 (×2): 40 mg via ORAL
  Filled 2023-08-09 (×2): qty 1

## 2023-08-09 MED ORDER — GUAIFENESIN 100 MG/5ML PO LIQD
5.0000 mL | ORAL | Status: DC | PRN
Start: 2023-08-09 — End: 2023-08-10
  Administered 2023-08-09: 5 mL via ORAL
  Filled 2023-08-09: qty 10

## 2023-08-09 MED ORDER — LACTATED RINGERS IV BOLUS
1000.0000 mL | Freq: Once | INTRAVENOUS | Status: AC
  Administered 2023-08-09: 1000 mL via INTRAVENOUS

## 2023-08-09 MED ORDER — ZINC OXIDE 40 % EX OINT
TOPICAL_OINTMENT | Freq: Two times a day (BID) | CUTANEOUS | Status: DC
  Filled 2023-08-09: qty 113

## 2023-08-09 MED ORDER — ACETAMINOPHEN 650 MG RE SUPP: 650.0000 mg | Freq: Four times a day (QID) | RECTAL | Status: DC | PRN

## 2023-08-09 MED ORDER — DOXYCYCLINE HYCLATE 100 MG PO TABS
100.0000 mg | ORAL_TABLET | Freq: Two times a day (BID) | ORAL | Status: DC
  Administered 2023-08-10: 100 mg via ORAL
  Filled 2023-08-09: qty 1

## 2023-08-09 MED ORDER — ENOXAPARIN SODIUM 40 MG/0.4ML IJ SOSY
40.0000 mg | PREFILLED_SYRINGE | INTRAMUSCULAR | Status: DC
  Administered 2023-08-09 – 2023-08-10 (×2): 40 mg via SUBCUTANEOUS
  Filled 2023-08-09 (×2): qty 0.4

## 2023-08-09 MED ORDER — SENNOSIDES-DOCUSATE SODIUM 8.6-50 MG PO TABS: 1.0000 | ORAL_TABLET | Freq: Every evening | ORAL | Status: DC | PRN

## 2023-08-09 MED ORDER — ACETAMINOPHEN 325 MG PO TABS: 650.0000 mg | ORAL_TABLET | Freq: Four times a day (QID) | ORAL | Status: DC | PRN

## 2023-08-09 MED ORDER — ENSURE ENLIVE PO LIQD
237.0000 mL | Freq: Two times a day (BID) | ORAL | Status: DC
  Administered 2023-08-10: 237 mL via ORAL

## 2023-08-09 MED ORDER — SODIUM CHLORIDE 0.9 % IV SOLN: 1.0000 g | INTRAVENOUS | Status: DC

## 2023-08-09 MED ORDER — MAGNESIUM SULFATE 4 GM/100ML IV SOLN
4.0000 g | Freq: Once | INTRAVENOUS | Status: AC
  Administered 2023-08-09: 4 g via INTRAVENOUS
  Filled 2023-08-09: qty 100

## 2023-08-09 NOTE — H&P (Addendum)
 History and Physical  [name] HYQ:[id number] DOB: [birth year] DOA: 08/09/2023  PCP: Shari Daughters, MD   Chief Complaint: Shortness of breath, cough  HPI: Ethan Vega. is a 78 y.o. male with medical history significant for dementia, basal cell carcinoma, asthma, HTN, HLD nonischemic cardiomyopathy s/p AICD, PAF, prolonged QTc, paroxysmal A-fib who presented to the ED for evaluation of shortness of breath and cough.  Patient had a 22-day hospitalization from 4/24 - 5/16 due to septic shock complicated by GI bleed with discontinuation of his Eliquis  at discharge to rehab facility. Patient reports that a few days ago, the nurse assisting with his medication shoved 7-8 pills down his throat and he felt like "I was choking to death". Since then, he has had persistent congested cough that is occasionally productive. He had associated shortness of breath at the facility today and CXR was done that showed left lower lobe consolidations. EMS was called and patient was found with O2 sats in the low 90s. Per EMS, patient also believed to have aspirated on his lunch.  Patient denies any fevers, chills, chest pain, abdominal pain, nausea, vomiting, palpitations or urinary symptoms.  ED Course: Initial vitals showed temp 98.7, RR 21, HR 110, BP 108/61, SpO2 95% on room air.  Initial labs show sodium 141, K+ 4.1, creatinine 1.20, albumin 2.9, bilirubin 1.6, WBC 15.0, Hgb 10.9, platelet 268, troponin 363->445, lactic acid 3.2->3.0, BNP 3948, negative COVID, flu and RSV test.  Initial EKG shows A-fib with PVCs and RVH. CXR shows cardiomegaly with vascular congestion and trace edema.  Patient received IV Rocephin  and azithromycin as well as 300 cc of IV LR 1 L bolus. TRH was consulted for admission.  Review of Systems: Please see HPI for pertinent positives and negatives. A complete 10 system review of systems are otherwise negative.  Past Medical History:  Diagnosis Date   Actinic keratosis     AICD (automatic cardioverter/defibrillator) present 03/31/2014   a. 03/2014 s/p SJM 2411-36C dual chamber AICD (serial Number [id number])- followed by Dr. Rodolfo Clan   Arthritis    a. L knee.   Asthma    Basal cell carcinoma 08/29/2008   Vertex scalp. Keratotic pattern, ulcerated.   Basal cell carcinoma 01/15/2018   Left distal medial thigh near knee. Fibroepithelioma of pinkus type   Basal cell carcinoma 01/15/2018   Right distal lat. nose ant. inferior edge. Nodular pattern.   Cardiac arrest (HCC)    a. 03/30/2014 VF arrest in setting of NICM >> CPR/defib in community >> s/p dual chamber AICD   Coronary artery disease, non-occlusive    a. cath 03/2014 at [facility]: no sig CAD (OM1 30%, CFX 30%), EF 35%   Dementia (HCC)    Dysplastic nevus 01/31/2020   L mid back paraspinal - severe, excision 03/21/2020   HLD (hyperlipidemia)    HTN (hypertension)    Kidney stones    Melanoma (HCC)    Melanoma resected from scalp   NICM (nonischemic cardiomyopathy) (HCC)    a. 03/2014 Echo:  Inferolateral and lateral HK, EF 50-55%, grade 1 diastolic dysfunction, mild MR, mild LAE, mild RAE, trivial TR, no effusion; 09/2018 Echo: EF 35-40%, impaired relaxation, glob HK, sev apical HK. Nl RV fxn. RVSP . Mildly dil LA.   Paroxysmal atrial fibrillation (HCC) 03/28/2015   Retinal artery occlusion    Past Surgical History:  Procedure Laterality Date   adenomatous polyps     CARDIAC CATHETERIZATION  03/30/2014   CARDIOVERSION  03/30/2014  COLONOSCOPY WITH PROPOFOL  N/A 03/24/2017   Procedure: COLONOSCOPY WITH PROPOFOL ;  Surgeon: Deveron Fly, MD;  Location: [facility] ENDOSCOPY;  Service: Endoscopy;  Laterality: N/A;   COLONOSCOPY WITH PROPOFOL  N/A 01/07/2019   Procedure: COLONOSCOPY WITH PROPOFOL ;  Surgeon: Toledo, Alphonsus Jeans, MD;  Location: [facility] ENDOSCOPY;  Service: Gastroenterology;  Laterality: N/A;   CYSTOSCOPY W/ LITHOLAPAXY / EHL     ESOPHAGOGASTRODUODENOSCOPY N/A 07/23/2023   Procedure: EGD  (ESOPHAGOGASTRODUODENOSCOPY);  Surgeon: Shane Darling, MD;  Location: [facility] ENDOSCOPY;  Service: Endoscopy;  Laterality: N/A;   ESOPHAGOGASTRODUODENOSCOPY N/A 07/26/2023   Procedure: EGD (ESOPHAGOGASTRODUODENOSCOPY);  Surgeon: Shane Darling, MD;  Location: [facility] ENDOSCOPY;  Service: Endoscopy;  Laterality: N/A;   IMPLANTABLE CARDIOVERTER DEFIBRILLATOR IMPLANT N/A 03/31/2014   Procedure: IMPLANTABLE CARDIOVERTER DEFIBRILLATOR IMPLANT;  Surgeon: Tammie Fall, MD;  Location: [facility] CATH LAB;  Service: Cardiovascular;  Laterality: N/A;   JOINT REPLACEMENT     TONSILLECTOMY     "as a kid"   TOTAL KNEE ARTHROPLASTY Left ~ 2010   TOTAL KNEE ARTHROPLASTY WITH REVISION COMPONENTS Left ~ 2011   VARICOSE VEIN SURGERY Left ~ 2014   Social History:  reports that he has never smoked. He has never used smokeless tobacco. He reports that he does not drink alcohol and does not use drugs.  Allergies  Allergen Reactions   Entresto  [Sacubitril-Valsartan]     hallucinations    Family History  Problem Relation Age of Onset   Esophageal cancer Mother        died at 41   Heart attack Father        died at 34   Hypertension Brother    Colon polyps Brother      Prior to Admission medications   Medication Sig Start Date End Date Taking? Authorizing Provider  acetaminophen  (TYLENOL ) 650 MG CR tablet Take 1,300 mg by mouth every 8 (eight) hours as needed for pain.   Yes [provider]  ascorbic acid  (VITAMIN C ) 500 MG tablet Take 1 tablet (500 mg total) by mouth 2 (two) times daily. 08/01/23  Yes Alexander, Natalie, DO  atorvastatin  (LIPITOR) 10 MG tablet Take 1 tablet (10 mg total) by mouth daily. 10/02/22  Yes Shari Daughters, MD  cetirizine  (ZYRTEC ) 10 MG tablet Take 1 tablet (10 mg total) by mouth daily. Take 1 tablet (10 mg) by mouth once daily 05/14/23 08/12/23 Yes Tejan-Sie, S Ahmed, MD  collagenase  (SANTYL ) 250 UNIT/GM ointment Apply topically daily. 08/01/23  Yes Alexander, Natalie, DO   docusate sodium  (COLACE) 100 MG capsule Take 1 capsule (100 mg total) by mouth 2 (two) times daily as needed for mild constipation. 08/01/23  Yes Alexander, Natalie, DO  donepezil  (ARICEPT ) 5 MG tablet TAKE 1 TABLET BY MOUTH EVERYDAY AT BEDTIME 07/01/23  Yes Tejan-Sie, John Muzzy, MD  enoxaparin (LOVENOX) 80 MG/0.8ML injection Inject 80 mg into the skin every 12 (twelve) hours.   Yes [provider]  fluticasone -salmeterol (WIXELA INHUB ) 250-50 MCG/ACT AEPB TAKE 1 PUFF BY MOUTH TWICE A DAY 01/15/23  Yes Tejan-Sie, John Muzzy, MD  furosemide  (LASIX ) 40 MG tablet Take 1 tablet (40 mg total) by mouth ONCE daily as needed for up to 3 days for increased leg swelling, shortness of breath, weight gain 5+ lbs over 1-2 days. Seek medical care if these symptoms are not improving 08/01/23  Yes Alexander, Natalie, DO  levothyroxine  (SYNTHROID ) 75 MCG tablet TAKE 1 TABLET BY MOUTH EVERY DAY BEFORE BREAKFAST 07/07/23  Yes Shari Daughters, MD  lipase/protease/amylase  24000-76000 units CPEP Take 1 capsule (24,000 Units total) by mouth 3 (three) times daily before meals. 08/01/23  Yes Alexander, Natalie, DO  liver oil-zinc  oxide (DESITIN) 40 % ointment Apply topically 2 (two) times daily. Apply Desitin to sacrum and bilat buttocks BID and PRN when turning or cleaning 08/01/23  Yes Alexander, Natalie, DO  midodrine  (PROAMATINE ) 10 MG tablet Take 1 tablet (10 mg total) by mouth 3 (three) times daily with meals. 08/01/23  Yes Alexander, Natalie, DO  mirtazapine  (REMERON  SOL-TAB) 15 MG disintegrating tablet Take 0.5 tablets (7.5 mg total) by mouth at bedtime. 08/01/23  Yes Alexander, Natalie, DO  pantoprazole  (PROTONIX ) 40 MG tablet Take 1 tablet (40 mg total) by mouth 2 (two) times daily. 08/01/23  Yes Alexander, Natalie, DO  QUEtiapine  (SEROQUEL ) 25 MG tablet Take 1 tablet (25 mg total) by mouth at bedtime. 08/01/23  Yes Alexander, Natalie, DO  feeding supplement (ENSURE ENLIVE / ENSURE PLUS) LIQD Take 237 mLs by mouth 3  (three) times daily between meals. 08/01/23   Alexander, Natalie, DO  Multiple Vitamin (MULTIVITAMIN WITH MINERALS) TABS tablet Take 1 tablet by mouth daily. Patient not taking: Reported on 08/09/2023    [provider]    Physical Exam: BP (!) 116/59 (BP Location: Left Arm)   Pulse 89   Temp 98.1 F (36.7 C) (Oral)   Resp 18   SpO2 98%  General: Pleasant, chronically ill-appearing obese elderly man laying in bed with audibly gurgling sounds. No acute distress. HEENT: [place]/AT. Anicteric sclera Neck: Supple. No JVD CV: Tachycardic. Irregular rhythm. No murmurs, rubs, or gallops. No LE edema Pulmonary: Mild tachypnea. Lungs CTAB. Normal effort. Rhonchi throughout. Bibasilar rales. Abdominal: Soft, nontender, nondistended. Normal bowel sounds. Extremities: Palpable radial and DP pulses. Normal ROM. Skin: Warm and dry. No obvious rash or lesions. Decreased skin turgor. Neuro: A&Ox3. Moves all extremities. Normal sensation to light touch. No focal deficit. Psych: Normal mood and affect          Labs on Admission:  Basic Metabolic Panel: Recent Labs  Lab 08/09/23 1511  NA 141  K 4.1  CL 111  CO2 17*  GLUCOSE 128*  BUN 18  CREATININE 1.20  CALCIUM  8.5*   Liver Function Tests: Recent Labs  Lab 08/09/23 1511  AST 41  ALT 36  ALKPHOS 53  BILITOT 1.6*  PROT 5.5*  ALBUMIN 2.9*   No results for input(s): "LIPASE", "AMYLASE" in the last 168 hours. No results for input(s): "AMMONIA" in the last 168 hours. CBC: Recent Labs  Lab 08/09/23 1511  WBC 15.0*  NEUTROABS 13.4*  HGB 10.9*  HCT 36.1*  MCV 103.1*  PLT 268   Cardiac Enzymes: No results for input(s): "CKTOTAL", "CKMB", "CKMBINDEX", "TROPONINI" in the last 168 hours. BNP (last 3 results) Recent Labs    07/10/23 0850 07/10/23 1540 08/09/23 1511  BNP 672.1* 1,005.1* 3,948.1*    ProBNP (last 3 results) No results for input(s): "PROBNP" in the last 8760 hours.  CBG: No results for input(s): "GLUCAP"  in the last 168 hours.  Radiological Exams on Admission: DG Chest 1 View Result Date: 08/09/2023 CLINICAL DATA:  Cough EXAM: CHEST  1 VIEW COMPARISON:  X-ray 07/12/2023 and older FINDINGS: Underinflation. Enlarged cardiopericardial silhouette. Calcified tortuous aorta. Vascular congestion. Question trace edema. Left upper chest battery pack with defibrillator leads along the right side of the heart. No pneumothorax. Overlapping cardiac leads. IMPRESSION: Defibrillator. Enlarged heart with vascular congestion. Question trace edema with some subtle Kerley B-lines. Electronically Signed  By: Adrianna Horde M.D.   On: 08/09/2023 15:35   Assessment/Plan Ethan Vega. is a 78 y.o. male with medical history significant for dementia, basal cell carcinoma, asthma, HTN, HLD nonischemic cardiomyopathy s/p AICD, prolonged QTc, paroxysmal A-fib who presented to the ED for evaluation of shortness of breath and cough and admitted for sepsis.   # Sepsis # Aspiration pneumonia - Presented from rehab facility for progressive congested cough and shortness of breath - CXR at the facility showed evidence of consolidation - Met sepsis criteria with tachycardia, tachypnea, leukocytosis, lactic acidosis and respiratory infection - Continue IV Rocephin  and change IV to oral doxycycline - Mucinex  DM twice daily with as needed Robitussin - Incentive spirometer, flutter valve - Trend lactic acid and administer gentle IV hydration until normal - Follow-up sputum culture and blood culture - Trend CBC, fever curve  # Elevated troponin # NICM # Hx of VF arrest s/p AICD # Hx of NSVT - Presented with congested cough with minimal SOB but no chest pain - BNP significantly elevated with CXR showing vascular congestion - Patient with bibasilar crackles but no JVD or lower extremity edema - Per daughter, patient given home Lasix  at the facility prior to arrival to the ED - After admission, patient found to have  intermittent V. tach's on telemetry lasting about 3 minutes each - Troponins elevated to 363->445, patient remains chest pain-free, EKG without ischemic changes - Elevated troponin likely secondary to demand ischemia in the setting of sepsis and recurrent V. tach - Cardiology consulted, appreciate recs  # Paroxysmal A-fib - Initial EKG showed A-fib rhythm - HR in the 100s-110s - Eliquis  discontinued during last hospitalization due to GI bleed - Cardiology consulted, appreciate recs - Trend and replete electrolytes - Telemetry  # Chronic hypotension - BP slightly soft with SBP in the 100s to 110s - Continue midodrine   # Hypomagnesemia - Magnesium down to 1.6, repleted with IV mag 4 g x 1 - Follow-up morning mag  # Dementia - Alert and oriented x 3 and has poor memory - Continue Aricept , Seroquel  and mirtazapine   # Synthroid  - Last TSH 6.18 a month ago - Continue Synthroid  - Follow-up TSH, free T4  # Generalized weakness - In the setting of acute illness and recent hospitalization - Protein supplementation between meals - PT/OT eval and treat  # HLD - Continue atorvastatin   # GERD - Continue Protonix   DVT prophylaxis: Lovenox     Code Status: Limited: Do not attempt resuscitation (DNR) -DNR-LIMITED -Do Not Intubate/DNI   Consults called: Cardiology  Family Communication: Discussed admission with with daughter at bedside  Severity of Illness: The appropriate patient status for this patient is OBSERVATION. Observation status is judged to be reasonable and necessary in order to provide the required intensity of service to ensure the patient's safety. The patient's presenting symptoms, physical exam findings, and initial radiographic and laboratory data in the context of their medical condition is felt to place them at decreased risk for further clinical deterioration. Furthermore, it is anticipated that the patient will be medically stable for discharge from the hospital  within 2 midnights of admission.   Level of care: Telemetry Medical   This record has been created using Conservation officer, historic buildings. Errors have been sought and corrected, but may not always be located. Such creation errors do not reflect on the standard of care.   Vita Grip, MD 08/09/2023, 7:48 PM [facility] Pager: [phone] Isaiah 41:10   If  7PM-7AM, please contact night-coverage [email or web address] Password [email or web address]

## 2023-08-09 NOTE — Progress Notes (Signed)
 CODE SEPSIS - PHARMACY COMMUNICATION  **Broad Spectrum Antibiotics should be administered within 1 hour of Sepsis diagnosis**  Time Code Sepsis Called/Page Received: 1522  Antibiotics Ordered: ceftriaxone  and doxycycline  Time of 1st antibiotic administration: 1534  Additional action taken by pharmacy: None  If necessary, Name of Provider/Nurse Contacted: None    Ananias Balls ,PharmD Clinical Pharmacist  08/09/2023  3:31 PM

## 2023-08-09 NOTE — ED Provider Notes (Signed)
 [facility] Provider Note    Event Date/Time   First MD Initiated Contact with Patient 08/09/23 1503     (approximate)   History   No chief complaint on file.   HPI  Ethan Vega. is a 78 y.o. male with history of asthma, hypertension, hyperlipidemia, nonischemic cardiomyopathy, status post AICD, history of prolonged QTc, paroxysmal A-fib presenting with shortness of breath and cough.  Per EMS patient was believed to have aspirated his lunch, has been coughing for several days.  Has a chest x-ray done that showed left lower lobe consolidations.  Per EMS he had rales and rhonchi bilaterally, satting 92% on room air, was placed on 4 L nasal cannula by them with improvement to oxygenation to 98%.  For them was in A-fib heart rate 90s to 150s.  He denies any nausea, vomiting, diarrhea.  States that he had some choking episodes and a cough.  He denies any chest pain or fever or headaches.  No urinary symptoms, no nausea, vomiting, diarrhea, abdominal pain.  Independent history obtained from EMS as above.   On independent chart review, patient was admitted in mid May for AKI and hypotension had also developed a GI bleed secondary to gastroenterology ulcers, had his Eliquis  DC'd.  His last echo was in April 2025, showed an EF of 30 to 35%.  Physical Exam   Triage Vital Signs: ED Triage Vitals [08/09/23 1458]  Encounter Vitals Group     BP      Systolic BP Percentile      Diastolic BP Percentile      Pulse      Resp      Temp      Temp src      SpO2      Weight      Height      Head Circumference      Peak Flow      Pain Score 0     Pain Loc      Pain Education      Exclude from Growth Chart     Most recent vital signs: Vitals:   08/09/23 1530 08/09/23 1600  BP: (!) 105/59 108/61  Pulse: (!) 109 (!) 110  Resp: (!) 24 (!) 21  Temp:    SpO2: 95% 95%     General: Awake, no distress.  CV:  Good peripheral perfusion.  Resp:  Normal effort.   Rhonchi on the left, no wheezing Abd:  No distention.  Soft nontender Other:  No lower extremity edema, no unilateral calf swelling or tenderness   ED Results / Procedures / Treatments   Labs (all labs ordered are listed, but only abnormal results are displayed) Labs Reviewed  COMPREHENSIVE METABOLIC PANEL WITH GFR - Abnormal; Notable for the following components:      Result Value   CO2 17 (*)    Glucose, Bld 128 (*)    Calcium  8.5 (*)    Total Protein 5.5 (*)    Albumin 2.9 (*)    Total Bilirubin 1.6 (*)    All other components within normal limits  CBC WITH DIFFERENTIAL/PLATELET - Abnormal; Notable for the following components:   WBC 15.0 (*)    RBC 3.50 (*)    Hemoglobin 10.9 (*)    HCT 36.1 (*)    MCV 103.1 (*)    RDW 17.1 (*)    Neutro Abs 13.4 (*)    Lymphs Abs 0.6 (*)  All other components within normal limits  BRAIN NATRIURETIC PEPTIDE - Abnormal; Notable for the following components:   B Natriuretic Peptide 3,948.1 (*)    All other components within normal limits  LACTIC ACID, PLASMA - Abnormal; Notable for the following components:   Lactic Acid, Venous 3.2 (*)    All other components within normal limits  TROPONIN I (HIGH SENSITIVITY) - Abnormal; Notable for the following components:   Troponin I (High Sensitivity) 363 (*)    All other components within normal limits  RESP PANEL BY RT-PCR (RSV, FLU A&B, COVID)  RVPGX2  CULTURE, BLOOD (ROUTINE X 2)  CULTURE, BLOOD (ROUTINE X 2)  EXPECTORATED SPUTUM ASSESSMENT W GRAM STAIN, RFLX TO RESP C  LACTIC ACID, PLASMA  TROPONIN I (HIGH SENSITIVITY)     EKG  EKG shows, atrial fibrillation with RVR, rate 132, normal QRS, normal QTc, no ischemic ST elevations, baseline is wandering the T wave flattening in 1, aVL, this is a change compared to prior since prior EKG show sinus rhythm   RADIOLOGY On my independent interpretation, chest x-ray without obvious pneumothorax   PROCEDURES:  Critical Care performed:  Yes, see critical care procedure note(s)  Ultrasound ED Echo  Date/Time: 08/09/2023 3:26 PM  Performed by: Shane Darling, MD Authorized by: Shane Darling, MD   Procedure details:    Indications: dyspnea     Views: IVC view     Images: not archived   Findings:    IVC: collapsed   Impression:    Impression comment:  Fluid tolerate IVC Ultrasound ED Thoracic  Date/Time: 08/09/2023 3:26 PM  Performed by: Shane Darling, MD Authorized by: Shane Darling, MD   Procedure details:    Indications: dyspnea     Left lung pleural:  Visualized   Right lung pleural:  Visualized Findings:    A-lines noted throughout: identified     B-lines noted throughout: identified (Focal B-lines on the left anterior)   .Critical Care  Performed by: Shane Darling, MD Authorized by: Shane Darling, MD   Critical care provider statement:    Critical care time (minutes):  40   Critical care was necessary to treat or prevent imminent or life-threatening deterioration of the following conditions:  Sepsis   Critical care was time spent personally by me on the following activities:  Development of treatment plan with patient or surrogate, discussions with consultants, evaluation of patient's response to treatment, examination of patient, ordering and review of laboratory studies, ordering and review of radiographic studies, ordering and performing treatments and interventions, pulse oximetry, re-evaluation of patient's condition and review of old charts    MEDICATIONS ORDERED IN ED: Medications  doxycycline (VIBRAMYCIN) 100 mg in sodium chloride  0.9 % 250 mL IVPB (100 mg Intravenous New Bag/Given 08/09/23 1645)  enoxaparin (LOVENOX) injection 40 mg (has no administration in time range)  acetaminophen  (TYLENOL ) tablet 650 mg (has no administration in time range)    Or  acetaminophen  (TYLENOL ) suppository 650 mg (has no administration in time range)  senna-docusate (Senokot-S) tablet 1 tablet (has no administration in  time range)  sodium chloride  0.9 % bolus 500 mL (has no administration in time range)  dextromethorphan -guaiFENesin  (MUCINEX  DM) 30-600 MG per 12 hr tablet 1 tablet (has no administration in time range)  guaiFENesin  (ROBITUSSIN) 100 MG/5ML liquid 5 mL (has no administration in time range)  lactated ringers  bolus 1,000 mL (0 mLs Intravenous Stopped 08/09/23 1556)  cefTRIAXone  (ROCEPHIN ) 2 g in sodium chloride  0.9 %  100 mL IVPB (0 g Intravenous Stopped 08/09/23 1610)     IMPRESSION / MDM / ASSESSMENT AND PLAN / ED COURSE  I reviewed the triage vital signs and the nursing notes.                              Differential diagnosis includes, but is not limited to, pneumonia, pneumonitis, atypical ACS, sepsis, dehydration, arrhythmia.  Considered volume overload, will get a bedside ultrasound, he does not have any lower extremity edema.  Also considered PE but given the rhonchi and the aspiration, favor pneumonia, aspiration pneumonia above PE at this time.  Will get labs, blood cultures, lactic acid, bedside ultrasound, chest x-ray.  Patient's presentation is most consistent with acute presentation with potential threat to life or bodily function.  Independent interpretation of labs and imaging below.  This ultrasound showed collapsing IVC, focal B-lines on the left, favor pneumonia as well as dehydration.  Will give him IV fluids, will empirically start him on IV antibiotics.  He will need to be admitted for further management.  Consult to hospitalist who is agreeable with the plan for admission and will evaluate the patient.  He is admitted.  The patient is on the cardiac monitor to evaluate for evidence of arrhythmia and/or significant heart rate changes.   Clinical Course as of 08/09/23 1651  Sat Aug 09, 2023  1554 DG Chest 1 View IMPRESSION: Defibrillator. Enlarged heart with vascular congestion. Question trace edema with some subtle Ker[place] B-lines.   [TT]  1555 Independent review of  labs, BNP and troponin are elevated, lactate is also elevated 3.2.  He does have a leukocytosis.  Electrolytes not severely deranged, creatinine is not severely elevated [TT]  1556 Based on initial US , he did not appear volume overloaded, given cxr and bnp, will hold fluids for now and reassess. He is getting his IV antibiotics. [TT]    Clinical Course User Index [TT] Shane Darling, MD     FINAL CLINICAL IMPRESSION(S) / ED DIAGNOSES   Final diagnoses:  Pneumonia due to infectious organism, unspecified laterality, unspecified part of lung  Sepsis, due to unspecified organism, unspecified whether acute organ dysfunction present (HCC)  Elevated troponin  Cough, unspecified type  Shortness of breath     Rx / DC Orders   ED Discharge Orders     None        Note:  This document was prepared using Dragon voice recognition software and may include unintentional dictation errors.    Shane Darling, MD 08/09/23 [phone]

## 2023-08-09 NOTE — Sepsis Progress Note (Signed)
 Secure chat with provider about the potential of ordering more fluids per sepsis protocol. Given fluid noted around lungs, provider said he will be doing a more gentle IV hydration. Will continue to monitor code sepsis.

## 2023-08-09 NOTE — ED Notes (Signed)
 Pt brief changed, peri care performed. New bed linens placed.

## 2023-08-09 NOTE — ED Triage Notes (Signed)
 Bib ems from [facility] with shob. [facility] believe he may have aspirated on his lunch. Pt endorses having a cough for a few days Congestion L lower lobe per xray Rales and rhonchi in all lobes 92% RA, 98% 4L [place] Afib 90-150 98.2 138/86

## 2023-08-09 NOTE — ED Notes (Signed)
 Fluids stopped at this time per Dr Drenda Gentle verbal order.

## 2023-08-09 NOTE — Sepsis Progress Note (Addendum)
 Elink following code sepsis  1811 Notified bedside nurse of need to draw repeat lactic acid.

## 2023-08-09 NOTE — ED Notes (Signed)
 Dr. Drenda Gentle notified of elevated lactic result. No new orders at this time.

## 2023-08-10 ENCOUNTER — Observation Stay

## 2023-08-10 DIAGNOSIS — F0390 Unspecified dementia without behavioral disturbance: Secondary | ICD-10-CM | POA: Diagnosis present

## 2023-08-10 DIAGNOSIS — I480 Paroxysmal atrial fibrillation: Secondary | ICD-10-CM | POA: Diagnosis present

## 2023-08-10 DIAGNOSIS — I428 Other cardiomyopathies: Secondary | ICD-10-CM | POA: Diagnosis present

## 2023-08-10 DIAGNOSIS — Z7189 Other specified counseling: Secondary | ICD-10-CM | POA: Diagnosis not present

## 2023-08-10 DIAGNOSIS — Z950 Presence of cardiac pacemaker: Secondary | ICD-10-CM | POA: Diagnosis not present

## 2023-08-10 DIAGNOSIS — I2489 Other forms of acute ischemic heart disease: Secondary | ICD-10-CM | POA: Diagnosis present

## 2023-08-10 DIAGNOSIS — J189 Pneumonia, unspecified organism: Secondary | ICD-10-CM | POA: Diagnosis not present

## 2023-08-10 DIAGNOSIS — J690 Pneumonitis due to inhalation of food and vomit: Secondary | ICD-10-CM | POA: Diagnosis present

## 2023-08-10 DIAGNOSIS — R1310 Dysphagia, unspecified: Secondary | ICD-10-CM | POA: Diagnosis present

## 2023-08-10 DIAGNOSIS — Z6831 Body mass index (BMI) 31.0-31.9, adult: Secondary | ICD-10-CM | POA: Diagnosis not present

## 2023-08-10 DIAGNOSIS — Z85820 Personal history of malignant melanoma of skin: Secondary | ICD-10-CM | POA: Diagnosis not present

## 2023-08-10 DIAGNOSIS — A419 Sepsis, unspecified organism: Secondary | ICD-10-CM | POA: Diagnosis present

## 2023-08-10 DIAGNOSIS — R0602 Shortness of breath: Secondary | ICD-10-CM | POA: Diagnosis present

## 2023-08-10 DIAGNOSIS — R6520 Severe sepsis without septic shock: Secondary | ICD-10-CM | POA: Diagnosis present

## 2023-08-10 DIAGNOSIS — Z515 Encounter for palliative care: Secondary | ICD-10-CM | POA: Diagnosis not present

## 2023-08-10 DIAGNOSIS — Z66 Do not resuscitate: Secondary | ICD-10-CM | POA: Diagnosis present

## 2023-08-10 DIAGNOSIS — G9341 Metabolic encephalopathy: Secondary | ICD-10-CM | POA: Diagnosis present

## 2023-08-10 DIAGNOSIS — I2510 Atherosclerotic heart disease of native coronary artery without angina pectoris: Secondary | ICD-10-CM | POA: Diagnosis present

## 2023-08-10 DIAGNOSIS — R7989 Other specified abnormal findings of blood chemistry: Secondary | ICD-10-CM | POA: Diagnosis not present

## 2023-08-10 DIAGNOSIS — K219 Gastro-esophageal reflux disease without esophagitis: Secondary | ICD-10-CM | POA: Diagnosis present

## 2023-08-10 DIAGNOSIS — R627 Adult failure to thrive: Secondary | ICD-10-CM | POA: Diagnosis present

## 2023-08-10 DIAGNOSIS — Z1152 Encounter for screening for COVID-19: Secondary | ICD-10-CM | POA: Diagnosis not present

## 2023-08-10 DIAGNOSIS — J45909 Unspecified asthma, uncomplicated: Secondary | ICD-10-CM | POA: Diagnosis present

## 2023-08-10 DIAGNOSIS — E872 Acidosis, unspecified: Secondary | ICD-10-CM | POA: Diagnosis present

## 2023-08-10 DIAGNOSIS — I10 Essential (primary) hypertension: Secondary | ICD-10-CM | POA: Diagnosis present

## 2023-08-10 DIAGNOSIS — I517 Cardiomegaly: Secondary | ICD-10-CM | POA: Diagnosis not present

## 2023-08-10 DIAGNOSIS — R059 Cough, unspecified: Secondary | ICD-10-CM | POA: Diagnosis not present

## 2023-08-10 DIAGNOSIS — D509 Iron deficiency anemia, unspecified: Secondary | ICD-10-CM | POA: Diagnosis present

## 2023-08-10 DIAGNOSIS — L89311 Pressure ulcer of right buttock, stage 1: Secondary | ICD-10-CM | POA: Diagnosis present

## 2023-08-10 DIAGNOSIS — Z8249 Family history of ischemic heart disease and other diseases of the circulatory system: Secondary | ICD-10-CM | POA: Diagnosis not present

## 2023-08-10 LAB — TSH: TSH: 1.317 u[IU]/mL (ref 0.350–4.500)

## 2023-08-10 LAB — VITAMIN D 25 HYDROXY (VIT D DEFICIENCY, FRACTURES): Vit D, 25-Hydroxy: 35.6 ng/mL (ref 30–100)

## 2023-08-10 LAB — CBC
HCT: 34.3 % — ABNORMAL LOW (ref 39.0–52.0)
Hemoglobin: 10.7 g/dL — ABNORMAL LOW (ref 13.0–17.0)
MCH: 31.2 pg (ref 26.0–34.0)
MCHC: 31.2 g/dL (ref 30.0–36.0)
MCV: 100 fL (ref 80.0–100.0)
Platelets: 252 10*3/uL (ref 150–400)
RBC: 3.43 MIL/uL — ABNORMAL LOW (ref 4.22–5.81)
RDW: 16.9 % — ABNORMAL HIGH (ref 11.5–15.5)
WBC: 12.5 10*3/uL — ABNORMAL HIGH (ref 4.0–10.5)
nRBC: 0 % (ref 0.0–0.2)

## 2023-08-10 LAB — BASIC METABOLIC PANEL WITH GFR
Anion gap: 11 (ref 5–15)
BUN: 19 mg/dL (ref 8–23)
CO2: 24 mmol/L (ref 22–32)
Calcium: 8.5 mg/dL — ABNORMAL LOW (ref 8.9–10.3)
Chloride: 106 mmol/L (ref 98–111)
Creatinine, Ser: 1.27 mg/dL — ABNORMAL HIGH (ref 0.61–1.24)
GFR, Estimated: 57 mL/min — ABNORMAL LOW (ref >60)
Glucose, Bld: 103 mg/dL — ABNORMAL HIGH (ref 70–99)
Potassium: 4 mmol/L (ref 3.5–5.1)
Sodium: 141 mmol/L (ref 135–145)

## 2023-08-10 LAB — MAGNESIUM: Magnesium: 2.7 mg/dL — ABNORMAL HIGH (ref 1.7–2.4)

## 2023-08-10 LAB — FOLATE: Folate: 33 ng/mL (ref >5.9)

## 2023-08-10 LAB — IRON AND TIBC
Iron: 11 ug/dL — ABNORMAL LOW (ref 45–182)
Saturation Ratios: 3 % — ABNORMAL LOW (ref 17.9–39.5)
TIBC: 329 ug/dL (ref 250–450)
UIBC: 318 ug/dL

## 2023-08-10 LAB — TROPONIN I (HIGH SENSITIVITY): Troponin I (High Sensitivity): 437 ng/L (ref <18)

## 2023-08-10 LAB — T4, FREE: Free T4: 1.49 ng/dL — ABNORMAL HIGH (ref 0.61–1.12)

## 2023-08-10 LAB — MRSA NEXT GEN BY PCR, NASAL: MRSA by PCR Next Gen: NOT DETECTED

## 2023-08-10 LAB — PHOSPHORUS: Phosphorus: 3.3 mg/dL (ref 2.5–4.6)

## 2023-08-10 LAB — VITAMIN B12: Vitamin B-12: 443 pg/mL (ref 180–914)

## 2023-08-10 LAB — LACTIC ACID, PLASMA: Lactic Acid, Venous: 1.7 mmol/L (ref 0.5–1.9)

## 2023-08-10 MED ORDER — HYDROCOD POLI-CHLORPHE POLI ER 10-8 MG/5ML PO SUER: 5.0000 mL | Freq: Two times a day (BID) | ORAL | Status: DC | PRN

## 2023-08-10 MED ORDER — IPRATROPIUM-ALBUTEROL 0.5-2.5 (3) MG/3ML IN SOLN
3.0000 mL | Freq: Three times a day (TID) | RESPIRATORY_TRACT | Status: DC
  Administered 2023-08-10 – 2023-08-11 (×2): 3 mL via RESPIRATORY_TRACT
  Filled 2023-08-10 (×3): qty 3

## 2023-08-10 MED ORDER — PIPERACILLIN-TAZOBACTAM 3.375 G IVPB
3.3750 g | Freq: Three times a day (TID) | INTRAVENOUS | Status: DC
  Administered 2023-08-10 – 2023-08-11 (×4): 3.375 g via INTRAVENOUS
  Filled 2023-08-10 (×4): qty 50

## 2023-08-10 MED ORDER — IPRATROPIUM-ALBUTEROL 0.5-2.5 (3) MG/3ML IN SOLN
3.0000 mL | Freq: Four times a day (QID) | RESPIRATORY_TRACT | Status: DC | PRN
  Administered 2023-08-10: 3 mL via RESPIRATORY_TRACT
  Filled 2023-08-10 (×2): qty 3

## 2023-08-10 MED ORDER — ATROPINE SULFATE 1 % OP SOLN
2.0000 [drp] | Freq: Three times a day (TID) | OPHTHALMIC | Status: DC
  Administered 2023-08-10 – 2023-08-11 (×3): 2 [drp] via SUBLINGUAL
  Filled 2023-08-10: qty 2

## 2023-08-10 MED ORDER — PANTOPRAZOLE SODIUM 40 MG IV SOLR
40.0000 mg | Freq: Two times a day (BID) | INTRAVENOUS | Status: DC
  Administered 2023-08-10 – 2023-08-11 (×3): 40 mg via INTRAVENOUS
  Filled 2023-08-10 (×4): qty 10

## 2023-08-10 MED ORDER — GUAIFENESIN ER 600 MG PO TB12
600.0000 mg | ORAL_TABLET | Freq: Two times a day (BID) | ORAL | Status: DC
  Filled 2023-08-10: qty 1

## 2023-08-10 MED ORDER — IPRATROPIUM-ALBUTEROL 0.5-2.5 (3) MG/3ML IN SOLN: 3.0000 mL | Freq: Four times a day (QID) | RESPIRATORY_TRACT | Status: DC

## 2023-08-10 MED ORDER — ATROPINE SULFATE 1 % OP SOLN: 2.0000 [drp] | Freq: Three times a day (TID) | OPHTHALMIC | Status: DC | PRN

## 2023-08-10 MED ORDER — HALOPERIDOL LACTATE 5 MG/ML IJ SOLN
2.0000 mg | Freq: Two times a day (BID) | INTRAMUSCULAR | Status: DC | PRN
  Administered 2023-08-11: 2 mg via INTRAVENOUS
  Filled 2023-08-10: qty 1

## 2023-08-10 MED ORDER — LEVOTHYROXINE SODIUM 50 MCG PO TABS: 75.0000 ug | ORAL_TABLET | Freq: Every day | ORAL | Status: DC

## 2023-08-10 MED ORDER — ATROPINE SULFATE 1 % OP SOLN
4.0000 [drp] | Freq: Once | OPHTHALMIC | Status: AC
  Administered 2023-08-10: 4 [drp] via SUBLINGUAL
  Filled 2023-08-10: qty 2

## 2023-08-10 NOTE — NC FL2 (Signed)
 [place]  MEDICAID [id number] LEVEL OF CARE FORM     IDENTIFICATION  Patient Name: Ethan Vega. Birthdate: Oct 22, 1945 Sex: male Admission Date (Current Location): 08/09/2023  [place] and [place] Number:  Chiropodist and Address:  [facility], [address], [place], [place] [address]      Provider Number: [id number]  Attending Physician Name and Address:  Althia Atlas, MD  Relative Name and Phone Number:  Daughter: [name], [phone]    Current Level of Care: Hospital Recommended Level of Care: Skilled Nursing Facility Prior Approval Number:    Date Approved/Denied:   PASRR Number: [id number]  Discharge Plan: SNF    Current Diagnoses: Patient Active Problem List   Diagnosis Date Noted   Sepsis due to pneumonia (HCC) 08/10/2023   Sepsis (HCC) 08/09/2023   Pneumonia due to infectious organism 08/09/2023   Elevated troponin 08/09/2023   Aspiration pneumonia (HCC) 08/09/2023   Hypomagnesemia 08/09/2023   PUD (peptic ulcer disease) 07/30/2023   Gastrointestinal hemorrhage associated with anorectal source 07/29/2023   Melena 07/28/2023   Hyponatremia 07/18/2023   Class 1 obesity 07/17/2023   Failure to thrive in adult 07/17/2023   Anorexia 07/17/2023   Adrenal insufficiency (HCC) 07/17/2023   Acute renal failure superimposed on chronic kidney disease (HCC) 07/14/2023   Cellulitis of left foot 07/14/2023   Pressure injury of skin 07/14/2023   Toxic metabolic encephalopathy 07/14/2023   Septic shock (HCC) 07/10/2023   Mixed hyperlipidemia 10/09/2022   Acquired hypothyroidism 10/09/2022   NSVT (nonsustained ventricular tachycardia) (HCC) 02/16/2017   Paroxysmal atrial fibrillation (HCC) 03/28/2015   Leg swelling 07/27/2014   NICM (nonischemic cardiomyopathy) (HCC) 04/03/2014   HTN (hypertension) 04/03/2014   S/P implantation of automatic cardioverter/defibrillator (AICD) 04/03/2014   Prolonged Q-T interval on ECG  04/03/2014   Encounter for monitoring amiodarone  therapy 04/03/2014   Sinus node dysfunction/bradycardia 04/01/2014   Elevated transaminase level 04/01/2014   Cardiac arrest (HCC) 03/30/2014    Orientation RESPIRATION BLADDER Height & Weight     Self  Normal External catheter, Incontinent Weight:   Height:     BEHAVIORAL SYMPTOMS/MOOD NEUROLOGICAL BOWEL NUTRITION STATUS      Incontinent Diet (NPO, except for ice chips)  AMBULATORY STATUS COMMUNICATION OF NEEDS Skin   Extensive Assist Verbally                         Personal Care Assistance Level of Assistance  Bathing, Feeding, Dressing, Total care Bathing Assistance: Maximum assistance Feeding assistance: Maximum assistance Dressing Assistance: Maximum assistance Total Care Assistance: Maximum assistance   Functional Limitations Info             SPECIAL CARE FACTORS FREQUENCY  PT (By licensed PT), OT (By licensed OT)     PT Frequency: 5 times per week OT Frequency: 5 times per week            Contractures Contractures Info: Not present    Additional Factors Info  Code Status, Allergies Code Status Info: DNR - limited Allergies Info: Entresto            Current Medications (08/10/2023):  This is the current hospital active medication list Current Facility-Administered Medications  Medication Dose Route Frequency Provider Last Rate Last Admin   acetaminophen  (TYLENOL ) tablet 650 mg  650 mg Oral Q6H PRN Amponsah, Prosper M, MD       Or   acetaminophen  (TYLENOL ) suppository 650 mg  650 mg Rectal Q6H PRN Amponsah,  Annette Killings, MD       ascorbic acid  (VITAMIN C ) tablet 500 mg  500 mg Oral BID Amponsah, Prosper M, MD   500 mg at 08/10/23 1308   atorvastatin  (LIPITOR) tablet 10 mg  10 mg Oral Daily Amponsah, Prosper M, MD   10 mg at 08/10/23 6578   atropine 1 % ophthalmic solution 2 drop  2 drop Sublingual TID Althia Atlas, MD   2 drop at 08/10/23 1557   Followed by   Cecily Cohen ON 08/12/2023] atropine 1 %  ophthalmic solution 2 drop  2 drop Sublingual TID PRN Althia Atlas, MD       chlorpheniramine-HYDROcodone (TUSSIONEX) 10-8 MG/5ML suspension 5 mL  5 mL Oral Q12H PRN Althia Atlas, MD       docusate sodium  (COLACE) capsule 100 mg  100 mg Oral BID PRN Amponsah, Prosper M, MD       donepezil  (ARICEPT ) tablet 5 mg  5 mg Oral QHS Amponsah, Prosper M, MD   5 mg at 08/09/23 2146   enoxaparin (LOVENOX) injection 40 mg  40 mg Subcutaneous Q24H Amponsah, Prosper M, MD   40 mg at 08/09/23 1947   feeding supplement (ENSURE ENLIVE / ENSURE PLUS) liquid 237 mL  237 mL Oral BID BM Amponsah, Prosper M, MD   237 mL at 08/10/23 0911   fluticasone  furoate-vilanterol (BREO ELLIPTA) 200-25 MCG/ACT 1 puff  1 puff Inhalation Daily Vita Grip, MD   1 puff at 08/10/23 1100   guaiFENesin  (MUCINEX ) 12 hr tablet 600 mg  600 mg Oral BID Althia Atlas, MD       haloperidol lactate (HALDOL) injection 2 mg  2 mg Intravenous BID PRN Althia Atlas, MD       ipratropium-albuterol  (DUONEB) 0.5-2.5 (3) MG/3ML nebulizer solution 3 mL  3 mL Nebulization TID Althia Atlas, MD       [START ON 08/11/2023] levothyroxine  (SYNTHROID ) tablet 75 mcg  75 mcg Oral Q0600 Althia Atlas, MD       lipase/protease/amylase (CREON ) capsule 24,000 Units  24,000 Units Oral TID AC Amponsah, Prosper M, MD   24,000 Units at 08/10/23 4696   liver oil-zinc  oxide (DESITIN) 40 % ointment   Topical BID Vita Grip, MD   Given at 08/10/23 2952   loratadine (CLARITIN) tablet 10 mg  10 mg Oral Daily Amponsah, Prosper M, MD   10 mg at 08/10/23 8413   midodrine  (PROAMATINE ) tablet 10 mg  10 mg Oral TID WC Amponsah, Prosper M, MD   10 mg at 08/10/23 1229   mirtazapine  (REMERON  SOL-TAB) disintegrating tablet 7.5 mg  7.5 mg Oral QHS Amponsah, Prosper M, MD   7.5 mg at 08/09/23 2146   pantoprazole  (PROTONIX ) injection 40 mg  40 mg Intravenous Q12H Althia Atlas, MD   40 mg at 08/10/23 1557   piperacillin -tazobactam (ZOSYN ) IVPB 3.375 g  3.375 g  Intravenous Q8H Althia Atlas, MD 12.5 mL/hr at 08/10/23 1234 3.375 g at 08/10/23 1234   QUEtiapine  (SEROQUEL ) tablet 25 mg  25 mg Oral QHS Amponsah, Prosper M, MD   25 mg at 08/09/23 2146   senna-docusate (Senokot-S) tablet 1 tablet  1 tablet Oral QHS PRN Amponsah, Prosper M, MD       Facility-Administered Medications Ordered in Other Encounters  Medication Dose Route Frequency Provider Last Rate Last Admin   technetium tetrofosmin  (TC-MYOVIEW ) injection 23.3 millicurie  23.3 millicurie Intravenous Once PRN Ross, Paula V, MD         Discharge Medications: Please see  discharge summary for a list of discharge medications.  Relevant Imaging Results:  Relevant Lab Results:   Additional Information SSN: [id number]  Alexandra Ice, RN

## 2023-08-10 NOTE — Care Management Obs Status (Signed)
 MEDICARE OBSERVATION STATUS NOTIFICATION   Patient Details  Name: Ethan Vega. MRN: [id number] Date of Birth: Jun 05, 1945   Medicare Observation Status Notification Given:   (spoke w/daughter Ethelle Herb over the phone)    Anise Kerns 08/10/2023, 1:37 PM

## 2023-08-10 NOTE — Progress Notes (Signed)
 OT Cancellation Note  Patient Details Name: Ethan Vega. MRN: [id number] DOB: 1945-05-08   Cancelled Treatment:    Reason Eval/Treat Not Completed: Other (comment);Fatigue/lethargy limiting ability to participate. Pt lethargic this AM, RN requesting to hold. OT will continue to follow and re-attempt as appropriate.   Fenris Cauble L. Adron Geisel, OTR/L  08/10/23, 12:43 PM

## 2023-08-10 NOTE — Progress Notes (Signed)
 PT Cancellation Note  Patient Details Name: Ethan Vega. MRN: [id number] DOB: Feb 28, 1946   Cancelled Treatment:    Reason Eval/Treat Not Completed: Fatigue/lethargy limiting ability to participate;Patient's level of consciousness;Other (comment) Per RN, pt was just cleaned up and pericare completed by multiple staff members with pt having great difficulties moving. Pt currently asleep and lethargic and RN requesting to hold at this time. PT will continue to f/u with pt acutely as available and appropriate.   [name] 08/10/2023, 11:52 AM

## 2023-08-10 NOTE — Care Management Obs Status (Signed)
 MEDICARE OBSERVATION STATUS NOTIFICATION   Patient Details  Name: Ethan Vega. MRN: [id number] Date of Birth: Oct 08, 1945   Medicare Observation Status Notification Given:  No    Anise Kerns 08/10/2023, 1:36 PM

## 2023-08-10 NOTE — TOC Progression Note (Addendum)
 Transition of Care (TOC) - Progression Note    Patient Details  Name: Ethan Vega. MRN: [id number] Date of Birth: Jul 09, 1945  [facility]) CM/SW Contact  Alexandra Ice, RN Phone Number: 08/10/2023, 4:11 PM  Clinical Narrative:    Daughter requesting different facility.   PASRR, FL2 pending MD signature, and bedsearch completed.       Expected Discharge Plan and Services                                               Social Determinants of Health (SDOH) Interventions SDOH Screenings   Food Insecurity: Patient Unable To Answer (08/09/2023)  Housing: Patient Unable To Answer (08/09/2023)  Transportation Needs: Patient Unable To Answer (08/09/2023)  Utilities: Patient Unable To Answer (08/09/2023)  Depression (PHQ2-9): Low Risk  (01/15/2023)  Social Connections: Patient Unable To Answer (08/09/2023)  Tobacco Use: Low Risk  (08/09/2023)    Readmission Risk Interventions     No data to display
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
## 2023-08-11 DIAGNOSIS — Z515 Encounter for palliative care: Secondary | ICD-10-CM | POA: Diagnosis not present

## 2023-08-11 DIAGNOSIS — R7989 Other specified abnormal findings of blood chemistry: Secondary | ICD-10-CM

## 2023-08-11 DIAGNOSIS — J189 Pneumonia, unspecified organism: Secondary | ICD-10-CM | POA: Diagnosis not present

## 2023-08-11 DIAGNOSIS — A419 Sepsis, unspecified organism: Secondary | ICD-10-CM | POA: Diagnosis not present

## 2023-08-11 DIAGNOSIS — R0602 Shortness of breath: Secondary | ICD-10-CM

## 2023-08-11 DIAGNOSIS — R059 Cough, unspecified: Secondary | ICD-10-CM | POA: Diagnosis not present

## 2023-08-11 LAB — CBC
HCT: 30.6 % — ABNORMAL LOW (ref 39.0–52.0)
Hemoglobin: 9.5 g/dL — ABNORMAL LOW (ref 13.0–17.0)
MCH: 30.7 pg (ref 26.0–34.0)
MCHC: 31 g/dL (ref 30.0–36.0)
MCV: 99 fL (ref 80.0–100.0)
Platelets: 238 10*3/uL (ref 150–400)
RBC: 3.09 MIL/uL — ABNORMAL LOW (ref 4.22–5.81)
RDW: 17.2 % — ABNORMAL HIGH (ref 11.5–15.5)
WBC: 7.3 10*3/uL (ref 4.0–10.5)
nRBC: 0 % (ref 0.0–0.2)

## 2023-08-11 LAB — BASIC METABOLIC PANEL WITH GFR
Anion gap: 8 (ref 5–15)
BUN: 25 mg/dL — ABNORMAL HIGH (ref 8–23)
CO2: 26 mmol/L (ref 22–32)
Calcium: 8.2 mg/dL — ABNORMAL LOW (ref 8.9–10.3)
Chloride: 107 mmol/L (ref 98–111)
Creatinine, Ser: 1.4 mg/dL — ABNORMAL HIGH (ref 0.61–1.24)
GFR, Estimated: 51 mL/min — ABNORMAL LOW (ref >60)
Glucose, Bld: 100 mg/dL — ABNORMAL HIGH (ref 70–99)
Potassium: 4.4 mmol/L (ref 3.5–5.1)
Sodium: 141 mmol/L (ref 135–145)

## 2023-08-11 LAB — HEPATIC FUNCTION PANEL
ALT: 26 U/L (ref 0–44)
AST: 21 U/L (ref 15–41)
Albumin: 2.5 g/dL — ABNORMAL LOW (ref 3.5–5.0)
Alkaline Phosphatase: 56 U/L (ref 38–126)
Bilirubin, Direct: 0.3 mg/dL — ABNORMAL HIGH (ref 0.0–0.2)
Indirect Bilirubin: 1.2 mg/dL — ABNORMAL HIGH (ref 0.3–0.9)
Total Bilirubin: 1.5 mg/dL — ABNORMAL HIGH (ref 0.0–1.2)
Total Protein: 4.8 g/dL — ABNORMAL LOW (ref 6.5–8.1)

## 2023-08-11 LAB — MAGNESIUM: Magnesium: 2.3 mg/dL (ref 1.7–2.4)

## 2023-08-11 LAB — PHOSPHORUS: Phosphorus: 3.4 mg/dL (ref 2.5–4.6)

## 2023-08-11 LAB — CULTURE, BLOOD (ROUTINE X 2)

## 2023-08-11 LAB — T4, FREE: Free T4: 1.38 ng/dL — ABNORMAL HIGH (ref 0.61–1.12)

## 2023-08-11 MED ORDER — BIOTENE DRY MOUTH MT LIQD
15.0000 mL | Freq: Two times a day (BID) | OROMUCOSAL | Status: DC
  Administered 2023-08-11 – 2023-08-16 (×10): 15 mL via TOPICAL

## 2023-08-11 MED ORDER — GLYCOPYRROLATE 0.2 MG/ML IJ SOLN: 0.2000 mg | Freq: Three times a day (TID) | INTRAMUSCULAR | Status: DC | PRN

## 2023-08-11 MED ORDER — LEVOTHYROXINE SODIUM 50 MCG PO TABS: 75.0000 ug | ORAL_TABLET | Freq: Every day | ORAL | Status: DC

## 2023-08-11 MED ORDER — MORPHINE SULFATE (PF) 2 MG/ML IV SOLN
1.0000 mg | INTRAVENOUS | Status: DC | PRN
  Administered 2023-08-11: 2 mg via INTRAVENOUS
  Administered 2023-08-11: 1 mg via INTRAVENOUS
  Administered 2023-08-12 – 2023-08-16 (×16): 2 mg via INTRAVENOUS
  Administered 2023-08-16: 4 mg via INTRAVENOUS
  Administered 2023-08-16 – 2023-08-17 (×4): 2 mg via INTRAVENOUS
  Filled 2023-08-11 (×6): qty 1
  Filled 2023-08-11: qty 2
  Filled 2023-08-11 (×16): qty 1

## 2023-08-11 MED ORDER — SCOPOLAMINE 1 MG/3DAYS TD PT72
1.0000 | MEDICATED_PATCH | TRANSDERMAL | Status: DC
  Administered 2023-08-11 – 2023-08-14 (×2): 1.5 mg via TRANSDERMAL
  Filled 2023-08-11 (×3): qty 1

## 2023-08-11 MED ORDER — ACETAMINOPHEN 325 MG PO TABS: 650.0000 mg | ORAL_TABLET | Freq: Four times a day (QID) | ORAL | Status: DC | PRN

## 2023-08-11 MED ORDER — GLYCOPYRROLATE 0.2 MG/ML IJ SOLN: 0.2000 mg | Freq: Two times a day (BID) | INTRAMUSCULAR | Status: DC

## 2023-08-11 MED ORDER — POLYVINYL ALCOHOL 1.4 % OP SOLN: 1.0000 [drp] | Freq: Four times a day (QID) | OPHTHALMIC | Status: DC | PRN

## 2023-08-11 MED ORDER — DIPHENHYDRAMINE HCL 50 MG/ML IJ SOLN: 25.0000 mg | INTRAMUSCULAR | Status: DC | PRN

## 2023-08-11 MED ORDER — GLYCOPYRROLATE 0.2 MG/ML IJ SOLN
0.2000 mg | INTRAMUSCULAR | Status: DC
  Administered 2023-08-11: 0.2 mg via INTRAVENOUS
  Filled 2023-08-11 (×2): qty 1

## 2023-08-11 MED ORDER — GLYCOPYRROLATE 1 MG PO TABS: 1.0000 mg | ORAL_TABLET | ORAL | Status: DC | PRN

## 2023-08-11 MED ORDER — LORAZEPAM 2 MG/ML IJ SOLN
1.0000 mg | INTRAMUSCULAR | Status: DC | PRN
  Administered 2023-08-12 – 2023-08-17 (×8): 1 mg via INTRAVENOUS
  Filled 2023-08-11 (×8): qty 1

## 2023-08-11 MED ORDER — ATROPINE SULFATE 1 % OP SOLN: 2.0000 [drp] | Freq: Three times a day (TID) | OPHTHALMIC | Status: DC | PRN

## 2023-08-11 MED ORDER — ACETAMINOPHEN 650 MG RE SUPP: 650.0000 mg | Freq: Four times a day (QID) | RECTAL | Status: DC | PRN

## 2023-08-11 MED ORDER — GLYCOPYRROLATE 0.2 MG/ML IJ SOLN
0.2000 mg | INTRAMUSCULAR | Status: DC | PRN
  Administered 2023-08-11 – 2023-08-17 (×15): 0.2 mg via INTRAVENOUS
  Filled 2023-08-11 (×16): qty 1

## 2023-08-11 MED ORDER — GLYCOPYRROLATE 0.2 MG/ML IJ SOLN
0.2000 mg | INTRAMUSCULAR | Status: DC | PRN
  Administered 2023-08-14 – 2023-08-17 (×2): 0.2 mg via SUBCUTANEOUS
  Filled 2023-08-11 (×2): qty 1

## 2023-08-11 NOTE — Consult Note (Cosign Needed Addendum)
 Consultation Note Date: 08/11/2023 at 1430  Patient Name: Ethan Vega.  DOB: 11-Aug-1945  MRN: [id number]  Age / Sex: 78 y.o., male  PCP: Ethan Daughters, MD Referring Physician: Althia Atlas, MD  HPI/Patient Profile: 78 y.o. male  with past medical history significant for dementia, basal cell carcinoma, asthma, HTN, HLD nonischemic cardiomyopathy s/p AICD, PAF, prolonged QTc and paroxysmal A-fib. He presented from facility via EMS 08/09/23 with c/o shortness of breath and cough. EMS reported on scene, patient had O2 sat in low 90s. Facility staff reported to EMS suspicion of aspirating while eating lunch. Since arriving at facility, he has had persistent, occasionally productive cough. CXR at facility showed LLL consolidations.  On arrival to ED, BP 108/61, HR 110, O2 95% RA, RR 21, temp 98.7. ED labs showed Na+ 141, K+ 4.1, Creatinine 1.20, WBC 15.0, Hgb 10.9, albumin 2.9, troponin 363-> 445, lactic 3.2-> 3.0, BNP 3948. Covid/flu/RSV negative. CXR showed cardiomegaly with vascular congestion and trace edema.  He was admitted for further evaluation and management of sepsis, aspiration PNA and elevated troponin.   Of note, he recently had 22 day hospitalization from 4/24-5/16/25 for septic shock, AKI and GIB with discontinuation of Eliquis . He was subsequently transferred to [facility] for rehabilitation.    PMT was consulted for goals of care conversation. Ethan Vega is well-known to PMT from previous admission  with numerous goals of care conversations with the patient and his daughter, Ethan Vega.   Clinical Assessment and Goals of Care: Extensive chart review completed prior to meeting patient including labs, vital signs, imaging, progress notes, orders, and available advanced directive documents from current and previous encounters. I assessed patient at the bedside and spoke with Ethan Vega, daughter, via  phone, to discuss diagnosis prognosis, GOC, EOL wishes, disposition and options.  Elderly, ill-appearing male sleeping in bed. He is unresponsive to verbal/tactile stimuli and does not respond to sternal rub. He has even respirations that are congested and gurgling. He is in no distress.   As far as functional and nutritional status, Ethan Vega reports that her father has been bed bound since his initial admission in April. She expresses disappointment that her father was not able to have PT at the facility. She feels that his quality of life was poor at the facility after being independent in his home just 1 month ago.  She shares that she has seen a significant decline in health and mentation over the past few days.   We discussed patient's current illness and what it means in the larger context of patient's on-going co-morbidities.  Natural disease trajectory and expectations at EOL were discussed. Ethan Vega shares that her father is at end of life and will not be able to make a recovery.   I attempted to elicit values and goals of care important to the patient. Ethan Vega reiterated the same wishes discussed with her father during his first admission with PMT to include no breathing machine or feeding tube.    The difference between aggressive medical intervention and  comfort care was considered in light of the patient's goals of care.   Advance directives, concepts specific to code status, artificial feeding and hydration, and rehospitalization were considered and discussed.   Education offered regarding concept specific to human mortality and the limitations of medical interventions to prolong life when the body begins to fail to thrive. Ethan Vega shares that she feels her father's dementia has progressed with the stress of being in a facility and no longer having his independence.   Family is facing treatment option decisions and anticipatory care needs. Ethan Vega requests to initiate comfort care measures only  for her father as she does not want to see him go through more suffering with trying to prolong his life. Educated in detail transition to comfort measures in house and what that would entail inclusive of medications to control pain, dyspnea, agitation, nausea, itching, and hiccups.    We discussed stopping all uneccessary measures such as cardiac monitoring, blood draws, needle sticks, and frequent vital signs. Ethan Vega agrees and wishes to proceed with comfort care measures only.    Hospice services outpatient were explained and offered. Ethan Vega does not want her father to transfer to hospice home at this time. She prefers for him to remain in the hospital. Offered to just have conversation with [facility] liaison but she insists that she is not interested in hospice home.   Questions and concerns were addressed. [name] was encouraged to call with questions or concerns.   Primary Decision Maker NEXT OF KIN Ethan Vega, Daughter-HPOA  Physical Exam Vitals reviewed.  Constitutional:      General: He is not in acute distress.    Appearance: He is ill-appearing.     Comments: Unresponsive to verbal stimuli or sternal rub  Pulmonary:     Effort: Pulmonary effort is normal. No respiratory distress.     Comments: Gurgling with respiration Musculoskeletal:     Right lower leg: No edema.     Left lower leg: No edema.  Skin:    General: Skin is warm and dry.     Comments: Scattered, bilateral upper extremity bruising   Recommendations/Plan:         Focus of care is comfort and dignity allowing for natural death Utilize ordered medications to maintain comfort Palliative to follow for symptom management   Palliative Assessment/Data: 10%   Discussed change in status to comfort measures only with Ethan Vega, Ethan Vega, Primary RN, Ethan Vega, TOC and SLP.   Discussed with Ethan Andrews, RN and Ethan Vega, clerk, to [employer]. Ethan Vega to turn off implanted AICD.   Thank you for this consult. Palliative medicine  will continue to follow and assist holistically.   Time Total: 90 minutes  Time spent includes: Detailed review of medical records (labs, imaging, vital signs), medically appropriate exam (mental status, respiratory, cardiac, skin), discussed with treatment team, counseling and educating patient, family and staff, documenting clinical information, medication management and coordination of care.     Ina Manas, Joyice Nodal [facility] Palliative Medicine Team  08/11/2023 3:40 PM  Office [phone]  Pager [phone]     Please contact Palliative Medicine Team providers via AMION for questions and concerns.

## 2023-08-11 NOTE — Progress Notes (Signed)
 RT NT suctioned patient, but patient did not tolerate very well. Rt passed catheter once and during second attempt at passing catheter, pt moved moved his head side to side and his nose began to bleed. RT cleaned up nare and RN notified of small amount of bleeding from patients nose.

## 2023-08-11 NOTE — Progress Notes (Addendum)
 SLP Cancellation Note  Patient Details Name: Ethan Vega. MRN: [id number] DOB: [birth year]   Cancelled treatment:       Reason Eval/Treat Not Completed: Patient not medically ready;Medical issues which prohibited therapy (chart reviewed)   Pt continues to exhibit audible, gurgling coarse secretions requiring RT to suction pt. See chart notes. Noted yesterday's CXR: "No active disease. 2. Mild stable cardiomegaly.".  Pt remains NPO at this time; recommend now including meds. No PRN ice chips at this time. This was discussed w/ MD/NSG via secure chat. ST services will f/u tomorrow w/ pt's status. Recommend frequent oral care for hygiene and stimulation of swallowing.     [name], MS, CCC-SLP Speech Language Pathologist Rehab Services; [facility] [phone] (ascom) Hong Timm 08/11/2023, 2:46 PM

## 2023-08-11 NOTE — Progress Notes (Signed)
 RT attempted to NTS patient due to audible Coarse secretions. Pt did not tolerate well, but RT was able to clear some secretions.

## 2023-08-11 NOTE — Progress Notes (Signed)
 [facility] Progress Note  Patient: Ethan Vega.    ZOX:[id number]  DOA: 08/09/2023     Date of Service: the patient was seen and examined on 08/11/2023  No chief complaint on file.  Brief hospital course: Ethan Stamey. is a 78 y.o. male with medical history significant for dementia, basal cell carcinoma, asthma, HTN, HLD nonischemic cardiomyopathy s/p AICD, PAF, prolonged QTc, paroxysmal A-fib who presented to the ED for evaluation of shortness of breath and cough.  Patient had a 22-day hospitalization from 4/24 - 5/16 due to septic shock complicated by GI bleed with discontinuation of his Eliquis  at discharge to rehab facility. Patient reports that a few days ago, the nurse assisting with his medication shoved 7-8 pills down his throat and he felt like "I was choking to death". Since then, he has had persistent congested cough that is occasionally productive. He had associated shortness of breath at the facility and CXR was done that showed left lower lobe consolidations.  EMS was called and patient was found with O2 sats in the low 90s. Per EMS, patient also believed to have aspirated on his lunch.     ED Course: Tmax 98.7, RR 21, HR 110, BP 108/61, SpO2 95% on RA. Labs: Na 141, K+ 4.1, sCr 1.20, albumin 2.9, bili 1.6, WBC 15.0, Hgb 10.9, platelet 268, troponin 363->445, lactic acid 3.2->3.0, BNP 3948, negative COVID, flu and RSV test.   Initial EKG shows A-fib with PVCs and RVH.  CXR shows cardiomegaly with vascular congestion and trace edema.  Patient received IV Rocephin  and azithromycin as well as 300 cc of IV LR 1 L bolus. TRH was consulted for admission.    Assessment and Plan:    # Sepsis # Aspiration pneumonia - Presented from rehab facility for progressive congested cough and shortness of breath - CXR at the facility showed evidence of consolidation - Met sepsis criteria with tachycardia, tachypnea, leukocytosis, lactic acidosis and respiratory infection. - s/p IV  Rocephin  and doxycycline, started Zosyn  to cover anaerobes - Mucinex  600 mg BID and Tussionex prn - Incentive spirometer, flutter valve with help  - Lactic acid trended down, s/p IV hydration  - Follow-up sputum culture growing Staph aureus and blood culture NGTD MRSA nares negative - Trend CBC, fever curve 5/26 started glycopyrrolate IV and scopolamine patch due to excessive secretions, RN was advised to call RT for deep suctioning PCCM was consulted, discussed with family, they do not want to proceed with intubation, so we will continue current symptomatic treatment and palliative care consulted for goals of care discussion, patient may need hospice care.    # Dysphagia, SLP eval consulted Continue deep suction as needed RT consulted Started atropine sublingual drops to decrease oral secretions, patient is not able to swallow his own secretions and at high risk for aspiration May need a scopolamine patch, we will continue to monitor Continue PPI 40 mg IV twice daily transition to oral when able to swallow Continue aspiration precautions Keep n.p.o. 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 tach's on telemetry lasting about 3  minutes each - Troponins elevated to 363->445, patient remains chest pain-free, EKG without ischemic changes - Elevated troponin likely secondary to demand ischemia in the setting of sepsis and recurrent V. tach - We will consider cardiology consult if persistent arrhythmia     # Paroxysmal A-fib - Initial EKG showed A-fib rhythm - HR in the 100s-110s - Eliquis  discontinued during  last hospitalization due to GI bleed - Trend and replete electrolytes - Telemetry   # Chronic hypotension - BP slightly soft with SBP in the 100s to 110s - Continue midodrine    # Hypomagnesemia - Magnesium down to 1.6, repleted with IV mag 4 g x 1 Mag 2.7 improved - Follow-up morning mag   # Dementia - Alert and oriented x 3 and has poor memory - Continue Aricept , Seroquel  and mirtazapine    # Synthroid  - Last TSH 6.18 a month ago TSH 1.3, free T41.49 --1.38 Slightly elevated Patient is on Synthroid  75 mcg, skip the dose for few days repeat free T4 level.   # Generalized weakness - In the setting of acute illness and recent hospitalization - Protein supplementation between meals - PT/OT eval and treat   # HLD - Continue atorvastatin    # GERD - Continue Protonix   # Iron deficiency anemia, transferrin saturation 3%, patient will benefit from IV iron infusion after resolution of infection.  Start oral iron supplement on discharge.  Repeat iron profile after 3 to 6 months B12 level 443, repeat B12 level after 3 months, keep B12 >400 Folic acid  within normal range   There is no height or weight on file to calculate BMI.  Interventions:  Pressure Injury 08/10/23 Buttocks Right;Lateral Stage 1 -  Intact skin with non-blanchable redness of a localized area usually over a bony prominence. (Active)  08/10/23 1030  Location: Buttocks  Location Orientation: Right;Lateral  Staging: Stage 1 -  Intact skin with non-blanchable redness of a localized area usually over a bony prominence.  Wound Description (Comments):   Present on Admission: No  Dressing Type None 08/11/23 0945     Diet: NPO DVT Prophylaxis: Subcutaneous Lovenox   Advance goals of care discussion: DNR-limited  Family Communication: family was present at bedside, at the time of interview.  The pt provided permission to discuss medical plan with the family. Opportunity was given to ask question and all questions  were answered satisfactorily.  5/25 d/w patient's daughter at bedside  Disposition:  Pt is from SNF, admitted with Aspiration PNA, still on IV antibiotics and n.p.o. due to risk of aspiration, which precludes a safe discharge. Discharge to SNF, when stable, may need few days to improve.  Subjective: No significant events overnight, since last night patient is still in respite distress due to unable to swallow secretions, requiring frequent suctioning.  Medication changed as above.  Goals of care discussed with patient's daughter at bedside. Today patient's mental status is worse, he is not able to communicate due to excessive secretions and seems very altered.  Patient is unable to offer any complaints    Physical Exam: General: Moderate respiratory distress due to excessive secretions Appear in moderate distress, very altered  Eyes: PERRLA ENT: Oral Mucosa Clear, moist  Neck: no JVD,  Cardiovascular: S1 and S2 Present, no Murmur,  Respiratory: Equal air entry bilaterally, excessive gurgling sounds due to secretions, no wheezes. Abdomen: Bowel Sound present, Soft and no tenderness,  Skin: no rashes Extremities: no Pedal edema, no calf tenderness Neurologic: without any new focal findings Gait not checked  due to patient safety concerns  Vitals:   08/11/23 0206 08/11/23 0605 08/11/23 0734 08/11/23 1000  BP: (!) 97/54 111/81  110/64  Pulse: 94 62  97  Resp: (!) 22 (!) 22  (!) 24  Temp: 98.4 F (36.9 C) 98.8 F (37.1 C)  98.4 F (36.9 C)  TempSrc: Oral   Oral  SpO2: 97% 100% 98% 100%    Intake/Output Summary (Last 24 hours) at 08/11/2023 1449 Last data filed at 08/11/2023 0554 Gross per 24 hour  Intake 50.18 ml  Output --  Net 50.18 ml   There were no vitals filed for this visit.  Data Reviewed: I have personally reviewed and interpreted daily labs, tele strips, imagings as discussed above. I reviewed all nursing notes, pharmacy notes, vitals, pertinent old records I have  discussed plan of care as described above with RN and patient/family.  CBC: Recent Labs  Lab 08/09/23 1511 08/10/23 0531 08/11/23 0530  WBC 15.0* 12.5* 7.3  NEUTROABS 13.4*  --   --   HGB 10.9* 10.7* 9.5*  HCT 36.1* 34.3* 30.6*  MCV 103.1* 100.0 99.0  PLT 268 252 238   Basic Metabolic Panel: Recent Labs  Lab 08/09/23 1511 08/09/23 2028 08/10/23 0531 08/11/23 0530  NA 141  --  141 141  K 4.1  --  4.0 4.4  CL 111  --  106 107  CO2 17*  --  24 26  GLUCOSE 128*  --  103* 100*  BUN 18  --  19 25*  CREATININE 1.20  --  1.27* 1.40*  CALCIUM  8.5*  --  8.5* 8.2*  MG  --  1.6* 2.7* 2.3  PHOS  --   --  3.3 3.4    Studies: No results found.   Scheduled Meds:  ascorbic acid   500 mg Oral BID   atorvastatin   10 mg Oral Daily   donepezil   5 mg Oral QHS   enoxaparin (LOVENOX) injection  40 mg Subcutaneous Q24H   feeding supplement  237 mL Oral BID BM   fluticasone  furoate-vilanterol  1 puff Inhalation Daily   glycopyrrolate  0.2 mg Intravenous Q4H   Followed by   glycopyrrolate  0.2 mg Intravenous BID   guaiFENesin   600 mg Oral BID   ipratropium-albuterol   3 mL Nebulization TID   [START ON 08/15/2023] levothyroxine   75 mcg Oral Q0600   lipase/protease/amylase  24,000 Units Oral TID AC   liver oil-zinc  oxide   Topical BID   loratadine  10 mg Oral Daily   midodrine   10 mg Oral TID WC   mirtazapine   7.5 mg Oral QHS   pantoprazole  (PROTONIX ) IV  40 mg Intravenous Q12H   QUEtiapine   25 mg Oral QHS   scopolamine  1 patch Transdermal Q72H   Continuous Infusions:  piperacillin -tazobactam 3.375 g (08/11/23 1319)   PRN Meds: acetaminophen  **OR** acetaminophen , atropine, chlorpheniramine-HYDROcodone, docusate sodium , glycopyrrolate **FOLLOWED BY** glycopyrrolate **FOLLOWED BY** [START ON 08/12/2023] glycopyrrolate, haloperidol lactate, senna-docusate  Time spent: 55 minutes  Author: Althia Atlas. MD [facility] 08/11/2023 2:49 PM  To reach On-call, see care teams to  locate the attending and reach out to them via [email or web address]. If 7PM-7AM, please contact night-coverage If you still have difficulty reaching the attending provider, please page the [facility] (Director on Call) for [facility] on amion for assistance.

## 2023-08-11 NOTE — Evaluation (Signed)
 Physical Therapy Evaluation Patient Details Name: Frazier Balfour. MRN: [id number] DOB: 08-29-1945 Today's Date: 08/11/2023  History of Present Illness  Pt is an 78 y/o M admitted on 08/09/23 after presenting with c/o SOB & cough. Pt is being treated for aspiration PNA, sepsis. Of note, pt with hospitalization 4/24-5/16 for septic shock complicated by GI bleed & d/c to rehab. PMH: dementia, basal cell carcinoma, asthma, HTN, HLD, nonischemic cardiomyopathy s/p AICD, PAF, prolonged QTC, paroxysmal a-fib  Clinical Impression  Pt seen for PT evaluation with co-tx with OT. Pt's daughter present in room, reporting pt was able to converse with her yesterday but doing much worse today. Pt lying in bed, mouth open, gurgling with each breath, SPO2 98% with pt on 2L/min. Pt does react to cold wet cloth on forehead, does attempt to answer a few questions throughout session, although incorrectly, but does not participate in attempting to reposition him in bed. Will continue to follow pt acutely to progress mobility as able.   Educated pt's daughter on importance of pt sitting in upright position to promote improved lung function/breathing ability, as well as lights on/off to reinforce circadian cycle.  Notified team of pt's possible benefit from air mattress to help reduce skin breakdown as pt is not able to reposition himself in bed at this time.        If plan is discharge home, recommend the following: Two people to help with walking and/or transfers;Two people to help with bathing/dressing/bathroom   Can travel by private vehicle   No    Equipment Recommendations Other (comment) (defer to next veue)  Recommendations for Other Services       Functional Status Assessment Patient has had a recent decline in their functional status and demonstrates the ability to make significant improvements in function in a reasonable and predictable amount of time.     Precautions / Restrictions  Precautions Precautions: Fall Restrictions Weight Bearing Restrictions Per Provider Order: No      Mobility  Bed Mobility               General bed mobility comments: +2 to readjust in bed, assisted pt from semi fowler to long sitting with max<>total assist +1    Transfers Overall transfer level:  (unsafe to attempt 2/2 decreased alertness)                      Ambulation/Gait                  Stairs            Wheelchair Mobility     Tilt Bed    Modified Rankin (Stroke Patients Only)       Balance                                             Pertinent Vitals/Pain Pain Assessment Pain Assessment: PAINAD Breathing: occasional labored breathing, short period of hyperventilation Negative Vocalization: occasional moan/groan, low speech, negative/disapproving quality (with movement) Facial Expression: sad, frightened, frown (with movement) Body Language: tense, distressed pacing, fidgeting (with movement) Consolability: distracted or reassured by voice/touch PAINAD Score: 5    Home Living Family/patient expects to be discharged to:: Skilled nursing facility                        Prior  Function               Mobility Comments: Per chart, prior to last admission pt was living at home alone but following last admission pt d/c to SNF rehab at max assist +2 level; daughter reports pt was in rehab ~1 week, had sat EOB with PT.       Extremity/Trunk Assessment   Upper Extremity Assessment Upper Extremity Assessment: Generalized weakness;Difficult to assess due to impaired cognition    Lower Extremity Assessment Lower Extremity Assessment: Generalized weakness;Difficult to assess due to impaired cognition    Cervical / Trunk Assessment Cervical / Trunk Assessment:  (left lateral cervical flexion, attempted to adjust cervical spine to midline with head supported by pillows)  Communication    Communication Communication: Impaired Factors Affecting Communication: Reduced clarity of speech;Difficulty expressing self    Cognition Arousal: Lethargic     PT - Cognitive impairments: Difficult to assess Difficult to assess due to: Level of arousal                     PT - Cognition Comments: Pt lethargic throughout session, opens eyes occasionally throughout session. Negative reaction to cold wet cloth on forehead, grimacing when Respiratory Therapist in room attempting to deep suction pt, pt reassured by PT & OT throughout session. Pt does not recall daughter's name (in room), not oriented to his age. Following commands: Impaired Following commands impaired:  (does not follow simple commands)     Cueing Cueing Techniques: Verbal cues, Tactile cues     General Comments      Exercises     Assessment/Plan    PT Assessment Patient needs continued PT services  PT Problem List Decreased strength;Cardiopulmonary status limiting activity;Decreased range of motion;Decreased cognition;Decreased activity tolerance;Decreased balance;Decreased mobility;Decreased safety awareness;Decreased knowledge of use of DME       PT Treatment Interventions      PT Goals (Current goals can be found in the Care Plan section)  Acute Rehab PT Goals Patient Stated Goal: get better PT Goal Formulation: With family Time For Goal Achievement: 08/25/23 Potential to Achieve Goals: Poor    Frequency Min 1X/week     Co-evaluation   Reason for Co-Treatment: Complexity of the patient's impairments (multi-system involvement);Necessary to address cognition/behavior during functional activity;For patient/therapist safety;To address functional/ADL transfers PT goals addressed during session: Mobility/safety with mobility         AM-PAC PT "6 Clicks" Mobility  Outcome Measure Help needed turning from your back to your side while in a flat bed without using bedrails?: Total Help needed  moving from lying on your back to sitting on the side of a flat bed without using bedrails?: Total Help needed moving to and from a bed to a chair (including a wheelchair)?: Total Help needed standing up from a chair using your arms (e.g., wheelchair or bedside chair)?: Total Help needed to walk in hospital room?: Total Help needed climbing 3-5 steps with a railing? : Total 6 Click Score: 6    End of Session Equipment Utilized During Treatment: Oxygen Activity Tolerance: Treatment limited secondary to medical complications (Comment) Patient left: in bed;with call bell/phone within reach;with bed alarm set;with family/visitor present (respiratory therapist in room) Nurse Communication: Mobility status PT Visit Diagnosis: Muscle weakness (generalized) (M62.81);Other abnormalities of gait and mobility (R26.89);Difficulty in walking, not elsewhere classified (R26.2)    Time: 1610-9604 PT Time Calculation (min) (ACUTE ONLY): 19 min   Charges:   PT Evaluation $PT Eval High Complexity:  1 High   PT General Charges $$ ACUTE PT VISIT: 1 Visit         [name], PT, DPT 08/11/23, 12:52 PM   [name] 08/11/2023, 12:50 PM

## 2023-08-11 NOTE — Evaluation (Signed)
 Occupational Therapy Evaluation Patient Details Name: Ethan Vega. MRN: [id number] DOB: 1945/04/13 Today's Date: 08/11/2023   History of Present Illness   Pt is an 78 y/o M admitted on 08/09/23 after presenting with c/o SOB & cough. Pt is being treated for aspiration PNA, sepsis. Of note, pt with hospitalization 4/24-5/16 for septic shock complicated by GI bleed & d/c to rehab. PMH: dementia, basal cell carcinoma, asthma, HTN, HLD, nonischemic cardiomyopathy s/p AICD, PAF, prolonged QTC, paroxysmal a-fib     Clinical Impressions Pt seen for OT/PT co-eval; daughter present for duration of session and verbalized pt's significant decline since yesterday.  Daughter reported that pt was able to talk with her yesterday, presenting today with decreased alertness, restlessness, audible gurgling from secretions, and generalized weakness.  Pt was able to squeeze OT 's hand on command, but otherwise did not appear oriented to daughter.  Pt assisted therapy slightly with transitioning his trunk forward for therapist to adjust pillows behind him.  Pt unable to hold head up and required positioning assist with rolled towels and pillow to reduce L lateral neck flexion.  Respiratory therapy in during co-eval to attempt deep suctioning, with therapy assisting to facilitate necessary head positioning and promote relaxation.  Pillows adjusted end of session to promote slight tilt to the R with HOB elevated to promote optimal breathing in long sitting position in the bed.  Will continue to follow to work towards goals in OT poc.     If plan is discharge home, recommend the following:   Two people to help with walking and/or transfers;Assistance with cooking/housework;Direct supervision/assist for medications management;Assist for transportation;Supervision due to cognitive status;Two people to help with bathing/dressing/bathroom;Direct supervision/assist for financial management;Help with stairs or ramp for  entrance     Functional Status Assessment   Patient has had a recent decline in their functional status and demonstrates the ability to make significant improvements in function in a reasonable and predictable amount of time.     Equipment Recommendations   Other (comment) (defer to next venue of care)     Recommendations for Other Services         Precautions/Restrictions   Precautions Precautions: Fall Recall of Precautions/Restrictions: Impaired Restrictions Weight Bearing Restrictions Per Provider Order: No     Mobility Bed Mobility Overal bed mobility: Needs Assistance             General bed mobility comments: +2 to readjust in bed, assisted pt from semi fowler to long sitting with max<>total assist +1 Patient Response: Restless  Transfers                   General transfer comment: NT d/t decreased alertness and restlessness      Balance               Standing balance comment: NT d/t decreased alertness and restlessness                           ADL either performed or assessed with clinical judgement   ADL Overall ADL's : Needs assistance/impaired                                       General ADL Comments: total assist for all ADLs at bed level d/t weakness, limited pulmonary function, impaired cognition, restlessness, and discomfort     Vision Baseline  Vision/History: 1 Wears glasses       Perception         Praxis         Pertinent Vitals/Pain Pain Assessment Pain Assessment: Faces Faces Pain Scale: Hurts little more Breathing: occasional labored breathing, short period of hyperventilation Negative Vocalization: occasional moan/groan, low speech, negative/disapproving quality Facial Expression: sad, frightened, frown Body Language: tense, distressed pacing, fidgeting Consolability: distracted or reassured by voice/touch PAINAD Score: 5 Pain Location: generalized Pain Descriptors /  Indicators: Grimacing, Moaning, Discomfort, Guarding Pain Intervention(s): Limited activity within patient's tolerance, Monitored during session, Repositioned     Extremity/Trunk Assessment Upper Extremity Assessment Upper Extremity Assessment: Generalized weakness;Difficult to assess due to impaired cognition   Lower Extremity Assessment Lower Extremity Assessment: Generalized weakness;Difficult to assess due to impaired cognition   Cervical / Trunk Assessment Cervical / Trunk Assessment:  (L lateral flexion requiring support of pillows/rolled towel to promote more midline head)   Communication Communication Communication: Impaired Factors Affecting Communication: Reduced clarity of speech;Difficulty expressing self   Cognition Arousal: Lethargic Behavior During Therapy: Restless, Agitated Cognition: Cognition impaired   Orientation impairments: Place, Time, Situation Awareness: Intellectual awareness impaired, Online awareness impaired Memory impairment (select all impairments): Short-term memory, Working [employer] fast streamer, Non-declarative long-term memory, Geneticist, molecular long-term memory Attention impairment (select first level of impairment): Sustained attention Executive functioning impairment (select all impairments): Reasoning, Problem solving                   Following commands: Impaired Following commands impaired: Follows one step commands inconsistently (pt was able to squeeze OT 's hand on command but otherwise limited)     Cueing  General Comments   Cueing Techniques: Verbal cues;Tactile cues      Exercises Other Exercises Other Exercises: Educated daughter on role of therapy and plan of tx   Shoulder Instructions      Home Living Family/patient expects to be discharged to:: Skilled nursing facility   Available Help at Discharge: Family;Available PRN/intermittently Type of Home: House                       Home Equipment: Agricultural consultant (2 wheels)           Prior Functioning/Environment               Mobility Comments: Per chart, prior to last admission pt was living at home alone but following last admission pt d/c to SNF rehab at max assist +2 level; daughter reports pt was in rehab ~1 week, had sat EOB with PT. ADLs Comments: intermittent assist from family, but had been living alone prior to recent hospitalizations and SNF stay    OT Problem List: Decreased strength;Decreased activity tolerance;Decreased knowledge of use of DME or AE;Decreased safety awareness;Impaired balance (sitting and/or standing);Decreased cognition;Increased edema;Cardiopulmonary status limiting activity;Pain   OT Treatment/Interventions: Self-care/ADL training;Therapeutic exercise;Patient/family education;Therapeutic activities;DME and/or AE instruction;Cognitive remediation/compensation;Balance training      OT Goals(Current goals can be found in the care plan section)   Acute Rehab OT Goals Patient Stated Goal: Daughter wants pt to improve comfort OT Goal Formulation: With family Time For Goal Achievement: 08/25/23 Potential to Achieve Goals: Fair   OT Frequency:  Min 1X/week    Co-evaluation PT/OT/SLP Co-Evaluation/Treatment: Yes Reason for Co-Treatment: Complexity of the patient's impairments (multi-system involvement);Necessary to address cognition/behavior during functional activity;For patient/therapist safety;To address functional/ADL transfers PT goals addressed during session: Mobility/safety with mobility OT goals addressed during session: ADL's and self-care  AM-PAC OT "6 Clicks" Daily Activity     Outcome Measure Help from another person eating meals?: Total Help from another person taking care of personal grooming?: Total Help from another person toileting, which includes using toliet, bedpan, or urinal?: Total Help from another person bathing (including washing, rinsing, drying)?: Total Help from another person to put  on and taking off regular upper body clothing?: Total Help from another person to put on and taking off regular lower body clothing?: Total 6 Click Score: 6   End of Session Nurse Communication: Other (comment) (Notified RN of respiratory therapist's request for RN assist)  Activity Tolerance: Other (comment) (limited by decreased alertness and restlessness.) Patient left: in bed;with call bell/phone within reach;with bed alarm set;with family/visitor present;Other (comment) (MD speaking with daughter and respiratory therapist present)  OT Visit Diagnosis: Other abnormalities of gait and mobility (R26.89);Muscle weakness (generalized) (M62.81)                Time: 4132-4401 OT Time Calculation (min): 19 min Charges:  OT General Charges $OT Visit: 1 Visit OT Evaluation $OT Eval High Complexity: 1 High  Marcus Sewer, MS, OTR/L   Casandra Claw 08/11/2023, 1:43 PM

## 2023-08-12 ENCOUNTER — Encounter: Payer: Self-pay | Admitting: Internal Medicine

## 2023-08-12 DIAGNOSIS — Z7189 Other specified counseling: Secondary | ICD-10-CM

## 2023-08-12 DIAGNOSIS — A419 Sepsis, unspecified organism: Secondary | ICD-10-CM | POA: Diagnosis not present

## 2023-08-12 LAB — CULTURE, RESPIRATORY W GRAM STAIN: Gram Stain: NONE SEEN

## 2023-08-12 NOTE — Progress Notes (Signed)
 [facility] Progress Note  Patient: Ethan Vega.    QMV:[id number]  DOA: 08/09/2023     Date of Service: the patient was seen and examined on 08/12/2023  No chief complaint on file.  Brief hospital course: Ethan Vega. is a 78 y.o. male with medical history significant for dementia, basal cell carcinoma, asthma, HTN, HLD nonischemic cardiomyopathy s/p AICD, PAF, prolonged QTc, paroxysmal A-fib who presented to the ED for evaluation of shortness of breath and cough.  Patient had a 22-day hospitalization from 4/24 - 5/16 due to septic shock complicated by GI bleed with discontinuation of his Eliquis  at discharge to rehab facility. Patient reports that a few days ago, the nurse assisting with his medication shoved 7-8 pills down his throat and he felt like "I was choking to death". Since then, he has had persistent congested cough that is occasionally productive. He had associated shortness of breath at the facility and CXR was done that showed left lower lobe consolidations.  EMS was called and patient was found with O2 sats in the low 90s. Per EMS, patient also believed to have aspirated on his lunch.     ED Course: Tmax 98.7, RR 21, HR 110, BP 108/61, SpO2 95% on RA. Labs: Na 141, K+ 4.1, sCr 1.20, albumin 2.9, bili 1.6, WBC 15.0, Hgb 10.9, platelet 268, troponin 363->445, lactic acid 3.2->3.0, BNP 3948, negative COVID, flu and RSV test.   Initial EKG shows A-fib with PVCs and RVH.  CXR shows cardiomegaly with vascular congestion and trace edema.  Patient received IV Rocephin  and azithromycin as well as 300 cc of IV LR 1 L bolus. TRH was consulted for admission.  5/26 patient was transition to comfort measures only.  Care is following for symptom management.  5/27 patient will benefit from transfer to inpatient hospice if symptoms are well-controlled.  Discussed with patient's daughter, informed to [facility] and hospice care.   Assessment and Plan:  # Comfort measures only Continue  comfort care medications as per palliative care management Continue supplemental O2 inhalation. Continue fall precautions    During hospital stay patient with the following management.  # Sepsis # Aspiration pneumonia - Presented from rehab facility for progressive congested cough and shortness of breath - CXR at the facility showed evidence of consolidation - Met sepsis criteria with tachycardia, tachypnea, leukocytosis, lactic acidosis and respiratory infection. - s/p IV Rocephin  and doxycycline, s/p Zosyn  to cover anaerobes S/p Mucinex  600 mg BID and Tussionex prn - Incentive spirometer, flutter valve with help  - Lactic acid trended down, s/p IV hydration  - sputum culture growing Staph aureus and blood culture NGTD, MRSA nares negative 5/26 started glycopyrrolate IV and scopolamine patch due to excessive secretions, RN was advised to call RT for deep suctioning PCCM was consulted, discussed with family, they do not want to proceed with intubation, so we will continue current symptomatic treatment and palliative care consulted for goals of care discussion, patient may need hospice care.    # Dysphagia, SLP eval consulted Continue deep suction as needed RT consulted Started atropine sublingual drops to decrease oral secretions, patient is not able to swallow his own secretions and at high risk for aspiration May need a scopolamine patch, we will continue to monitor Continue PPI 40 mg IV twice daily transition to oral when able to swallow Continue aspiration precautions Keep n.p.o. for now  # Elevated troponin # NICM # Hx of VF arrest s/p AICD # Hx of NSVT -  Presented with congested cough with minimal SOB but no chest pain - BNP significantly elevated with CXR showing vascular congestion - Patient with bibasilar crackles but no JVD or lower extremity edema - Per daughter, patient given home Lasix  at the facility prior to arrival to the ED - After admission, patient found to  have intermittent V. tach's on telemetry lasting about 3 minutes each - Troponins elevated to 363->445, patient remains chest pain-free, EKG without ischemic changes - Elevated troponin likely secondary to demand ischemia in the setting of sepsis and recurrent V. tach - Plan was to consider cardiology consult if persistent arrhythmia     # Paroxysmal A-fib - Initial EKG showed A-fib rhythm - HR in the 100s-110s - Eliquis  discontinued during last hospitalization due to GI bleed   # Chronic hypotension - BP slightly soft with SBP in the 100s to 110s -s/p midodrine    # Hypomagnesemia - Magnesium down to 1.6, repleted with IV mag 4 g x 1 Mag 2.7 improved   # Dementia - Alert and oriented x 3 and has poor memory - s/p Aricept , Seroquel  and mirtazapine    # Synthroid : Last TSH 6.18 a month ago. TSH 1.3, free T41.49 --1.38 Slightly elevated. Patient was on Synthroid  75 mcg, which was held due to elevated free T4 level.  # Generalized weakness - In the setting of acute illness and recent hospitalization   # HLD: S/p atorvastatin    # GERD: s/p Protonix   # Iron deficiency anemia, transferrin saturation 3%, avoided IV iron due to active infection. S/p oral iron.  B12 and folate within normal range  There is no height or weight on file to calculate BMI.  Interventions:  Pressure Injury 08/10/23 Buttocks Right;Lateral Stage 1 -  Intact skin with non-blanchable redness of a localized area usually over a bony prominence. (Active)  08/10/23 1030  Location: Buttocks  Location Orientation: Right;Lateral  Staging: Stage 1 -  Intact skin with non-blanchable redness of a localized area usually over a bony prominence.  Wound Description (Comments):   Present on Admission: No  Dressing Type None 08/12/23 1553     Diet: NPO DVT Prophylaxis: Comfort measures only  Advance goals of care discussion: DNR-comfort  Family Communication: family was present at bedside, at the time of interview.   5/27 d/w patient's daughter over the phone  Disposition:  Pt is from SNF, admitted with Aspiration PNA, transition to comfort measures only on 5/26.   5/27 discussed with patient's daughter regarding patient hospice placement, she agreed if it is covered by insurance. Hospice care is following, will eval patient tomorrow afternoon Palliative care and TOC following   Subjective: No significant events overnight, yesterday evening patient was transition to comfort measures only due to progressively declining over the period of time as per patient's daughter.  Palliative care discussed goals of care and patient was transition to comfort measures only. Patient remained without acute distress, still has some secretions in the throat, trying to talk but he cannot.   Physical Exam: General: Mild respiratory distress due to secretions.   Eyes: Briefly opened eyes  ENT: Oral Mucosa Clear, moist  Neck: no JVD,  Cardiovascular: S1 and S2 Present, no Murmur,  Respiratory: Equal air entry bilaterally, conducting sounds due to secretions.   Abdomen: Bowel Sound present, Soft and no tenderness,  Extremities: no Pedal edema, no calf tenderness Neurologic: Metabolic encephalopathy, no focal deficits   Vitals:   08/11/23 0734 08/11/23 1000 08/11/23 1938 08/12/23 0802  BP:  110/64 [name])  124/94 130/82  Pulse:  97 98 74  Resp:  (!) 24 (!) 22 (!) 22  Temp:  98.4 F (36.9 C) 98.3 F (36.8 C) 98.4 F (36.9 C)  TempSrc:  Oral Oral Oral  SpO2: 98% 100% 94% 100%    Intake/Output Summary (Last 24 hours) at 08/12/2023 1632 Last data filed at 08/12/2023 1022 Gross per 24 hour  Intake 0 ml  Output 650 ml  Net -650 ml   There were no vitals filed for this visit.  Data Reviewed: I have personally reviewed and interpreted daily labs, tele strips, imagings as discussed above. I reviewed all nursing notes, pharmacy notes, vitals, pertinent old records I have discussed plan of care as described above with  RN and patient/family.  CBC: Recent Labs  Lab 08/09/23 1511 08/10/23 0531 08/11/23 0530  WBC 15.0* 12.5* 7.3  NEUTROABS 13.4*  --   --   HGB 10.9* 10.7* 9.5*  HCT 36.1* 34.3* 30.6*  MCV 103.1* 100.0 99.0  PLT 268 252 238   Basic Metabolic Panel: Recent Labs  Lab 08/09/23 1511 08/09/23 2028 08/10/23 0531 08/11/23 0530  NA 141  --  141 141  K 4.1  --  4.0 4.4  CL 111  --  106 107  CO2 17*  --  24 26  GLUCOSE 128*  --  103* 100*  BUN 18  --  19 25*  CREATININE 1.20  --  1.27* 1.40*  CALCIUM  8.5*  --  8.5* 8.2*  MG  --  1.6* 2.7* 2.3  PHOS  --   --  3.3 3.4    Studies: No results found.   Scheduled Meds:  antiseptic oral rinse  15 mL Topical BID   liver oil-zinc  oxide   Topical BID   scopolamine  1 patch Transdermal Q72H   Continuous Infusions:   PRN Meds: acetaminophen  **OR** acetaminophen , diphenhydrAMINE, glycopyrrolate **OR** glycopyrrolate **OR** glycopyrrolate, LORazepam, morphine  injection, polyvinyl alcohol  Time spent: 40 minutes  Author: Althia Atlas. MD [facility] 08/12/2023 4:32 PM  To reach On-call, see care teams to locate the attending and reach out to them via [email or web address]. If 7PM-7AM, please contact night-coverage If you still have difficulty reaching the attending provider, please page the [facility] (Director on Call) for [facility] on amion for assistance.

## 2023-08-12 NOTE — Care Management Important Message (Signed)
 Important Message  Patient Details  Name: Ethan Vega. MRN: [id number] Date of Birth: 1945/06/26   Important Message Given:  Yes - Medicare IM     Anise Kerns 08/12/2023, 2:19 PM

## 2023-08-12 NOTE — TOC Progression Note (Signed)
 Transition of Care (TOC) - Progression Note    Patient Details  Name: Ethan Vega. MRN: [id number] Date of Birth: 03-Feb-1946  Transition of Care [facility]) CM/SW Contact  Loman Risk, RN Phone Number: 08/12/2023, 12:03 PM  Clinical Narrative:      Per Amalia Badder with Palliative care via secure chat anticipated hospital death      Expected Plan and Services                                               Social Determinants of Health (SDOH) Interventions SDOH Screenings   Food Insecurity: Patient Unable To Answer (08/09/2023)  Housing: Patient Unable To Answer (08/09/2023)  Transportation Needs: Patient Unable To Answer (08/09/2023)  Utilities: Patient Unable To Answer (08/09/2023)  Depression (PHQ2-9): Low Risk  (01/15/2023)  Social Connections: Patient Unable To Answer (08/09/2023)  Tobacco Use: Low Risk  (08/09/2023)    Readmission Risk Interventions     No data to display

## 2023-08-12 NOTE — Progress Notes (Addendum)
 Daily Progress Note   Patient Name: Ethan Vega.       Date: 08/12/2023 DOB: 1946-01-10  Age: 78 y.o. MRN#: [id number] Attending Physician: Althia Atlas, MD Primary Care Physician: Shari Daughters, MD Admit Date: 08/09/2023  Reason for Consultation/Follow-up: Establishing goals of care  Subjective: Notes and labs reviewed.  In to see patient.  He is currently resting in bed with eyes closed and even and unlabored respirations; no family at bedside.  He awakens to touch and attempts to ask who I am.  Introduced myself and my role.  Patient attempted to say something else prior to drifting back to sleep but I was unable to understand what he was trying to say.  Spoke with attending for updates.  Called to speak with patient's daughter Oyama.  Bel[place] discusses that she has a sister that lives in [place] .  She states her sister is coming to terms with the fact that her father is dying.  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 She discusses this decline since his most recent admission. She states she does not want to prolong suffering.   Inquired about hospice facility placement.  She states there will be nobody coming to visit besides her, and she is happy with the care that he has been receiving and would like for him to stay at the hospital.  Discussed expectations at end-of-life.    Length of Stay: 2  Current Medications: Scheduled Meds:   antiseptic oral rinse  15 mL Topical BID   liver oil-zinc  oxide   Topical BID   scopolamine  1 patch Transdermal Q72H    Continuous Infusions:   PRN Meds: acetaminophen  **OR** acetaminophen , diphenhydrAMINE, glycopyrrolate **OR** glycopyrrolate **OR** glycopyrrolate,  LORazepam, morphine  injection, polyvinyl alcohol  Physical Exam Constitutional:      Comments: Opens eyes.  Pulmonary:     Effort: Pulmonary effort is normal.  Skin:    General: Skin is warm and dry.             Vital Signs: BP 130/82 (BP Location: Right Arm)   Pulse 74   Temp 98.4 F (36.9 C) (Oral)   Resp (!) 22   SpO2 100%  SpO2: SpO2: 100 % O2 Device: O2  Device: Nasal Cannula O2 Flow Rate: O2 Flow Rate (L/min): 2 L/min  Intake/output summary:  Intake/Output Summary (Last 24 hours) at 08/12/2023 1245 Last data filed at 08/12/2023 1022 Gross per 24 hour  Intake 0 ml  Output 650 ml  Net -650 ml   LBM: Last BM Date : 08/10/23 Baseline Weight:   Most recent weight:     Patient Active Problem List   Diagnosis Date Noted   Sepsis due to pneumonia (HCC) 08/10/2023   Sepsis (HCC) 08/09/2023   Pneumonia due to infectious organism 08/09/2023   Elevated troponin 08/09/2023   Aspiration pneumonia (HCC) 08/09/2023   Hypomagnesemia 08/09/2023   PUD (peptic ulcer disease) 07/30/2023   Gastrointestinal hemorrhage associated with anorectal source 07/29/2023   Melena 07/28/2023   Hyponatremia 07/18/2023   Class 1 obesity 07/17/2023   Failure to thrive in adult 07/17/2023   Anorexia 07/17/2023   Adrenal insufficiency (HCC) 07/17/2023   Acute renal failure superimposed on chronic kidney disease (HCC) 07/14/2023   Cellulitis of left foot 07/14/2023   Pressure injury of skin 07/14/2023   Toxic metabolic encephalopathy 07/14/2023   Septic shock (HCC) 07/10/2023   Mixed hyperlipidemia 10/09/2022   Acquired hypothyroidism 10/09/2022   NSVT (nonsustained ventricular tachycardia) (HCC) 02/16/2017   Paroxysmal atrial fibrillation (HCC) 03/28/2015   Leg swelling 07/27/2014   NICM (nonischemic cardiomyopathy) (HCC) 04/03/2014   HTN (hypertension) 04/03/2014   S/P implantation of automatic cardioverter/defibrillator (AICD) 04/03/2014   Prolonged Q-T interval on ECG 04/03/2014    Encounter for monitoring amiodarone  therapy 04/03/2014   Sinus node dysfunction/bradycardia 04/01/2014   Elevated transaminase level 04/01/2014   Cardiac arrest (HCC) 03/30/2014    Palliative Care Assessment & Plan     Recommendations/Plan: PMT will continue to follow.   No changes to symptom management recommended at this time. Previous EP note reviewed from 07/30/2023 indicating that patient's ICD was deactivated at that time.   Code Status:    Code Status Orders  (From admission, onward)           Start     Ordered   08/11/23 1532  Do not attempt resuscitation (DNR) - Comfort care  Continuous       Question Answer Comment  If patient has no pulse and is not breathing Do Not Attempt Resuscitation   In Pre-Arrest Conditions (Patient Is Breathing and Has a Pulse) Provide comfort measures. Relieve any mechanical airway obstruction. Avoid transfer unless required for comfort.   Consent: Discussion documented in EHR or advanced directives reviewed      08/11/23 1535           Code Status History     Date Active Date Inactive Code Status Order ID Comments User Context   08/09/2023 1646 08/11/2023 1531 Limited: Do not attempt resuscitation (DNR) -DNR-LIMITED -Do Not Intubate/DNI  [id number]  Vita Grip, MD ED   07/10/2023 1856 08/01/2023 1648 Limited: Do not attempt resuscitation (DNR) -DNR-LIMITED -Do Not Intubate/DNI  [id number]  Delanna Fears, NP ED   07/10/2023 1341 07/10/2023 1856 Full Code [id number]  Erskin Hearing, MD ED   02/16/2017 1054 02/17/2017 1626 Full Code [id number]  Tammie Fall, MD ED   03/31/2014 1707 04/03/2014 1551 Full Code [id number]  Tammie Fall, MD Inpatient   03/30/2014 1652 03/31/2014 1707 Full Code [id number]  Roark Chick, PA-C Inpatient      Advance Directive Documentation    Flowsheet Row Most Recent Value  Type of Advance Directive Out of facility DNR (pink MOST  or yellow form)  Pre-existing out of facility DNR order (yellow form or  pink MOST form) --  "MOST" Form in Place? --       Care plan was discussed with attending  Thank you for allowing the Palliative Medicine Team to assist in the care of this patient.   Meribeth Standard, NP  Please contact Palliative Medicine Team phone at [phone] for questions and concerns.

## 2023-08-12 NOTE — Progress Notes (Addendum)
 [facility] Liaison Note   Received request from [employer], Stafford Eagles, RN to evaluate patient for the [facility].  Spoke with patient's daughter where I  provided extensive education about hospice services as they relate to care at the [facility].    Daughter requested additional time to consider options before proceeding. All questions answered and no concerns voiced.  [facility] information and contact numbers given to daughter & above information shared with TOC.   Please call with any questions/concerns.    Thank you for the opportunity to participate in this patient's care.  [facility] Liaison [phone]

## 2023-08-12 NOTE — Plan of Care (Signed)

## 2023-08-12 NOTE — Progress Notes (Signed)
 SLP Cancellation Note  Patient Details Name: Ethan Vega. MRN: [id number] DOB: 12/27/1945   Cancelled treatment:       Reason Eval/Treat Not Completed:  (chart reviewed)  Per chart notes, pt has continued to have excessive secretions which he is unable to manage. Pt has remained NPO in recent days. MD had initiated glycopyrrolate IV and scopolamine patch due to the excessive secretions, NSG was advised to call RT for deep suctioning.  Per Palliative Care and MD notes today, pt has transitioned to Comfort Care status w/ hospital death expected per notes. ST services will sign off at this time.     [name], MS, CCC-SLP Speech Language Pathologist Rehab Services; [facility] [phone] (ascom) [name] 08/12/2023, 7:41 AM

## 2023-08-12 NOTE — Progress Notes (Signed)
 Patient just transferred from 218 to room 106. Daughter, Clayburn, notified of the move.

## 2023-08-13 DIAGNOSIS — A419 Sepsis, unspecified organism: Secondary | ICD-10-CM | POA: Diagnosis not present

## 2023-08-13 NOTE — Plan of Care (Signed)

## 2023-08-13 NOTE — TOC Progression Note (Signed)
 Transition of Care (TOC) - Progression Note    Patient Details  Name: Ethan Vega. MRN: [id number] Date of Birth: 12-27-1945  Transition of Care [facility]) CM/SW Contact  Loman Risk, RN Phone Number: 08/13/2023, 8:45 AM  Clinical Narrative:     Notified by MD that he has further discussed inpatient hospice and daughter is in agreement if covered by insurance.  Referral made to Marylou Sobers with [employer], contracting        Expected Discharge Plan and Services                                               Social Determinants of Health (SDOH) Interventions SDOH Screenings   Food Insecurity: Patient Unable To Answer (08/09/2023)  Housing: Patient Unable To Answer (08/09/2023)  Transportation Needs: Patient Unable To Answer (08/09/2023)  Utilities: Patient Unable To Answer (08/09/2023)  Depression (PHQ2-9): Low Risk  (01/15/2023)  Social Connections: Patient Unable To Answer (08/09/2023)  Tobacco Use: Low Risk  (08/09/2023)    Readmission Risk Interventions     No data to display
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
## 2023-08-13 NOTE — Plan of Care (Signed)
  Problem: Education: Goal: Knowledge of General Education information will improve Description: Including pain rating scale, medication(s)/side effects and non-pharmacologic comfort measures Outcome: Progressing   Problem: Health Behavior/Discharge Planning: Goal: Ability to manage health-related needs will improve Outcome: Progressing   Problem: Clinical Measurements: Goal: Ability to maintain clinical measurements within normal limits will improve Outcome: Progressing Goal: Will remain free from infection Outcome: Progressing Goal: Diagnostic test results will improve Outcome: Progressing Goal: Respiratory complications will improve Outcome: Progressing Goal: Cardiovascular complication will be avoided Outcome: Progressing   Problem: Activity: Goal: Risk for activity intolerance will decrease Outcome: Progressing   Problem: Nutrition: Goal: Adequate nutrition will be maintained Outcome: Progressing   Problem: Coping: Goal: Level of anxiety will decrease Outcome: Progressing   Problem: Elimination: Goal: Will not experience complications related to bowel motility Outcome: Progressing Goal: Will not experience complications related to urinary retention Outcome: Progressing   Problem: Pain Managment: Goal: General experience of comfort will improve and/or be controlled Outcome: Progressing   Problem: Safety: Goal: Ability to remain free from injury will improve Outcome: Progressing   Problem: Skin Integrity: Goal: Risk for impaired skin integrity will decrease Outcome: Progressing   Problem: Education: Goal: Knowledge of the prescribed therapeutic regimen will improve Outcome: Progressing   Problem: Coping: Goal: Ability to identify and develop effective coping behavior will improve Outcome: Progressing   Problem: Clinical Measurements: Goal: Quality of life will improve Outcome: Progressing   Problem: Respiratory: Goal: Verbalizations of increased ease  of respirations will increase Outcome: Progressing   Problem: Pain Management: Goal: Satisfaction with pain management regimen will improve Outcome: Progressing

## 2023-08-13 NOTE — Progress Notes (Addendum)
 [facility] Liaison Note  Spoke with daughter this afternoon about the Hospice Home (IPU).  She and the family are still trying to decide on how they want to proceed with discharge planning. Hospital team notified.    Please call with any Hospice related questions or concerns.  Thank you for the opportunity to participate in this patient's care.   [facility] Liaison [phone]

## 2023-08-13 NOTE — Progress Notes (Signed)
 Daily Progress Note   Patient Name: Ethan Vega.       Date: 08/13/2023 DOB: 1945/10/13  Age: 78 y.o. MRN#: [id number] Attending Physician: Althia Atlas, MD Primary Care Physician: Shari Daughters, MD Admit Date: 08/09/2023  Reason for Consultation/Follow-up: Establishing goals of care  Subjective: Notes reviewed.  In to see patient.  He is currently resting in bed at this time, no family at bedside.  He is awake and trying to speak to me.  He attempts to answer my questions but is unable to.    Daughter called PMT with questions.  Questions answered regarding oral dietary intake and medications.  Discussed that hospice and TOC would continue to assist with care planning moving forward.   Length of Stay: 3  Current Medications: Scheduled Meds:   antiseptic oral rinse  15 mL Topical BID   liver oil-zinc  oxide   Topical BID   scopolamine  1 patch Transdermal Q72H    Continuous Infusions:   PRN Meds: acetaminophen  **OR** acetaminophen , diphenhydrAMINE, glycopyrrolate **OR** glycopyrrolate **OR** glycopyrrolate, LORazepam, morphine  injection, polyvinyl alcohol  Physical Exam Pulmonary:     Effort: Pulmonary effort is normal.  Skin:    General: Skin is warm and dry.  Neurological:     Mental Status: He is alert.             Vital Signs: BP (!) 110/57 (BP Location: Left Arm)   Pulse 65   Temp 98.3 F (36.8 C)   Resp 20   SpO2 96%  SpO2: SpO2: 96 % O2 Device: O2 Device: Nasal Cannula O2 Flow Rate: O2 Flow Rate (L/min): 2 L/min  Intake/output summary:  Intake/Output Summary (Last 24 hours) at 08/13/2023 1222 Last data filed at 08/13/2023 0615 Gross per 24 hour  Intake --  Output 250 ml  Net -250 ml   LBM: Last BM Date : 08/10/23 Baseline Weight:   Most recent  weight:      Patient Active Problem List   Diagnosis Date Noted   Sepsis due to pneumonia (HCC) 08/10/2023   Sepsis (HCC) 08/09/2023   Pneumonia due to infectious organism 08/09/2023   Elevated troponin 08/09/2023   Aspiration pneumonia (HCC) 08/09/2023   Hypomagnesemia 08/09/2023   PUD (peptic ulcer disease) 07/30/2023   Gastrointestinal hemorrhage associated with anorectal source 07/29/2023  Melena 07/28/2023   Hyponatremia 07/18/2023   Class 1 obesity 07/17/2023   Failure to thrive in adult 07/17/2023   Anorexia 07/17/2023   Adrenal insufficiency (HCC) 07/17/2023   Acute renal failure superimposed on chronic kidney disease (HCC) 07/14/2023   Cellulitis of left foot 07/14/2023   Pressure injury of skin 07/14/2023   Toxic metabolic encephalopathy 07/14/2023   Septic shock (HCC) 07/10/2023   Mixed hyperlipidemia 10/09/2022   Acquired hypothyroidism 10/09/2022   NSVT (nonsustained ventricular tachycardia) (HCC) 02/16/2017   Paroxysmal atrial fibrillation (HCC) 03/28/2015   Leg swelling 07/27/2014   NICM (nonischemic cardiomyopathy) (HCC) 04/03/2014   HTN (hypertension) 04/03/2014   S/P implantation of automatic cardioverter/defibrillator (AICD) 04/03/2014   Prolonged Q-T interval on ECG 04/03/2014   Encounter for monitoring amiodarone  therapy 04/03/2014   Sinus node dysfunction/bradycardia 04/01/2014   Elevated transaminase level 04/01/2014   Cardiac arrest (HCC) 03/30/2014    Palliative Care Assessment & Plan    Recommendations/Plan: Continue comfort focused care. Hospice liaison and TOC working on dispo. No changes to symptom management recommended at this time.  Code Status:    Code Status Orders  (From admission, onward)           Start     Ordered   08/11/23 1532  Do not attempt resuscitation (DNR) - Comfort care  Continuous       Question Answer Comment  If patient has no pulse and is not breathing Do Not Attempt Resuscitation   In Pre-Arrest  Conditions (Patient Is Breathing and Has a Pulse) Provide comfort measures. Relieve any mechanical airway obstruction. Avoid transfer unless required for comfort.   Consent: Discussion documented in EHR or advanced directives reviewed      08/11/23 1535           Code Status History     Date Active Date Inactive Code Status Order ID Comments User Context   08/09/2023 1646 08/11/2023 1531 Limited: Do not attempt resuscitation (DNR) -DNR-LIMITED -Do Not Intubate/DNI  [id number]  Vita Grip, MD ED   07/10/2023 1856 08/01/2023 1648 Limited: Do not attempt resuscitation (DNR) -DNR-LIMITED -Do Not Intubate/DNI  [id number]  Delanna Fears, NP ED   07/10/2023 1341 07/10/2023 1856 Full Code [id number]  Erskin Hearing, MD ED   02/16/2017 1054 02/17/2017 1626 Full Code [id number]  Tammie Fall, MD ED   03/31/2014 1707 04/03/2014 1551 Full Code [id number]  Tammie Fall, MD Inpatient   03/30/2014 1652 03/31/2014 1707 Full Code [id number]  Roark Chick, PA-C Inpatient      Advance Directive Documentation    Flowsheet Row Most Recent Value  Type of Advance Directive Out of facility DNR (pink MOST or yellow form)  Pre-existing out of facility DNR order (yellow form or pink MOST form) --  "MOST" Form in Place? --      Thank you for allowing the Palliative Medicine Team to assist in the care of this patient.    Meribeth Standard, NP  Please contact Palliative Medicine Team phone at [phone] for questions and concerns.

## 2023-08-14 DIAGNOSIS — Z515 Encounter for palliative care: Secondary | ICD-10-CM | POA: Diagnosis not present

## 2023-08-14 DIAGNOSIS — A419 Sepsis, unspecified organism: Secondary | ICD-10-CM | POA: Diagnosis not present

## 2023-08-14 LAB — CULTURE, BLOOD (ROUTINE X 2)
Culture: NO GROWTH
Special Requests: ADEQUATE

## 2023-08-14 NOTE — Plan of Care (Signed)

## 2023-08-14 NOTE — Progress Notes (Addendum)
 Daily Progress Note   Patient Name: Ethan Vega.       Date: 08/14/2023 DOB: Mar 24, 1945  Age: 78 y.o. MRN#: [id number] Attending Physician: Althia Atlas, MD Primary Care Physician: Shari Daughters, MD Admit Date: 08/09/2023  Reason for Consultation/Follow-up: Terminal Care  Subjective: Notes reviewed.  In to see patient.  Today he is resting in bed with eyes closed, no family at bedside.  Respirations are even and unlabored; no distress noted.  His breakfast and lunch tray are both at bedside and are untouched.  Patient does not awaken to voice.    Hospice following for discharge planning.  No recommendations at this time for changes to symptom management.    Length of Stay: 4  Current Medications: Scheduled Meds:   antiseptic oral rinse  15 mL Topical BID   liver oil-zinc  oxide   Topical BID   scopolamine  1 patch Transdermal Q72H    Continuous Infusions:   PRN Meds: acetaminophen  **OR** acetaminophen , diphenhydrAMINE, glycopyrrolate **OR** glycopyrrolate **OR** glycopyrrolate, LORazepam, morphine  injection, polyvinyl alcohol  Physical Exam Constitutional:      Comments: Eyes closed  Pulmonary:     Effort: Pulmonary effort is normal.  Skin:    General: Skin is warm and dry.             Vital Signs: BP (!) 134/95   Pulse (!) 103   Temp 98.3 F (36.8 C)   Resp 19   Ht 5\' 3"  (1.6 m)   Wt 80.7 kg   SpO2 94%   BMI 31.52 kg/m  SpO2: SpO2: 94 % O2 Device: O2 Device: Nasal Cannula O2 Flow Rate: O2 Flow Rate (L/min): 2.5 L/min  Intake/output summary:  Intake/Output Summary (Last 24 hours) at 08/14/2023 1258 Last data filed at 08/14/2023 0900 Gross per 24 hour  Intake --  Output 900 ml  Net -900 ml   LBM: Last BM Date : 08/10/23 Baseline Weight: Weight:  80.7 kg Most recent weight: Weight: 80.7 kg   Patient Active Problem List   Diagnosis Date Noted   Sepsis due to pneumonia (HCC) 08/10/2023   Sepsis (HCC) 08/09/2023   Pneumonia due to infectious organism 08/09/2023   Elevated troponin 08/09/2023   Aspiration pneumonia (HCC) 08/09/2023   Hypomagnesemia 08/09/2023   PUD (peptic ulcer disease) 07/30/2023   Gastrointestinal  hemorrhage associated with anorectal source 07/29/2023   Melena 07/28/2023   Hyponatremia 07/18/2023   Class 1 obesity 07/17/2023   Failure to thrive in adult 07/17/2023   Anorexia 07/17/2023   Adrenal insufficiency (HCC) 07/17/2023   Acute renal failure superimposed on chronic kidney disease (HCC) 07/14/2023   Cellulitis of left foot 07/14/2023   Pressure injury of skin 07/14/2023   Toxic metabolic encephalopathy 07/14/2023   Septic shock (HCC) 07/10/2023   Mixed hyperlipidemia 10/09/2022   Acquired hypothyroidism 10/09/2022   NSVT (nonsustained ventricular tachycardia) (HCC) 02/16/2017   Paroxysmal atrial fibrillation (HCC) 03/28/2015   Leg swelling 07/27/2014   NICM (nonischemic cardiomyopathy) (HCC) 04/03/2014   HTN (hypertension) 04/03/2014   S/P implantation of automatic cardioverter/defibrillator (AICD) 04/03/2014   Prolonged Q-T interval on ECG 04/03/2014   Encounter for monitoring amiodarone  therapy 04/03/2014   Sinus node dysfunction/bradycardia 04/01/2014   Elevated transaminase level 04/01/2014   Cardiac arrest (HCC) 03/30/2014    Palliative Care Assessment & Plan   Recommendations/Plan: Hospice following for discharge planning. No changes recommended to symptom management at this time.  Code Status:    Code Status Orders  (From admission, onward)           Start     Ordered   08/11/23 1532  Do not attempt resuscitation (DNR) - Comfort care  Continuous       Question Answer Comment  If patient has no pulse and is not breathing Do Not Attempt Resuscitation   In Pre-Arrest  Conditions (Patient Is Breathing and Has a Pulse) Provide comfort measures. Relieve any mechanical airway obstruction. Avoid transfer unless required for comfort.   Consent: Discussion documented in EHR or advanced directives reviewed      08/11/23 1535           Code Status History     Date Active Date Inactive Code Status Order ID Comments User Context   08/09/2023 1646 08/11/2023 1531 Limited: Do not attempt resuscitation (DNR) -DNR-LIMITED -Do Not Intubate/DNI  [id number]  Vita Grip, MD ED   07/10/2023 1856 08/01/2023 1648 Limited: Do not attempt resuscitation (DNR) -DNR-LIMITED -Do Not Intubate/DNI  [id number]  Delanna Fears, NP ED   07/10/2023 1341 07/10/2023 1856 Full Code [id number]  Erskin Hearing, MD ED   02/16/2017 1054 02/17/2017 1626 Full Code [id number]  Tammie Fall, MD ED   03/31/2014 1707 04/03/2014 1551 Full Code [id number]  Tammie Fall, MD Inpatient   03/30/2014 1652 03/31/2014 1707 Full Code [id number]  Roark Chick, PA-C Inpatient      Advance Directive Documentation    Flowsheet Row Most Recent Value  Type of Advance Directive Out of facility DNR (pink MOST or yellow form)  Pre-existing out of facility DNR order (yellow form or pink MOST form) --  "MOST" Form in Place? --       Thank you for allowing the Palliative Medicine Team to assist in the care of this patient.  Meribeth Standard, NP  Please contact Palliative Medicine Team phone at [phone] for questions and concerns.

## 2023-08-14 NOTE — Progress Notes (Cosign Needed)
 Pt remains on comfort care. He has spent most of the day sleeping, resting comfortably with some stomach breathing and occasional gasping. He is wearing a nasal cannula with 2.5 L of oxygen flowing. Morphine  and ativan  have been given as needed to help with his breathing difficulties. Head of bed is elevated and call bell is within reach.

## 2023-08-14 NOTE — Progress Notes (Signed)
 [facility] Progress Note  Patient: Ethan Vega.    UYQ:[id number]  DOA: 08/09/2023     Date of Service: the patient was seen and examined on 08/14/2023  No chief complaint on file.  Brief hospital course: Burrel Legrand. is a 78 y.o. male with medical history significant for dementia, basal cell carcinoma, asthma, HTN, HLD nonischemic cardiomyopathy s/p AICD, PAF, prolonged QTc, paroxysmal A-fib who presented to the ED for evaluation of shortness of breath and cough.  Patient had a 22-day hospitalization from 4/24 - 5/16 due to septic shock complicated by GI bleed with discontinuation of his Eliquis  at discharge to rehab facility. Patient reports that a few days ago, the nurse assisting with his medication shoved 7-8 pills down his throat and he felt like "I was choking to death". Since then, he has had persistent congested cough that is occasionally productive. He had associated shortness of breath at the facility and CXR was done that showed left lower lobe consolidations.  EMS was called and patient was found with O2 sats in the low 90s. Per EMS, patient also believed to have aspirated on his lunch.     ED Course: Tmax 98.7, RR 21, HR 110, BP 108/61, SpO2 95% on RA. Labs: Na 141, K+ 4.1, sCr 1.20, albumin 2.9, bili 1.6, WBC 15.0, Hgb 10.9, platelet 268, troponin 363->445, lactic acid 3.2->3.0, BNP 3948, negative COVID, flu and RSV test.   Initial EKG shows A-fib with PVCs and RVH.  CXR shows cardiomegaly with vascular congestion and trace edema.  Patient received IV Rocephin  and azithromycin as well as 300 cc of IV LR 1 L bolus. TRH was consulted for admission.  5/26 patient was transition to comfort measures only.  Care is following for symptom management.  5/27 patient will benefit from transfer to inpatient hospice if symptoms are well-controlled.  Discussed with patient's daughter, informed to [facility] and hospice care. 5/28 as per hospice team patient's dtr has not decided yet  for placement   Assessment and Plan:  # Comfort measures only Continue comfort care medications as per palliative care management Continue supplemental O2 inhalation. Continue fall precautions    During hospital stay patient was managed as below  # Sepsis # Aspiration pneumonia - Presented from rehab facility for progressive congested cough and shortness of breath - CXR at the facility showed evidence of consolidation - Met sepsis criteria with tachycardia, tachypnea, leukocytosis, lactic acidosis and respiratory infection. - s/p IV Rocephin  and doxycycline, s/p Zosyn  to cover anaerobes S/p Mucinex  600 mg BID and Tussionex prn - Incentive spirometer, flutter valve with help  - Lactic acid trended down, s/p IV hydration  - sputum culture growing Staph aureus and blood culture NGTD, MRSA nares negative 5/26 started glycopyrrolate IV and scopolamine patch due to excessive secretions, RN was advised to call RT for deep suctioning PCCM was consulted, discussed with family, they do not want to proceed with intubation, so we will continue current symptomatic treatment and palliative care consulted for goals of care discussion, patient may need hospice care.    # Dysphagia, SLP eval consulted Continue deep suction as needed RT consulted Started atropine sublingual drops to decrease oral secretions, patient is not able to swallow his own secretions and at high risk for aspiration May need a scopolamine patch, we will continue to monitor Continue PPI 40 mg IV twice daily transition to oral when able to swallow Continue aspiration precautions Keep n.p.o. for now  # Elevated troponin #  NICM # Hx of VF arrest s/p AICD # Hx of NSVT - Presented with congested cough with minimal SOB but no chest pain - BNP significantly elevated with CXR showing vascular congestion - Patient with bibasilar crackles but no JVD or lower extremity edema - Per daughter, patient given home Lasix  at the  facility prior to arrival to the ED - After admission, patient found to have intermittent V. tach's on telemetry lasting about 3 minutes each - Troponins elevated to 363->445, patient remains chest pain-free, EKG without ischemic changes - Elevated troponin likely secondary to demand ischemia in the setting of sepsis and recurrent V. tach - Plan was to consider cardiology consult if persistent arrhythmia     # Paroxysmal A-fib - Initial EKG showed A-fib rhythm - HR in the 100s-110s - Eliquis  discontinued during last hospitalization due to GI bleed   # Chronic hypotension - BP slightly soft with SBP in the 100s to 110s -s/p midodrine    # Hypomagnesemia - Magnesium down to 1.6, repleted with IV mag 4 g x 1 Mag 2.7 improved   # Dementia - Alert and oriented x 3 and has poor memory - s/p Aricept , Seroquel  and mirtazapine    # Synthroid : Last TSH 6.18 a month ago. TSH 1.3, free T41.49 --1.38 Slightly elevated. Patient was on Synthroid  75 mcg, which was held due to elevated free T4 level.  # Generalized weakness - In the setting of acute illness and recent hospitalization   # HLD: S/p atorvastatin    # GERD: s/p Protonix   # Iron deficiency anemia, transferrin saturation 3%, avoided IV iron due to active infection. S/p oral iron.  B12 and folate within normal range  Body mass index is 31.52 kg/m.  Interventions:  Pressure Injury 08/10/23 Buttocks Right;Lateral Stage 1 -  Intact skin with non-blanchable redness of a localized area usually over a bony prominence. (Active)  08/10/23 1030  Location: Buttocks  Location Orientation: Right;Lateral  Staging: Stage 1 -  Intact skin with non-blanchable redness of a localized area usually over a bony prominence.  Wound Description (Comments):   Present on Admission: No  Dressing Type Foam - Lift dressing to assess site every shift 08/14/23 0751     Diet: NPO DVT Prophylaxis: Comfort measures only  Advance goals of care discussion:  DNR-comfort  Family Communication: family was present at bedside, at the time of interview.  5/27 d/w patient's daughter over the phone  Disposition:  Pt is from SNF, admitted with Aspiration PNA, transition to comfort measures only on 5/26.   5/29 d/w patient's daughter and as per her she does not want him to be moved to inpatient hospice facility because she will be charged $350 per night.  So she would rather keep him here for symptom management. Follow TOC, palliative care and hospice team.   Subjective: No significant events overnight, patient was awake, stated that he is having some cough, no pain.  Resting comfortably.  Unable to offer any other complaints.   Physical Exam: General: Mild respiratory distress due to secretions.   Eyes: PERRLA   ENT: Oral Mucosa Clear, moist  Neck: no JVD,  Cardiovascular: S1 and S2 Present, no Murmur,  Respiratory: Equal air entry bilaterally, conducting sounds due to secretions.   Abdomen: Bowel Sound present, Soft and no tenderness,  Extremities: no Pedal edema, no calf tenderness Neurologic: Metabolic encephalopathy, no focal deficits   Vitals:   08/13/23 1315 08/13/23 1954 08/14/23 0322 08/14/23 0748  BP:  112/89 106/67 (!) 134/95  Pulse:  (!) 53 86 (!) 103  Resp:  15 16 19   Temp:  98.3 F (36.8 C) 98.4 F (36.9 C) 98.3 F (36.8 C)  TempSrc:  Oral Oral   SpO2:  97% 95% 94%  Weight: 80.7 kg     Height:        Intake/Output Summary (Last 24 hours) at 08/14/2023 1206 Last data filed at 08/14/2023 0900 Gross per 24 hour  Intake --  Output 900 ml  Net -900 ml   Filed Weights   08/13/23 1315  Weight: 80.7 kg    Data Reviewed: I have personally reviewed and interpreted daily labs, tele strips, imagings as discussed above. I reviewed all nursing notes, pharmacy notes, vitals, pertinent old records I have discussed plan of care as described above with RN and patient/family.  CBC: Recent Labs  Lab 08/09/23 1511  08/10/23 0531 08/11/23 0530  WBC 15.0* 12.5* 7.3  NEUTROABS 13.4*  --   --   HGB 10.9* 10.7* 9.5*  HCT 36.1* 34.3* 30.6*  MCV 103.1* 100.0 99.0  PLT 268 252 238   Basic Metabolic Panel: Recent Labs  Lab 08/09/23 1511 08/09/23 2028 08/10/23 0531 08/11/23 0530  NA 141  --  141 141  K 4.1  --  4.0 4.4  CL 111  --  106 107  CO2 17*  --  24 26  GLUCOSE 128*  --  103* 100*  BUN 18  --  19 25*  CREATININE 1.20  --  1.27* 1.40*  CALCIUM  8.5*  --  8.5* 8.2*  MG  --  1.6* 2.7* 2.3  PHOS  --   --  3.3 3.4    Studies: No results found.   Scheduled Meds:  antiseptic oral rinse  15 mL Topical BID   liver oil-zinc  oxide   Topical BID   scopolamine  1 patch Transdermal Q72H   Continuous Infusions:   PRN Meds: acetaminophen  **OR** acetaminophen , diphenhydrAMINE, glycopyrrolate **OR** glycopyrrolate **OR** glycopyrrolate, LORazepam, morphine  injection, polyvinyl alcohol  Time spent: 25 minutes  Author: Althia Atlas. MD [facility] 08/14/2023 12:06 PM  To reach On-call, see care teams to locate the attending and reach out to them via [email or web address]. If 7PM-7AM, please contact night-coverage If you still have difficulty reaching the attending provider, please page the [facility] (Director on Call) for [facility] on amion for assistance.

## 2023-08-15 DIAGNOSIS — A419 Sepsis, unspecified organism: Secondary | ICD-10-CM | POA: Diagnosis not present

## 2023-08-15 DIAGNOSIS — Z515 Encounter for palliative care: Secondary | ICD-10-CM | POA: Diagnosis not present

## 2023-08-15 DIAGNOSIS — J690 Pneumonitis due to inhalation of food and vomit: Secondary | ICD-10-CM | POA: Diagnosis not present

## 2023-08-15 DIAGNOSIS — R627 Adult failure to thrive: Secondary | ICD-10-CM | POA: Diagnosis not present

## 2023-08-15 LAB — GLUCOSE, CAPILLARY: Glucose-Capillary: 73 mg/dL (ref 70–99)

## 2023-08-15 NOTE — Plan of Care (Signed)
 Patient alert & oriented to self. Comfort care, pain and agitation successfully treated with MAR medications. Patient offered meals refused. Was provided hydration via syringe not strong enough to pull up fluids through straws. Bathed and malewick replaced in bed. Currently sleeping comfortably.   Problem: Education: Goal: Knowledge of General Education information will improve Description: Including pain rating scale, medication(s)/side effects and non-pharmacologic comfort measures Outcome: Not Met (add Reason)   Problem: Health Behavior/Discharge Planning: Goal: Ability to manage health-related needs will improve Outcome: Not Met (add Reason)   Problem: Clinical Measurements: Goal: Ability to maintain clinical measurements within normal limits will improve Outcome: Not Met (add Reason) Goal: Will remain free from infection Outcome: Not Met (add Reason) Goal: Diagnostic test results will improve Outcome: Not Met (add Reason) Goal: Respiratory complications will improve Outcome: Not Met (add Reason) Goal: Cardiovascular complication will be avoided Outcome: Not Met (add Reason)   Problem: Activity: Goal: Risk for activity intolerance will decrease Outcome: Not Met (add Reason)   Problem: Nutrition: Goal: Adequate nutrition will be maintained Outcome: Not Met (add Reason)   Problem: Coping: Goal: Level of anxiety will decrease Outcome: Not Met (add Reason)   Problem: Elimination: Goal: Will not experience complications related to bowel motility Outcome: Not Met (add Reason)   Problem: Safety: Goal: Ability to remain free from injury will improve Outcome: Not Met (add Reason)   Problem: Skin Integrity: Goal: Risk for impaired skin integrity will decrease Outcome: Not Met (add Reason)

## 2023-08-15 NOTE — Progress Notes (Signed)
 Nutrition Brief Note  Chart reviewed. Pt now transitioning to comfort care.  No further nutrition interventions planned at this time.  Please re-consult as needed.   Levada Schilling, RD, LDN, CDCES Registered Dietitian III Certified Diabetes Care and Education Specialist If unable to reach this RD, please use "RD Inpatient" group chat on secure chat between hours of 8am-4 pm daily

## 2023-08-15 NOTE — Progress Notes (Signed)
 [facility]  - [place] at [facility]   PATIENT NAME: Ethan Vega    MR#:  [id number]  DATE OF BIRTH:  1946/01/20  SUBJECTIVE:  patient laying in bed. Unable to hold meaningful conversation. Not eating. No family at bedside currently on comfort care  VITALS:  Blood pressure (!) 131/99, pulse (!) 56, temperature 98.8 F (37.1 C), temperature source Oral, resp. rate (!) 22, height 5\' 3"  (1.6 m), weight 80.7 kg, SpO2 97%.  PHYSICAL EXAMINATION:  limited GENERAL:  78 y.o.-year-old patient with no acute distress.  LUNGS: Normal breath sounds bilaterally, no wheezing CARDIOVASCULAR: S1, S2 normal. No murmur   NEUROLOGIC: sleeping   LABORATORY PANEL:  CBC Recent Labs  Lab 08/11/23 0530  WBC 7.3  HGB 9.5*  HCT 30.6*  PLT 238    Chemistries  Recent Labs  Lab 08/11/23 0530  NA 141  K 4.4  CL 107  CO2 26  GLUCOSE 100*  BUN 25*  CREATININE 1.40*  CALCIUM  8.2*  MG 2.3  AST 21  ALT 26  ALKPHOS 56  BILITOT 1.5*    Assessment and Plan Channing Yeager. is a 78 y.o. male with medical history significant for dementia, basal cell carcinoma, asthma, HTN, HLD nonischemic cardiomyopathy s/p AICD, PAF, prolonged QTc, paroxysmal A-fib who presented to the ED for evaluation of shortness of breath and cough.  Patient had a 22-day hospitalization from 4/24 - 5/16 due to septic shock complicated by GI bleed with discontinuation of his Eliquis  at discharge to rehab facility.  CXR shows cardiomegaly with vascular congestion and trace edema   # Comfort measures only Continue comfort care medications as per palliative care management Food for pleasure   # Sepsis # Aspiration pneumonia - Presented from rehab facility for progressive congested cough and shortness of breath - CXR at the facility showed evidence of consolidation    # Dysphagia  # Elevated troponin # NICM # Hx of VF arrest s/p AICD # Hx of NSVT  # Paroxysmal A-fib  # Chronic hypotension  #  Dementia  # Generalized weakness/Failure to thrive  # HLD  # GERD  # Iron deficiency anemia      Pressure Injury 08/10/23 Buttocks Right;Lateral Stage 1 -  Intact skin with non-blanchable redness of a localized area usually over a bony prominence. (Active)  08/10/23 1030  Location: Buttocks  Location Orientation: Right;Lateral  Staging: Stage 1 -  Intact skin with non-blanchable redness of a localized area usually over a bony prominence.  Wound Description (Comments):   Present on Admission: No  Dressing Type Foam - Lift dressing to assess site every shift 08/14/23  TOC for d/c planning--family not able to afford Copay for hospice  Procedures: Family communication :none today CODE STATUS: DNR/DNI    TOTAL TIME TAKING CARE OF THIS PATIENT: 25 minutes.  >50% time spent on counselling and coordination of care  Note: This dictation was prepared with Dragon dictation along with smaller phrase technology. Any transcriptional errors that result from this process are unintentional.  Melvinia Stager M.D    [facility]   CC: Primary care physician; Shari Daughters, MD

## 2023-08-15 NOTE — TOC Progression Note (Addendum)
 Transition of Care (TOC) - Progression Note    Patient Details  Name: Ethan Vega. MRN: [id number] Date of Birth: 1945/07/12  Transition of Care [facility]) CM/SW Contact  Elsie Halo, RN Phone Number: 08/15/2023, 10:57 AM  Clinical Narrative:    TOC spoke with the patient's daughter Ethan Vega [phone] . Prior to this admission the patient lived at home alone. Pantano lives locally, but his other daughter lives in [place]. Ideally she would like for her dad to admit to an IP Hospice unit. However, per Ethan Vega, there is a $350 a day copay for inpt hospice. She can not commit to inpatient hospice at this time. [place] would like assistance with Medicaid application for LTC. TOC sent referral to Ethan Vega in finance.   TOC will continue to follow for dc planning.        Expected Discharge Plan and Services                                               Social Determinants of Health (SDOH) Interventions SDOH Screenings   Food Insecurity: Patient Unable To Answer (08/09/2023)  Housing: Patient Unable To Answer (08/09/2023)  Transportation Needs: Patient Unable To Answer (08/09/2023)  Utilities: Patient Unable To Answer (08/09/2023)  Depression (PHQ2-9): Low Risk  (01/15/2023)  Social Connections: Patient Unable To Answer (08/09/2023)  Tobacco Use: Low Risk  (08/09/2023)    Readmission Risk Interventions     No data to display

## 2023-08-15 NOTE — Progress Notes (Signed)
 Daily Progress Note   Patient Name: Ethan Vega.       Date: 08/15/2023 DOB: 03/13/1946  Age: 78 y.o. MRN#: [id number] Attending Physician: Melvinia Stager, MD Primary Care Physician: Shari Daughters, MD Admit Date: 08/09/2023  Reason for Consultation/Follow-up: Establishing goals of care  Subjective: Notes reviewed.  In to see patient.  He is currently resting in bed, no family at bedside.  He is resting in bed with eyes closed, even unlabored respirations.  No distress noted at this time.  Nursing is at bedside.  She states he is only drinking small amounts of juice from a syringe, but has had nothing more.  Discussed symptom management.  No changes recommended to symptom management regimen at this time.  He did awaken at the end of my visit but was unable to answer questions. Nursing working with patient upon my departure.              Length of Stay: 5  Current Medications: Scheduled Meds:   antiseptic oral rinse  15 mL Topical BID   liver oil-zinc  oxide   Topical BID   scopolamine   1 patch Transdermal Q72H    Continuous Infusions:   PRN Meds: acetaminophen  **OR** acetaminophen , diphenhydrAMINE , glycopyrrolate  **OR** glycopyrrolate  **OR** glycopyrrolate , LORazepam , morphine  injection, polyvinyl alcohol   Physical Exam Pulmonary:     Effort: Pulmonary effort is normal.  Skin:    General: Skin is warm and dry.  Neurological:     Mental Status: He is alert.     Comments: Confused             Vital Signs: BP (!) 131/99 (BP Location: Left Arm)   Pulse (!) 56   Temp 98.8 F (37.1 C) (Oral)   Resp (!) 22   Ht 5\' 3"  (1.6 m)   Wt 80.7 kg   SpO2 97%   BMI 31.52 kg/m  SpO2: SpO2: 97 % O2 Device: O2 Device: Nasal Cannula O2 Flow Rate: O2 Flow Rate (L/min): 2.5  L/min  Intake/output summary:  Intake/Output Summary (Last 24 hours) at 08/15/2023 1408 Last data filed at 08/15/2023 1407 Gross per 24 hour  Intake 0 ml  Output --  Net 0 ml   LBM: Last BM Date : 08/10/23 Baseline Weight: Weight: 80.7 kg Most recent weight: Weight: 80.7 kg  Patient Active Problem List   Diagnosis Date Noted   Sepsis due to pneumonia (HCC) 08/10/2023   Sepsis (HCC) 08/09/2023   Pneumonia due to infectious organism 08/09/2023   Elevated troponin 08/09/2023   Aspiration pneumonia (HCC) 08/09/2023   Hypomagnesemia 08/09/2023   PUD (peptic ulcer disease) 07/30/2023   Gastrointestinal hemorrhage associated with anorectal source 07/29/2023   Melena 07/28/2023   Hyponatremia 07/18/2023   Class 1 obesity 07/17/2023   Failure to thrive in adult 07/17/2023   Anorexia 07/17/2023   Adrenal insufficiency (HCC) 07/17/2023   Acute renal failure superimposed on chronic kidney disease (HCC) 07/14/2023   Cellulitis of left foot 07/14/2023   Pressure injury of skin 07/14/2023   Toxic metabolic encephalopathy 07/14/2023   Septic shock (HCC) 07/10/2023   Mixed hyperlipidemia 10/09/2022   Acquired hypothyroidism 10/09/2022   NSVT (nonsustained ventricular tachycardia) (HCC) 02/16/2017   Paroxysmal atrial fibrillation (HCC) 03/28/2015   Leg swelling 07/27/2014   NICM (nonischemic cardiomyopathy) (HCC) 04/03/2014   HTN (hypertension) 04/03/2014   S/P implantation of automatic cardioverter/defibrillator (AICD) 04/03/2014   Prolonged Q-T interval on ECG 04/03/2014   Encounter for monitoring amiodarone  therapy 04/03/2014   Sinus node dysfunction/bradycardia 04/01/2014   Elevated transaminase level 04/01/2014   Cardiac arrest (HCC) 03/30/2014    Palliative Care Assessment & Plan    Recommendations/Plan: Patient is currently on comfort care and waiting for discharge. No changes recommended to symptom management at this time. PMT will shadow for needs.  Please reach out to  weekend coverage for immediate needs.  Code Status:    Code Status Orders  (From admission, onward)           Start     Ordered   08/11/23 1532  Do not attempt resuscitation (DNR) - Comfort care  Continuous       Question Answer Comment  If patient has no pulse and is not breathing Do Not Attempt Resuscitation   In Pre-Arrest Conditions (Patient Is Breathing and Has a Pulse) Provide comfort measures. Relieve any mechanical airway obstruction. Avoid transfer unless required for comfort.   Consent: Discussion documented in EHR or advanced directives reviewed      08/11/23 1535           Code Status History     Date Active Date Inactive Code Status Order ID Comments User Context   08/09/2023 1646 08/11/2023 1531 Limited: Do not attempt resuscitation (DNR) -DNR-LIMITED -Do Not Intubate/DNI  [id number]  Vita Grip, MD ED   07/10/2023 1856 08/01/2023 1648 Limited: Do not attempt resuscitation (DNR) -DNR-LIMITED -Do Not Intubate/DNI  [id number]  Delanna Fears, NP ED   07/10/2023 1341 07/10/2023 1856 Full Code [id number]  Erskin Hearing, MD ED   02/16/2017 1054 02/17/2017 1626 Full Code [id number]  Tammie Fall, MD ED   03/31/2014 1707 04/03/2014 1551 Full Code [id number]  Tammie Fall, MD Inpatient   03/30/2014 1652 03/31/2014 1707 Full Code [id number]  Roark Chick, PA-C Inpatient      Advance Directive Documentation    Flowsheet Row Most Recent Value  Type of Advance Directive Out of facility DNR (pink MOST or yellow form)  Pre-existing out of facility DNR order (yellow form or pink MOST form) --  "MOST" Form in Place? --       Prognosis:  < 2 weeks    Thank you for allowing the Palliative Medicine Team to assist in the care of this patient.    Meribeth Standard, NP  Please contact Palliative Medicine  Team phone at [phone] for questions and concerns.

## 2023-08-16 DIAGNOSIS — J690 Pneumonitis due to inhalation of food and vomit: Secondary | ICD-10-CM | POA: Diagnosis not present

## 2023-08-16 DIAGNOSIS — R627 Adult failure to thrive: Secondary | ICD-10-CM | POA: Diagnosis not present

## 2023-08-16 DIAGNOSIS — R7989 Other specified abnormal findings of blood chemistry: Secondary | ICD-10-CM | POA: Diagnosis not present

## 2023-08-16 DIAGNOSIS — J189 Pneumonia, unspecified organism: Secondary | ICD-10-CM | POA: Diagnosis not present

## 2023-08-16 DIAGNOSIS — Z515 Encounter for palliative care: Secondary | ICD-10-CM | POA: Diagnosis not present

## 2023-08-16 DIAGNOSIS — A419 Sepsis, unspecified organism: Secondary | ICD-10-CM | POA: Diagnosis not present

## 2023-08-16 NOTE — Progress Notes (Signed)
 Palliative Care Progress Note, Assessment & Plan   Patient Name: Ethan Vega.       Date: 08/16/2023 DOB: 1946/03/02  Age: 78 y.o. MRN#: [id number] Attending Physician: Melvinia Stager, MD Primary Care Physician: Shari Daughters, MD Admit Date: 08/09/2023  Subjective: Unable to assess  HPI: 78 y.o. male  with past medical history significant for dementia, basal cell carcinoma, asthma, HTN, HLD nonischemic cardiomyopathy s/p AICD, PAF, prolonged QTc and paroxysmal A-fib. He presented from facility via EMS 08/09/23 with c/o shortness of breath and cough. EMS reported on scene, patient had O2 sat in low 90s. Facility staff reported to EMS suspicion of aspirating while eating lunch. Since arriving at facility, he has had persistent, occasionally productive cough. CXR at facility showed LLL consolidations.   On arrival to ED, BP 108/61, HR 110, O2 95% RA, RR 21, temp 98.7. ED labs showed Na+ 141, K+ 4.1, Creatinine 1.20, WBC 15.0, Hgb 10.9, albumin 2.9, troponin 363-> 445, lactic 3.2-> 3.0, BNP 3948. Covid/flu/RSV negative. CXR showed cardiomegaly with vascular congestion and trace edema.  He was admitted for further evaluation and management of sepsis, aspiration PNA and elevated troponin.    Of note, he recently had 22 day hospitalization from 4/24-5/16/25 for septic shock, AKI and GIB with discontinuation of Eliquis . He was subsequently transferred to [facility] for rehabilitation.     PMT was consulted for goals of care conversation. Ethan Vega is well-known to PMT from previous admission  with numerous goals of care conversations with the patient and his daughter, [place].   5/26: Patient was transitioned to comfort care.   Summary of counseling/coordination of care: Extensive chart review completed  prior to meeting patient including labs, vital signs, imaging, progress notes, orders, and available advanced directive documents from current and previous encounters.   After reviewing the patient's chart, I assessed patient at bedside.   Ill-appearing, elderly male resting in bed. He awakens to verbal stimuli but immediately falls back to sleep. Weak, gurgling cough noted. He is in no distress. No family at bedside. Primary RN at bedside assessing patient.   Per TOC note, patient daughter requesting assistance with Medicaid application for LTC facility. Daughter does not want patient transferred to IP hospice home due to $350/day copay.  PMT will continue to follow for symptom management needs and plan for d/c.   Physical Exam Vitals reviewed.  Constitutional:      General: He is not in acute distress.    Appearance: He is ill-appearing.  HENT:     Head: Normocephalic.  Pulmonary:     Effort: Pulmonary effort is normal.  Skin:    General: Skin is dry.     Comments: Cool to touch    Recommendations/Plan:         Focus of care is comfort and dignity allowing for natural death Utilize ordered medications to maintain comfort Palliative to follow for symptom management  TOC assisting with d/c planning         Total Time 35 minutes   Discussed plan of care with primary RN and ACC liaison.   Time spent includes: Detailed review of medical records (labs, imaging, vital signs), medically appropriate exam (mental  status, respiratory, cardiac, skin), discussed with treatment team, counseling and educating patient, family and staff, documenting clinical information, medication management and coordination of care.     Ina Manas, Joyice Nodal- [facility] Palliative Medicine Team  08/16/2023 9:12 AM  Office [phone]  Pager [phone]

## 2023-08-16 NOTE — Progress Notes (Signed)
 [facility]  - [place] at [facility]   PATIENT NAME: Ethan Vega    MR#:  [id number]  DATE OF BIRTH:  08-04-1945  SUBJECTIVE:  patient laying in bed. Not eating. Drank some fluids per staff No family at bedside currently on comfort care  VITALS:  Blood pressure 101/63, pulse 96, temperature (!) 97.4 F (36.3 C), temperature source Oral, resp. rate 10, height 5\' 3"  (1.6 m), weight 80.7 kg, SpO2 99%.  PHYSICAL EXAMINATION:  limited GENERAL:  78 y.o.-year-old patient with no acute distress.  LUNGS: Normal breath sounds bilaterally, no wheezing CARDIOVASCULAR: S1, S2 normal. No murmur   NEUROLOGIC: sleeping   LABORATORY PANEL:  CBC Recent Labs  Lab 08/11/23 0530  WBC 7.3  HGB 9.5*  HCT 30.6*  PLT 238    Chemistries  Recent Labs  Lab 08/11/23 0530  NA 141  K 4.4  CL 107  CO2 26  GLUCOSE 100*  BUN 25*  CREATININE 1.40*  CALCIUM  8.2*  MG 2.3  AST 21  ALT 26  ALKPHOS 56  BILITOT 1.5*    Assessment and Plan Jp Eas[place]. is a 78 y.o. male with medical history significant for dementia, basal cell carcinoma, asthma, HTN, HLD nonischemic cardiomyopathy s/p AICD, PAF, prolonged QTc, paroxysmal A-fib who presented to the ED for evaluation of shortness of breath and cough.  Patient had a 22-day hospitalization from 4/24 - 5/16 due to septic shock complicated by GI bleed with discontinuation of his Eliquis  at discharge to rehab facility.  CXR shows cardiomegaly with vascular congestion and trace edema   # Comfort measures only Continue comfort care medications as per palliative care management Food for pleasure   # Sepsis # Aspiration pneumonia - Presented from rehab facility for progressive congested cough and shortness of breath - CXR at the facility showed evidence of consolidation    # Dysphagia  # Elevated troponin # NICM # Hx of VF arrest s/p AICD # Hx of NSVT  # Paroxysmal A-fib  # Chronic hypotension  # Dementia  # Generalized  weakness/Failure to thrive  # HLD  # GERD  # Iron deficiency anemia      Pressure Injury 08/10/23 Buttocks Right;Lateral Stage 1 -  Intact skin with non-blanchable redness of a localized area usually over a bony prominence. (Active)  08/10/23 1030  Location: Buttocks  Location Orientation: Right;Lateral  Staging: Stage 1 -  Intact skin with non-blanchable redness of a localized area usually over a bony prominence.  Wound Description (Comments):   Present on Admission: No  Dressing Type Foam - Lift dressing to assess site every shift 08/14/23  TOC for d/c planning--family not able to afford Cop[employer] for hospice   Family communication :none today CODE STATUS: DNR/DNI   TOC for d/c planning  TOTAL TIME TAKING CARE OF THIS PATIENT: 25 minutes.  >50% time spent on counselling and coordination of care  Note: This dictation was prepared with Dragon dictation along with smaller phrase technology. Any transcriptional errors that result from this process are unintentional.  Melvinia Stager M.D    [facility]   CC: Primary care physician; Shari Daughters, MD

## 2023-08-16 NOTE — Plan of Care (Signed)
 Patient given morphine  1x so far on this shift for pain. Otherwise resting comfortably.  Problem: Education: Goal: Knowledge of General Education information will improve Description: Including pain rating scale, medication(s)/side effects and non-pharmacologic comfort measures Outcome: Progressing   Problem: Health Behavior/Discharge Planning: Goal: Ability to manage health-related needs will improve Outcome: Progressing   Problem: Clinical Measurements: Goal: Ability to maintain clinical measurements within normal limits will improve Outcome: Progressing Goal: Will remain free from infection Outcome: Progressing Goal: Diagnostic test results will improve Outcome: Progressing Goal: Respiratory complications will improve Outcome: Progressing Goal: Cardiovascular complication will be avoided Outcome: Progressing   Problem: Activity: Goal: Risk for activity intolerance will decrease Outcome: Progressing   Problem: Nutrition: Goal: Adequate nutrition will be maintained Outcome: Progressing   Problem: Coping: Goal: Level of anxiety will decrease Outcome: Progressing   Problem: Elimination: Goal: Will not experience complications related to bowel motility Outcome: Progressing Goal: Will not experience complications related to urinary retention Outcome: Progressing   Problem: Pain Managment: Goal: General experience of comfort will improve and/or be controlled Outcome: Progressing   Problem: Safety: Goal: Ability to remain free from injury will improve Outcome: Progressing   Problem: Skin Integrity: Goal: Risk for impaired skin integrity will decrease Outcome: Progressing   Problem: Education: Goal: Knowledge of the prescribed therapeutic regimen will improve Outcome: Progressing   Problem: Coping: Goal: Ability to identify and develop effective coping behavior will improve Outcome: Progressing   Problem: Clinical Measurements: Goal: Quality of life will  improve Outcome: Progressing   Problem: Respiratory: Goal: Verbalizations of increased ease of respirations will increase Outcome: Progressing   Problem: Role Relationship: Goal: Family's ability to cope with current situation will improve Outcome: Progressing Goal: Ability to verbalize concerns, feelings, and thoughts to partner or family member will improve Outcome: Progressing   Problem: Pain Management: Goal: Satisfaction with pain management regimen will improve Outcome: Progressing

## 2023-08-17 DIAGNOSIS — A419 Sepsis, unspecified organism: Secondary | ICD-10-CM | POA: Diagnosis not present

## 2023-08-17 DIAGNOSIS — J189 Pneumonia, unspecified organism: Secondary | ICD-10-CM | POA: Diagnosis not present

## 2023-08-17 MED ORDER — GLYCOPYRROLATE 0.2 MG/ML IJ SOLN: 0.4000 mg | INTRAMUSCULAR | Status: DC | PRN

## 2023-08-17 MED ORDER — GLYCOPYRROLATE 1 MG PO TABS
1.0000 mg | ORAL_TABLET | ORAL | Status: DC | PRN
  Filled 2023-08-17: qty 1

## 2023-08-17 MED ORDER — MORPHINE 100MG IN NS 100ML (1MG/ML) PREMIX INFUSION
1.0000 mg/h | INTRAVENOUS | Status: DC
  Administered 2023-08-17: 1 mg/h via INTRAVENOUS
  Filled 2023-08-17: qty 100

## 2023-08-17 MED ORDER — GLYCOPYRROLATE 0.2 MG/ML IJ SOLN: 0.2000 mg | INTRAMUSCULAR | Status: DC | PRN

## 2023-08-17 MED ORDER — GLYCOPYRROLATE 0.2 MG/ML IJ SOLN
0.4000 mg | INTRAMUSCULAR | Status: DC | PRN
  Administered 2023-08-17: 0.4 mg via INTRAVENOUS
  Filled 2023-08-17: qty 2

## 2023-08-17 MED ORDER — MORPHINE BOLUS VIA INFUSION
2.0000 mg | INTRAVENOUS | Status: DC | PRN
  Administered 2023-08-17: 2 mg via INTRAVENOUS

## 2023-08-17 DEATH — deceased

## 2023-08-18 ENCOUNTER — Encounter

## 2023-08-19 ENCOUNTER — Encounter: Admitting: Cardiology

## 2023-08-20 ENCOUNTER — Ambulatory Visit: Payer: Medicare Other | Admitting: Internal Medicine

## 2023-08-27 NOTE — Addendum Note (Signed)
 Addended by: Edra Govern D on: 08/27/2023 11:24 AM   Modules accepted: Orders, Level of Service

## 2023-08-27 NOTE — Progress Notes (Signed)
 Remote ICD transmission.

## 2023-08-29 ENCOUNTER — Other Ambulatory Visit: Payer: Self-pay | Admitting: Internal Medicine

## 2023-08-29 DIAGNOSIS — I469 Cardiac arrest, cause unspecified: Secondary | ICD-10-CM

## 2023-08-29 DIAGNOSIS — Z95810 Presence of automatic (implantable) cardiac defibrillator: Secondary | ICD-10-CM

## 2023-08-29 DIAGNOSIS — I5022 Chronic systolic (congestive) heart failure: Secondary | ICD-10-CM

## 2023-08-29 DIAGNOSIS — I495 Sick sinus syndrome: Secondary | ICD-10-CM

## 2023-08-29 DIAGNOSIS — I428 Other cardiomyopathies: Secondary | ICD-10-CM

## 2023-09-16 NOTE — Progress Notes (Signed)
 [facility]  - [place] at [facility]   PATIENT NAME: Ethan Vega    MR#:  [id number]  DATE OF BIRTH:  1945-11-21  SUBJECTIVE:  patient laying in bed. Was restless and moaning with pain when moved by RN today. Palliative d/w dter about morphine  gtt and agreeable. Much calm and resting now  VITALS:  Blood pressure (!) 85/60, pulse 93, temperature 98 F (36.7 C), resp. rate (!) 22, height 5\' 3"  (1.6 m), weight 80.7 kg, SpO2 (!) 89%.  PHYSICAL EXAMINATION:  limited GENERAL:  78 y.o.-year-old patient with no acute distress.  LUNGS: Normal breath sounds bilaterally CARDIOVASCULAR: S1, S2 normal.    LABORATORY PANEL:  CBC Recent Labs  Lab 08/11/23 0530  WBC 7.3  HGB 9.5*  HCT 30.6*  PLT 238    Chemistries  Recent Labs  Lab 08/11/23 0530  NA 141  K 4.4  CL 107  CO2 26  GLUCOSE 100*  BUN 25*  CREATININE 1.40*  CALCIUM  8.2*  MG 2.3  AST 21  ALT 26  ALKPHOS 56  BILITOT 1.5*    Assessment and Plan Ethan Vega. is a 78 y.o. male with medical history significant for dementia, basal cell carcinoma, asthma, HTN, HLD nonischemic cardiomyopathy s/p AICD, PAF, prolonged QTc, paroxysmal A-fib who presented to the ED for evaluation of shortness of breath and cough.  Patient had a 22-day hospitalization from 4/24 - 5/16 due to septic shock complicated by GI bleed with discontinuation of his Eliquis  at discharge to rehab facility.  CXR shows cardiomegaly with vascular congestion and trace edema   # Comfort measures only Continue comfort care medications as per palliative care management Food for pleasure   # Sepsis # Aspiration pneumonia - Presented from rehab facility for progressive congested cough and shortness of breath - CXR at the facility showed evidence of consolidation    # Dysphagia  # Elevated troponin # NICM # Hx of VF arrest s/p AICD # Hx of NSVT  # Paroxysmal A-fib  # Chronic hypotension  # Dementia  # Generalized  weakness/Failure to thrive  # HLD  # GERD  # Iron deficiency anemia      Pressure Injury 08/10/23 Buttocks Right;Lateral Stage 1 -  Intact skin with non-blanchable redness of a localized area usually over a bony prominence. (Active)  08/10/23 1030  Location: Buttocks  Location Orientation: Right;Lateral  Staging: Stage 1 -  Intact skin with non-blanchable redness of a localized area usually over a bony prominence.  Wound Description (Comments):   Present on Admission: No  Dressing Type Foam - Lift dressing to assess site every shift 08/14/23  TOC for d/c planning--family not able to afford [employer] for hospice   Family communication :Pallaitive NP spoke with dter earlier today CODE STATUS: DNR/DNI   TOC for d/c planning  TOTAL TIME TAKING CARE OF THIS PATIENT: 25 minutes.  >50% time spent on counselling and coordination of care  Note: This dictation was prepared with Dragon dictation along with smaller phrase technology. Any transcriptional errors that result from this process are unintentional.  Melvinia Stager M.D    [facility]   CC: Primary care physician; Shari Daughters, MD

## 2023-09-16 NOTE — Death Summary Note (Signed)
 DEATH SUMMARY   Patient Details  Name: Ethan Vega. MRN: [id number] DOB: Sep 25, 1945 [id number], John Muzzy, MD Admission/Discharge Information   Admit Date:  Aug 20, 2023  Date of Death: Date of Death: 08/28/2023  Time of Death: Time of Death: September 04, 1225  Length of Stay: 7   Principle Cause of death: Sepsis Aspiration Pneumonia  Hospital Diagnoses: Principal Problem:   Sepsis (HCC) Active Problems:   Pneumonia due to infectious organism   Elevated troponin   Aspiration pneumonia (HCC)   Hypomagnesemia   Sepsis due to pneumonia [facility])   Hospital Course: Ethan Vega. is a 78 y.o. male with medical history significant for dementia, basal cell carcinoma, asthma, HTN, HLD nonischemic cardiomyopathy s/p AICD, PAF, prolonged QTc, paroxysmal A-fib who presented to the ED for evaluation of shortness of breath and cough.  Patient had a 22-day hospitalization from 4/24 - 5/16 due to septic shock complicated by GI bleed with discontinuation of his Eliquis  at discharge to rehab facility.  CXR shows cardiomegaly with vascular congestion and trace edema    # Comfort measures only Continue comfort care medications as per palliative care management.    # Sepsis # Aspiration pneumonia - Presented from rehab facility for progressive congested cough and shortness of breath - CXR at the facility showed evidence of consolidation    # Dysphagia  # Elevated troponin # NICM # Hx of VF arrest s/p AICD # Hx of NSVT  # Paroxysmal A-fib  # Chronic hypotension  # Dementia  # Generalized weakness/Failure to thrive  # HLD  # GERD  # Iron deficiency anemia         Pressure Injury 08/10/23 Buttocks Right;Lateral Stage 1 -  Intact skin with non-blanchable redness of a localized area usually over a bony prominence. (Active)  08/10/23 1030  Location: Buttocks  Location Orientation: Right;Lateral  Staging: Stage 1 -  Intact skin with non-blanchable redness of a localized area usually over a bony  prominence.  Wound Description (Comments):   Present on Admission: No  Dressing Type Foam - Lift dressing to assess site every shift 08/25/23   Patient died on 06-12-25at above time.       The results of significant diagnostics from this hospitalization (including imaging, microbiology, ancillary and laboratory) are listed below for reference.   Significant Diagnostic Studies: DG Chest Port 1 View Result Date: 08/10/2023 CLINICAL DATA:  Cough after possible aspiration. EXAM: PORTABLE CHEST 1 VIEW COMPARISON:  08-20-23 FINDINGS: Left-sided pacemaker unchanged. Lungs are hypoinflated without focal airspace consolidation or effusion. Mild stable cardiomegaly. Remainder of the exam is unchanged. IMPRESSION: 1. No active disease. 2. Mild stable cardiomegaly. Electronically Signed   By: Roda Cirri M.D.   On: 08/10/2023 10:11   DG Chest 1 View Result Date: Aug 20, 2023 CLINICAL DATA:  Cough EXAM: CHEST  1 VIEW COMPARISON:  X-ray 07/12/2023 and older FINDINGS: Underinflation. Enlarged cardiopericardial silhouette. Calcified tortuous aorta. Vascular congestion. Question trace edema. Left upper chest battery pack with defibrillator leads along the right side of the heart. No pneumothorax. Overlapping cardiac leads. IMPRESSION: Defibrillator. Enlarged heart with vascular congestion. Question trace edema with some subtle Kerley B-lines. Electronically Signed   By: Adrianna Horde M.D.   On: 2023-08-20 15:35   US  Venous Img Lower Bilateral (DVT) Result Date: 07/26/2023 CLINICAL DATA:  Bilateral lower extremity edema EXAM: BILATERAL LOWER EXTREMITY VENOUS DOPPLER ULTRASOUND TECHNIQUE: Gray-scale sonography with graded compression, as well as color Doppler and duplex ultrasound were performed to evaluate  the lower extremity deep venous systems from the level of the common femoral vein and including the common femoral, femoral, profunda femoral, popliteal and calf veins including the posterior tibial, peroneal  and gastrocnemius veins when visible. The superficial great saphenous vein was also interrogated. Spectral Doppler was utilized to evaluate flow at rest and with distal augmentation maneuvers in the common femoral, femoral and popliteal veins. COMPARISON:  None Available. FINDINGS: RIGHT LOWER EXTREMITY Common Femoral Vein: No evidence of thrombus. Normal compressibility, respiratory phasicity and response to augmentation. Saphenofemoral Junction: No evidence of thrombus. Normal compressibility and flow on color Doppler imaging. Profunda Femoral Vein: No evidence of thrombus. Normal compressibility and flow on color Doppler imaging. Femoral Vein: No evidence of thrombus. Normal compressibility, respiratory phasicity and response to augmentation. Popliteal Vein: No evidence of thrombus. Normal compressibility, respiratory phasicity and response to augmentation. Calf Veins: No evidence of thrombus. Normal compressibility and flow on color Doppler imaging. Superficial Great Saphenous Vein: No evidence of thrombus. Normal compressibility. Venous Reflux:  None. Other Findings:  None. LEFT LOWER EXTREMITY Common Femoral Vein: No evidence of thrombus. Normal compressibility, respiratory phasicity and response to augmentation. Saphenofemoral Junction: No evidence of thrombus. Normal compressibility and flow on color Doppler imaging. Profunda Femoral Vein: No evidence of thrombus. Normal compressibility and flow on color Doppler imaging. Femoral Vein: No evidence of thrombus. Normal compressibility, respiratory phasicity and response to augmentation. Popliteal Vein: No evidence of thrombus. Normal compressibility, respiratory phasicity and response to augmentation. Calf Veins: No evidence of thrombus. Normal compressibility and flow on color Doppler imaging. Superficial Great Saphenous Vein: No evidence of thrombus. Normal compressibility. Venous Reflux:  None. Other Findings:  None. IMPRESSION: No evidence of deep venous  thrombosis in either lower extremity. Electronically Signed   By: Fernando Hoyer M.D.   On: 07/26/2023 06:49   CT ANGIO GI BLEED Result Date: 07/26/2023 CLINICAL DATA:  BRIGHT RED BLOOD PER RECTUM EXAM: CTA ABDOMEN AND PELVIS WITHOUT AND WITH CONTRAST TECHNIQUE: Multidetector CT imaging of the abdomen and pelvis was performed using the standard protocol during bolus administration of intravenous contrast. Multiplanar reconstructed images and MIPs were obtained and reviewed to evaluate the vascular anatomy. RADIATION DOSE REDUCTION: This exam was performed according to the departmental dose-optimization program which includes automated exposure control, adjustment of the mA and/or kV according to patient size and/or use of iterative reconstruction technique. CONTRAST:  75mL OMNIPAQUE  IOHEXOL  350 MG/ML SOLN COMPARISON:  CT abdomen pelvis 07/10/2023 FINDINGS: VASCULAR No acute gastrointestinal hemorrhage within the small or large bowel lumen. Hyperdensity noted layering within the gastric lumen and large bowel noted on all 3 phases, noncontrast, arterial, venous. Aorta: Severe atherosclerotic plaque. Normal caliber aorta without aneurysm, dissection, vasculitis or significant stenosis. Celiac: Patent without evidence of aneurysm, dissection, vasculitis or significant stenosis. SMA: Patent without evidence of aneurysm, dissection, vasculitis or significant stenosis. Renals: Moderate atherosclerotic plaque. Both renal arteries are patent without evidence of aneurysm, dissection, vasculitis, fibromuscular dysplasia or significant stenosis. IMA: Patent without evidence of aneurysm, dissection, vasculitis or significant stenosis. Inflow: Patent without evidence of aneurysm, dissection, vasculitis or significant stenosis. Proximal Outflow: Bilateral common femoral and visualized portions of the superficial and profunda femoral arteries are patent without evidence of aneurysm, dissection, vasculitis or significant  stenosis. Veins: No obvious venous abnormality within the limitations of this arterial phase study. Review of the MIP images confirms the above findings. NON-VASCULAR Lower chest: Partially visualized cardiac leads. Four-vessel coronary artery calcification. Enlarged left ventricle. Trace pericardial effusion. Hepatobiliary: No focal liver  abnormality. Calcified gallstone noted within the gallbladder lumen. No gallbladder wall thickening or pericholecystic fluid. No biliary dilatation. Pancreas: No focal lesion. Normal pancreatic contour. No surrounding inflammatory changes. No main pancreatic ductal dilatation. Spleen: Normal in size without focal abnormality. Adrenals/Urinary Tract: No adrenal nodule bilaterally. Bilateral kidneys enhance symmetrically. No hydronephrosis. No hydroureter. Punctate right nephrolithiasis. No left nephrolithiasis. No ureterolithiasis bilaterally. The urinary bladder is unremarkable. Stomach/Bowel: Stomach is within normal limits. No evidence of bowel wall thickening or dilatation. Appendix appears normal. Lymphatic: No lymphadenopathy. Reproductive: Prostate is unremarkable. Other: No intraperitoneal free fluid. No intraperitoneal free gas. No organized fluid collection. Musculoskeletal: Right inguinal hernia contains portions of a normal appearing appendix as well as associated fat. Small fat containing left inguinal hernia. Left lateral abdomen/flank subcutaneus soft tissue edema. No suspicious lytic or blastic osseous lesions. No acute displaced fracture. Multilevel degenerative changes of the spine. IMPRESSION: VASCULAR 1. No CT evidence of acute gastrointestinal hemorrhage. NON-VASCULAR 1. Right inguinal hernia contains portions of a normal appearing appendix as well as associated fat. 2. Small fat containing left inguinal hernia 3. Nonobstructive punctate right nephrolithiasis. 4. Cholelithiasis with no CT evidence of acute cholecystitis. Electronically Signed   By: Morgane   Naveau M.D.   On: 07/26/2023 02:15    Microbiology: Recent Results (from the past 240 hours)  Resp panel by RT-PCR (RSV, Flu A&B, Covid) Anterior Nasal Swab     Status: None   Collection Time: 08/09/23  3:11 PM   Specimen: Anterior Nasal Swab  Result Value Ref Range Status   SARS Coronavirus 2 by RT PCR NEGATIVE NEGATIVE Final    Comment: (NOTE) SARS-CoV-2 target nucleic acids are NOT DETECTED.  The SARS-CoV-2 RNA is generally detectable in upper respiratory specimens during the acute phase of infection. The lowest concentration of SARS-CoV-2 viral copies this assay can detect is 138 copies/mL. A negative result does not preclude SARS-Cov-2 infection and should not be used as the sole basis for treatment or other patient management decisions. A negative result may occur with  improper specimen collection/handling, submission of specimen other than nasopharyngeal swab, presence of viral mutation(s) within the areas targeted by this assay, and inadequate number of viral copies(<138 copies/mL). A negative result must be combined with clinical observations, patient history, and epidemiological information. The expected result is Negative.  Fact Sheet for Patients:  BloggerCourse.com  Fact Sheet for Healthcare Providers:  SeriousBroker.it  This test is no t yet approved or cleared by the [place]  FDA and  has been authorized for detection and/or diagnosis of SARS-CoV-2 by FDA under an Emergency Use Authorization (EUA). This EUA will remain  in effect (meaning this test can be used) for the duration of the COVID-19 declaration under Section 564(b)(1) of the Act, 21 U.S.C.section 360bbb-3(b)(1), unless the authorization is terminated  or revoked sooner.       Influenza A by PCR NEGATIVE NEGATIVE Final   Influenza B by PCR NEGATIVE NEGATIVE Final    Comment: (NOTE) The Xpert Xpress SARS-CoV-2/FLU/RSV plus assay is intended as  an aid in the diagnosis of influenza from Nasopharyngeal swab specimens and should not be used as a sole basis for treatment. Nasal washings and aspirates are unacceptable for Xpert Xpress SARS-CoV-2/FLU/RSV testing.  Fact Sheet for Patients: BloggerCourse.com  Fact Sheet for Healthcare Providers: SeriousBroker.it  This test is not yet approved or cleared by the [place]  FDA and has been authorized for detection and/or diagnosis of SARS-CoV-2 by FDA under an Emergency Use Authorization (EUA). This EUA  will remain in effect (meaning this test can be used) for the duration of the COVID-19 declaration under Section 564(b)(1) of the Act, 21 U.S.C. section 360bbb-3(b)(1), unless the authorization is terminated or revoked.     Resp Syncytial Virus by PCR NEGATIVE NEGATIVE Final    Comment: (NOTE) Fact Sheet for Patients: BloggerCourse.com  Fact Sheet for Healthcare Providers: SeriousBroker.it  This test is not yet approved or cleared by the [place]  FDA and has been authorized for detection and/or diagnosis of SARS-CoV-2 by FDA under an Emergency Use Authorization (EUA). This EUA will remain in effect (meaning this test can be used) for the duration of the COVID-19 declaration under Section 564(b)(1) of the Act, 21 U.S.C. section 360bbb-3(b)(1), unless the authorization is terminated or revoked.  Performed at [facility], [address] [place], [place] [address]   Culture, blood (routine x 2)     Status: None   Collection Time: 08/09/23  3:11 PM   Specimen: BLOOD  Result Value Ref Range Status   Specimen Description BLOOD RIGHT ANTECUBITAL  Final   Special Requests   Final    BOTTLES DRAWN AEROBIC ONLY Blood Culture results may not be optimal due to an inadequate volume of blood received in culture bottles   Culture   Final    NO GROWTH 5 DAYS Performed  at [facility], [address], [place], [place] [address]    Report Status 08/14/2023 FINAL  Final  Culture, blood (routine x 2)     Status: None   Collection Time: 08/09/23  3:28 PM   Specimen: BLOOD  Result Value Ref Range Status   Specimen Description BLOOD BLOOD RIGHT HAND  Final   Special Requests   Final    BOTTLES DRAWN AEROBIC AND ANAEROBIC Blood Culture adequate volume   Culture   Final    NO GROWTH 5 DAYS Performed at [facility], [address] [place], [place] [address]    Report Status 08/14/2023 FINAL  Final  Culture, Respiratory w Gram Stain     Status: None   Collection Time: 08/10/23  1:25 PM   Specimen: INDUCED SPUTUM  Result Value Ref Range Status   Specimen Description   Final    INDUCED SPUTUM Performed at [facility], [address] [place], [place] [address]    Special Requests   Final    NONE Performed at [facility], [address] [place], [place] [address]    Gram Stain   Final    NO SQUAMOUS EPITHELIAL CELLS SEEN WBC PRESENT, PREDOMINANTLY PMN NO ORGANISMS SEEN Performed at [facility], [address], [place], [place] [address]    Culture FEW STAPHYLOCOCCUS AUREUS  Final   Report Status 08/12/2023 FINAL  Final   Organism ID, Bacteria STAPHYLOCOCCUS AUREUS  Final      Susceptibility   Staphylococcus aureus - MIC*    CIPROFLOXACIN <=0.5 SENSITIVE Sensitive     ERYTHROMYCIN <=0.25 SENSITIVE Sensitive     GENTAMICIN  <=0.5 SENSITIVE Sensitive     OXACILLIN 0.5 SENSITIVE Sensitive     TETRACYCLINE <=1 SENSITIVE Sensitive     VANCOMYCIN  <=0.5 SENSITIVE Sensitive     TRIMETH/SULFA <=10 SENSITIVE Sensitive     CLINDAMYCIN <=0.25 SENSITIVE Sensitive     RIFAMPIN <=0.5 SENSITIVE Sensitive     Inducible Clindamycin NEGATIVE Sensitive     LINEZOLID 2 SENSITIVE Sensitive     * FEW STAPHYLOCOCCUS AUREUS  MRSA Next Gen by PCR, Nasal  Status: None   Collection Time: 08/10/23  1:44 PM   Specimen:  Nasal Mucosa; Nasal Swab  Result Value Ref Range Status   MRSA by PCR Next Gen NOT DETECTED NOT DETECTED Final    Comment: (NOTE) The GeneXpert MRSA Assay (FDA approved for NASAL specimens only), is one component of a comprehensive MRSA colonization surveillance program. It is not intended to diagnose MRSA infection nor to guide or monitor treatment for MRSA infections. Test performance is not FDA approved in patients less than 60 years old. Performed at [facility], [address], [place], [place] [address]     Time spent: 25 minutes  Signed: Melvinia Stager, MD 08/19/23

## 2023-09-16 NOTE — Progress Notes (Signed)
 Patient with labored breathing, and difficulty with secretions. Patient yelling out in pain when any movement occurs. Medicated per MAR. Attending MD and palliative provider notified of potential need for morphine  gtt.

## 2023-09-16 NOTE — Progress Notes (Signed)
 Palliative Care Progress Note, Assessment & Plan   Patient Name: Ethan Vega.       Date: 08/18/2023 DOB: 04/27/1945  Age: 78 y.o. MRN#: [id number] Attending Physician: Melvinia Stager, MD Primary Care Physician: Shari Daughters, MD Admit Date: 08/09/2023  Subjective: Unable to assess  HPI: 78 y.o. male  with past medical history significant for dementia, basal cell carcinoma, asthma, HTN, HLD nonischemic cardiomyopathy s/p AICD, PAF, prolonged QTc and paroxysmal A-fib. He presented from facility via EMS 08/09/23 with c/o shortness of breath and cough. EMS reported on scene, patient had O2 sat in low 90s. Facility staff reported to EMS suspicion of aspirating while eating lunch. Since arriving at facility, he has had persistent, occasionally productive cough. CXR at facility showed LLL consolidations.   On arrival to ED, BP 108/61, HR 110, O2 95% RA, RR 21, temp 98.7. ED labs showed Na+ 141, K+ 4.1, Creatinine 1.20, WBC 15.0, Hgb 10.9, albumin 2.9, troponin 363-> 445, lactic 3.2-> 3.0, BNP 3948. Covid/flu/RSV negative. CXR showed cardiomegaly with vascular congestion and trace edema.  He was admitted for further evaluation and management of sepsis, aspiration PNA and elevated troponin.    Of note, he recently had 22 day hospitalization from 4/24-5/16/25 for septic shock, AKI and GIB with discontinuation of Eliquis . He was subsequently transferred to [facility] for rehabilitation.     PMT was consulted for goals of care conversation.  Mr. Haroon is well-known to PMT from previous admission and having numerous goals of care conversations with the patient and his daughter, Dinkins.    5/26: Patient was transitioned to comfort care.  Summary of counseling/coordination of care: Extensive chart review  completed prior to meeting patient including labs, vital signs, imaging, progress notes, orders, and available advanced directive documents from current and previous encounters.   Received message from day shift RN, Tiffanie, that patient was agitated and yelling out in pain when repositioned. RN requesting to start morphine  gtt for better symptom control. Assessed patient at bedside with RN. At that time, patient was resting comfortably as he had just received PRN medications for pain/discomfort.  Called patient's daughter, Staubs, and discussed recommendation for starting morphine  drip to allow slow, continuous medication to ensure he is receiving adequate symptom control avoiding episodes of agitation and discomfort. She is in agreement to proceed with morphine  drip.    Elderly, ill-appearing male resting in bed. He is not responsive to verbal, tactile stimuli. Even, unlabored respirations. No UOP in container or documented over past 24 hours. Lower extremities are cold to touch. He is in no distress.   Therapeutic silence and active listening provided for Robinette to share her thoughts and emotions regarding current medical situation.  Emotional support provided.  Physical Exam Vitals reviewed.  Constitutional:      General: He is not in acute distress.    Appearance: He is ill-appearing.  HENT:     Head: Normocephalic and atraumatic.     Mouth/Throat:     Mouth: Mucous membranes are dry.  Pulmonary:     Effort: Pulmonary effort is normal. No respiratory distress.  Musculoskeletal:     Right lower leg: No edema.     Left lower  leg: No edema.     Comments: Lower extremities are cold to touch  Skin:    General: Skin is dry.     Coloration: Skin is pale.     Recommendations/Plan:         Focus of care is comfort and dignity allowing for natural death Utilize ordered medications to maintain comfort Palliative to follow for symptom management  Anticipate hospital death         Addendum  09/06/27: Notified by primary RN that patient passed, TOD 1227. RN notified daughter via phone.    Total Time 50 minutes   Time spent includes: Detailed review of medical records (labs, imaging, vital signs), medically appropriate exam (mental status, respiratory, cardiac, skin), discussed with treatment team, counseling and educating patient, family and staff, documenting clinical information, medication management and coordination of care.     Ina Manas, Joyice Nodal- [facility] Palliative Medicine Team  09/09/2023 9:06 AM  Office [phone]  Pager [phone]

## 2023-09-16 NOTE — Plan of Care (Signed)

## 2023-09-16 NOTE — Progress Notes (Signed)
 Patient noted to be visibly anxious following getting cleaned up and repositioned. Patient medicated per MAR.

## 2023-09-16 DEATH — deceased

## 2023-10-09 ENCOUNTER — Encounter: Admitting: Internal Medicine

## 2023-10-09 ENCOUNTER — Encounter: Admitting: Cardiology

## 2023-10-27 ENCOUNTER — Ambulatory Visit: Payer: Medicare Other

## 2024-01-26 ENCOUNTER — Ambulatory Visit: Payer: Medicare Other
